# Patient Record
Sex: Female | Born: 1940 | Race: White | Hispanic: No | Smoking: Former smoker
Health system: Southern US, Community
[De-identification: ages and names within clinical notes are randomized; demographics above are authoritative.]

## PROBLEM LIST (undated history)

## (undated) DIAGNOSIS — F322 Major depressive disorder, single episode, severe without psychotic features: Secondary | ICD-10-CM

## (undated) DIAGNOSIS — T4145XA Adverse effect of unspecified anesthetic, initial encounter: Secondary | ICD-10-CM

## (undated) DIAGNOSIS — Z5189 Encounter for other specified aftercare: Secondary | ICD-10-CM

## (undated) DIAGNOSIS — M1990 Unspecified osteoarthritis, unspecified site: Secondary | ICD-10-CM

## (undated) DIAGNOSIS — K219 Gastro-esophageal reflux disease without esophagitis: Secondary | ICD-10-CM

## (undated) DIAGNOSIS — T8859XA Other complications of anesthesia, initial encounter: Secondary | ICD-10-CM

## (undated) DIAGNOSIS — IMO0001 Reserved for inherently not codable concepts without codable children: Secondary | ICD-10-CM

## (undated) DIAGNOSIS — G510 Bell's palsy: Secondary | ICD-10-CM

## (undated) DIAGNOSIS — K635 Polyp of colon: Secondary | ICD-10-CM

## (undated) DIAGNOSIS — T7840XA Allergy, unspecified, initial encounter: Secondary | ICD-10-CM

## (undated) DIAGNOSIS — E46 Unspecified protein-calorie malnutrition: Secondary | ICD-10-CM

## (undated) DIAGNOSIS — K5010 Crohn's disease of large intestine without complications: Secondary | ICD-10-CM

## (undated) DIAGNOSIS — H269 Unspecified cataract: Secondary | ICD-10-CM

## (undated) DIAGNOSIS — I10 Essential (primary) hypertension: Secondary | ICD-10-CM

## (undated) DIAGNOSIS — R0602 Shortness of breath: Secondary | ICD-10-CM

## (undated) DIAGNOSIS — H04123 Dry eye syndrome of bilateral lacrimal glands: Secondary | ICD-10-CM

## (undated) DIAGNOSIS — R51 Headache: Secondary | ICD-10-CM

## (undated) HISTORY — DX: Dry eye syndrome of bilateral lacrimal glands: H04.123

## (undated) HISTORY — PX: TONSILLECTOMY: SUR1361

## (undated) HISTORY — PX: FOOT GANGLION EXCISION: SHX1660

## (undated) HISTORY — DX: Unspecified cataract: H26.9

## (undated) HISTORY — PX: CATARACT EXTRACTION: SUR2

## (undated) HISTORY — PX: ABDOMINAL HYSTERECTOMY: SHX81

## (undated) HISTORY — DX: Gastro-esophageal reflux disease without esophagitis: K21.9

## (undated) HISTORY — DX: Bell's palsy: G51.0

## (undated) HISTORY — PX: CHOLECYSTECTOMY: SHX55

## (undated) HISTORY — DX: Allergy, unspecified, initial encounter: T78.40XA

## (undated) HISTORY — PX: COLONOSCOPY: SHX174

## (undated) HISTORY — DX: Crohn's disease of large intestine without complications: K50.10

## (undated) HISTORY — PX: SHOULDER SURGERY: SHX246

## (undated) HISTORY — DX: Encounter for other specified aftercare: Z51.89

## (undated) HISTORY — PX: APPENDECTOMY: SHX54

---
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
## 1998-01-14 ENCOUNTER — Other Ambulatory Visit: Admission: RE | Admit: 1998-01-14 | Discharge: 1998-01-14 | Payer: Self-pay

## 1999-07-12 ENCOUNTER — Encounter: Admission: RE | Admit: 1999-07-12 | Discharge: 1999-07-12 | Payer: Self-pay | Admitting: Otolaryngology

## 1999-07-12 ENCOUNTER — Encounter: Payer: Self-pay | Admitting: Otolaryngology

## 1999-07-13 ENCOUNTER — Encounter (INDEPENDENT_AMBULATORY_CARE_PROVIDER_SITE_OTHER): Payer: Self-pay | Admitting: *Deleted

## 1999-07-13 ENCOUNTER — Other Ambulatory Visit: Admission: RE | Admit: 1999-07-13 | Discharge: 1999-07-13 | Payer: Self-pay | Admitting: Otolaryngology

## 2001-11-21 ENCOUNTER — Encounter: Payer: Self-pay | Admitting: Interventional Cardiology

## 2001-11-21 ENCOUNTER — Inpatient Hospital Stay (HOSPITAL_COMMUNITY): Admission: EM | Admit: 2001-11-21 | Discharge: 2001-11-22 | Payer: Self-pay | Admitting: Emergency Medicine

## 2002-01-28 ENCOUNTER — Encounter: Payer: Self-pay | Admitting: Family Medicine

## 2002-01-28 ENCOUNTER — Encounter: Admission: RE | Admit: 2002-01-28 | Discharge: 2002-01-28 | Payer: Self-pay | Admitting: Family Medicine

## 2002-12-19 ENCOUNTER — Encounter: Admission: RE | Admit: 2002-12-19 | Discharge: 2002-12-19 | Payer: Self-pay | Admitting: Orthopedic Surgery

## 2002-12-19 ENCOUNTER — Encounter: Payer: Self-pay | Admitting: Orthopedic Surgery

## 2003-07-14 ENCOUNTER — Emergency Department (HOSPITAL_COMMUNITY): Admission: EM | Admit: 2003-07-14 | Discharge: 2003-07-14 | Payer: Self-pay | Admitting: Emergency Medicine

## 2003-09-15 ENCOUNTER — Encounter: Payer: Self-pay | Admitting: Cardiology

## 2003-09-15 ENCOUNTER — Ambulatory Visit (HOSPITAL_COMMUNITY): Admission: RE | Admit: 2003-09-15 | Discharge: 2003-09-15 | Payer: Self-pay | Admitting: Neurology

## 2003-09-29 ENCOUNTER — Ambulatory Visit (HOSPITAL_COMMUNITY): Admission: RE | Admit: 2003-09-29 | Discharge: 2003-09-29 | Payer: Self-pay | Admitting: Neurology

## 2004-09-29 ENCOUNTER — Emergency Department (HOSPITAL_COMMUNITY): Admission: EM | Admit: 2004-09-29 | Discharge: 2004-09-29 | Payer: Self-pay | Admitting: Emergency Medicine

## 2005-05-03 ENCOUNTER — Encounter: Admission: RE | Admit: 2005-05-03 | Discharge: 2005-05-03 | Payer: Self-pay | Admitting: Orthopedic Surgery

## 2005-05-17 ENCOUNTER — Encounter: Admission: RE | Admit: 2005-05-17 | Discharge: 2005-05-17 | Payer: Self-pay | Admitting: Orthopedic Surgery

## 2005-05-31 ENCOUNTER — Encounter: Admission: RE | Admit: 2005-05-31 | Discharge: 2005-05-31 | Payer: Self-pay | Admitting: Orthopedic Surgery

## 2006-01-04 ENCOUNTER — Emergency Department (HOSPITAL_COMMUNITY): Admission: EM | Admit: 2006-01-04 | Discharge: 2006-01-04 | Payer: Self-pay | Admitting: Emergency Medicine

## 2006-12-19 ENCOUNTER — Encounter: Admission: RE | Admit: 2006-12-19 | Discharge: 2006-12-19 | Payer: Self-pay | Admitting: Orthopedic Surgery

## 2007-01-02 ENCOUNTER — Encounter: Admission: RE | Admit: 2007-01-02 | Discharge: 2007-01-02 | Payer: Self-pay | Admitting: Orthopedic Surgery

## 2007-01-16 ENCOUNTER — Encounter: Admission: RE | Admit: 2007-01-16 | Discharge: 2007-01-16 | Payer: Self-pay | Admitting: Orthopedic Surgery

## 2008-06-26 ENCOUNTER — Encounter: Admission: RE | Admit: 2008-06-26 | Discharge: 2008-06-26 | Payer: Self-pay | Admitting: Orthopedic Surgery

## 2009-08-31 ENCOUNTER — Ambulatory Visit (HOSPITAL_COMMUNITY): Admission: RE | Admit: 2009-08-31 | Discharge: 2009-08-31 | Payer: Self-pay | Admitting: General Practice

## 2010-07-02 ENCOUNTER — Inpatient Hospital Stay (HOSPITAL_COMMUNITY)
Admission: EM | Admit: 2010-07-02 | Discharge: 2010-07-04 | Payer: Self-pay | Source: Home / Self Care | Attending: Internal Medicine | Admitting: Internal Medicine

## 2010-10-02 LAB — CBC
MCH: 30.9 pg (ref 26.0–34.0)
MCHC: 33.4 g/dL (ref 30.0–36.0)
MCHC: 34.4 g/dL (ref 30.0–36.0)
Platelets: 286 10*3/uL (ref 150–400)
Platelets: 299 10*3/uL (ref 150–400)
RDW: 12.4 % (ref 11.5–15.5)
RDW: 12.7 % (ref 11.5–15.5)
WBC: 6.9 10*3/uL (ref 4.0–10.5)

## 2010-10-02 LAB — COMPREHENSIVE METABOLIC PANEL
ALT: 66 U/L — ABNORMAL HIGH (ref 0–35)
AST: 46 U/L — ABNORMAL HIGH (ref 0–37)
AST: 59 U/L — ABNORMAL HIGH (ref 0–37)
Albumin: 3.1 g/dL — ABNORMAL LOW (ref 3.5–5.2)
Albumin: 3.6 g/dL (ref 3.5–5.2)
Alkaline Phosphatase: 78 U/L (ref 39–117)
BUN: 12 mg/dL (ref 6–23)
CO2: 24 mEq/L (ref 19–32)
Calcium: 8.9 mg/dL (ref 8.4–10.5)
Calcium: 9.6 mg/dL (ref 8.4–10.5)
Chloride: 96 mEq/L (ref 96–112)
Creatinine, Ser: 1.03 mg/dL (ref 0.4–1.2)
Creatinine, Ser: 1.07 mg/dL (ref 0.4–1.2)
GFR calc Af Amer: >60 mL/min (ref >60)
GFR calc Af Amer: >60 mL/min (ref >60)
GFR calc non Af Amer: 51 mL/min — ABNORMAL LOW (ref >60)
GFR calc non Af Amer: 53 mL/min — ABNORMAL LOW (ref >60)
Glucose, Bld: 106 mg/dL — ABNORMAL HIGH (ref 70–99)
Potassium: 3.8 mEq/L (ref 3.5–5.1)
Sodium: 130 mEq/L — ABNORMAL LOW (ref 135–145)
Total Bilirubin: 0.7 mg/dL (ref 0.3–1.2)
Total Protein: 6.2 g/dL (ref 6.0–8.3)
Total Protein: 7 g/dL (ref 6.0–8.3)

## 2010-10-02 LAB — CARDIAC PANEL(CRET KIN+CKTOT+MB+TROPI)
CK, MB: 0.8 ng/mL (ref 0.3–4.0)
Relative Index: 0.2 (ref 0.0–2.5)
Relative Index: 0.2 (ref 0.0–2.5)
Total CK: 437 U/L — ABNORMAL HIGH (ref 7–177)
Total CK: 462 U/L — ABNORMAL HIGH (ref 7–177)
Troponin I: 0.02 ng/mL (ref 0.00–0.06)
Troponin I: 0.03 ng/mL (ref 0.00–0.06)

## 2010-10-02 LAB — BASIC METABOLIC PANEL
BUN: 10 mg/dL (ref 6–23)
Chloride: 98 mEq/L (ref 96–112)
GFR calc non Af Amer: 60 mL/min — ABNORMAL LOW (ref >60)
Potassium: 3.3 mEq/L — ABNORMAL LOW (ref 3.5–5.1)
Sodium: 131 mEq/L — ABNORMAL LOW (ref 135–145)

## 2010-10-02 LAB — SYNOVIAL CELL COUNT + DIFF, W/ CRYSTALS
Crystals, Fluid: NONE SEEN
Eosinophils-Synovial: 0 % (ref 0–1)
Lymphocytes-Synovial Fld: 1 % (ref 0–20)
Monocyte-Macrophage-Synovial Fluid: 26 % — ABNORMAL LOW (ref 50–90)
WBC, Synovial: 2170 /mm3 — ABNORMAL HIGH (ref 0–200)

## 2010-10-02 LAB — D-DIMER, QUANTITATIVE: D-Dimer, Quant: 0.39 ug/mL-FEU (ref 0.00–0.48)

## 2010-10-02 LAB — CULTURE, BLOOD (ROUTINE X 2): Culture: NO GROWTH

## 2010-10-02 LAB — CK TOTAL AND CKMB (NOT AT ARMC)
CK, MB: 0.8 ng/mL (ref 0.3–4.0)
Relative Index: 0.2 (ref 0.0–2.5)
Total CK: 488 U/L — ABNORMAL HIGH (ref 7–177)

## 2010-10-02 LAB — BODY FLUID CULTURE

## 2010-10-02 LAB — BRAIN NATRIURETIC PEPTIDE: Pro B Natriuretic peptide (BNP): 37 pg/mL (ref 0.0–100.0)

## 2010-10-02 LAB — POCT CARDIAC MARKERS
CKMB, poc: <1 ng/mL — ABNORMAL LOW (ref 1.0–8.0)
Myoglobin, poc: 59.9 ng/mL (ref 12–200)
Troponin i, poc: <0.05 ng/mL (ref 0.00–0.09)

## 2010-10-02 LAB — HEPATITIS B DNA, ULTRAQUANTITATIVE, PCR

## 2010-10-02 LAB — ANAEROBIC CULTURE

## 2010-10-02 LAB — TSH: TSH: 1.049 u[IU]/mL (ref 0.350–4.500)

## 2010-10-02 LAB — HEMOGLOBIN A1C
Hgb A1c MFr Bld: 6 % — ABNORMAL HIGH (ref <5.7)
Mean Plasma Glucose: 126 mg/dL — ABNORMAL HIGH (ref <117)

## 2010-10-02 LAB — GRAM STAIN

## 2010-10-02 LAB — DIFFERENTIAL
Basophils Absolute: 0 10*3/uL (ref 0.0–0.1)
Basophils Relative: 0 % (ref 0–1)
Lymphocytes Relative: 24 % (ref 12–46)
Monocytes Absolute: 0.9 10*3/uL (ref 0.1–1.0)
Neutro Abs: 4.2 10*3/uL (ref 1.7–7.7)
Neutrophils Relative %: 61 % (ref 43–77)

## 2010-10-02 LAB — HEPATITIS PANEL, ACUTE
HCV Ab: NEGATIVE
Hep A IgM: NEGATIVE

## 2010-10-02 LAB — LIPID PANEL
Triglycerides: 86 mg/dL (ref <150)
VLDL: 17 mg/dL (ref 0–40)

## 2010-10-02 LAB — PROTIME-INR
INR: 0.99 (ref 0.00–1.49)
Prothrombin Time: 13.3 seconds (ref 11.6–15.2)

## 2010-10-02 LAB — TROPONIN I: Troponin I: 0.01 ng/mL (ref 0.00–0.06)

## 2010-10-02 LAB — LIPASE, BLOOD: Lipase: 39 U/L (ref 11–59)

## 2010-10-02 LAB — APTT: aPTT: 31 seconds (ref 24–37)

## 2010-12-08 NOTE — H&P (Signed)
[place]  Patient:    [name] [name], [name] [name] Visit Number: [id number] MRN: [id number]          Service Type: MED Location: [id number] Attending Physician:  Rolland Porter Dictated by:   Illene Labrador, M.D. Admit Date:  11/21/2001   CC:         Gus Height, M.D.   History and Physical  INDICATIONS:  Chest pain.  SUBJECTIVE:  The patient is a 70 year old who is followed at [facility] by Dr. Sheryn Bison.  She was seen today by Dr. Melinda Crutch.  The patient has a history of recurrent chest and neck discomfort over the past 10 years.  She states that she gets episodes of severe substernal tightness two to four times per year.  The episode today started in the early morning and lasted until around 4 p.m., when she saw Dr. Harrington Challenger.  After being seen by Dr. Harrington Challenger an EKG was done and was normal.  Because of elevated blood pressure and continued chest pain, nitroglycerin was given and there was some lessening of the discomfort.  She was sent to the emergency room for further evaluation. She is now pain-free.  Repeat EKG remains normal.  ALLERGIES:  CODEINE causes nausea.  PAST MEDICAL HISTORY: 1. Arthritis of uncertain type. 2. No other significant medical problems.  She specifically denies diabetes, hypertension.  FAMILY HISTORY:  Father died of a myocardial infarction at age 19.  She has a younger brother who has suffered a myocardial infarction.  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 She has one son.  HABITS:  She does not smoke or drink.  MEDICATIONS:  Celebrex for arthritis - she does not remember the dose.  She is on no other medication other than vitamins.  REVIEW OF SYSTEMS:  She denies back discomfort.  There is no history of neurological ailments.  She denies intestinal problems.  No diagnosis of gastroesophageal reflux.  She has never had cholecystectomy.  OBJECTIVE:  GENERAL:   On exam, the patient is in no distress.  She is sitting comfortably on the gurney.  Her skin color is good.  VITAL SIGNS:  Respirations 16, heart rate 60.  Blood pressure initially 210/100; subsequently her blood pressure is 170/92.  HEENT:  Reveals no xanthelasma.  Extraocular movements are full.  No jaundice.  NECK:  Reveals no JVD, carotid bruits, or thyromegaly.  CHEST:  Clear to auscultation and percussion.  CARDIAC:  Reveals an S4 gallop but otherwise unremarkable.  No murmur is heard.  The precordium is relatively quiet.  ABDOMEN:  Soft.  The liver edge is not palpable.  No pulsatile masses are noted.  Bowel sounds are normal.  EXTREMITIES:  Reveal no edema.  Radial pulses, femoral pulses, and posterior tibial pulses are symmetric and 2+.  There is no peripheral edema.  NEUROLOGIC:  Reveals the patient is alert and oriented x3.  No motor or sensory deficits noted.  No obvious cranial nerves abnormalities are noted.  LABORATORY DATA:  EKG reveals an incomplete right bundle-branch block, normal sinus rhythm.  This pattern is present on both the [facility] ER tracings.  Chest x-ray reveals upper normal heart size but is otherwise unremarkable.  No laboratory data is back at this time.  ASSESSMENT: 1. Recurrent chest pain, frequently occurring with anxiety and stress.  This    discomfort is not precipitated by physical activity.  Etiology is    uncertain.  Myocardial infarction will be ruled out.  Given the chronicity    and the recurrent nature of this discomfort I doubt that this is cardiac. 2. Hypertension is present on admission.  This needs to be further evaluated.  PLAN: 1. Lopressor 25 mg p.o. b.i.d. 2. Lovenox subcu. 3. Serial enzymes and EKGs, rule out infarction. 4. Based upon blood pressure recordings, further adjustments and medications;    perhaps adding a low-dose diuretic will be necessary. 5. Once blood pressure controlled and if  the patient rules out for myocardial    infarction, she will likely be discharged and have an outpatient stress    test. Dictated by:   Illene Labrador, M.D. Attending Physician:  Rolland Porter DD:  11/21/01 TD:  11/21/01 Job: [id number]

## 2011-06-01 ENCOUNTER — Encounter: Payer: Self-pay | Admitting: Emergency Medicine

## 2011-06-01 ENCOUNTER — Emergency Department (HOSPITAL_COMMUNITY)
Admission: EM | Admit: 2011-06-01 | Discharge: 2011-06-01 | Disposition: A | Discharge status: Home / Self Care | Payer: Medicare Other | Attending: Emergency Medicine | Admitting: Emergency Medicine

## 2011-06-01 DIAGNOSIS — K5090 Crohn's disease, unspecified, without complications: Secondary | ICD-10-CM | POA: Insufficient documentation

## 2011-06-01 DIAGNOSIS — R197 Diarrhea, unspecified: Secondary | ICD-10-CM | POA: Insufficient documentation

## 2011-06-01 DIAGNOSIS — R12 Heartburn: Secondary | ICD-10-CM | POA: Insufficient documentation

## 2011-06-01 DIAGNOSIS — K859 Acute pancreatitis without necrosis or infection, unspecified: Secondary | ICD-10-CM

## 2011-06-01 DIAGNOSIS — R1013 Epigastric pain: Secondary | ICD-10-CM | POA: Insufficient documentation

## 2011-06-01 LAB — URINALYSIS, ROUTINE W REFLEX MICROSCOPIC
Bilirubin Urine: NEGATIVE
Glucose, UA: NEGATIVE mg/dL
Ketones, ur: NEGATIVE mg/dL
Nitrite: NEGATIVE
Specific Gravity, Urine: 1.008 (ref 1.005–1.030)
pH: 6.5 (ref 5.0–8.0)

## 2011-06-01 LAB — COMPREHENSIVE METABOLIC PANEL
ALT: 19 U/L (ref 0–35)
AST: 12 U/L (ref 0–37)
Albumin: 2.8 g/dL — ABNORMAL LOW (ref 3.5–5.2)
Alkaline Phosphatase: 46 U/L (ref 39–117)
Chloride: 100 mEq/L (ref 96–112)
Potassium: 3.1 mEq/L — ABNORMAL LOW (ref 3.5–5.1)
Sodium: 132 mEq/L — ABNORMAL LOW (ref 135–145)
Total Bilirubin: 0.3 mg/dL (ref 0.3–1.2)
Total Protein: 5.9 g/dL — ABNORMAL LOW (ref 6.0–8.3)

## 2011-06-01 LAB — DIFFERENTIAL
Basophils Absolute: 0 10*3/uL (ref 0.0–0.1)
Eosinophils Absolute: 0 10*3/uL (ref 0.0–0.7)
Lymphocytes Relative: 4 % — ABNORMAL LOW (ref 12–46)
Monocytes Relative: 6 % (ref 3–12)
Neutro Abs: 14.5 10*3/uL — ABNORMAL HIGH (ref 1.7–7.7)
Neutrophils Relative %: 90 % — ABNORMAL HIGH (ref 43–77)
WBC Morphology: INCREASED

## 2011-06-01 LAB — CBC
HCT: 37.8 % (ref 36.0–46.0)
MCHC: 34.4 g/dL (ref 30.0–36.0)
Platelets: 495 10*3/uL — ABNORMAL HIGH (ref 150–400)
RDW: 14 % (ref 11.5–15.5)
WBC: 16.1 10*3/uL — ABNORMAL HIGH (ref 4.0–10.5)

## 2011-06-01 LAB — URINE MICROSCOPIC-ADD ON

## 2011-06-01 LAB — LACTIC ACID, PLASMA: Lactic Acid, Venous: 1.4 mmol/L (ref 0.5–2.2)

## 2011-06-01 MED ORDER — OXYCODONE-ACETAMINOPHEN 5-325 MG PO TABS
1.0000 | ORAL_TABLET | Freq: Once | ORAL | Status: AC
  Administered 2011-06-01: 1 via ORAL
  Filled 2011-06-01: qty 1

## 2011-06-01 MED ORDER — POTASSIUM CHLORIDE CRYS ER 20 MEQ PO TBCR
40.0000 meq | EXTENDED_RELEASE_TABLET | Freq: Two times a day (BID) | ORAL | Status: DC
  Administered 2011-06-01: 40 meq via ORAL
  Filled 2011-06-01: qty 2

## 2011-06-01 MED ORDER — ONDANSETRON HCL 4 MG/2ML IJ SOLN
4.0000 mg | Freq: Once | INTRAMUSCULAR | Status: AC
  Administered 2011-06-01: 4 mg via INTRAVENOUS
  Filled 2011-06-01: qty 2

## 2011-06-01 MED ORDER — OXYCODONE-ACETAMINOPHEN 5-325 MG PO TABS: 1.0000 | ORAL_TABLET | ORAL | Status: DC | PRN

## 2011-06-01 MED ORDER — PANTOPRAZOLE SODIUM 40 MG IV SOLR
40.0000 mg | Freq: Once | INTRAVENOUS | Status: AC
  Administered 2011-06-01: 40 mg via INTRAVENOUS
  Filled 2011-06-01: qty 40

## 2011-06-01 MED ORDER — MORPHINE SULFATE 2 MG/ML IJ SOLN
2.0000 mg | Freq: Once | INTRAMUSCULAR | Status: AC
  Administered 2011-06-01: 2 mg via INTRAVENOUS
  Filled 2011-06-01: qty 1

## 2011-06-01 MED ORDER — POTASSIUM CHLORIDE 20 MEQ PO PACK: 40.0000 meq | PACK | Freq: Two times a day (BID) | ORAL | Status: DC

## 2011-06-01 MED ORDER — ONDANSETRON HCL 4 MG PO TABS: 4.0000 mg | ORAL_TABLET | Freq: Four times a day (QID) | ORAL | Status: DC

## 2011-06-01 MED ORDER — SODIUM CHLORIDE 0.9 % IV BOLUS (SEPSIS)
1000.0000 mL | Freq: Once | INTRAVENOUS | Status: AC
Start: 2011-06-01 — End: 2011-06-01
  Administered 2011-06-01: 1000 mL via INTRAVENOUS

## 2011-06-01 NOTE — ED Provider Notes (Signed)
History     CSN: [id number] Arrival date & time: 06/01/2011  6:59 PM   First MD Initiated Contact with Patient 06/01/11 1912      Chief Complaint  Patient presents with  . Abdominal Pain    (Consider location/radiation/quality/duration/timing/severity/associated sxs/prior treatment) Patient is a 70 y.o. female presenting with abdominal pain. The history is provided by the patient.  Abdominal Pain The primary symptoms of the illness include abdominal pain. The current episode started yesterday. The onset of the illness was gradual. The problem has been gradually worsening.  The abdominal pain began yesterday. The pain came on gradually. The abdominal pain has been gradually worsening since its onset. The abdominal pain is located in the epigastric region. The abdominal pain does not radiate. The abdominal pain is relieved by nothing. The abdominal pain is exacerbated by eating and movement.  Additional symptoms associated with the illness include heartburn. Symptoms associated with the illness do not include chills, constipation, urgency, hematuria or frequency.    Past Medical History  Diagnosis Date  . Crohn's     No past surgical history on file.  No family history on file.  History  Substance Use Topics  . Smoking status: Not on file  . Smokeless tobacco: Not on file  . Alcohol Use:     OB History    Grav Para Term Preterm Abortions TAB SAB Ect Mult Living                  Review of Systems  Constitutional: Negative for chills.  Gastrointestinal: Positive for heartburn and abdominal pain. Negative for constipation.  Genitourinary: Negative for urgency, frequency and hematuria.  All other systems reviewed and are negative.    Allergies  Sulfa antibiotics  Home Medications  No current outpatient prescriptions on file.  SpO2 98%  Physical Exam  Nursing note and vitals reviewed. Constitutional: She is oriented to person, place, and time. She appears  well-developed and well-nourished.  HENT:  Head: Normocephalic and atraumatic.  Eyes: Conjunctivae are normal. Pupils are equal, round, and reactive to light.  Neck: Normal range of motion. Neck supple.  Cardiovascular: Normal rate and regular rhythm.   Pulmonary/Chest: Effort normal and breath sounds normal. She exhibits no tenderness.  Abdominal: Soft. There is tenderness (Mild TTP of epigastric area).  Musculoskeletal: Normal range of motion.  Neurological: She is alert and oriented to person, place, and time. No cranial nerve deficit.  Skin: Skin is warm and dry.    ED Course  Procedures (including critical care time)  Labs Reviewed  CBC - Abnormal; Notable for the following:    WBC 16.1 (*)    Platelets 495 (*)    All other components within normal limits  DIFFERENTIAL - Abnormal; Notable for the following:    Neutrophils Relative 90 (*)    Lymphocytes Relative 4 (*)    Neutro Abs 14.5 (*)    Lymphs Abs 0.6 (*)    All other components within normal limits  COMPREHENSIVE METABOLIC PANEL - Abnormal; Notable for the following:    Sodium 132 (*)    Potassium 3.1 (*)    Glucose, Bld 114 (*)    Total Protein 5.9 (*)    Albumin 2.8 (*)    GFR calc non Af Amer 86 (*)    All other components within normal limits  URINALYSIS, ROUTINE W REFLEX MICROSCOPIC - Abnormal; Notable for the following:    Leukocytes, UA TRACE (*)    All other components within normal  limits  LIPASE, BLOOD - Abnormal; Notable for the following:    Lipase 102 (*)    All other components within normal limits  LACTIC ACID, PLASMA  URINE MICROSCOPIC-ADD ON  CLOSTRIDIUM DIFFICILE BY PCR   No results found.   1. Pancreatitis      Date: 06/01/2011  Rate:53  Rhythm: sinus bradycardia  QRS Axis: normal  Intervals: normal  ST/T Wave abnormalities: normal  Conduction Disutrbances:none  Narrative Interpretation:   Old EKG Reviewed: unchanged    MDM  Pt presented due to epigastric pain burning  sensation, intermittently sharp.  She is well appearing, nausea no vomiting, reports chronic diarrhea.  Labs showed mild pancreatitis.  She is tolerating PO at home.  She no longer has GB doubt stone based on exam.  Given protonix and morphine, IVF.  Discussed with patient about what she wanted to do.  She states she prefers to go home to try outpt treatment.          Molli Posey, MD 06/01/11 2123

## 2011-06-01 NOTE — ED Notes (Signed)
Patient with history of chron's and abdominal problems.  Patient having abdominal pain, recurrent for last two weeks.

## 2011-06-01 NOTE — ED Notes (Signed)
Patient inpatient at [facility] last week with the same.  Patient states she was feeling better earlier in the week, now progressively getting worse with pain.

## 2011-06-01 NOTE — ED Notes (Signed)
Per EMS:  Pt here with c/o epigastric pain that started 2 weeks ago.  Pt has hx of Crohn's.  Pt denies n/v, reports pain was constant cramping pain and rates it 9/10.

## 2011-06-01 NOTE — ED Provider Notes (Signed)
I saw and evaluated the patient, reviewed the resident's note and I agree with the findings and plan.  Patient has history of Crohns' disease and recently had ct done which was okay.  Abd pain flared up again.  Abdomen is ttp in all quadrants with no rebound or guarding.  Feels better with fluids, meds.  The wbc was elevated, but likely due to steroid use.    Veryl Speak, MD 06/01/11 [phone]

## 2011-06-01 NOTE — ED Notes (Signed)
Pt here with c/o 3 weeks of abd pain and diarrhea which she reports is just like her normal Crohns symptoms.  Pt rates pain 9/10.  Pt denies n/v.  Pt alert and oriented x4 and in NAD.

## 2011-06-05 ENCOUNTER — Encounter (HOSPITAL_COMMUNITY): Payer: Self-pay | Admitting: Emergency Medicine

## 2011-06-05 ENCOUNTER — Inpatient Hospital Stay (HOSPITAL_COMMUNITY)
Admission: EM | Admit: 2011-06-05 | Discharge: 2011-06-13 | DRG: 386 | Disposition: A | Discharge status: Home / Self Care | Payer: Medicare Other | Source: Ambulatory Visit | Attending: Internal Medicine | Admitting: Internal Medicine

## 2011-06-05 DIAGNOSIS — E876 Hypokalemia: Secondary | ICD-10-CM

## 2011-06-05 DIAGNOSIS — M1990 Unspecified osteoarthritis, unspecified site: Secondary | ICD-10-CM

## 2011-06-05 DIAGNOSIS — N179 Acute kidney failure, unspecified: Secondary | ICD-10-CM

## 2011-06-05 DIAGNOSIS — K50111 Crohn's disease of large intestine with rectal bleeding: Secondary | ICD-10-CM | POA: Diagnosis present

## 2011-06-05 DIAGNOSIS — K529 Noninfective gastroenteritis and colitis, unspecified: Secondary | ICD-10-CM

## 2011-06-05 DIAGNOSIS — I10 Essential (primary) hypertension: Secondary | ICD-10-CM

## 2011-06-05 DIAGNOSIS — K5010 Crohn's disease of large intestine without complications: Principal | ICD-10-CM | POA: Diagnosis present

## 2011-06-05 DIAGNOSIS — R197 Diarrhea, unspecified: Secondary | ICD-10-CM

## 2011-06-05 DIAGNOSIS — K5100 Ulcerative (chronic) pancolitis without complications: Secondary | ICD-10-CM

## 2011-06-05 DIAGNOSIS — K5090 Crohn's disease, unspecified, without complications: Secondary | ICD-10-CM

## 2011-06-05 DIAGNOSIS — N390 Urinary tract infection, site not specified: Secondary | ICD-10-CM | POA: Diagnosis present

## 2011-06-05 DIAGNOSIS — D72829 Elevated white blood cell count, unspecified: Secondary | ICD-10-CM | POA: Diagnosis present

## 2011-06-05 HISTORY — DX: Shortness of breath: R06.02

## 2011-06-05 HISTORY — DX: Other complications of anesthesia, initial encounter: T88.59XA

## 2011-06-05 HISTORY — DX: Headache: R51

## 2011-06-05 HISTORY — DX: Adverse effect of unspecified anesthetic, initial encounter: T41.45XA

## 2011-06-05 HISTORY — DX: Unspecified osteoarthritis, unspecified site: M19.90

## 2011-06-05 HISTORY — DX: Encounter for other specified aftercare: Z51.89

## 2011-06-05 HISTORY — DX: Essential (primary) hypertension: I10

## 2011-06-05 HISTORY — DX: Reserved for inherently not codable concepts without codable children: IMO0001

## 2011-06-05 HISTORY — DX: Polyp of colon: K63.5

## 2011-06-05 LAB — POCT I-STAT, CHEM 8
Calcium, Ion: 1.13 mmol/L (ref 1.12–1.32)
HCT: 44 % (ref 36.0–46.0)
Sodium: 134 mEq/L — ABNORMAL LOW (ref 135–145)
TCO2: 21 mmol/L (ref 0–100)

## 2011-06-05 LAB — CBC
MCH: 32 pg (ref 26.0–34.0)
MCV: 92 fL (ref 78.0–100.0)
Platelets: 513 10*3/uL — ABNORMAL HIGH (ref 150–400)
RBC: 4.4 MIL/uL (ref 3.87–5.11)
RDW: 14.6 % (ref 11.5–15.5)
WBC: 18.6 10*3/uL — ABNORMAL HIGH (ref 4.0–10.5)

## 2011-06-05 MED ORDER — HYDROMORPHONE HCL PF 1 MG/ML IJ SOLN
1.0000 mg | Freq: Once | INTRAMUSCULAR | Status: AC
  Administered 2011-06-05: 1 mg via INTRAVENOUS
  Filled 2011-06-05: qty 1

## 2011-06-05 MED ORDER — ONDANSETRON HCL 4 MG/2ML IJ SOLN
4.0000 mg | Freq: Once | INTRAMUSCULAR | Status: AC
  Administered 2011-06-05: 4 mg via INTRAVENOUS

## 2011-06-05 MED ORDER — ONDANSETRON HCL 4 MG/2ML IJ SOLN
INTRAMUSCULAR | Status: AC
  Filled 2011-06-05: qty 2

## 2011-06-05 MED ORDER — SODIUM CHLORIDE 0.9 % IV SOLN
Freq: Once | INTRAVENOUS | Status: AC
  Administered 2011-06-05: 22:00:00 via INTRAVENOUS

## 2011-06-05 NOTE — ED Notes (Signed)
Pt presents to department for evaluation of diffuse abdominal pain, nausea and generalized weakness. Ongoing x4 weeks. Pt was recently admitted to [facility] for same. States pain 9/10. Diffuse tenderness to abdomen. Bowel sounds present all quadrants. Pt alert and oriented x4. No signs of distress at the time.

## 2011-06-05 NOTE — ED Provider Notes (Signed)
I received a call from the patient's PCP Dr. Melina Copa. [phone]. The patient has a pancolitis with a history of Crohn's disease. She's recently and [facility]. The primary thinks the patient needs to be admitted. He tended to limit the patient appeared to Dr. Elicia Lamp, from about her GI. Our would not admit primarily, and requested patient to the ER and get a hospitalist admission. Patient has a workup with her. The doctor like call if they have any questions.  [place], [place] 06/06/11 [phone]

## 2011-06-05 NOTE — ED Provider Notes (Signed)
Patient sent by gastroenterologist for admission.  Patient with known pancolitis (CT 05/25/11), with worsening abdominal pain, diarrhea, and hematochezia x several weeks.  Has been admitted to [facility] and is currently taking metronidazole without improvement.  Yesterday was diagnosed with UTI and is taking ciprofloxacin for this.  Dr Melina Copa is patient's GI doctor in [place], who has contacted Dr Elicia Lamp of [place] who has agreed to consult on patient with hospitalist admission.    PMH significant for crohn's colitis, HTN, chronic headaches, GERD, rectal bleeding  PSH right shoulder surgery, hysterectomy, appendectomy, cholecystectomy, skin lesion removal, cyst removal from left foot.   ROS is negative with exception of fatigue and burning with urination.   On exam, patient is alert and oriented x4, NAD, uncomfortable appearing, RRR, no murmurs, rubs, gallops, lungs CTAB, abd soft, nondistended, diffusely tender to palpation, worse in LLQ, no guarding no rebound.   ED course: I have spoken with [facility], who will admit the patient.  Diagnosis: Crohn's disease, pancolitis.     Otis Brace, PA 06/06/11 [place], [place] 06/06/11 [phone]

## 2011-06-05 NOTE — ED Notes (Signed)
Pt resting quietly at the time. Vital signs stable. Family remains at bedside.

## 2011-06-05 NOTE — ED Notes (Signed)
Pt denies pain at the time. States relief with pain medication. Resting quietly at the time. Vital signs stable.

## 2011-06-05 NOTE — ED Notes (Signed)
Had had pain for over 4 weeks, was seen Friday night for same, was sent home, was instructed to F/U with private MD and liquid diet, was referred back by private MD

## 2011-06-06 ENCOUNTER — Encounter (HOSPITAL_COMMUNITY): Payer: Self-pay | Admitting: *Deleted

## 2011-06-06 DIAGNOSIS — N179 Acute kidney failure, unspecified: Secondary | ICD-10-CM | POA: Diagnosis present

## 2011-06-06 DIAGNOSIS — E876 Hypokalemia: Secondary | ICD-10-CM | POA: Diagnosis present

## 2011-06-06 DIAGNOSIS — R197 Diarrhea, unspecified: Secondary | ICD-10-CM | POA: Diagnosis present

## 2011-06-06 LAB — CBC
HCT: 41.1 % (ref 36.0–46.0)
Hemoglobin: 13.8 g/dL (ref 12.0–15.0)
MCH: 31.3 pg (ref 26.0–34.0)
MCV: 92.6 fL (ref 78.0–100.0)
MCV: 93 fL (ref 78.0–100.0)
Platelets: 481 10*3/uL — ABNORMAL HIGH (ref 150–400)
RBC: 4.44 MIL/uL (ref 3.87–5.11)
RDW: 14.6 % (ref 11.5–15.5)
RDW: 14.8 % (ref 11.5–15.5)
WBC: 23.3 10*3/uL — ABNORMAL HIGH (ref 4.0–10.5)

## 2011-06-06 LAB — CREATININE, SERUM
GFR calc Af Amer: 68 mL/min — ABNORMAL LOW (ref >90)
GFR calc non Af Amer: 58 mL/min — ABNORMAL LOW (ref >90)

## 2011-06-06 LAB — COMPREHENSIVE METABOLIC PANEL
AST: 10 U/L (ref 0–37)
Albumin: 2.5 g/dL — ABNORMAL LOW (ref 3.5–5.2)
Alkaline Phosphatase: 45 U/L (ref 39–117)
BUN: 16 mg/dL (ref 6–23)
CO2: 19 mEq/L (ref 19–32)
Chloride: 104 mEq/L (ref 96–112)
GFR calc non Af Amer: 65 mL/min — ABNORMAL LOW (ref >90)
Potassium: 3 mEq/L — ABNORMAL LOW (ref 3.5–5.1)
Total Bilirubin: 0.4 mg/dL (ref 0.3–1.2)

## 2011-06-06 LAB — MAGNESIUM: Magnesium: 2 mg/dL (ref 1.5–2.5)

## 2011-06-06 MED ORDER — ONDANSETRON HCL 4 MG PO TABS
4.0000 mg | ORAL_TABLET | Freq: Four times a day (QID) | ORAL | Status: DC | PRN
  Administered 2011-06-06: 4 mg via ORAL
  Filled 2011-06-06: qty 1

## 2011-06-06 MED ORDER — METHYLPREDNISOLONE SODIUM SUCC 40 MG IJ SOLR
40.0000 mg | Freq: Two times a day (BID) | INTRAMUSCULAR | Status: DC
  Administered 2011-06-06 – 2011-06-07 (×2): 40 mg via INTRAVENOUS
  Filled 2011-06-06 (×4): qty 1

## 2011-06-06 MED ORDER — CIPROFLOXACIN HCL 500 MG PO TABS
500.0000 mg | ORAL_TABLET | Freq: Two times a day (BID) | ORAL | Status: DC
  Administered 2011-06-06: 500 mg via ORAL
  Filled 2011-06-06 (×6): qty 1

## 2011-06-06 MED ORDER — ACETAMINOPHEN 650 MG RE SUPP: 650.0000 mg | Freq: Four times a day (QID) | RECTAL | Status: DC | PRN

## 2011-06-06 MED ORDER — CHLORHEXIDINE GLUCONATE 0.12 % MT SOLN
15.0000 mL | Freq: Two times a day (BID) | OROMUCOSAL | Status: DC
  Administered 2011-06-06 – 2011-06-08 (×5): 15 mL via OROMUCOSAL
  Filled 2011-06-06 (×5): qty 15

## 2011-06-06 MED ORDER — BIOTENE DRY MOUTH MT LIQD
15.0000 mL | Freq: Two times a day (BID) | OROMUCOSAL | Status: DC
  Administered 2011-06-06 – 2011-06-07 (×4): 15 mL via OROMUCOSAL

## 2011-06-06 MED ORDER — CIPROFLOXACIN IN D5W 400 MG/200ML IV SOLN
400.0000 mg | Freq: Two times a day (BID) | INTRAVENOUS | Status: DC
  Administered 2011-06-06 – 2011-06-08 (×4): 400 mg via INTRAVENOUS
  Filled 2011-06-06 (×4): qty 200

## 2011-06-06 MED ORDER — METRONIDAZOLE IN NACL 5-0.79 MG/ML-% IV SOLN
500.0000 mg | Freq: Three times a day (TID) | INTRAVENOUS | Status: DC
  Administered 2011-06-06 – 2011-06-07 (×3): 500 mg via INTRAVENOUS
  Filled 2011-06-06 (×3): qty 100

## 2011-06-06 MED ORDER — ONDANSETRON HCL 4 MG/2ML IJ SOLN
4.0000 mg | INTRAMUSCULAR | Status: DC | PRN
  Administered 2011-06-07: 4 mg via INTRAVENOUS
  Filled 2011-06-06: qty 2

## 2011-06-06 MED ORDER — ACETAMINOPHEN 325 MG PO TABS
650.0000 mg | ORAL_TABLET | Freq: Four times a day (QID) | ORAL | Status: DC | PRN
  Administered 2011-06-06: 650 mg via ORAL
  Filled 2011-06-06: qty 2

## 2011-06-06 MED ORDER — HEPARIN SODIUM (PORCINE) 5000 UNIT/ML IJ SOLN
5000.0000 [IU] | Freq: Three times a day (TID) | INTRAMUSCULAR | Status: DC
  Administered 2011-06-06 – 2011-06-13 (×23): 5000 [IU] via SUBCUTANEOUS
  Filled 2011-06-06 (×26): qty 1

## 2011-06-06 MED ORDER — PANTOPRAZOLE SODIUM 40 MG IV SOLR
40.0000 mg | INTRAVENOUS | Status: DC
  Administered 2011-06-06 – 2011-06-08 (×3): 40 mg via INTRAVENOUS
  Filled 2011-06-06 (×4): qty 40

## 2011-06-06 MED ORDER — PANTOPRAZOLE SODIUM 40 MG PO TBEC: 40.0000 mg | DELAYED_RELEASE_TABLET | Freq: Every day | ORAL | Status: DC

## 2011-06-06 MED ORDER — ONDANSETRON HCL 4 MG/2ML IJ SOLN
INTRAMUSCULAR | Status: AC
  Administered 2011-06-06: 4 mg via INTRAVENOUS
  Filled 2011-06-06: qty 2

## 2011-06-06 MED ORDER — PREDNISONE 10 MG PO TABS
10.0000 mg | ORAL_TABLET | Freq: Every day | ORAL | Status: DC
  Filled 2011-06-06: qty 1

## 2011-06-06 MED ORDER — OXYCODONE HCL 5 MG PO TABS
5.0000 mg | ORAL_TABLET | ORAL | Status: DC | PRN
  Administered 2011-06-09 – 2011-06-11 (×5): 5 mg via ORAL
  Filled 2011-06-06 (×5): qty 1

## 2011-06-06 MED ORDER — METRONIDAZOLE 500 MG PO TABS
500.0000 mg | ORAL_TABLET | Freq: Three times a day (TID) | ORAL | Status: DC
  Administered 2011-06-06: 500 mg via ORAL
  Filled 2011-06-06 (×3): qty 1

## 2011-06-06 MED ORDER — SODIUM CHLORIDE 0.9 % IV SOLN
INTRAVENOUS | Status: DC
  Administered 2011-06-06 – 2011-06-09 (×7): via INTRAVENOUS

## 2011-06-06 MED ORDER — CHOLESTYRAMINE 4 G PO PACK
4.0000 g | PACK | Freq: Two times a day (BID) | ORAL | Status: DC
  Administered 2011-06-06 – 2011-06-07 (×4): 4 g via ORAL
  Filled 2011-06-06 (×8): qty 1

## 2011-06-06 MED ORDER — POTASSIUM CHLORIDE CRYS ER 20 MEQ PO TBCR
40.0000 meq | EXTENDED_RELEASE_TABLET | Freq: Every day | ORAL | Status: DC
  Filled 2011-06-06: qty 2

## 2011-06-06 MED ORDER — MORPHINE SULFATE 2 MG/ML IJ SOLN
2.0000 mg | INTRAMUSCULAR | Status: DC | PRN
  Administered 2011-06-06 (×3): 2 mg via INTRAVENOUS
  Administered 2011-06-06: 4 mg via INTRAVENOUS
  Administered 2011-06-07 (×2): 2 mg via INTRAVENOUS
  Filled 2011-06-06: qty 1
  Filled 2011-06-06: qty 2
  Filled 2011-06-06 (×4): qty 1

## 2011-06-06 MED ORDER — ONDANSETRON HCL 4 MG/2ML IJ SOLN
4.0000 mg | Freq: Four times a day (QID) | INTRAMUSCULAR | Status: DC | PRN
  Administered 2011-06-06 (×2): 4 mg via INTRAVENOUS
  Filled 2011-06-06: qty 2

## 2011-06-06 MED ORDER — MESALAMINE 1.2 G PO TBEC
1200.0000 mg | DELAYED_RELEASE_TABLET | Freq: Every day | ORAL | Status: DC
  Administered 2011-06-06 – 2011-06-09 (×4): 1.2 g via ORAL
  Filled 2011-06-06 (×7): qty 1

## 2011-06-06 MED ORDER — POTASSIUM CHLORIDE 10 MEQ/100ML IV SOLN
10.0000 meq | INTRAVENOUS | Status: AC
  Administered 2011-06-06 – 2011-06-07 (×6): 10 meq via INTRAVENOUS
  Filled 2011-06-06: qty 100

## 2011-06-06 NOTE — ED Notes (Signed)
Pt resting quietly. Admitting physician at the bedside finishing assessment. Vital signs stable. Ready for transport.

## 2011-06-06 NOTE — Progress Notes (Signed)
Subjective: No events overnight. Pt reports persistent abdominal pain that is now slightly improved but no changes in diarrhea.  Objective:  Vital signs in last 24 hours:  Filed Vitals:   06/06/11 0600 06/06/11 0950 06/06/11 1357 06/06/11 1700  BP: 118/63 106/63 108/57 115/72  Pulse: 76 75 73 83  Temp: 98 F (36.7 C) 98.3 F (36.8 C) 98 F (36.7 C) 98.3 F (36.8 C)  TempSrc:  Oral Oral Oral  Resp: 18 20 20 18   Height:      Weight:      SpO2: 97% 98% 98% 99%    Intake/Output from previous day:   Intake/Output Summary (Last 24 hours) at 06/06/11 1857 Last data filed at 06/06/11 0700  Gross per 24 hour  Intake 1093.75 ml  Output      4 ml  Net 1089.75 ml    Physical Exam: General: Alert, awake, oriented x3, in no acute distress. HEENT: No bruits, no goiter. Heart: Regular rate and rhythm, without murmurs, rubs, gallops. Lungs: Clear to auscultation bilaterally. No wheezing, no rhonchi, no crackles Abdomen: Soft, epigastric tenderness, nondistended, positive bowel sounds. Extremities: No clubbing cyanosis or edema with positive pedal pulses. Neuro: Grossly intact, nonfocal.  Lab Results:  Basic Metabolic Panel:    Component Value Date/Time   NA 134* 06/06/2011 0702   K 3.0* 06/06/2011 0702   CL 104 06/06/2011 0702   CO2 19 06/06/2011 0702   BUN 16 06/06/2011 0702   CREATININE 0.89 06/06/2011 0702   GLUCOSE 69* 06/06/2011 0702   CALCIUM 8.6 06/06/2011 0702   CBC:    Component Value Date/Time   WBC 28.9* 06/06/2011 0702   HGB 13.5 06/06/2011 0702   HCT 40.1 06/06/2011 0702   PLT 481* 06/06/2011 0702   MCV 93.0 06/06/2011 0702   NEUTROABS 14.5* 06/01/2011 1955   LYMPHSABS 0.6* 06/01/2011 1955   MONOABS 1.0 06/01/2011 1955   EOSABS 0.0 06/01/2011 1955   BASOSABS 0.0 06/01/2011 1955    No results found for this or any previous visit (from the past 240 hour(s)).  Studies/Results: No results found.  Medications: Scheduled Meds:   . sodium chloride    Intravenous Once  . antiseptic oral rinse  15 mL Mouth Rinse q12n4p  . chlorhexidine  15 mL Mouth Rinse BID  . cholestyramine  4 g Oral BID  . ciprofloxacin  500 mg Oral BID  . heparin  5,000 Units Subcutaneous Q8H  .  HYDROmorphone (DILAUDID) injection  1 mg Intravenous Once  . mesalamine  1,200 mg Oral Q breakfast  . metroNIDAZOLE  500 mg Oral TID  . ondansetron (ZOFRAN) IV  4 mg Intravenous Once  . pantoprazole  40 mg Oral Daily  . potassium chloride  40 mEq Oral Daily  . predniSONE  10 mg Oral Daily   Continuous Infusions:   . sodium chloride 100 mL/hr at 06/06/11 0907   PRN Meds:.acetaminophen, acetaminophen, morphine, ondansetron (ZOFRAN) IV, ondansetron, oxyCODONE  Assessment/Plan:  Principal Problem:  *Diarrhea - unclear etiology, questionable Crohn's flare vs infectious colitis (C. Diff). We will obtain Ct abdomen and pelvis for further evaluation, stool for O&P, call GI consult for further recommendations. For now continue IVF (NS), replete electrolytes as indicated, continue Cipro and Flagyl but change to IV. Will continue mesalamine and questran per home regimen and start IV solumedrol. Keep NPO for now.  Active Problems:  Crohn's - this could be related to flare up, will get GI consult for further recommendations.   Hypertension - currently  well controlled   Hypokalemia - check magnesium level and replete potassium with IV KCl   ARF - likely prerenal, now resolved, continue IVF   UTI - unremarkable, only trace leukocytes present, pt is on cipro    LOS: 1 day   MAGICK-Taralyn Ferraiolo 06/06/2011, 6:57 PM

## 2011-06-06 NOTE — ED Provider Notes (Signed)
Medical screening examination/treatment/procedure(s) were performed by non-physician practitioner and as supervising physician I was immediately available for consultation/collaboration.   Alfonzo Feller, DO 06/06/11 [phone]

## 2011-06-06 NOTE — Progress Notes (Signed)
Dr. Quay Burow notified for clarification of contact orders. Dr. Quay Burow stated to d/c contact orders that it was not necessary because the patient had already been work up for her diarrhea and everything was negative.

## 2011-06-06 NOTE — H&P (Signed)
PCP:  Heide Scales NP  GI: Dr. Melina Copa at [facility]   Chief Complaint:  Abdominal pain  HPI:  28yoF with h/o Crohn's disease, HTN, known pancolitis on CT scan 05/25/2011 with  worsening abdominal pain, diarrhea, and hematochezia for several weeks.   Was previously admitted to [facility] 11/2-11/4. She was given IV prednisone  that admission and discharged on Prednisone 20 mg TID and Flagyl 500 mg TID x2 wks.  Was also given Cipro for ? UTI. CDiff was negative. CT abdomen with contrast showed  evidence of colitis, but atypical for Crohn's and more related to UC or  pseudomembranous colitis.   She was seen in f/u at [facility] on 11/6 for f/u and the  notes indicates she had negative Cdiff, giardia, Ecoli, campylobacter, salmonella.  She had been given empiric Tx for toxin negative Cdiff with Flagyl. Notes indicate  ? initation of Imuran while tapering Prednisone.   She was then seen in the ED here on 11/9 for abd pain and had lipase 102 so given  Dx of pancreatitis and discharged. Then, went to outside urgent care for dysuria  last night and was given Cipro for Dx of UTI.   Saw Dr. Melina Copa today for f/u called the ED and spoke with ED staff about admission.  Apparently, someone had also spoken to Elicia Lamp in [place] who stated the  patient should be admitted to hospitalist service with GI consultation.   In the ED: WBC 18.6, Hct 40.5, Plts 513. Chem with Na 134, and Cr up to 1.2 from  previous 0.7 on 11/9. Vitals stable in the ED.   Review of Systems:  Pt currently resting comfortably, pain is better controlled. ROS as above --  diffuse abd pain in all quadrants, diarrhea, hematochezia, and some persistent  dysuria. Denies fevers, chills, sweats, cardiopulmonary symptoms, cough, chest  pain, SOB. ROS o/w unremarkable   Past Medical History  Diagnosis Date  . Crohn's   . Hypertension   . Arthritis     Past  Surgical History  Procedure Date  . Cholecystectomy   . Abdominal hysterectomy   . Tonsillectomy   . Joint replacement   . Foot ganglion excision     Medications:  HOME MEDS: Prior to Admission medications   Medication Sig Start Date End Date Taking? Authorizing Provider  b complex vitamins capsule Take 1 capsule by mouth daily.     Yes Historical Provider, MD  cetirizine (ZYRTEC) 10 MG tablet Take 10 mg by mouth daily.     Yes Historical Provider, MD  cholestyramine Lucrezia Starch) 4 G packet Take 2 packets by mouth 2 (two) times daily as needed. 2 hours before or after any other medication as needed for diarrhea   Yes Historical Provider, MD  dicyclomine (BENTYL) 20 MG tablet Take 20 mg by mouth 3 (three) times daily as needed. For stomach pain   Yes Historical Provider, MD  losartan-hydrochlorothiazide (HYZAAR) 100-12.5 MG per tablet Take 1 tablet by mouth daily.     Yes Historical Provider, MD  mesalamine (LIALDA) 1.2 G EC tablet Take 1,200 mg by mouth daily with breakfast.    Yes Historical Provider, MD  metroNIDAZOLE (FLAGYL) 500 MG tablet Take 500 mg by mouth 3 (three) times daily.     Yes Historical Provider, MD  Multiple Vitamins-Minerals (MULTIVITAMINS THER. W/MINERALS) TABS Take 1 tablet by mouth daily.     Yes Historical Provider, MD  ondansetron (ZOFRAN) 4 MG tablet Take 4 mg  by mouth every 6 (six) hours as needed. For nausea.  06/01/11 06/08/11 Yes Molli Posey, MD  pantoprazole (PROTONIX) 40 MG tablet Take 40 mg by mouth daily.     Yes Historical Provider, MD  predniSONE (DELTASONE) 10 MG tablet Take 10 mg by mouth daily.     Yes Historical Provider, MD  traMADol (ULTRAM) 50 MG tablet Take 50 mg by mouth 2 (two) times daily as needed. For pain. Maximum dose= 8 tablets per day   Yes Historical Provider, MD    Allergies:  Allergies  Allergen Reactions  . Adhesive (Tape) Rash  . Sulfa Antibiotics Itching and Rash    Social History:   reports that she has never smoked.  She does not have any smokeless tobacco history on file. She reports that she does not drink alcohol or use illicit drugs.  Family History: No family history on file.  Physical Exam: Filed Vitals:   06/05/11 1821 06/05/11 2156 06/06/11 0026  BP: 102/64 125/72   Pulse: 88 70   Temp: 97.8 F (36.6 C) 98.3 F (36.8 C) 98.4 F (36.9 C)  TempSrc: Oral Oral Oral  Resp: 16 18   Height: 5' 6"  (1.676 m)    Weight: 74.844 kg (165 lb)    SpO2: 97% 98%    Blood pressure 125/72, pulse 70, temperature 98.4 F (36.9 C), temperature source Oral, resp. rate 18, height 5' 6"  (1.676 m), weight 74.844 kg (165 lb), SpO2 98.00%.  Gen: Pleasant, non-toxic appearing F in no distress, able to relate her history  well. Family at bedside.  HEENT: PERRL, about 4-52m bilaterally, EOMI, no icterus. Mouth is moist appearing,  not overly dry.  Neck: supple, no thyromegaly, normal Lungs: CTAB no w/c/r Heart: RRR, no m/g appreciated. Bilateral radials palpalbe Abd: A bit overweight but not obese, soft, non-peritoneal, non-distended, not  particularly tender throughout, but with minimal facial grimacing. BS+ Extrem: Warm, well perfused, no BLE edema, no cyanosis Neuro: grossly non-focal, CN2-12 intact, moves extremities, sits up in strether  without assistance    Labs & Imaging Results for orders placed during the hospital encounter of 06/05/11 (from the past 48 hour(s))  CBC     Status: Abnormal   Collection Time   06/05/11  9:23 PM      Component Value Range Comment   WBC 18.6 (*) 4.0 - 10.5 (K/uL)    RBC 4.40  3.87 - 5.11 (MIL/uL)    Hemoglobin 14.1  12.0 - 15.0 (g/dL)    HCT 40.5  36.0 - 46.0 (%)    MCV 92.0  78.0 - 100.0 (fL)    MCH 32.0  26.0 - 34.0 (pg)    MCHC 34.8  30.0 - 36.0 (g/dL)    RDW 14.6  11.5 - 15.5 (%)    Platelets 513 (*) 150 - 400 (K/uL)   POCT I-STAT, CHEM 8     Status: Abnormal   Collection Time   06/05/11  9:39 PM      Component Value Range Comment   Sodium 134 (*) 135 -  145 (mEq/L)    Potassium 4.8  3.5 - 5.1 (mEq/L)    Chloride 106  96 - 112 (mEq/L)    BUN 17  6 - 23 (mg/dL)    Creatinine, Ser 1.20 (*) 0.50 - 1.10 (mg/dL)    Glucose, Bld 116 (*) 70 - 99 (mg/dL)    Calcium, Ion 1.13  1.12 - 1.32 (mmol/L)    TCO2 21  0 - 100 (  mmol/L)    Hemoglobin 15.0  12.0 - 15.0 (g/dL)    HCT 44.0  36.0 - 46.0 (%)    No results found.  Impression Present on Admission:  .Crohn's .Hypertension  56yoF with h/o Crohn's disease, HTN, with worsening abdominal pain, diarrhea, and hematochezia for several weeks. Has known pancolitis on CT scan 05/25/2011 from [place], with negative infectious w/u. Now admitted with persistent abdominal pain, leukocytosis likely from steroids, and acute kidney injury.   1. Colitis: Will need GI consultation, ? colonoscopy. DDx includes progressive Crohn's colitis vs pseudomembranous colitis vs infectious colitis (although the latter seems less likely given negative prior w/u). Overnight, will control pain, continue Prednisone and Flagyl at present doses. Doubt utility of repeat imaging at this point, so will defer to GI. Also doubt utility of repeat infectious w/u given recent negative w/u so will also defer this. Of note, pt given Dx of pancreatitis ED visit 11/9 with lipase 109, suspect this minimally elevated level is due to generalized abdominal inflammation and not necessarily pancreas itself.   - Need to touch base with [facility] to see patient.  - IVF's continuous - Can try clear liquids as pt requesting but then NPO after 4a in case GI wants to scope - Repeat lipase  - Holding Bentyl - Continue Questran, Mesalamine, Flagyl, Prednisone at previous dosage   2. HTN: Holding Losartan/HCTZ given AKI. Well controlled at present.   3. Leukocytosis: Most likely due to steroids. Was also elevated 11/9 ED visit. Denies fevers or other infectious ROS.   4. Acute kidney injury: Cr up to 1.2 from 0.7 prior. Will give IVF's and trend. Likely  due to poor PO intake from GI issue.   5. UTI: Pt with dysuria, and outside Dx of UTI, started Cipro. Will get a UA/UCx, and continue Cipro started yesterday   6. Med rec: holding non essential meds   Full code, discussed with patient.  Regular bed, team 7  Other plans as per orders.  Jimya Ciani 06/06/2011, 12:28 AM

## 2011-06-06 NOTE — ED Provider Notes (Signed)
The note was mine, shared with [place], [place] Alvino Chapel, MD 06/06/11 1538

## 2011-06-07 ENCOUNTER — Inpatient Hospital Stay (HOSPITAL_COMMUNITY): Payer: Medicare Other

## 2011-06-07 ENCOUNTER — Encounter (HOSPITAL_COMMUNITY): Payer: Self-pay | Admitting: Radiology

## 2011-06-07 DIAGNOSIS — R197 Diarrhea, unspecified: Secondary | ICD-10-CM

## 2011-06-07 DIAGNOSIS — K5010 Crohn's disease of large intestine without complications: Secondary | ICD-10-CM

## 2011-06-07 LAB — CBC
MCHC: 33.7 g/dL (ref 30.0–36.0)
MCV: 93.1 fL (ref 78.0–100.0)
Platelets: 368 10*3/uL (ref 150–400)
RDW: 15 % (ref 11.5–15.5)
WBC: 19.5 10*3/uL — ABNORMAL HIGH (ref 4.0–10.5)

## 2011-06-07 LAB — BASIC METABOLIC PANEL
BUN: 12 mg/dL (ref 6–23)
Chloride: 106 mEq/L (ref 96–112)
Creatinine, Ser: 0.86 mg/dL (ref 0.50–1.10)
GFR calc Af Amer: 78 mL/min — ABNORMAL LOW (ref >90)
GFR calc non Af Amer: 67 mL/min — ABNORMAL LOW (ref >90)

## 2011-06-07 LAB — URINE MICROSCOPIC-ADD ON

## 2011-06-07 LAB — URINALYSIS, ROUTINE W REFLEX MICROSCOPIC
Bilirubin Urine: NEGATIVE
Nitrite: NEGATIVE
Specific Gravity, Urine: 1.027 (ref 1.005–1.030)
Urobilinogen, UA: 0.2 mg/dL (ref 0.0–1.0)
pH: 6 (ref 5.0–8.0)

## 2011-06-07 MED ORDER — POTASSIUM CHLORIDE 10 MEQ/100ML IV SOLN
10.0000 meq | INTRAVENOUS | Status: AC
  Administered 2011-06-07 – 2011-06-08 (×3): 10 meq via INTRAVENOUS
  Filled 2011-06-07 (×2): qty 100

## 2011-06-07 MED ORDER — IOHEXOL 300 MG/ML  SOLN
100.0000 mL | Freq: Once | INTRAMUSCULAR | Status: AC | PRN
  Administered 2011-06-07: 100 mL via INTRAVENOUS

## 2011-06-07 MED ORDER — VANCOMYCIN 50 MG/ML ORAL SOLUTION
500.0000 mg | Freq: Four times a day (QID) | ORAL | Status: DC
  Administered 2011-06-07 – 2011-06-08 (×3): 500 mg via ORAL
  Filled 2011-06-07 (×8): qty 10

## 2011-06-07 MED ORDER — METHYLPREDNISOLONE SODIUM SUCC 40 MG IJ SOLR
40.0000 mg | Freq: Three times a day (TID) | INTRAMUSCULAR | Status: DC
  Administered 2011-06-07 – 2011-06-13 (×19): 40 mg via INTRAVENOUS
  Filled 2011-06-07 (×21): qty 1

## 2011-06-07 MED ORDER — LOPERAMIDE HCL 2 MG PO CAPS
2.0000 mg | ORAL_CAPSULE | Freq: Two times a day (BID) | ORAL | Status: DC
  Administered 2011-06-07 – 2011-06-10 (×6): 2 mg via ORAL
  Filled 2011-06-07 (×10): qty 1

## 2011-06-07 MED ORDER — WHITE PETROLATUM GEL
Status: AC
  Filled 2011-06-07: qty 5

## 2011-06-07 MED ORDER — TUBERCULIN PPD 5 UNIT/0.1ML ID SOLN
5.0000 [IU] | Freq: Once | INTRADERMAL | Status: AC
  Administered 2011-06-07: 5 [IU] via INTRADERMAL
  Filled 2011-06-07: qty 0.1

## 2011-06-07 NOTE — Consult Note (Signed)
I have seen the patient, examined the patient and reviewed [name]'s note and agree with the following additions. I have also spoken to Dr. Melina Copa, he is her primary gastroenterologist.  Assessment  This lady has progressive Crohn's disease that is refractory to steroids and mesalamine or she has some unusual infection. She has been on multiple rounds of antibiotics over the past few months and so Dr. Melina Copa has thought maybe she had an atypical antibiotic associated colitis. She has had some brief response to IV steroids with recent hospitalization. However at this point she is having profuse watery diarrhea with lower abdominal cramping. CT scanning demonstrated pan colitis with some skipped areas which sounds like Crohn's disease to me. I have reviewed the images as well as reports.  Recommendations and plan  She needs a sigmoidoscopy tomorrow, Dr. Ardis Hughs will perform this. This will help [place] determine whether this is an infection or Crohn's disease. I think it's Crohn's. If it is, then Remicade therapy seems to make the most sense. I had an introductory conversation with her about this. We'll get a PPD and hepatitis B surface antigen. In the meantime we'll continue steroids and Cipro and metronidazole. I wouldn't change anything otherwise. I will discontinue her by mouth prednisone. Her C. difficile PCR is negative here, since she has had at least 2 C. difficile test negative in the past few weeks as well as multiple other stool studies for bacteria and parasites.  The risks and benefits as well as alternatives of endoscopic procedure(s) have been discussed and reviewed. All questions answered. The patient agrees to proceed.

## 2011-06-07 NOTE — Progress Notes (Signed)
I HAVE SEEN THIS PATIENT, WILL CONTINUE FOLLOWING WITH  PROGRESSION OF CARE AND WILL ARRANGE ANY HOME HEALTH/ DISCHARGE NEEDS PRIOR TO DISCHARGE.   UTILIZATION REVIEW COMPLETE 06/07/2011 Chauncy Lean [phone] OR [phone]

## 2011-06-07 NOTE — Consult Note (Signed)
[facility] Consultation  Referring Provider: [facility] Primary Care Physician:  Heide Scales, MD Primary Gastroenterologist:   Nehemiah Settle, MD Tia Alert) Reason for Consultation:  IBD  HPI: [name] [name] is a 70 y.o. female followed by Dr. Melina Copa in [place] for history of left sided Crohn's colitis diagnosed a couple of years ago. I reviewed numerous office and recent hospital records brought in by patient. Her last colonoscopy June 2012 revealed segmental erythema, ulceration, friability and loss of vascular pattern with contact bleeding in sigmoid. Patient had been doing well on Mesalamine until a couple of months ago when she developed rectal bleeding. She responded to course of steroids but then represented to her gastroenterologist with diarrhea. There was concern for C-Diff as patient had been on antibiotics for bronchitis but C-Diff was negative. Patient was treated empirically with Flagyl but due to little improvement, steroids were initiated. Because of worsening symptoms she was admitted to [facility] 05/25/11. In the hospital stool culture,O&P, and C-diff toxin were negative. CTscan was consistent with pancolitis. Patient improved with steroids and Flagyl and was discharged home on 05/27/11 on Prednisone, Questran and Flagyl. She did okay for a few days but then relapsed and is now having multiple bowel loose movements with blood. She feels terrible.     Past Medical History  Diagnosis Date  . Crohn's   . Hypertension   . Arthritis   . S/P laparoscopic cholecystectomy    Past Surgical History  Procedure Date  . Cholecystectomy   . Abdominal hysterectomy   . Tonsillectomy   . Joint replacement   . Foot ganglion excision     Prior to Admission medications   Medication Sig Start Date End Date Taking? Authorizing Provider  b complex vitamins capsule Take 1 capsule by mouth daily.     Yes Historical Provider, MD  cetirizine (ZYRTEC) 10 MG tablet Take  10 mg by mouth daily.     Yes Historical Provider, MD  cholestyramine Lucrezia Starch) 4 G packet Take 2 packets by mouth 2 (two) times daily as needed. 2 hours before or after any other medication as needed for diarrhea   Yes Historical Provider, MD  dicyclomine (BENTYL) 20 MG tablet Take 20 mg by mouth 3 (three) times daily as needed. For stomach pain   Yes Historical Provider, MD  losartan-hydrochlorothiazide (HYZAAR) 100-12.5 MG per tablet Take 1 tablet by mouth daily.     Yes Historical Provider, MD  mesalamine (LIALDA) 1.2 G EC tablet Take 1,200 mg by mouth daily with breakfast.    Yes Historical Provider, MD  metroNIDAZOLE (FLAGYL) 500 MG tablet Take 500 mg by mouth 3 (three) times daily.     Yes Historical Provider, MD  Multiple Vitamins-Minerals (MULTIVITAMINS THER. W/MINERALS) TABS Take 1 tablet by mouth daily.     Yes Historical Provider, MD  ondansetron (ZOFRAN) 4 MG tablet Take 4 mg by mouth every 6 (six) hours as needed. For nausea.  06/01/11 06/08/11 Yes Molli Posey, MD  pantoprazole (PROTONIX) 40 MG tablet Take 40 mg by mouth daily.     Yes Historical Provider, MD  predniSONE (DELTASONE) 10 MG tablet Take 10 mg by mouth daily.     Yes Historical Provider, MD  traMADol (ULTRAM) 50 MG tablet Take 50 mg by mouth 2 (two) times daily as needed. For pain. Maximum dose= 8 tablets per day   Yes Historical Provider, MD    Current Facility-Administered Medications  Medication Dose Route Frequency Provider Last Rate Last Dose  .  0.9 %  sodium chloride infusion   Intravenous Continuous Ria Bush, MD 100 mL/hr at 06/07/11 0841    . acetaminophen (TYLENOL) tablet 650 mg  650 mg Oral Q6H PRN Ria Bush, MD   650 mg at 06/06/11 2124   Or  . acetaminophen (TYLENOL) suppository 650 mg  650 mg Rectal Q6H PRN Ria Bush, MD      . antiseptic oral rinse (BIOTENE) solution 15 mL  15 mL Mouth Rinse q12n4p Ria Bush, MD   15 mL at 06/06/11 1600  . chlorhexidine (PERIDEX) 0.12 % solution 15 mL  15 mL  Mouth Rinse BID Ria Bush, MD   15 mL at 06/07/11 0841  . cholestyramine (QUESTRAN) packet 4 g  4 g Oral BID Ria Bush, MD   4 g at 06/06/11 2215  . ciprofloxacin (CIPRO) IVPB 400 mg  400 mg Intravenous Q12H Iskra Magick-Myers   400 mg at 06/07/11 0858  . heparin injection 5,000 Units  5,000 Units Subcutaneous Q8H Ria Bush, MD   5,000 Units at 06/07/11 0559  . iohexol (OMNIPAQUE) 300 MG/ML injection 100 mL  100 mL Intravenous Once PRN Medication Radiologist   100 mL at 06/07/11 0057  . mesalamine (LIALDA) EC tablet 1.2 g  1,200 mg Oral Q breakfast Ria Bush, MD   1.2 g at 06/07/11 0859  . methylPREDNISolone sodium succinate (SOLU-MEDROL) 40 MG injection 40 mg  40 mg Intravenous Q12H Iskra Magick-Myers   40 mg at 06/07/11 0800  . metroNIDAZOLE (FLAGYL) IVPB 500 mg  500 mg Intravenous Q8H Iskra Magick-Myers   500 mg at 06/07/11 0424  . morphine 2 MG/ML injection 2-4 mg  2-4 mg Intravenous Q4H PRN Ria Bush, MD   2 mg at 06/07/11 0858  . ondansetron (ZOFRAN) injection 4 mg  4 mg Intravenous Q4H PRN Iskra Magick-Myers      . oxyCODONE (Oxy IR/ROXICODONE) immediate release tablet 5 mg  5 mg Oral Q4H PRN Ria Bush, MD      . pantoprazole (PROTONIX) injection 40 mg  40 mg Intravenous Q24H Iskra Magick-Myers   40 mg at 06/06/11 2021  . potassium chloride 10 mEq in 100 mL IVPB  10 mEq Intravenous Q1 Hr x 6 Iskra Magick-Myers   10 mEq at 06/07/11 0345  . DISCONTD: ciprofloxacin (CIPRO) tablet 500 mg  500 mg Oral BID Ria Bush, MD   500 mg at 06/06/11 1819  . DISCONTD: metroNIDAZOLE (FLAGYL) tablet 500 mg  500 mg Oral TID Ria Bush, MD   500 mg at 06/06/11 1843  . DISCONTD: ondansetron (ZOFRAN) injection 4 mg  4 mg Intravenous Q6H PRN Ria Bush, MD   4 mg at 06/06/11 1238  . DISCONTD: ondansetron (ZOFRAN) tablet 4 mg  4 mg Oral Q6H PRN Ria Bush, MD   4 mg at 06/06/11 0808  . DISCONTD: pantoprazole (PROTONIX) EC tablet 40 mg  40 mg Oral Daily Ria Bush, MD      . DISCONTD: potassium chloride SA  (K-DUR,KLOR-CON) CR tablet 40 mEq  40 mEq Oral Daily Iskra Magick-Myers      . DISCONTD: predniSONE (DELTASONE) tablet 10 mg  10 mg Oral Daily Ria Bush, MD        Allergies as of 06/05/2011 - Review Complete 06/05/2011  Allergen Reaction Noted  . Adhesive (tape) Rash 06/05/2011  . 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 No [place] of colon cancer.  History   Social History  . Marital Status: Married  Spouse Name: N/A    Number of Children: N/A  . Years of Education: N/A   Occupational History  . Not on file.   Social History Main Topics  . Smoking status: Never Smoker   . Smokeless tobacco: Not on file  . Alcohol Use: No  . Drug Use: No  . Sexually Active: No   Other Topics Concern  . Not on file   Social History Narrative  . No narrative on file    Review of Systems: Positive for dysuria. All other systems reviewed and negative except where noted in HPI. Physical Exam: Vital signs in last 24 hours: Temp:  [98 F (36.7 C)-98.3 F (36.8 C)] 98 F (36.7 C) (11/15 0600) Pulse Rate:  [63-83] 63  (11/15 0600) Resp:  [18-20] 18  (11/15 0600) BP: (106-115)/(57-72) 114/67 mmHg (11/15 0600) SpO2:  [98 %-100 %] 100 % (11/15 0600) Last BM Date: 06/06/11 General:   Alert,  well-developed, white female in NAD Head:  Normocephalic and atraumatic. Eyes:   no icterus.   Conjunctiva pink. Ears:  Normal auditory acuity. Neck:  Supple; no masses felt Lungs:  Respirations even and unlabored. Lungs clear to auscultation bilaterlly.   No wheezes, crackles, or rhonchi.  Heart:  Regular rate and rhythm; no murmurs heard. Abdomen:  Soft, nondistended,mild diffuse lower abdominal tenderness. No masses, hepatomegaly or obvious hernias noted. Normal bowel sounds.No abdominal guarding. Rectal:  Not performed.  Msk:  Symmetrical without gross deformities.  Extremities:  Without edema. Neurologic:  Alert and  oriented x4;  grossly normal  neurologically. Skin:  Intact without significant lesions or rashes. Cervical Nodes:  No significant cervical adenopathy. Psych:  Alert. Slightly agitated but cooperative.   Intake/Output from previous day: 11/14 0701 - 11/15 0700 In: 2262 [I.V.:2262] Out: 7 [Emesis/NG output:3; Stool:4] Intake/Output this shift:    Lab Results:  Basename 06/07/11 0650 06/06/11 0702 06/06/11 0208  WBC 19.5* 28.9* 23.3*  HGB 12.3 13.5 13.8  HCT 36.5 40.1 41.1  PLT 368 481* 505*   BMET  Basename 06/07/11 0650 06/06/11 0702 06/06/11 0208 06/05/11 2139  NA 134* 134* -- 134*  K 3.6 3.0* -- 4.8  CL 106 104 -- 106  CO2 18* 19 -- --  GLUCOSE 96 69* -- 116*  BUN 12 16 -- 17  CREATININE 0.86 0.89 0.97 --  CALCIUM 7.9* 8.6 -- --   LFT  Basename 06/06/11 0702  PROT 5.5*  ALBUMIN 2.5*  AST 10  ALT 16  ALKPHOS 45  BILITOT 0.4  BILIDIR --  IBILI --    Studies/Results: Ct Abdomen Pelvis W Contrast  06/07/2011  *RADIOLOGY REPORT*  Clinical Data: Left lower quadrant abdominal pain and nausea. History of Crohn's disease.  CT ABDOMEN AND PELVIS WITH CONTRAST  Technique:  Multidetector CT imaging of the abdomen and pelvis was performed following the standard protocol during bolus administration of intravenous contrast.  Contrast: 174m OMNIPAQUE IOHEXOL 300 MG/ML IV SOLN  Comparison: MRI of the lumbar spine performed 06/26/2008  Findings: Minimal bibasilar atelectasis is noted.  The liver is unremarkable in appearance.  The patient is status post cholecystectomy, with clips noted at the gallbladder fossa. The spleen is normal in appearance.  The pancreas and adrenal glands are unremarkable.  The kidneys are unremarkable in appearance.  Mild nonspecific perinephric stranding is noted bilaterally.  No renal or ureteral stones are seen.  There is no evidence of hydronephrosis. Bilateral extrarenal pelves are seen.  No free fluid is identified.  The small  bowel is unremarkable in appearance.  The stomach is  within normal limits.  No acute vascular abnormalities are seen.  Mild scattered calcification is noted along the abdominal aorta and its branches.  The appendix is not seen; there is no evidence for appendicitis. There is minimal relatively diffuse colonic wall thickening, with segmental areas of sparing, likely reflecting chronic changes from the patient's Crohn's disease.  No definite Crohn's exacerbation is characterized.  The colon is otherwise grossly unremarkable in appearance.  The bladder is moderately distended and unremarkable in appearance. The patient is status post hysterectomy; no suspicious adnexal masses are seen.  No inguinal lymphadenopathy is seen.  No acute osseous abnormalities are identified.  There is stable mild grade 1 anterolisthesis of L4 on L5, reflecting facet disease. There is narrowing of the intervertebral disc space at L2-L3, with associated vacuum phenomenon.  IMPRESSION:  1.  Minimal relatively diffuse colonic wall thickening noted, with segmental areas of sparing, likely reflecting chronic changes from the patient's Crohn's disease.  No definite Crohn's exacerbation seen.  No free fluid appreciated. 2.  Mild degenerative change noted along the lumbar spine.  Original Report Authenticated By: Santa Lighter, M.D.    Previous Endoscopies: June 2012 Dr. Melina Copa, refer to HPI.   Impression / Plan: 1.  Crohn's colitis, left sided now with flare symptoms and CTscan c/w with diffuse colitis with some segmental areas of sparing. She could have superimposed infectious colitis. C-Diff toxin negative recently, didn't find a PCR but that is now pending. This could be extention of her Crohns colitis as well. Agree with Cipro, Flagyl, Solumedrol and Mesalamine. If C-Diff PCR positive then stop Cipro and Flagyl and begin Vancomycin. Her abdominal exam is not overly concerning, suspect leukocytosis due in part to steroids. Twice daily Questran is okay to try and control some of the  diarrhea. Further recommendations pending clinical course.  2. History of adenomatous colon polyps June 2012, up to date on surveillance exams.     LOS: 2 days   [name]  06/07/2011, 9:34 AM

## 2011-06-07 NOTE — Progress Notes (Signed)
ANTIBIOTIC CONSULT NOTE - INITIAL  Pharmacy Consult for Vancomycin Indication: C.Diff  Allergies  Allergen Reactions  . Adhesive (Tape) Rash  . Sulfa Antibiotics Itching and Rash    Patient Measurements: Height: 5' 6"  (167.6 cm) Weight: 166 lb 9.6 oz (75.569 kg) IBW/kg (Calculated) : 59.3   Vital Signs: Temp: 98 F (36.7 C) (11/15 1400) Temp src: Oral (11/15 1400) BP: 119/58 mmHg (11/15 1400) Pulse Rate: 64  (11/15 1400) Intake/Output from previous day: 11/14 0701 - 11/15 0700 In: 2262 [I.V.:2262] Out: 7 [Emesis/NG output:3; Stool:4] Intake/Output from this shift:    Labs:  [facility] 06/07/11 0650 06/06/11 0702 06/06/11 0208  WBC 19.5* 28.9* 23.3*  HGB 12.3 13.5 13.8  PLT 368 481* 505*  LABCREA -- -- --  CREATININE 0.86 0.89 0.97   Estimated Creatinine Clearance: 64.1 ml/min (by C-G formula based on Cr of 0.86).  Microbiology: Recent Results (from the past 720 hour(s))  CLOSTRIDIUM DIFFICILE BY PCR     Status: Normal   Collection Time   06/06/11  5:39 PM      Component Value Range Status Comment   C difficile by pcr NEGATIVE  NEGATIVE  Final     Medical History: Past Medical History  Diagnosis Date  . Crohn's   . Hypertension   . Arthritis   . Crohn's   . S/P appy   . S/P laparoscopic cholecystectomy   . Colon polyps     adenomatous polyp June 2012    Medications:  Prescriptions prior to admission  Medication Sig Dispense Refill  . b complex vitamins capsule Take 1 capsule by mouth daily.        . cetirizine (ZYRTEC) 10 MG tablet Take 10 mg by mouth daily.        . cholestyramine (QUESTRAN) 4 G packet Take 2 packets by mouth 2 (two) times daily as needed. 2 hours before or after any other medication as needed for diarrhea      . dicyclomine (BENTYL) 20 MG tablet Take 20 mg by mouth 3 (three) times daily as needed. For stomach pain      . losartan-hydrochlorothiazide (HYZAAR) 100-12.5 MG per tablet Take 1 tablet by mouth daily.        . mesalamine  (LIALDA) 1.2 G EC tablet Take 1,200 mg by mouth daily with breakfast.       . metroNIDAZOLE (FLAGYL) 500 MG tablet Take 500 mg by mouth 3 (three) times daily.        . Multiple Vitamins-Minerals (MULTIVITAMINS THER. W/MINERALS) TABS Take 1 tablet by mouth daily.        . ondansetron (ZOFRAN) 4 MG tablet Take 4 mg by mouth every 6 (six) hours as needed. For nausea.       . pantoprazole (PROTONIX) 40 MG tablet Take 40 mg by mouth daily.        . predniSONE (DELTASONE) 10 MG tablet Take 10 mg by mouth daily.        . traMADol (ULTRAM) 50 MG tablet Take 50 mg by mouth 2 (two) times daily as needed. For pain. Maximum dose= 8 tablets per day       Scheduled:    . antiseptic oral rinse  15 mL Mouth Rinse q12n4p  . chlorhexidine  15 mL Mouth Rinse BID  . cholestyramine  4 g Oral BID  . ciprofloxacin  400 mg Intravenous Q12H  . heparin  5,000 Units Subcutaneous Q8H  . loperamide  2 mg Oral q12n4p  . mesalamine  1,200 mg Oral Q breakfast  . methylPREDNISolone (SOLU-MEDROL) injection  40 mg Intravenous Q8H  . pantoprazole (PROTONIX) IV  40 mg Intravenous Q24H  . potassium chloride  10 mEq Intravenous Q1 Hr x 6  . potassium chloride  10 mEq Intravenous Q1 Hr x 4  . tuberculin  5 Units Intradermal Once  . vancomycin  500 mg Oral Q6H  . DISCONTD: ciprofloxacin  500 mg Oral BID  . DISCONTD: methylPREDNISolone (SOLU-MEDROL) injection  40 mg Intravenous Q12H  . DISCONTD: metronidazole  500 mg Intravenous Q8H  . DISCONTD: metroNIDAZOLE  500 mg Oral TID  . DISCONTD: pantoprazole  40 mg Oral Daily  . DISCONTD: potassium chloride  40 mEq Oral Daily  . DISCONTD: predniSONE  10 mg Oral Daily   Assessment: 70 yo F admitted 11/14 with worsening abdominal pain, diarrhea and hematochezia. History of Crohn's, HTN, pancolitis.  CDiff negative yet persistent diarrhea to treat like diarrhea.  Patient Active Problem List  Diagnoses  . Crohn's  . Hypertension  . Arthritis  . Diarrhea  . Hypokalemia  . ARF  (acute renal failure)   Pharmacy Problems: Persistent C.Diff like diarrhea: Changing from IV flagyl to PO vancomycin, D#2/? IV ciprofloxacin Crohn's: cholestyramine, immodium, methylprednisolone HTN: BP stable no treatment Hypokalemia: K 3.6  DVT Prophylaxsis: SQ Heparin  Goal of Therapy:  Vancomycin dosing for C.Diff  Plan:  1. Vancomycin 500 mg PO four times daily, dose will remain appropriate regardless of renal function.  Plan length of therapy based on clinical course. Pharmacy will sign off at this time, please call for questions.  Rowe Robert Pharm.D., BCPS 06/07/2011 4:12 PM [phone]

## 2011-06-07 NOTE — Progress Notes (Signed)
Subjective: No events overnight. Pt reports persistent diarrhea.  Objective:  Vital signs in last 24 hours:  Filed Vitals:   06/06/11 0950 06/06/11 1357 06/06/11 1700 06/07/11 0600  BP: 106/63 108/57 115/72 114/67  Pulse: 75 73 83 63  Temp: 98.3 F (36.8 C) 98 F (36.7 C) 98.3 F (36.8 C) 98 F (36.7 C)  TempSrc: Oral Oral Oral Oral  Resp: 20 20 18 18   Height:      Weight:      SpO2: 98% 98% 99% 100%    Intake/Output from previous day:   Intake/Output Summary (Last 24 hours) at 06/07/11 1341 Last data filed at 06/07/11 0700  Gross per 24 hour  Intake   2262 ml  Output      7 ml  Net   2255 ml    Physical Exam: General: Alert, awake, oriented x3, in no acute distress. HEENT: No bruits, no goiter. Heart: Regular rate and rhythm, without murmurs, rubs, gallops. Lungs: Clear to auscultation bilaterally. Abdomen: Soft, epigastric tenderness, nondistended, positive bowel sounds. Extremities: No clubbing cyanosis or edema with positive pedal pulses. Neuro: Grossly intact, nonfocal.   Lab Results:  Basic Metabolic Panel:    Component Value Date/Time   NA 134* 06/07/2011 0650   K 3.6 06/07/2011 0650   CL 106 06/07/2011 0650   CO2 18* 06/07/2011 0650   BUN 12 06/07/2011 0650   CREATININE 0.86 06/07/2011 0650   GLUCOSE 96 06/07/2011 0650   CALCIUM 7.9* 06/07/2011 0650   CBC:    Component Value Date/Time   WBC 19.5* 06/07/2011 0650   HGB 12.3 06/07/2011 0650   HCT 36.5 06/07/2011 0650   PLT 368 06/07/2011 0650   MCV 93.1 06/07/2011 0650   NEUTROABS 14.5* 06/01/2011 1955   LYMPHSABS 0.6* 06/01/2011 1955   MONOABS 1.0 06/01/2011 1955   EOSABS 0.0 06/01/2011 1955   BASOSABS 0.0 06/01/2011 1955    Recent Results (from the past 240 hour(s))  CLOSTRIDIUM DIFFICILE BY PCR     Status: Normal   Collection Time   06/06/11  5:39 PM      Component Value Range Status Comment   C difficile by pcr NEGATIVE  NEGATIVE  Final     Studies/Results: Ct Abdomen Pelvis W  Contrast 06/07/2011    IMPRESSION:   1.  Minimal relatively diffuse colonic wall thickening noted, with segmental areas of sparing, likely reflecting chronic changes from the patient's Crohn's disease.  No definite Crohn's exacerbation seen.  No free fluid appreciated.  2.  Mild degenerative change noted along the lumbar spine.     Medications: Scheduled Meds:   . antiseptic oral rinse  15 mL Mouth Rinse q12n4p  . chlorhexidine  15 mL Mouth Rinse BID  . cholestyramine  4 g Oral BID  . ciprofloxacin  400 mg Intravenous Q12H  . heparin  5,000 Units Subcutaneous Q8H  . mesalamine  1,200 mg Oral Q breakfast  . methylPREDNISolone (SOLU-MEDROL) injection  40 mg Intravenous Q12H  . metronidazole  500 mg Intravenous Q8H  . pantoprazole (PROTONIX) IV  40 mg Intravenous Q24H  . potassium chloride  10 mEq Intravenous Q1 Hr x 6  . DISCONTD: ciprofloxacin  500 mg Oral BID  . DISCONTD: metroNIDAZOLE  500 mg Oral TID  . DISCONTD: pantoprazole  40 mg Oral Daily  . DISCONTD: potassium chloride  40 mEq Oral Daily  . DISCONTD: predniSONE  10 mg Oral Daily   Continuous Infusions:   . sodium chloride 100 mL/hr at 06/07/11  0841   PRN Meds:.acetaminophen, acetaminophen, iohexol, morphine, ondansetron (ZOFRAN) IV, oxyCODONE, DISCONTD: ondansetron (ZOFRAN) IV, DISCONTD: ondansetron  Assessment/Plan:  Principal Problem:  *Diarrhea - minimal improvement per patient, will continue solumedrol IV 40 mg Q 8 hours along with mesalamine. C. Diff by PCR is negative and I was told by primary GI Dr. Melina Copa pt has had numerous C. Diff studies negative but clinically appeared as having C. Diff colitis. Pt is currently on Cipro and Flagyl. Pt was switched to IV form since unable to tolerate PO. Dr. Melina Copa recommended switching to Vancomycin and we can certainly try that. Will follow up on GI recommendations.  Active Problems:  Crohn's - continue home medication regimen along with solumedrol IV   Hypertension -  slightly on the soft side, continue IVF for now and monitor vitals   Hypokalemia - supplemented but continue to monitor given persistent diarrhea. Pt wants IV form potassium so will check levels in the AM and give K if necessary.   ARF (acute renal failure) - resolved and now stable and within normal limits    LOS: 2 days   MAGICK-Saadia Dewitt 06/07/2011, 1:41 PM

## 2011-06-08 ENCOUNTER — Encounter (HOSPITAL_COMMUNITY): Payer: Self-pay | Admitting: *Deleted

## 2011-06-08 ENCOUNTER — Other Ambulatory Visit: Payer: Self-pay | Admitting: Gastroenterology

## 2011-06-08 ENCOUNTER — Encounter (HOSPITAL_COMMUNITY)
Admission: EM | Disposition: A | Discharge status: Home / Self Care | Payer: Self-pay | Source: Ambulatory Visit | Attending: Internal Medicine

## 2011-06-08 DIAGNOSIS — R197 Diarrhea, unspecified: Secondary | ICD-10-CM

## 2011-06-08 DIAGNOSIS — K5010 Crohn's disease of large intestine without complications: Secondary | ICD-10-CM

## 2011-06-08 HISTORY — PX: FLEXIBLE SIGMOIDOSCOPY: SHX5431

## 2011-06-08 LAB — BASIC METABOLIC PANEL
BUN: 12 mg/dL (ref 6–23)
Chloride: 107 mEq/L (ref 96–112)
Glucose, Bld: 98 mg/dL (ref 70–99)
Potassium: 3.4 mEq/L — ABNORMAL LOW (ref 3.5–5.1)
Sodium: 134 mEq/L — ABNORMAL LOW (ref 135–145)

## 2011-06-08 LAB — CBC
HCT: 34.4 % — ABNORMAL LOW (ref 36.0–46.0)
Hemoglobin: 11.5 g/dL — ABNORMAL LOW (ref 12.0–15.0)
RBC: 3.67 MIL/uL — ABNORMAL LOW (ref 3.87–5.11)
WBC: 11.5 10*3/uL — ABNORMAL HIGH (ref 4.0–10.5)

## 2011-06-08 LAB — GIARDIA/CRYPTOSPORIDIUM SCREEN(EIA): Giardia Screen - EIA: NEGATIVE

## 2011-06-08 SURGERY — SIGMOIDOSCOPY, FLEXIBLE
Anesthesia: Moderate Sedation

## 2011-06-08 MED ORDER — POTASSIUM CHLORIDE 10 MEQ/100ML IV SOLN
10.0000 meq | INTRAVENOUS | Status: AC
  Administered 2011-06-08 (×3): 10 meq via INTRAVENOUS
  Filled 2011-06-08: qty 100

## 2011-06-08 MED ORDER — POTASSIUM CHLORIDE 10 MEQ/100ML IV SOLN
INTRAVENOUS | Status: AC
  Filled 2011-06-08: qty 100

## 2011-06-08 MED ORDER — FENTANYL NICU IV SYRINGE 50 MCG/ML
INJECTION | INTRAMUSCULAR | Status: DC | PRN
  Administered 2011-06-08 (×2): 25 ug via INTRAVENOUS

## 2011-06-08 MED ORDER — MIDAZOLAM HCL 10 MG/2ML IJ SOLN
INTRAMUSCULAR | Status: DC | PRN
  Administered 2011-06-08 (×2): 2 mg via INTRAVENOUS

## 2011-06-08 MED ORDER — JUVEN PO PACK
1.0000 | PACK | Freq: Two times a day (BID) | ORAL | Status: DC
  Administered 2011-06-08 – 2011-06-12 (×8): 1 via ORAL
  Filled 2011-06-08 (×18): qty 1

## 2011-06-08 MED ORDER — MIDAZOLAM HCL 10 MG/2ML IJ SOLN
INTRAMUSCULAR | Status: AC
  Filled 2011-06-08: qty 2

## 2011-06-08 MED ORDER — BOOST / RESOURCE BREEZE PO LIQD
1.0000 | Freq: Three times a day (TID) | ORAL | Status: DC
  Administered 2011-06-08 – 2011-06-13 (×9): 1 via ORAL

## 2011-06-08 MED ORDER — DIPHENHYDRAMINE HCL 50 MG/ML IJ SOLN
INTRAMUSCULAR | Status: AC
  Filled 2011-06-08: qty 1

## 2011-06-08 MED ORDER — MORPHINE SULFATE 4 MG/ML IJ SOLN
INTRAMUSCULAR | Status: AC
  Administered 2011-06-08: 2 mg
  Filled 2011-06-08: qty 1

## 2011-06-08 MED ORDER — POTASSIUM CHLORIDE 10 MEQ/100ML IV SOLN
10.0000 meq | INTRAVENOUS | Status: DC
Start: 2011-06-08 — End: 2011-06-08

## 2011-06-08 MED ORDER — FENTANYL CITRATE 0.05 MG/ML IJ SOLN
INTRAMUSCULAR | Status: AC
  Filled 2011-06-08: qty 2

## 2011-06-08 MED ORDER — CLOTRIMAZOLE 1 % VA CREA
1.0000 | TOPICAL_CREAM | Freq: Every day | VAGINAL | Status: DC
  Filled 2011-06-08 (×2): qty 45

## 2011-06-08 MED ORDER — ESTROGENS, CONJUGATED 0.625 MG/GM VA CREA
1.0000 | TOPICAL_CREAM | Freq: Every day | VAGINAL | Status: DC
  Administered 2011-06-08 – 2011-06-13 (×6): 1 via VAGINAL
  Filled 2011-06-08: qty 42.5

## 2011-06-08 MED ORDER — POTASSIUM CHLORIDE 10 MEQ/100ML IV SOLN
INTRAVENOUS | Status: AC
  Administered 2011-06-08: 10 meq via INTRAVENOUS
  Filled 2011-06-08: qty 100

## 2011-06-08 NOTE — Progress Notes (Signed)
Subjective: No events overnight.   Objective:  Vital signs in last 24 hours:  Filed Vitals:   06/08/11 1105 06/08/11 1113 06/08/11 1123 06/08/11 1145  BP: 128/62 103/63 102/62 115/65  Pulse:      Temp:      TempSrc:      Resp: 17 19  18   Height:      Weight:      SpO2: 94% 97% 97% 99%    Intake/Output from previous day:   Intake/Output Summary (Last 24 hours) at 06/08/11 1430 Last data filed at 06/07/11 1700  Gross per 24 hour  Intake    800 ml  Output      1 ml  Net    799 ml    Physical Exam: General: Alert, awake, oriented x3, in no acute distress. HEENT: No bruits, no goiter. Heart: Regular rate and rhythm, without murmurs, rubs, gallops. Lungs: Clear to auscultation bilaterally. Abdomen: Soft, nontender, nondistended, positive bowel sounds. Extremities: No clubbing cyanosis or edema with positive pedal pulses. Neuro: Grossly intact, nonfocal.   Lab Results:  Basic Metabolic Panel:    Component Value Date/Time   NA 134* 06/08/2011 0500   K 3.4* 06/08/2011 0500   CL 107 06/08/2011 0500   CO2 19 06/08/2011 0500   BUN 12 06/08/2011 0500   CREATININE 0.75 06/08/2011 0500   GLUCOSE 98 06/08/2011 0500   CALCIUM 8.3* 06/08/2011 0500   CBC:    Component Value Date/Time   WBC 11.5* 06/08/2011 0500   HGB 11.5* 06/08/2011 0500   HCT 34.4* 06/08/2011 0500   PLT 350 06/08/2011 0500   MCV 93.7 06/08/2011 0500   NEUTROABS 14.5* 06/01/2011 1955   LYMPHSABS 0.6* 06/01/2011 1955   MONOABS 1.0 06/01/2011 1955   EOSABS 0.0 06/01/2011 1955   BASOSABS 0.0 06/01/2011 1955    Recent Results (from the past 240 hour(s))  CLOSTRIDIUM DIFFICILE BY PCR     Status: Normal   Collection Time   06/06/11  5:39 PM      Component Value Range Status Comment   C difficile by pcr NEGATIVE  NEGATIVE  Final     Studies/Results: Ct Abdomen Pelvis W Contrast  06/07/2011 IMPRESSION:  1.  Minimal relatively diffuse colonic wall thickening noted, with segmental areas of sparing,  likely reflecting chronic changes from the patient's Crohn's disease.  No definite Crohn's exacerbation seen.  No free fluid appreciated. 2.  Mild degenerative change noted along the lumbar spine.      Medications: Scheduled Meds:   . heparin  5,000 Units Subcutaneous Q8H  . loperamide  2 mg Oral q12n4p  . mesalamine  1,200 mg Oral Q breakfast  . methylPREDNISolone (SOLU-MEDROL) injection  40 mg Intravenous Q8H  . morphine      . pantoprazole (PROTONIX) IV  40 mg Intravenous Q24H  . potassium chloride  10 mEq Intravenous Q1 Hr x 4  . potassium chloride  10 mEq Intravenous Q1 Hr x 6  . potassium chloride      . tuberculin  5 Units Intradermal Once  . white petrolatum      . DISCONTD: antiseptic oral rinse  15 mL Mouth Rinse q12n4p  . DISCONTD: chlorhexidine  15 mL Mouth Rinse BID  . DISCONTD: cholestyramine  4 g Oral BID  . DISCONTD: ciprofloxacin  400 mg Intravenous Q12H  . DISCONTD: vancomycin  500 mg Oral Q6H   Continuous Infusions:   . sodium chloride 100 mL/hr at 06/08/11 0634   PRN Meds:.acetaminophen, acetaminophen, morphine,  ondansetron (ZOFRAN) IV, oxyCODONE, DISCONTD: fentaNYL, DISCONTD: midazolam  Assessment/Plan:  Principal Problem:  *Diarrhea - appears to be secondary to sever colitis as per flex sigmoidoscopy, continue per GI recommendations  Active Problems:  Hypertension - well controlled during the hospitalization   Hypokalemia - continue to supplement and obtain BMP in AM   ARF (acute renal failure) - resolved    LOS: 3 days   MAGICK-Avagrace Botelho 06/08/2011, 2:30 PM

## 2011-06-08 NOTE — Progress Notes (Signed)
INITIAL ADULT NUTRITION ASSESSMENT Date: 06/08/2011   Time: 3:23 PM Reason for Assessment: Nutrition risk assessment  ASSESSMENT: Female 70 y.o.  Dx: Diarrhea secondary to severe colitis per flex sigmoidoscopy  Hx:  Past Medical History  Diagnosis Date  . Crohn's   . Hypertension   . Crohn's   . S/P appy   . S/P laparoscopic cholecystectomy   . Colon polyps     adenomatous polyp June 2012  . Complication of anesthesia     slow to wake  . Shortness of breath   . Blood transfusion     1966  . Headache     hx migraines  . Arthritis     shoulder   Related Meds: KCL, imodium, lialda, Solumedrol, protonix   Ht: 5' 6"  (167.6 cm)  Wt: 166 lb 9.6 oz (75.569 kg)  Ideal Wt: 59.3 kg  % Ideal Wt: 127  Usual Wt: 178# % Usual Wt:  93  Body mass index is 26.89 kg/(m^2).  Food/Nutrition Related Hx: Intolerance to po for one week with diarrhea.  Usual diet is regular.  Uses lactaid milk.  No other restrictions prior to admit.  Labs:Results for SEDA, KRONBERG (MRN [id number]) as of 06/08/2011 15:27  Ref. Range 06/08/2011 05:00  Sodium Latest Range: 135-145 mEq/L 134 (L)  Potassium Latest Range: 3.5-5.1 mEq/L 3.4 (L)  Chloride Latest Range: 96-112 mEq/L 107  CO2 Latest Range: 19-32 mEq/L 19  BUN Latest Range: 6-23 mg/dL 12  Creat Latest Range: 0.50-1.10 mg/dL 0.75  Calcium Latest Range: 8.4-10.5 mg/dL 8.3 (L)  GFR calc non Af Amer Latest Range: >90 mL/min 84 (L)  GFR calc Af Amer Latest Range: >90 mL/min >90  Glucose Latest Range: 70-99 mg/dL 98  WBC Latest Range: 4.0-10.5 K/uL 11.5 (H)  RBC Latest Range: 3.87-5.11 MIL/uL 3.67 (L)  HGB Latest Range: 12.0-15.0 g/dL 11.5 (L)  HCT Latest Range: 36.0-46.0 % 34.4 (L)  MCV Latest Range: 78.0-100.0 fL 93.7  MCH Latest Range: 26.0-34.0 pg 31.3  MCHC Latest Range: 30.0-36.0 g/dL 33.4  RDW Latest Range: 11.5-15.5 % 15.0  Platelets Latest Range: 150-400 K/uL 350    Diet Order: Full Liquid   IVF:    sodium chloride Last  Rate: 100 mL/hr at 06/08/11 0634    Estimated Nutritional Needs:per day   Kcal: 1600-1700 Protein: 70-80 Fluid: >1.6L  Pt with poor intake for 1 week.  Eating full liquid diet well but with diarrhea.  Followed low lactose diet prior to admit.  Pt with Malnutrition due to acute illness related to altered GI function as evidenced by 7% weight loss in past week and reduced muscle mass per pt.    NUTRITION DIAGNOSIS: -Inadequate oral intake (NI-2.1).  Status: Ongoing  RELATED TO: Altered GI fx  AS EVIDENCE BY: diarrhea  MONITORING/EVALUATION(Goals):  Tolerance of regular diet.  EDUCATION NEEDS: -No education needs identified at this time  INTERVENTION: Add Armed forces logistics/support/administrative officer tid.   Add Juven bid Preferences taken Recommend regular low lactose diet when advanced  Dietitian [phone]  DOCUMENTATION CODES Per approved criteria  -Severe malnutrition in the context of acute illness or injury    Darrol Jump 06/08/2011, 3:23 PM

## 2011-06-09 LAB — BASIC METABOLIC PANEL
CO2: 23 mEq/L (ref 19–32)
Calcium: 8.4 mg/dL (ref 8.4–10.5)
Chloride: 110 mEq/L (ref 96–112)
Glucose, Bld: 132 mg/dL — ABNORMAL HIGH (ref 70–99)
Sodium: 138 mEq/L (ref 135–145)

## 2011-06-09 LAB — CBC
Hemoglobin: 11.2 g/dL — ABNORMAL LOW (ref 12.0–15.0)
MCH: 31 pg (ref 26.0–34.0)
MCV: 94.7 fL (ref 78.0–100.0)
RBC: 3.61 MIL/uL — ABNORMAL LOW (ref 3.87–5.11)
WBC: 9.6 10*3/uL (ref 4.0–10.5)

## 2011-06-09 LAB — URINE CULTURE

## 2011-06-09 MED ORDER — MICONAZOLE NITRATE 2 % VA CREA
1.0000 | TOPICAL_CREAM | Freq: Every day | VAGINAL | Status: DC
  Administered 2011-06-09 – 2011-06-12 (×5): 1 via VAGINAL
  Filled 2011-06-09 (×2): qty 45

## 2011-06-09 MED ORDER — POTASSIUM CHLORIDE 10 MEQ/100ML IV SOLN
10.0000 meq | INTRAVENOUS | Status: AC
  Administered 2011-06-09 (×3): 10 meq via INTRAVENOUS

## 2011-06-09 MED ORDER — PANTOPRAZOLE SODIUM 40 MG PO TBEC
40.0000 mg | DELAYED_RELEASE_TABLET | Freq: Every day | ORAL | Status: DC
  Administered 2011-06-09 – 2011-06-13 (×5): 40 mg via ORAL
  Filled 2011-06-09 (×5): qty 1

## 2011-06-09 NOTE — Progress Notes (Signed)
Subjective: No events overnight. Pt doing better, less diarrhea and frequency of stools.  Objective:  Vital signs in last 24 hours:  Filed Vitals:   06/08/11 1145 06/08/11 1512 06/08/11 2100 06/09/11 0528  BP: 115/65 123/63 114/59 137/80  Pulse:  62 61 59  Temp:  97.7 F (36.5 C) 97.6 F (36.4 C) 97.7 F (36.5 C)  TempSrc:  Oral Oral Oral  Resp: 18 18 20 20   Height:      Weight:      SpO2: 99% 92% 100% 98%    Intake/Output from previous day:   Intake/Output Summary (Last 24 hours) at 06/09/11 1333 Last data filed at 06/09/11 0514  Gross per 24 hour  Intake 1835.3 ml  Output      0 ml  Net 1835.3 ml    Physical Exam: General: Alert, awake, oriented x3, in no acute distress. HEENT: No bruits, no goiter. Heart: Regular rate and rhythm, without murmurs, rubs, gallops. Lungs: Clear to auscultation bilaterally. Abdomen: Soft, nontender, nondistended, positive bowel sounds. Extremities: No clubbing cyanosis or edema with positive pedal pulses. Neuro: Grossly intact, nonfocal.   Lab Results:  Basic Metabolic Panel:    Component Value Date/Time   NA 138 06/09/2011 0805   K 4.9 06/09/2011 0805   CL 110 06/09/2011 0805   CO2 23 06/09/2011 0805   BUN 16 06/09/2011 0805   CREATININE 0.68 06/09/2011 0805   GLUCOSE 132* 06/09/2011 0805   CALCIUM 8.4 06/09/2011 0805   CBC:    Component Value Date/Time   WBC 9.6 06/09/2011 0805   HGB 11.2* 06/09/2011 0805   HCT 34.2* 06/09/2011 0805   PLT 310 06/09/2011 0805   MCV 94.7 06/09/2011 0805   NEUTROABS 14.5* 06/01/2011 1955   LYMPHSABS 0.6* 06/01/2011 1955   MONOABS 1.0 06/01/2011 1955   EOSABS 0.0 06/01/2011 1955   BASOSABS 0.0 06/01/2011 1955    Recent Results (from the past 240 hour(s))  CLOSTRIDIUM DIFFICILE BY PCR     Status: Normal   Collection Time   06/06/11  5:39 PM      Component Value Range Status Comment   C difficile by pcr NEGATIVE  NEGATIVE  Final   URINE CULTURE     Status: Normal   Collection Time     06/07/11  4:55 AM      Component Value Range Status Comment   Specimen Description URINE, RANDOM   Final    Special Requests NONE   Final    Setup Time [id number]   Final    Colony Count 35,000 COLONIES/ML   Final    Culture ESCHERICHIA COLI   Final    Report Status 06/09/2011 FINAL   Final    Organism ID, Bacteria ESCHERICHIA COLI   Final     Studies/Results: No results found.  Medications: Scheduled Meds:   . conjugated estrogens  1 Applicatorful Vaginal Daily  . feeding supplement  1 Container Oral TID WC  . heparin  5,000 Units Subcutaneous Q8H  . Juven  1 packet Oral BID  . loperamide  2 mg Oral q12n4p  . mesalamine  1,200 mg Oral Q breakfast  . methylPREDNISolone (SOLU-MEDROL) injection  40 mg Intravenous Q8H  . miconazole  1 Applicatorful Vaginal QHS  . pantoprazole (PROTONIX) IV  40 mg Intravenous Q24H  . potassium chloride  10 mEq Intravenous Q1 Hr x 6  . potassium chloride  10 mEq Intravenous Q1 Hr x 3  . potassium chloride      .  DISCONTD: clotrimazole  1 Applicatorful Vaginal QHS  . DISCONTD: potassium chloride  10 mEq Intravenous Q1 Hr x 6   Continuous Infusions:   . sodium chloride 100 mL/hr at 06/09/11 1200   PRN Meds:.acetaminophen, acetaminophen, morphine, ondansetron (ZOFRAN) IV, oxyCODONE  Assessment/Plan:  Principal Problem:  *Diarrhea - clinically improving, continue IV solumedrol and follow up on GI recommendations  Active Problems:  Crohn's - per GI recommendations   Hypertension - well controlled   Hypokalemia - resolved, obtain BMP in AM   ARF (acute renal failure) - resolved    LOS: 4 days   MAGICK-Princesa Willig 06/09/2011, 1:33 PM

## 2011-06-09 NOTE — Progress Notes (Signed)
Pt's IV infiltrated after 1st KCL infusion by day shift RN.  Pt without IV access for few hours until IV team was available to restart.  Pt not able to tolerate KCL infusion at 149m/hr, so infusion decreased to 520mhr.  Had to reorder last 3 doses of KCL after midnight since the order should have been completed earlier and the system would not allow me to administer the medication.

## 2011-06-09 NOTE — Progress Notes (Signed)
  Subjective: She feels much better.  Decreased stool frequency and stool is forming.  Pain is better also.  Objective: Vital signs in last 24 hours: Temp:  [97.6 F (36.4 C)-97.7 F (36.5 C)] 97.7 F (36.5 C) (11/17 0528) Pulse Rate:  [59-62] 59  (11/17 0528) Resp:  [16-28] 20  (11/17 0528) BP: (102-137)/(59-80) 137/80 mmHg (11/17 0528) SpO2:  [92 %-100 %] 98 % (11/17 0528) Last BM Date: 06/08/11  Intake/Output from previous day: 11/16 0701 - 11/17 0700 In: 1835.3 [P.O.:360; I.V.:975.3; IV Piggyback:500] Out: -  Intake/Output this shift:    General appearance: alert and no distress Resp: clear to auscultation bilaterally Cardio: regular rate and rhythm, S1, S2 normal, no murmur, click, rub or gallop GI: Mild LLQ tenderness Extremities: extremities normal, atraumatic, no cyanosis or edema  Lab Results:  Basename 06/08/11 0500 06/07/11 0650  WBC 11.5* 19.5*  HGB 11.5* 12.3  HCT 34.4* 36.5  PLT 350 368   BMET  Basename 06/08/11 0500 06/07/11 0650  NA 134* 134*  K 3.4* 3.6  CL 107 106  CO2 19 18*  GLUCOSE 98 96  BUN 12 12  CREATININE 0.75 0.86  CALCIUM 8.3* 7.9*   LFT No results found for this basename: PROT,ALBUMIN,AST,ALT,ALKPHOS,BILITOT,BILIDIR,IBILI in the last 72 hours PT/INR No results found for this basename: LABPROT:2,INR:2 in the last 72 hours Hepatitis Panel  Basename 06/08/11 0500  HEPBSAG NEGATIVE  HCVAB --  HEPAIGM --  HEPBIGM --   C-Diff No results found for this basename: CDIFFTOX:3 in the last 72 hours Fecal Lactopherrin No results found for this basename: FECLLACTOFRN in the last 72 hours  Studies/Results: No results found.  Medications:  Scheduled:   . clotrimazole  1 Applicatorful Vaginal QHS  . conjugated estrogens  1 Applicatorful Vaginal Daily  . feeding supplement  1 Container Oral TID WC  . heparin  5,000 Units Subcutaneous Q8H  . Juven  1 packet Oral BID  . loperamide  2 mg Oral q12n4p  . mesalamine  1,200 mg Oral Q  breakfast  . methylPREDNISolone (SOLU-MEDROL) injection  40 mg Intravenous Q8H  . pantoprazole (PROTONIX) IV  40 mg Intravenous Q24H  . potassium chloride  10 mEq Intravenous Q1 Hr x 6  . potassium chloride  10 mEq Intravenous Q1 Hr x 3  . potassium chloride      . white petrolatum      . DISCONTD: antiseptic oral rinse  15 mL Mouth Rinse q12n4p  . DISCONTD: chlorhexidine  15 mL Mouth Rinse BID  . DISCONTD: cholestyramine  4 g Oral BID  . DISCONTD: ciprofloxacin  400 mg Intravenous Q12H  . DISCONTD: potassium chloride  10 mEq Intravenous Q1 Hr x 6  . DISCONTD: vancomycin  500 mg Oral Q6H   Continuous:   . sodium chloride 100 mL/hr at 06/09/11 0146    Assessment/Plan: 1) Crohn's disease.   Clinically she is much better.  Her pain is under better control and her stools appear to be forming.  Plan: 1) Continue with IV steroids.  LOS: 4 days   Rino Hosea D 06/09/2011, 7:09 AM

## 2011-06-10 DIAGNOSIS — R933 Abnormal findings on diagnostic imaging of other parts of digestive tract: Secondary | ICD-10-CM

## 2011-06-10 DIAGNOSIS — R197 Diarrhea, unspecified: Secondary | ICD-10-CM

## 2011-06-10 DIAGNOSIS — K5010 Crohn's disease of large intestine without complications: Secondary | ICD-10-CM

## 2011-06-10 LAB — CBC
Hemoglobin: 11.7 g/dL — ABNORMAL LOW (ref 12.0–15.0)
MCHC: 33.4 g/dL (ref 30.0–36.0)
RDW: 14.8 % (ref 11.5–15.5)

## 2011-06-10 LAB — BASIC METABOLIC PANEL
GFR calc Af Amer: >90 mL/min (ref >90)
GFR calc non Af Amer: 87 mL/min — ABNORMAL LOW (ref >90)
Potassium: 3.8 mEq/L (ref 3.5–5.1)
Sodium: 138 mEq/L (ref 135–145)

## 2011-06-10 MED ORDER — SODIUM CHLORIDE 0.9 % IV SOLN
5.0000 mg/kg | Freq: Once | INTRAVENOUS | Status: AC
  Administered 2011-06-10: 400 mg via INTRAVENOUS
  Filled 2011-06-10: qty 40

## 2011-06-10 MED ORDER — MESALAMINE 1.2 G PO TBEC
4.8000 g | DELAYED_RELEASE_TABLET | Freq: Every day | ORAL | Status: DC
  Administered 2011-06-10 – 2011-06-13 (×4): 4.8 g via ORAL
  Filled 2011-06-10 (×5): qty 4

## 2011-06-10 MED ORDER — ACETAMINOPHEN 325 MG PO TABS
650.0000 mg | ORAL_TABLET | Freq: Four times a day (QID) | ORAL | Status: DC | PRN
  Administered 2011-06-10: 650 mg via ORAL
  Filled 2011-06-10: qty 2

## 2011-06-10 MED ORDER — ACETAMINOPHEN 650 MG RE SUPP: 650.0000 mg | Freq: Four times a day (QID) | RECTAL | Status: DC | PRN

## 2011-06-10 MED ORDER — DIPHENHYDRAMINE HCL 25 MG PO CAPS
25.0000 mg | ORAL_CAPSULE | Freq: Once | ORAL | Status: AC
Start: 2011-06-10 — End: 2011-06-10
  Administered 2011-06-10: 25 mg via ORAL
  Filled 2011-06-10: qty 1

## 2011-06-10 NOTE — Progress Notes (Signed)
Subjective Feeling better but still having diarrhea. 4 bowel movements last night with visible blood  Objective: Vital signs in last 24 hours: Temp:  [97.7 F (36.5 C)-98.4 F (36.9 C)] 97.7 F (36.5 C) (11/18 0515) Pulse Rate:  [55-69] 55  (11/18 0515) Resp:  [16-18] 16  (11/18 0515) BP: (127-134)/(61-95) 134/67 mmHg (11/18 0515) SpO2:  [98 %-100 %] 98 % (11/18 0515) Last BM Date: 06/09/11 General:   Alert,  Well-developed, well-nourished, pleasant and cooperative in NAD Head:  Normocephalic and atraumatic. Eyes:  Sclera clear, no icterus.   Conjunctiva pink. Mouth:  No deformity or lesions, dentition normal. Neck:  Supple; no masses or thyromegaly. Heart:  Regular rate and rhythm; no murmurs, clicks, rubs,  or gallops. Abdomen:  Soft abdomen with mild tenderness left lower quadrant. Normal active bowel sounds. No distention  Msk:  Symmetrical without gross deformities. Normal posture. Pulses:  Normal pulses noted. Extremities:  Without clubbing or edema. Neurologic:  Alert and  oriented x4;  grossly normal neurologically. Skin:  Intact without significant lesions or rashes. Cervical Nodes:  No significant cervical adenopathy. Psych:  Alert and cooperative. Normal mood and affect. Stool it appears soft with visible specks of blood  Intake/Output from previous day: 11/17 0701 - 11/18 0700 In: 720 [P.O.:720] Out: -  Intake/Output this shift:    Lab Results:  Basename 06/10/11 0625 06/09/11 0805 06/08/11 0500  WBC 9.8 9.6 11.5*  HGB 11.7* 11.2* 11.5*  HCT 35.0* 34.2* 34.4*  PLT 284 310 350   BMET  Basename 06/10/11 0625 06/09/11 0805 06/08/11 0500  NA 138 138 134*  K 3.8 4.9 3.4*  CL 108 110 107  CO2 24 23 19   GLUCOSE 117* 132* 98  BUN 17 16 12   CREATININE 0.69 0.68 0.75  CALCIUM 8.4 8.4 8.3*   LFT No results found for this basename: PROT,ALBUMIN,AST,ALT,ALKPHOS,BILITOT,BILIDIR,IBILI in the last 72 hours PT/INR No results found for this basename:  LABPROT:2,INR:2 in the last 72 hours Hepatitis Panel  Basename 06/08/11 0500  HEPBSAG NEGATIVE  HCVAB --  HEPAIGM --  HEPBIGM --     Studies/Results: No results found.  Assessment: Principal Problem:  *Diarrhea Active Problems:  Crohn's  Hypertension  Hypokalemia  ARF (acute renal failure)   This 70 year old white female with the left-sided ulcerative colitis clinically improved on IV steroids. Although she is better and tolerating regular diet he is still having blood per rectum. Her PPD skin test was negative, she will be started on Remicade infusion 5 mg per kilogram.  Plan: Remicade 5 mg of kilogram Continue regular diet Continue the same steroid dose for now Continue Lialda 4.8 /day   LOS: 5 days   Delfin Edis  06/10/2011, 8:36 AM

## 2011-06-10 NOTE — Progress Notes (Signed)
Subjective: No events overnight.   Objective:  Vital signs in last 24 hours:  Filed Vitals:   06/09/11 0528 06/09/11 1400 06/09/11 2126 06/10/11 0515  BP: 137/80 128/95 127/61 134/67  Pulse: 59 69 56 55  Temp: 97.7 F (36.5 C) 98.4 F (36.9 C) 98 F (36.7 C) 97.7 F (36.5 C)  TempSrc: Oral Oral Oral Oral  Resp: 20 18 18 16   Height:      Weight:      SpO2: 98% 100% 100% 98%    Intake/Output from previous day:   Intake/Output Summary (Last 24 hours) at 06/10/11 1223 Last data filed at 06/09/11 1230  Gross per 24 hour  Intake    360 ml  Output      0 ml  Net    360 ml    Physical Exam: General: Alert, awake, oriented x3, in no acute distress. HEENT: No bruits, no goiter. Heart: Regular rate and rhythm, without murmurs, rubs, gallops. Lungs: Clear to auscultation bilaterally. Abdomen: Soft, nontender, nondistended, positive bowel sounds. Extremities: No clubbing cyanosis or edema with positive pedal pulses. Neuro: Grossly intact, nonfocal.   Lab Results:  Basic Metabolic Panel:    Component Value Date/Time   NA 138 06/10/2011 0625   K 3.8 06/10/2011 0625   CL 108 06/10/2011 0625   CO2 24 06/10/2011 0625   BUN 17 06/10/2011 0625   CREATININE 0.69 06/10/2011 0625   GLUCOSE 117* 06/10/2011 0625   CALCIUM 8.4 06/10/2011 0625   CBC:    Component Value Date/Time   WBC 9.8 06/10/2011 0625   HGB 11.7* 06/10/2011 0625   HCT 35.0* 06/10/2011 0625   PLT 284 06/10/2011 0625   MCV 94.1 06/10/2011 0625   NEUTROABS 14.5* 06/01/2011 1955   LYMPHSABS 0.6* 06/01/2011 1955   MONOABS 1.0 06/01/2011 1955   EOSABS 0.0 06/01/2011 1955   BASOSABS 0.0 06/01/2011 1955    Recent Results (from the past 240 hour(s))  CLOSTRIDIUM DIFFICILE BY PCR     Status: Normal   Collection Time   06/06/11  5:39 PM      Component Value Range Status Comment   C difficile by pcr NEGATIVE  NEGATIVE  Final   URINE CULTURE     Status: Normal   Collection Time   06/07/11  4:55 AM   Component Value Range Status Comment   Specimen Description URINE, RANDOM   Final    Special Requests NONE   Final    Setup Time [id number]   Final    Colony Count 35,000 COLONIES/ML   Final    Culture ESCHERICHIA COLI   Final    Report Status 06/09/2011 FINAL   Final    Organism ID, Bacteria ESCHERICHIA COLI   Final     Studies/Results: No results found.  Medications: Scheduled Meds:   . conjugated estrogens  1 Applicatorful Vaginal Daily  . diphenhydrAMINE  25 mg Oral Once  . feeding supplement  1 Container Oral TID WC  . heparin  5,000 Units Subcutaneous Q8H  . inFLIXimab (REMICADE) infusion  5 mg/kg Intravenous Once  . Juven  1 packet Oral BID  . loperamide  2 mg Oral q12n4p  . mesalamine  4.8 g Oral Q breakfast  . methylPREDNISolone (SOLU-MEDROL) injection  40 mg Intravenous Q8H  . miconazole  1 Applicatorful Vaginal QHS  . pantoprazole  40 mg Oral Q1200  . DISCONTD: mesalamine  1,200 mg Oral Q breakfast  . DISCONTD: pantoprazole (PROTONIX) IV  40 mg Intravenous Q24H  Continuous Infusions:   . DISCONTD: sodium chloride Stopped (06/09/11 1333)   PRN Meds:.acetaminophen, acetaminophen, morphine, ondansetron (ZOFRAN) IV, oxyCODONE, DISCONTD: acetaminophen, DISCONTD: acetaminophen  Assessment/Plan:  Principal Problem:  *Diarrhea - now clinically improving, cont current medication regimen as recommended by GI  Active Problems:  Crohn's - continue to follow GI recommendations   Hypertension - well controlled    Hypokalemia - adequately supplemented and now resolved   ARF (acute renal failure) - creatinine within normal limits, ARF resolved   Disposition - anticipate d/c in AM if GI agrees    LOS: 5 days   MAGICK-Avel Ogawa 06/10/2011, 12:23 PM

## 2011-06-11 ENCOUNTER — Encounter: Payer: Self-pay | Admitting: Internal Medicine

## 2011-06-11 LAB — GLUCOSE, CAPILLARY
Glucose-Capillary: 113 mg/dL — ABNORMAL HIGH (ref 70–99)
Glucose-Capillary: 192 mg/dL — ABNORMAL HIGH (ref 70–99)
Glucose-Capillary: 170 mg/dL — ABNORMAL HIGH (ref 70–99)

## 2011-06-11 MED ORDER — LOPERAMIDE HCL 2 MG PO CAPS
4.0000 mg | ORAL_CAPSULE | Freq: Two times a day (BID) | ORAL | Status: DC
  Administered 2011-06-11 – 2011-06-13 (×5): 4 mg via ORAL
  Filled 2011-06-11 (×2): qty 2
  Filled 2011-06-11: qty 1
  Filled 2011-06-11 (×2): qty 2

## 2011-06-11 NOTE — Progress Notes (Signed)
Attending MD:  Continues to improve, only one BM last night. Plan to keep 24-48 hours pending continued improvement. Encourage ambulation.

## 2011-06-11 NOTE — Progress Notes (Signed)
Patient ID: [name] [name], female   DOB: 1941-01-28, 70 y.o.   MRN: [id number]  Subjective: No events overnight.   Objective:  Vital signs in last 24 hours:  Filed Vitals:   06/10/11 2136 06/11/11 0531 06/11/11 1323 06/11/11 2125  BP: 139/78 146/81 151/75 153/86  Pulse: 74 58 60 59  Temp: 97.4 F (36.3 C) 97.2 F (36.2 C) 98.1 F (36.7 C) 98 F (36.7 C)  TempSrc: Oral Oral    Resp: 18 18 20 18   Height:      Weight:      SpO2: 100% 97% 98% 98%    Intake/Output from previous day:   Intake/Output Summary (Last 24 hours) at 06/11/11 2341 Last data filed at 06/11/11 1900  Gross per 24 hour  Intake    840 ml  Output      0 ml  Net    840 ml    Physical Exam: General: Alert, awake, oriented x3, in no acute distress. HEENT: No bruits, no goiter. Heart: Regular rate and rhythm, without murmurs, rubs, gallops. Lungs: Clear to auscultation bilaterally. Abdomen: Soft, nontender, nondistended, positive bowel sounds. Extremities: No clubbing cyanosis or edema with positive pedal pulses. Neuro: Grossly intact, nonfocal.   Lab Results:  Basic Metabolic Panel:    Component Value Date/Time   NA 138 06/10/2011 0625   K 3.8 06/10/2011 0625   CL 108 06/10/2011 0625   CO2 24 06/10/2011 0625   BUN 17 06/10/2011 0625   CREATININE 0.69 06/10/2011 0625   GLUCOSE 117* 06/10/2011 0625   CALCIUM 8.4 06/10/2011 0625   CBC:    Component Value Date/Time   WBC 9.8 06/10/2011 0625   HGB 11.7* 06/10/2011 0625   HCT 35.0* 06/10/2011 0625   PLT 284 06/10/2011 0625   MCV 94.1 06/10/2011 0625   NEUTROABS 14.5* 06/01/2011 1955   LYMPHSABS 0.6* 06/01/2011 1955   MONOABS 1.0 06/01/2011 1955   EOSABS 0.0 06/01/2011 1955   BASOSABS 0.0 06/01/2011 1955    Recent Results (from the past 240 hour(s))  CLOSTRIDIUM DIFFICILE BY PCR     Status: Normal   Collection Time   06/06/11  5:39 PM      Component Value Range Status Comment   C difficile by pcr NEGATIVE  NEGATIVE  Final   URINE CULTURE      Status: Normal   Collection Time   06/07/11  4:55 AM      Component Value Range Status Comment   Specimen Description URINE, RANDOM   Final    Special Requests NONE   Final    Setup Time [id number]   Final    Colony Count 35,000 COLONIES/ML   Final    Culture ESCHERICHIA COLI   Final    Report Status 06/09/2011 FINAL   Final    Organism ID, Bacteria ESCHERICHIA COLI   Final     Studies/Results: No results found.  Medications: Scheduled Meds:   . conjugated estrogens  1 Applicatorful Vaginal Daily  . feeding supplement  1 Container Oral TID WC  . heparin  5,000 Units Subcutaneous Q8H  . Juven  1 packet Oral BID  . loperamide  4 mg Oral BID  . mesalamine  4.8 g Oral Q breakfast  . methylPREDNISolone (SOLU-MEDROL) injection  40 mg Intravenous Q8H  . miconazole  1 Applicatorful Vaginal QHS  . pantoprazole  40 mg Oral Q1200  . DISCONTD: loperamide  2 mg Oral q12n4p   Continuous Infusions:  PRN Meds:.acetaminophen, acetaminophen, morphine, ondansetron (  ZOFRAN) IV, oxyCODONE  Assessment/Plan:  Principal Problem:  *Diarrhea - clinically improving, continue same medication regimen for now  Active Problems:  Crohn's - follow up on GI recommendations   Hypertension - well controlled on current medication regimen   Hypokalemia - resolved   ARF (acute renal failure) - resolved   Disposition - obtain PT evaluation and anticipate d/c in 1-2 days    LOS: 6 days   MAGICK-MYERS, ISKRA 06/11/2011, 11:41 PM

## 2011-06-11 NOTE — Progress Notes (Signed)
Clinical social work received referral for advanced directives. Patient had requested notary for Adv. Directives, however the advance directives presented to CSW was not appropriate. CSW provided patient with [facility] Advanced Directives. Pt to notify nursing staff when patient ready to complete advance directive over the weekend with CSW. CSW continuing to follow.   Dorathy Kinsman, LCSWA  .06/11/2011 16:45

## 2011-06-11 NOTE — Progress Notes (Signed)
[facility] Progress Note  Subjective: Feels better, less diarrhea, stools forming but still contain blood. No significant abdominal pain just lower abdominal  discomfort with defecation. Eating solids  Objective: Vital signs in last 24 hours: Temp:  [97.2 F (36.2 C)-97.7 F (36.5 C)] 97.2 F (36.2 C) (11/19 0531) Pulse Rate:  [58-74] 58  (11/19 0531) Resp:  [18] 18  (11/19 0531) BP: (131-146)/(60-81) 146/81 mmHg (11/19 0531) SpO2:  [97 %-100 %] 97 % (11/19 0531) Last BM Date: 06/10/11 General:   Alert,  well-nourished white female in NAD Heart:  Regular rate and rhythm Abdomen:  Soft, nontender and nondistended.  Normal bowel sounds, without guarding, and without rebound.   Extremities:  1+ bilateral lower extremity edema. Neurologic:  Alert and  oriented x4;  grossly normal neurologically. Psych:  Alert and cooperative. Normal mood and affect.   Lab Results:  [facility] 06/10/11 0625 06/09/11 0805  WBC 9.8 9.6  HGB 11.7* 11.2*  HCT 35.0* 34.2*  PLT 284 310   BMET  Basename 06/10/11 0625 06/09/11 0805  NA 138 138  K 3.8 4.9  CL 108 110  CO2 24 23  GLUCOSE 117* 132*  BUN 17 16  CREATININE 0.69 0.68  CALCIUM 8.4 8.4   Assessment / Plan: 1. Severe Crohn's colitis, improving on Solumedrol, Mesalamin. Received first Remicade dose yesterday. Continue current course, will transition her to oral steroids soon.  2. Mild debility - ambulate in halls with assist.    LOS: 6 days   [name]  06/11/2011, 9:20 AM

## 2011-06-12 ENCOUNTER — Encounter: Payer: Self-pay | Admitting: Internal Medicine

## 2011-06-12 DIAGNOSIS — R197 Diarrhea, unspecified: Secondary | ICD-10-CM

## 2011-06-12 DIAGNOSIS — K5010 Crohn's disease of large intestine without complications: Secondary | ICD-10-CM

## 2011-06-12 DIAGNOSIS — R933 Abnormal findings on diagnostic imaging of other parts of digestive tract: Secondary | ICD-10-CM

## 2011-06-12 LAB — BASIC METABOLIC PANEL
CO2: 26 mEq/L (ref 19–32)
Calcium: 8.4 mg/dL (ref 8.4–10.5)
Chloride: 103 mEq/L (ref 96–112)
Creatinine, Ser: 0.61 mg/dL (ref 0.50–1.10)
GFR calc Af Amer: >90 mL/min (ref >90)
Sodium: 137 mEq/L (ref 135–145)

## 2011-06-12 LAB — CBC
MCV: 93.1 fL (ref 78.0–100.0)
Platelets: 260 10*3/uL (ref 150–400)
RBC: 3.91 MIL/uL (ref 3.87–5.11)
RDW: 14.5 % (ref 11.5–15.5)
WBC: 17.1 10*3/uL — ABNORMAL HIGH (ref 4.0–10.5)

## 2011-06-12 LAB — GLUCOSE, CAPILLARY
Glucose-Capillary: 86 mg/dL (ref 70–99)
Glucose-Capillary: 204 mg/dL — ABNORMAL HIGH (ref 70–99)

## 2011-06-12 MED ORDER — INSULIN ASPART 100 UNIT/ML ~~LOC~~ SOLN
0.0000 [IU] | SUBCUTANEOUS | Status: DC
  Administered 2011-06-13 (×2): 2 [IU] via SUBCUTANEOUS
  Administered 2011-06-13: 1 [IU] via SUBCUTANEOUS
  Filled 2011-06-12: qty 3

## 2011-06-12 MED ORDER — POTASSIUM CHLORIDE CRYS ER 20 MEQ PO TBCR
20.0000 meq | EXTENDED_RELEASE_TABLET | Freq: Three times a day (TID) | ORAL | Status: DC
  Administered 2011-06-12 – 2011-06-13 (×4): 20 meq via ORAL
  Filled 2011-06-12 (×6): qty 1

## 2011-06-12 NOTE — Progress Notes (Signed)
[facility] Progress Note  Subjective: Feels better but lunch yesterday "tore stomach up" with diarrhea. Stools not formed but no totally loose, still some blood.   Objective:  Vital signs in last 24 hours: Temp:  [98 F (36.7 C)-98.2 F (36.8 C)] 98.2 F (36.8 C) (11/20 0620) Pulse Rate:  [59-64] 64  (11/20 0620) Resp:  [18-20] 18  (11/20 0620) BP: (151-164)/(75-86) 164/78 mmHg (11/20 0620) SpO2:  [98 %-100 %] 98 % (11/20 0830) Last BM Date: 06/12/11 General:   Alert,  Well-developed,  white female in NAD Heart:  Regular rate and rhythm; no murmurs Abdomen:  Soft, nontender and nondistended. Normal bowel sounds, without guarding, and without rebound.   Extremities:  Without edema. Neurologic:  Alert and  oriented x4;  grossly normal neurologically. Psych:  Alert and cooperative. Normal mood and affect.  Intake/Output from previous day: 11/19 0701 - 11/20 0700 In: 840 [P.O.:840] Out: -  Intake/Output this shift:    Lab Results:  Basename 06/12/11 0615 06/10/11 0625  WBC 17.1* 9.8  HGB 12.3 11.7*  HCT 36.4 35.0*  PLT 260 284   BMET  Basename 06/12/11 0615 06/10/11 0625  NA 137 138  K 2.9* 3.8  CL 103 108  CO2 26 24  GLUCOSE 88 117*  BUN 22 17  CREATININE 0.61 0.69  CALCIUM 8.4 8.4     Assessment / Plan: 1. Severe crohn's colitis, biopsies c/w chronic active colitis without dysplasia. She has shown significant improvement with steroids and also received Remicade infusion. Change to low fiber diet. Hopefully home in next day or so. 2. Hypokalemia, IV potassium burns her so I will refrain from ordering. Hospitalist can make replete orally    LOS: 7 days   [name]  06/12/2011, 9:21 AM

## 2011-06-12 NOTE — Progress Notes (Signed)
leukocytosis and more diarrhea yesterday, not well yet, although she is feeling better. K+ depletion c/w voluminous diarrhea, will reduce her diet, replete K+

## 2011-06-12 NOTE — Progress Notes (Addendum)
Physical Therapy Evaluation Patient Details Name: [place] MRN: [id number] DOB: [birth year] Today's Date: 06/12/2011  Problem List:  Patient Active Problem List  Diagnoses  . Crohn's  . Hypertension  . Arthritis  . Diarrhea  . Hypokalemia  . ARF (acute renal failure)    Past Medical History:  Past Medical History  Diagnosis Date  . Crohn's   . Hypertension   . Crohn's   . S/P appy   . S/P laparoscopic cholecystectomy   . Colon polyps     adenomatous polyp June 2012  . Complication of anesthesia     slow to wake  . Shortness of breath   . Blood transfusion     1966  . Headache     hx migraines  . Arthritis     shoulder   Past Surgical History:  Past Surgical History  Procedure Date  . Cholecystectomy   . Abdominal hysterectomy   . Tonsillectomy   . Joint replacement   . Foot ganglion excision   . Appendectomy     PT Assessment/Plan/Recommendation PT Assessment Clinical Impression Statement: Pt does well however has great difficulty with standing from low height surfaces such as a straight back chair requiring mod assist to stand with repeated attempts. PT recommending 24 hour supervision initially secondary to this. Feel pt would benefit from HHPT to continue to address strength deficits and decrease reliance on RW. Pt advised to have son keep her dogs away from her while ambulating secondary to h/o falls secondary to getting tripped by dogs. Pt verbalized understanding.   PT Recommendation/Assessment: Patient will need skilled PT in the acute care venue PT Problem List: Decreased strength;Decreased balance;Decreased activity tolerance;Decreased knowledge of use of DME Problem List Comments: Reliance on RW.  PT Therapy Diagnosis : Difficulty walking;Abnormality of gait;Generalized weakness PT Plan PT Frequency: Min 3X/week PT Treatment/Interventions: DME instruction;Gait training;Stair training;Functional mobility training;Therapeutic exercise;Balance  training;Patient/family education PT Recommendation Follow Up Recommendations: Home health PT;24 hour supervision/assistance Equipment Recommended: 3 in 1 bedside comode (PT to fit her RW once she brings it in. ) PT Goals  Acute Rehab PT Goals PT Goal Formulation: With patient Time For Goal Achievement: 2 weeks Pt will Transfer Sit to Stand/Stand to Sit: with modified independence;with upper extremity assist (from standard straight back chair) PT Transfer Goal: Sit to Stand/Stand to Sit - Progress: Progressing toward goal Pt will Transfer Bed to Chair/Chair to Bed: with modified independence PT Transfer Goal: Bed to Chair/Chair to Bed - Progress: Progressing toward goal Pt will Ambulate: >150 feet;with modified independence;with cane PT Goal: Ambulate - Progress: Progressing toward goal Pt will Go Up / Down Stairs: 3-5 stairs;with modified independence PT Goal: Up/Down Stairs - Progress: Progressing toward goal Pt will Perform Home Exercise Program: Independently PT Goal: Perform Home Exercise Program - Progress: Progressing toward goal  PT Evaluation Precautions/Restrictions  Restrictions Weight Bearing Restrictions: No Prior Functioning  Home Living Lives With: Clarene Duke Help From: Family Type of Home: House Home Layout: One level Home Access: Ramped entrance;Stairs to enter (uses both) Entrance Stairs-Rails: Right Entrance Stairs-Number of Steps: 5, 4 Bathroom Shower/Tub: Tub/shower unit;Walk-in shower Bathroom Toilet: Standard Bathroom Accessibility: Yes How Accessible: Accessible via walker Home Adaptive Equipment: Walker - rolling;Quad cane (husband's - has passed away) Prior Function Level of Independence: Independent with basic ADLs Driving: Yes Vocation: Retired Comments: PTA pt was very active and walked her 4 dogs regularly outside [employer] Arousal/Alertness: Awake/alert Overall Cognitive Status: Appears within functional limits for tasks  assessed Orientation Level: Oriented X4 Sensation/Coordination Sensation Light Touch: Appears Intact Coordination Gross Motor Movements are Fluid and Coordinated: Yes Fine Motor Movements are Fluid and Coordinated: Yes Extremity Assessment RLE Assessment RLE Assessment: Exceptions to [facility] RLE AROM (degrees) Overall AROM Right Lower Extremity: Within functional limits for tasks assessed RLE Strength RLE Overall Strength Comments: Functional strength demonstrating significant deficits Right Hip ABduction: 3+/5 Right Hip ADduction: 3/5 Right Knee Flexion: 3-/5 Right Knee Extension: 3/5 Right Ankle Dorsiflexion: 3-/5 LLE AROM (degrees) Overall AROM Left Lower Extremity: Within functional limits for tasks assessed LLE Strength Left Hip ABduction: 3+/5 Left Hip ADduction: 3/5 Left Knee Flexion: 3-/5 Left Knee Extension: 3/5 Left Ankle Dorsiflexion: 3-/5 Mobility (including Balance) Bed Mobility Bed Mobility: Yes Supine to Sit: 6: Modified independent (Device/Increase time) Sitting - Scoot to Edge of Bed: 6: Modified independent (Device/Increase time) Transfers Transfers: Yes Sit to Stand: 5: Supervision;3: Mod assist;With armrests;From bed;From chair/3-in-1 Sit to Stand Details (indicate cue type and reason): Supervision from bed, Mod assist from regular height chair with armrests. Verbal cues for positioning of lower/upper extremities. Pt requires multiple attempts from chair.  Stand to Sit: 4: Min assist;5: Supervision Stand to Sit Details: Supervision to higher surfaces, min assist to regular height standard chair. Verbal cues for control of descent, use of armrests.  Ambulation/Gait Ambulation/Gait: Yes Ambulation/Gait Assistance: 5: Supervision Ambulation/Gait Assistance Details (indicate cue type and reason): Verbal cues for improved posture, holding head up. Supervision secondary to weakness.  Ambulation Distance (Feet): 200 Feet Assistive device: Rolling walker Gait  Pattern: Trunk flexed (decreased foot clearance bil. ) Gait velocity: 1.71f/sec - very slow gait Stairs: No  Posture/Postural Control Posture/Postural Control: No significant limitations High Level Balance High Level Balance Comments: Balance not officially tested, suspect deficits given pt requires a RW for ambulation. Will test more formally at later time.  Exercise  Other Exercises Other Exercises: Sit/stand from straight chair with armrests; performed 3x for strengthening.  End of Session PT - End of Session Equipment Utilized During Treatment: Gait belt Activity Tolerance: Patient tolerated treatment well;Patient limited by fatigue Patient left: in chair;with call bell in reach General Behavior During Session: [facility]for tasks performed Cognition: W[facility] Centerfor tasks performed  COrlean Bradford11/20/2012, 9:03 AM  HOrlean Bradford GOswaldo ConroyPT, DPT Acute Rehabilitation ([phone]

## 2011-06-12 NOTE — Progress Notes (Signed)
Patient ID: [name] [name], female   DOB: January 02, 1941, 70 y.o.   MRN: [id number]  Subjective: No events overnight. Pt reports improvement in diarrhea. She has had physical therapy today and reports tolerating therapy well. Also tolerating current diet.  Objective:  Vital signs in last 24 hours:  Filed Vitals:   06/11/11 2125 06/12/11 0620 06/12/11 0830 06/12/11 1503  BP: 153/86 164/78  125/65  Pulse: 59 64  65  Temp: 98 F (36.7 C) 98.2 F (36.8 C)  97.6 F (36.4 C)  TempSrc:    Oral  Resp: 18 18  18   Height:      Weight:      SpO2: 98% 100% 98% 100%    Intake/Output from previous day:   Intake/Output Summary (Last 24 hours) at 06/12/11 2134 Last data filed at 06/12/11 1703  Gross per 24 hour  Intake      0 ml  Output    400 ml  Net   -400 ml    Physical Exam: General: Alert, awake, oriented x3, in no acute distress. HEENT: No bruits, no goiter. Heart: Regular rate and rhythm, without murmurs, rubs, gallops. Lungs: Clear to auscultation bilaterally. Abdomen: Soft, nontender, nondistended, positive bowel sounds. Extremities: No clubbing cyanosis or edema with positive pedal pulses. Neuro: Grossly intact, nonfocal.   Lab Results:  Basic Metabolic Panel:    Component Value Date/Time   NA 137 06/12/2011 0615   K 2.9* 06/12/2011 0615   CL 103 06/12/2011 0615   CO2 26 06/12/2011 0615   BUN 22 06/12/2011 0615   CREATININE 0.61 06/12/2011 0615   GLUCOSE 88 06/12/2011 0615   CALCIUM 8.4 06/12/2011 0615   CBC:    Component Value Date/Time   WBC 17.1* 06/12/2011 0615   HGB 12.3 06/12/2011 0615   HCT 36.4 06/12/2011 0615   PLT 260 06/12/2011 0615   MCV 93.1 06/12/2011 0615   NEUTROABS 14.5* 06/01/2011 1955   LYMPHSABS 0.6* 06/01/2011 1955   MONOABS 1.0 06/01/2011 1955   EOSABS 0.0 06/01/2011 1955   BASOSABS 0.0 06/01/2011 1955    Recent Results (from the past 240 hour(s))  CLOSTRIDIUM DIFFICILE BY PCR     Status: Normal   Collection Time   06/06/11  5:39 PM      Component Value Range Status Comment   C difficile by pcr NEGATIVE  NEGATIVE  Final   URINE CULTURE     Status: Normal   Collection Time   06/07/11  4:55 AM      Component Value Range Status Comment   Specimen Description URINE, RANDOM   Final    Special Requests NONE   Final    Setup Time [id number]   Final    Colony Count 35,000 COLONIES/ML   Final    Culture ESCHERICHIA COLI   Final    Report Status 06/09/2011 FINAL   Final    Organism ID, Bacteria ESCHERICHIA COLI   Final     Studies/Results: No results found.  Medications: Scheduled Meds:   . conjugated estrogens  1 Applicatorful Vaginal Daily  . feeding supplement  1 Container Oral TID WC  . heparin  5,000 Units Subcutaneous Q8H  . insulin aspart  0-9 Units Subcutaneous Q4H  . Juven  1 packet Oral BID  . loperamide  4 mg Oral BID  . mesalamine  4.8 g Oral Q breakfast  . methylPREDNISolone (SOLU-MEDROL) injection  40 mg Intravenous Q8H  . miconazole  1 Applicatorful Vaginal QHS  . pantoprazole  40 mg Oral Q1200  . potassium chloride  20 mEq Oral TID   Continuous Infusions:  PRN Meds:.acetaminophen, acetaminophen, morphine, ondansetron (ZOFRAN) IV, oxyCODONE  Assessment/Plan:  Principal Problem:  *Diarrhea - secondary to Crohn's colitis - GI following - continue infliximab as recommended by GI - pt will follow up in outpatient setting with Dr. Olevia Perches, see follow up section on discharge tab, appointment already scheduled  Active Problems:  Crohn's - continue infliximab and methylprednisolone as recommended by GI   Hypertension - well controlled on current medication regimen   Hypokalemia - continue to replete as indicated   ARF (acute renal failure) - resolved  Hyperglycemia - secondary to methylprednisolone most likely - initiate SSI, sensitive coverage   Disposition  - anticipate d/c in 1-2 days - pt will go home with [facility] PT as recommended by PT - this was already set up and arranged   LOS:  7 days   MAGICK-Asani Deniston 06/12/2011, 9:34 PM

## 2011-06-13 LAB — BASIC METABOLIC PANEL
CO2: 26 mEq/L (ref 19–32)
Calcium: 8.4 mg/dL (ref 8.4–10.5)
Creatinine, Ser: 0.52 mg/dL (ref 0.50–1.10)
GFR calc non Af Amer: >90 mL/min (ref >90)
Glucose, Bld: 132 mg/dL — ABNORMAL HIGH (ref 70–99)
Sodium: 136 mEq/L (ref 135–145)

## 2011-06-13 LAB — CBC
Hemoglobin: 11.1 g/dL — ABNORMAL LOW (ref 12.0–15.0)
MCH: 31.1 pg (ref 26.0–34.0)
MCHC: 33.5 g/dL (ref 30.0–36.0)
MCV: 92.7 fL (ref 78.0–100.0)
RBC: 3.57 MIL/uL — ABNORMAL LOW (ref 3.87–5.11)

## 2011-06-13 MED ORDER — PREDNISONE 10 MG PO TABS: ORAL_TABLET | ORAL | Status: DC

## 2011-06-13 NOTE — Progress Notes (Signed)
Clinical social work spoke with patient regarding advanced directives packet. Pt had requested notary for advanced directives for durable power of attorney that was drawn by patient personal lawyer. Clinical social worker explained that  The Clinical social workers could not notarize Durable POA due to [employer]. Pt was offered the [facility] Advanced directives however pt declined resource. Pt stated she would have her own POA paper work notarized by a Psychologist, educational. .No further Clinical Social Work needs, signing off.   Dorathy Kinsman, [place] .06/13/2011 14:38pm

## 2011-06-13 NOTE — Progress Notes (Signed)
[facility] Progress Note  Subjective: Feels pretty well today, only one loose stool.   Objective:  Vital signs in last 24 hours: Temp:  [97.6 F (36.4 C)-97.8 F (36.6 C)] 97.8 F (36.6 C) (11/21 0600) Pulse Rate:  [57-65] 57  (11/21 0600) Resp:  [18-20] 18  (11/21 0600) BP: (125-152)/(65-74) 152/74 mmHg (11/21 0600) SpO2:  [98 %-100 %] 99 % (11/21 0600) Last BM Date: 06/12/11 General:   Alert,  Well-developed,  white female in NAD Heart:  Regular rate and rhythm; no murmurs Abdomen:  Soft, nontender and nondistended. Normal bowel sounds, without guarding, and without rebound.   Extremities:  Without edema. Neurologic:  Alert and  oriented x4;  grossly normal neurologically. Psych:  Alert and cooperative. Normal mood and affect.    Lab Results:  [facility] 06/13/11 0545 06/12/11 0615  WBC 13.4* 17.1*  HGB 11.1* 12.3  HCT 33.1* 36.4  PLT 225 260   BMET  Basename 06/13/11 0545 06/12/11 0615  NA 136 137  K 4.0 2.9*  CL 104 103  CO2 26 26  GLUCOSE 132* 88  BUN 24* 22  CREATININE 0.52 0.61  CALCIUM 8.4 8.4    Assessment / Plan: 1. Severe crohn's colitis, biopsies c/w chronic active colitis without dysplasia. She has shown significant improvement with steroids and also received a Remicade infusion. She is for discharge today. Patient would like to change from Dr. Melina Copa to Dr. Carlean Purl. I have spoken with both physicians and they are agreeable. We will arrange for her 2 week Remicade infusion. Patient needs to go home on Prednisone 55m daily for one week, reduce to 381mand hold there until follow up appointment with Dr. GeCarlean Purln 07/09/11.  2. Hypokalemia, resolved    LOS: 8 days   PaTye Savoy11/21/2012, I have personally reviewed the above note and agree with the plan of care.Arrandement made for [facility]. She is improved but not well. DoDelfin EdisMD

## 2011-06-13 NOTE — Progress Notes (Signed)
Physical Therapy Treatment Patient Details Name: Grove MRN: [id number] DOB: [birth year] Today's Date: 06/13/2011  PT Assessment/Plan  PT - Assessment/Plan Comments on Treatment Session: Pt progressing and better able to contribute to sit to stand from low chair however still requires mod assist and unable to perform by herself.  PT Plan: Discharge plan remains appropriate PT Frequency: Min 3X/week Follow Up Recommendations: Home health PT;24 hour supervision/assistance Equipment Recommended: 3 in 1 bedside comode (PT to fit her RW once she brings it in. ) PT Goals  Acute Rehab PT Goals PT Goal Formulation: With patient Time For Goal Achievement: 2 weeks Pt will Transfer Sit to Stand/Stand to Sit: with modified independence;with upper extremity assist (from standard straight back chair) PT Transfer Goal: Sit to Stand/Stand to Sit - Progress: Progressing toward goal Pt will Transfer Bed to Chair/Chair to Bed: with modified independence PT Transfer Goal: Bed to Chair/Chair to Bed - Progress: Progressing toward goal Pt will Ambulate: >150 feet;with modified independence;with cane PT Goal: Ambulate - Progress: Progressing toward goal Pt will Go Up / Down Stairs: 3-5 stairs;with modified independence PT Goal: Up/Down Stairs - Progress: Progressing toward goal Pt will Perform Home Exercise Program: Independently PT Goal: Perform Home Exercise Program - Progress: Progressing toward goal  PT Treatment Precautions/Restrictions  Restrictions Weight Bearing Restrictions: No Mobility (including Balance) Bed Mobility Bed Mobility: Yes Supine to Sit: 6: Modified independent (Device/Increase time) Sitting - Scoot to Edge of Bed: 6: Modified independent (Device/Increase time) Transfers Transfers: Yes Sit to Stand: 5: Supervision;3: Mod assist;With armrests;From bed;From chair/3-in-1 Sit to Stand Details (indicate cue type and reason): supervision from bed, Mod assist from low surface  (pt contributing more than yesterday however) Stand to Sit: 5: Supervision Stand to Sit Details: Verbal cues for UE placement, pt tends to plop in chair.  Ambulation/Gait Ambulation/Gait: Yes Ambulation/Gait Assistance: 5: Supervision Ambulation/Gait Assistance Details (indicate cue type and reason): Verbal cues for increased gait speed Ambulation Distance (Feet): 75 Feet (pt reporting she is going to walk with nursing 3x today) Assistive device: Rolling walker Gait Pattern: Trunk flexed;Decreased stride length (decreased foot clearance bil. ) Stairs: Yes Stairs Assistance: 4: Min assist Stairs Assistance Details (indicate cue type and reason): assist secondary to quad weakness, verbal cues for sequence and safety Stair Management Technique: Two rails;Step to pattern Number of Stairs: 6  (ascend/descend) Height of Stairs: 6     Exercise  Other Exercises Other Exercises: Sit/stand from straight chair with armrests; performed 3x for strengthening. Mod assist required.  End of Session PT - End of Session Equipment Utilized During Treatment: Gait belt Activity Tolerance: Patient tolerated treatment well;Patient limited by fatigue Patient left: in chair;with call bell in reach General Behavior During Session: [facility] for tasks performed Cognition: [facility] for tasks performed  Orlean Bradford 06/13/2011, 12:10 PM  Orlean Bradford) Oswaldo Conroy PT, DPT Acute Rehabilitation [phone]

## 2011-06-13 NOTE — Discharge Summary (Signed)
[name] [name], 70 y.o., DOB 1940-09-29, MRN [id number]. Admission date: 06/05/2011 Discharge Date 06/13/2011 Primary MD No primary provider on file. 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 Crohn's   . Hypertension   . Crohn's   . S/P appy   . S/P laparoscopic cholecystectomy   . Colon polyps     adenomatous polyp June 2012  . Complication of anesthesia     slow to wake  . Shortness of breath   . Blood transfusion     1966  . Headache     hx migraines  . Arthritis     shoulder    Past Surgical History  Procedure Date  . Cholecystectomy   . Abdominal hysterectomy   . Tonsillectomy   . Joint replacement   . Foot ganglion excision   . Appendectomy     Consults  [facility]  Significant Tests:  See full reports for all details    Ct Abdomen Pelvis W Contrast  06/07/2011  *RADIOLOGY REPORT*  Clinical Data: Left lower quadrant abdominal pain and nausea. History of Crohn's disease.  CT ABDOMEN AND PELVIS WITH CONTRAST  Technique:  Multidetector CT imaging of the abdomen and pelvis was performed following the standard protocol during bolus administration of intravenous contrast.  Contrast: 187m OMNIPAQUE IOHEXOL 300 MG/ML IV SOLN  Comparison: MRI of the lumbar spine performed 06/26/2008  Findings: Minimal bibasilar atelectasis is noted.  The liver is unremarkable in appearance.  The patient is status post cholecystectomy, with clips noted at the gallbladder fossa. The spleen is normal in appearance.  The pancreas and adrenal glands are unremarkable.  The kidneys are unremarkable in appearance.  Mild nonspecific  perinephric stranding is noted bilaterally.  No renal or ureteral stones are seen.  There is no evidence of hydronephrosis. Bilateral extrarenal pelves are seen.  No free fluid is identified.  The small bowel is unremarkable in appearance.  The stomach is within normal limits.  No acute vascular abnormalities are seen.  Mild scattered calcification is noted along the abdominal aorta and its branches.  The appendix is not seen; there is no evidence for appendicitis. There is minimal relatively diffuse colonic wall thickening, with segmental areas of sparing, likely reflecting chronic changes from the patient's Crohn's disease.  No definite Crohn's exacerbation is characterized.  The colon is otherwise grossly unremarkable in appearance.  The bladder is moderately distended and unremarkable in appearance. The patient is status post hysterectomy; no suspicious adnexal masses are seen.  No inguinal lymphadenopathy is seen.  No acute osseous abnormalities are identified.  There is stable mild grade 1 anterolisthesis of L4 on L5, reflecting facet disease. There is narrowing of the intervertebral disc space at L2-L3, with associated vacuum phenomenon.  IMPRESSION:  1.  Minimal relatively diffuse colonic wall thickening noted, with segmental areas of sparing, likely reflecting chronic changes from the patient's Crohn's disease.  No definite Crohn's exacerbation seen.  No free fluid appreciated. 2.  Mild degenerative change noted along the lumbar spine.  Original Report Authenticated By: JSanta Lighter M.D.    Hospital Course See H&P, Labs, Consult and Test reports for all details in brief, patient was admitted for  Diarrhea  Due to severe Crohn's colitis without Dysplasia- clinically improving, continue PO Mesalamin and prednisone as per GI instructed doses. Will follow with [facility] outpt for Remicade infusion. Much improved now.   Hypertension - well controlled on current medication regimen  stable.  Hypokalemia - resolved   ARF (acute renal failure) - resolved   Today   Subjective:   [place] today has no headache,no chest abdominal pain,no new weakness tingling or numbness, feels much better wants to go home today.    Objective:   Blood pressure 152/74, pulse 57, temperature 97.8 F (36.6 C), temperature source Oral, resp. rate 18, height 5' 6"  (1.676 m), weight 75.569 kg (166 lb 9.6 oz), SpO2 99.00%.  Intake/Output Summary (Last 24 hours) at 06/13/11 1309 Last data filed at 06/13/11 1200  Gross per 24 hour  Intake      0 ml  Output   1850 ml  Net  -1850 ml    Exam Awake Alert, Oriented *3, No new F.N deficits, Normal affect [place].AT,PERRAL Supple Neck,No JVD, No cervical lymphadenopathy appriciated.  Symmetrical Chest wall movement, Good air movement bilaterally, CTAB RRR,No Gallops,Rubs or new Murmurs, No Parasternal Heave +ve B.Sounds, Abd Soft, Non tender, No organomegaly appriciated, No rebound -guarding or rigidity. No Cyanosis, Clubbing or edema, No new Rash or bruise  Data Review    CBC w Diff: Lab Results  Component Value Date   WBC 13.4* 06/13/2011   HGB 11.1* 06/13/2011   HCT 33.1* 06/13/2011   PLT 225 06/13/2011   LYMPHOPCT 4* 06/01/2011   MONOPCT 6 06/01/2011   EOSPCT 0 06/01/2011   BASOPCT 0 06/01/2011   CMP: Lab Results  Component Value Date   NA 136 06/13/2011   K 4.0 06/13/2011   CL 104 06/13/2011   CO2 26 06/13/2011   BUN 24* 06/13/2011   CREATININE 0.52 06/13/2011   PROT 5.5* 06/06/2011   ALBUMIN 2.5* 06/06/2011   BILITOT 0.4 06/06/2011   ALKPHOS 45 06/06/2011   AST 10 06/06/2011   ALT 16 06/06/2011  . Discharge Instructions     Crohn's Disease Crohn's disease is a long-term (chronic) soreness and redness (inflammation) of the intestines (bowel). It can affect any portion of the digestive tract, from the mouth to the anus. It can also cause problems outside the digestive tract. Crohn's disease is closely related to a  disease called ulcerative colitis (together, these two diseases are called inflammatory bowel disease).  CAUSES  The cause of Crohn's disease is not known. One Link Snuffer is that, in an easily affected (susceptible) person, the immune system is triggered to attack the body's own digestive tissue. Crohn's disease runs in families. It seems to be more common in certain geographic areas and amongst certain races. There are no clear-cut dietary causes.  SYMPTOMS  Crohn's disease can cause many different symptoms since it can affect many different parts of the body. Symptoms include:  Fatigue.   Weight loss.   Chronic diarrhea, sometime bloody.   Abdominal pain and cramps.   Fever.   Ulcers or canker sores in the mouth or rectum.   Anemia (low red blood cells).   Arthritis, skin problems, and eye problems may occur.  Complications of Crohn's disease can include:  Series of holes (perforation) of the bowel.   Portions of the intestines sticking to each other (adhesions).   Obstruction of the bowel.   Fistula formation, typically in the rectal area but also in other areas. A fistula is an opening  between the bowels and the outside, or between the bowels and another organ.   A painful crack in the mucous membrane of the anus (rectal fissure).  DIAGNOSIS  Your caregiver may suspect Crohn's disease based on your symptoms and an exam. Blood tests may confirm that there is a problem. You may be asked to submit a stool specimen for examination. X-rays and CT scans may be necessary. Ultimately, the diagnosis is usually made after a procedure that uses a flexible tube that is inserted via your mouth or your anus. This is done under sedation and is called either an upper endoscopy or colonoscopy. With these tests, the specialist can take tiny tissue samples and remove them from the inside of the bowel (biopsy). Examination of this biopsy tissue under a microscope can reveal Crohn's disease as the cause  of your symptoms. Due to the many different forms that Crohn's disease can take, symptoms may be present for several years before a diagnosis is made. HOME CARE INSTRUCTIONS   There is no cure for Crohn's disease. The best treatment is frequent checkups with your caregiver.   Symptoms such as diarrhea can be controlled with medications. Avoid foods that have a laxative effect such as fresh fruit, vegetables and dairy products. During flare ups, you can rest your bowel by refraining from solid foods. Drink clear liquids frequently during the day (electrolyte or re-hydrating fluids are best. Your caregiver can help you with suggestions). Drink often to prevent loss of body fluids (dehydration). When diarrhea has cleared, eat small meals and more frequently. Avoid food additives and stimulants such as caffeine (coffee, tea, or chocolate). Enzyme supplements may help if you develop intolerance to a sugar in dairy products (lactose). Ask your caregiver or dietitian about specific dietary instructions.   Try to maintain a positive attitude. Learn relaxation techniques such as self hypnosis, mental imaging, and muscle relaxation.   If possible, avoid stresses which can aggravate your condition.   Exercise regularly.   Follow your diet.   Always get plenty of rest.  SEEK MEDICAL CARE IF:   Your symptoms fail to improve after a week or two of new treatment.   You experience continued weight loss.   You have ongoing crampy digestion or loose bowels.   You develop a new skin rash, skin sores, or eye problems.  SEEK IMMEDIATE MEDICAL CARE IF:   You have worsening of your symptoms or develop new symptoms.   You have a fever.   You develop bloody diarrhea.   You develop severe abdominal pain.  MAKE SURE YOU:   Understand these instructions.   Will watch your condition.   Will get help right away if you are not doing well or get worse.  Document Released: 04/18/2005 Document Revised:  03/21/2011 Document Reviewed: 03/17/2007 [place] [facility] Patient Information 2012 [place].   Follow with Primary MD No primary provider on file. in 7 days   Get CBC, CMP, checked 7 days by Primary MD and again as instructed by your Primary MD.   Get Medicines reviewed and adjusted.  Please request your Prim.MD to go over all Hospital Tests and Procedure/Radiological results at the follow up, please get all Hospital records sent to your Prim MD by signing hospital release before you go home.  Follow-up Information    Follow up with [name], MD. Make an appointment in 1 week. (At 10:00 am)    Contact information:   [address] [place] [address]  [place] [phone]       Follow up with [name]. Make an appointment in 1 week.   Contact information:   [place] [phone]          Activity: Fall precautions use walker/cane & assistance as needed  Do not drive if your were admitted for syncope or siezures until you have seen by Primary MD or a Neurologist and advised to drive.  Do not drive when taking Pain medications.   Wear Seat belts while driving.  Diet: Heart Healthy, Aspiration precautions.  For Heart failure patients - Check your Weight same time everyday, if you gain over 2 pounds, or you develop in leg swelling, experience more shortness of breath or chest pain, call your Primary MD immediately. Follow Cardiac Low Salt Diet and 1.8 lit/day fluid restriction.  Disposition Home  If you experience worsening of your admission symptoms, develop shortness of breath, life threatening emergency, suicidal or homicidal thoughts you must seek medical attention immediately by calling 911 or calling your MD immediately  if symptoms less severe.  Do not take more than prescribed Pain, Sleep and Anxiety Medications  Special Instructions: If you have smoked or chewed Tobacco  in the last 2  yrs please stop smoking, stop any regular Alcohol  and or any Recreational drug use.  You Must read complete instructions/literature along with all the possible adverse reactions/side effects for all the Medicines you take and that have been prescribed to you. Take any new Medicines after you have completely understood and accpet all the possible adverse reactions/side effects.    Follow-up Information    Follow up with [name], MD. Make an appointment in 1 week. (At 10:00 am)    Contact information:   [address] [facility] [place]       Follow up with [name]. Make an appointment in 1 week.   Contact information:   [place] [phone]          Discharge Medications   Medication List  As of 06/13/2011  1:09 PM   CHANGE how you take these medications         predniSONE 10 MG tablet   Commonly known as: DELTASONE   Take 37m PO daily for 1 week, then 30 mg Po daily till you see your stomach doctor.   What changed: - dose - route (how to take the med) - how often to take the med - doctor's instructions         CONTINUE taking these medications         b complex vitamins capsule      cetirizine 10 MG tablet   Commonly known as: ZYRTEC      cholestyramine 4 G packet   Commonly known as: QUESTRAN      dicyclomine 20 MG tablet   Commonly known as: BENTYL      losartan-hydrochlorothiazide 100-12.5 MG per tablet   Commonly known as: HYZAAR      mesalamine 1.2 G EC tablet   Commonly known as: LIALDA      multivitamins ther. w/minerals Tabs      pantoprazole 40 MG tablet   Commonly known as: PROTONIX      traMADol 50 MG tablet   Commonly known as: ULTRAM         STOP taking these medications  metroNIDAZOLE 500 MG tablet      ondansetron 4 MG tablet          Where to get your medications    These are the prescriptions that you need to  pick up.   You may get these medications from any pharmacy.         predniSONE 10 MG tablet            Total Time in preparing paper work, data evaluation and todays exam - 35 minutes  Signed: Thurnell Lose 06/13/2011 1:09 PM  The patient was admitted to the Hospitalist Service.

## 2011-06-15 NOTE — Progress Notes (Signed)
PT WAS DC'D ON 11/21 WITH HH PT Chauncy Lean 06/15/2011 [phone] OR [phone]

## 2011-06-19 ENCOUNTER — Other Ambulatory Visit: Payer: Self-pay

## 2011-06-19 ENCOUNTER — Encounter (HOSPITAL_COMMUNITY): Payer: Self-pay | Admitting: Gastroenterology

## 2011-06-19 DIAGNOSIS — K5090 Crohn's disease, unspecified, without complications: Secondary | ICD-10-CM

## 2011-06-20 ENCOUNTER — Telehealth: Payer: Self-pay

## 2011-06-20 NOTE — Telephone Encounter (Signed)
Patient is scheduled for Remicade infusion 2 weeks and 6 weeks for 06/25/11 9:30 and 07/23/11 at 9:00

## 2011-06-20 NOTE — Telephone Encounter (Signed)
Patient advised of Remicade infusion dates and times

## 2011-06-22 ENCOUNTER — Telehealth: Payer: Self-pay | Admitting: Internal Medicine

## 2011-06-22 ENCOUNTER — Other Ambulatory Visit (HOSPITAL_COMMUNITY): Payer: Self-pay | Admitting: *Deleted

## 2011-06-22 NOTE — Telephone Encounter (Signed)
Received 3 pages from [facility] on 11.30.12 forwarded to Dr. Carlean Purl for review. sj

## 2011-06-25 ENCOUNTER — Encounter (HOSPITAL_COMMUNITY)
Admission: RE | Admit: 2011-06-25 | Discharge: 2011-06-25 | Disposition: A | Discharge status: Home / Self Care | Payer: Medicare Other | Source: Ambulatory Visit | Attending: Internal Medicine | Admitting: Internal Medicine

## 2011-06-25 DIAGNOSIS — K5090 Crohn's disease, unspecified, without complications: Secondary | ICD-10-CM | POA: Insufficient documentation

## 2011-06-25 MED ORDER — ACETAMINOPHEN ER 650 MG PO TBCR: 650.0000 mg | EXTENDED_RELEASE_TABLET | ORAL | Status: DC

## 2011-06-25 MED ORDER — SODIUM CHLORIDE 0.9 % IV SOLN
5.0000 mg/kg | INTRAVENOUS | Status: DC
  Administered 2011-06-25: 400 mg via INTRAVENOUS
  Filled 2011-06-25: qty 40

## 2011-06-25 MED ORDER — SODIUM CHLORIDE 0.9 % IV SOLN
INTRAVENOUS | Status: DC
  Administered 2011-06-25: 10:00:00 via INTRAVENOUS

## 2011-06-25 MED ORDER — ACETAMINOPHEN 325 MG PO TABS
650.0000 mg | ORAL_TABLET | Freq: Once | ORAL | Status: AC
  Administered 2011-06-25: 650 mg via ORAL

## 2011-06-25 MED ORDER — DIPHENHYDRAMINE HCL 25 MG PO TABS
25.0000 mg | ORAL_TABLET | ORAL | Status: DC
  Administered 2011-06-25: 25 mg via ORAL

## 2011-06-25 NOTE — Progress Notes (Signed)
Consent signed question answered per MCA.

## 2011-07-09 ENCOUNTER — Encounter: Payer: Self-pay | Admitting: Internal Medicine

## 2011-07-09 ENCOUNTER — Ambulatory Visit (INDEPENDENT_AMBULATORY_CARE_PROVIDER_SITE_OTHER): Payer: Medicare Other | Admitting: Internal Medicine

## 2011-07-09 VITALS — BP 106/62 | HR 78 | Ht 66.0 in | Wt 163.0 lb

## 2011-07-09 DIAGNOSIS — D899 Disorder involving the immune mechanism, unspecified: Secondary | ICD-10-CM

## 2011-07-09 DIAGNOSIS — K5010 Crohn's disease of large intestine without complications: Secondary | ICD-10-CM

## 2011-07-09 DIAGNOSIS — Z79899 Other long term (current) drug therapy: Secondary | ICD-10-CM

## 2011-07-09 MED ORDER — PREDNISONE 10 MG PO TABS: ORAL_TABLET | ORAL | Status: DC

## 2011-07-09 NOTE — Assessment & Plan Note (Addendum)
Significantly improved on Remicade 34m/kg after 0 and 2 week doses.  Plan on continuing that every 8 weeks, tapering steroids, stopping mesalamine.  Note that may have more than left-sided colitis based upon endoscopic and CT findings Return visit in 3 months

## 2011-07-09 NOTE — Patient Instructions (Addendum)
   Prednisone has been changed, take as prescribed on your medication list. Discontinue the Lialda. We will obtain your labs from Dr. Allean Found for review. Return to see Dr. Carlean Purl in 3 months.

## 2011-07-09 NOTE — Progress Notes (Signed)
Subjective:    Patient ID: [place], female    DOB: Jul 21, 1941, 70 y.o.   MRN: [id number]  HPI 70 yo ww with left-sided Crohn's colitis. She was hospitalized twice in 2012, last in November. Had been treated with mesalamine and multiple rounds of steroids. After 2 doses of 5 mg/kg Remicade she is 85% better overall. No diarrhea, no bleeding. Less abdominal cramps. 1-2 stools a day.  Has had influenza and pneumovax and Tdap Allergies  Allergen Reactions  . Adhesive (Tape) Rash  . Sulfa Antibiotics Itching and Rash   Outpatient Prescriptions Prior to Visit  Medication Sig Dispense Refill  . b complex vitamins capsule Take 1 capsule by mouth daily.        Marland Kitchen dicyclomine (BENTYL) 20 MG tablet Take 20 mg by mouth 3 (three) times daily as needed. For stomach pain      . losartan-hydrochlorothiazide (HYZAAR) 100-12.5 MG per tablet Take 1 tablet by mouth daily.        . Multiple Vitamins-Minerals (MULTIVITAMINS THER. W/MINERALS) TABS Take 1 tablet by mouth daily.        . Omega-3 Fatty Acids (FISH OIL) 1000 MG CAPS Take 1,000 mg by mouth daily.        . pantoprazole (PROTONIX) 40 MG tablet Take 40 mg by mouth daily.        . traMADol (ULTRAM) 50 MG tablet Take 50 mg by mouth 2 (two) times daily as needed. For pain. Maximum dose= 8 tablets per day      . mesalamine (LIALDA) 1.2 G EC tablet Take 1,200 mg by mouth daily with breakfast.       . predniSONE (DELTASONE) 10 MG tablet Take 74m PO daily for 1 week, then 30 mg Po daily till you see your stomach doctor.  60 tablet  2  . cetirizine (ZYRTEC) 10 MG tablet Take 10 mg by mouth daily.        . cholestyramine (QUESTRAN) 4 G packet Take 2 packets by mouth 2 (two) times daily as needed. 2 hours before or after any other medication as needed for diarrhea       Past Medical History  Diagnosis Date  . Crohn's colitis   . Hypertension   . Colon polyps     adenomatous polyp June 2012  . Complication of anesthesia     slow to wake  . Shortness  of breath   . Blood transfusion     1966  . Headache     hx migraines  . Arthritis     shoulder   Past Surgical History  Procedure Date  . Cholecystectomy   . Abdominal hysterectomy   . Tonsillectomy   . Joint replacement   . Foot ganglion excision   . Appendectomy   . Flexible sigmoidoscopy 06/08/2011    severe Crohn's colitis to descending colon at least  . Colonoscopy 12/2010    sigmoid colitis, adenomatous polyp (Dr. BMelina Copa   History   Social History  . Marital Status: Married    Spouse Name: N/A    Number of Children: N/A  . Years of Education: N/A   Social History Main Topics  . Smoking status: Former SResearch scientist (life sciences) . Smokeless tobacco: Never Used  . Alcohol Use: No  . Drug Use: No  . Sexually Active: No   Other Topics Concern  . None   Social History Narrative  . None   Family History  Problem Relation Age of Onset  . Leukemia Mother   .  Anesthesia problems Neg Hx   . Hypotension Neg Hx   . Malignant hyperthermia Neg Hx   . Pseudochol deficiency Neg Hx   . Heart disease Mother   . Heart disease Father        Review of Systems Generally weak, especially legs. Is doing PT at home - getting better. She had hip flexor weakness. Some joint pains now.  Night after second Remicade she developed itching in forearms, they were red and she took Benadryl. She will have this from time to time, even before Remicade.    Objective:   Physical Exam General:  NAD Eyes:   anicteric Lungs:  clear Heart:  S1S2 no rubs, murmurs or gallops Abdomen:  soft and mildly tender RLQ, BS+ Ext:   no edema    Data Reviewed: hospital notes, labs 06/20/11 [facility] - glucose 215, BMET o/w normal. CBC normal except WBC 14. LFT's and B12 normal. Hgb A1C 5.8 %          Assessment & Plan:

## 2011-07-18 ENCOUNTER — Other Ambulatory Visit: Payer: Self-pay

## 2011-07-18 ENCOUNTER — Other Ambulatory Visit (HOSPITAL_COMMUNITY): Payer: Self-pay | Admitting: *Deleted

## 2011-07-18 DIAGNOSIS — K5090 Crohn's disease, unspecified, without complications: Secondary | ICD-10-CM

## 2011-07-18 MED ORDER — DIPHENHYDRAMINE HCL 25 MG PO TABS: 25.0000 mg | ORAL_TABLET | ORAL | Status: DC

## 2011-07-18 MED ORDER — ACETAMINOPHEN 325 MG PO TABS: 650.0000 mg | ORAL_TABLET | Freq: Once | ORAL | Status: DC

## 2011-07-18 MED ORDER — SODIUM CHLORIDE 0.9 % IV SOLN: 5.0000 mg/kg | INTRAVENOUS | Status: DC

## 2011-07-18 MED ORDER — SODIUM CHLORIDE 0.9 % IV SOLN: INTRAVENOUS | Status: DC

## 2011-07-23 ENCOUNTER — Encounter (HOSPITAL_COMMUNITY): Payer: Self-pay

## 2011-07-23 ENCOUNTER — Encounter (HOSPITAL_COMMUNITY)
Admission: RE | Admit: 2011-07-23 | Discharge: 2011-07-23 | Disposition: A | Discharge status: Home / Self Care | Payer: Medicare Other | Source: Ambulatory Visit | Attending: Internal Medicine | Admitting: Internal Medicine

## 2011-07-23 DIAGNOSIS — K5090 Crohn's disease, unspecified, without complications: Secondary | ICD-10-CM

## 2011-07-23 MED ORDER — ACETAMINOPHEN 325 MG PO TABS
650.0000 mg | ORAL_TABLET | Freq: Once | ORAL | Status: AC
  Administered 2011-07-23: 650 mg via ORAL

## 2011-07-23 MED ORDER — ACETAMINOPHEN ER 650 MG PO TBCR: 650.0000 mg | EXTENDED_RELEASE_TABLET | ORAL | Status: DC

## 2011-07-23 MED ORDER — DIPHENHYDRAMINE HCL 25 MG PO CAPS
50.0000 mg | ORAL_CAPSULE | ORAL | Status: DC
  Administered 2011-07-23: 50 mg via ORAL

## 2011-07-23 MED ORDER — SODIUM CHLORIDE 0.9 % IV SOLN
5.0000 mg/kg | INTRAVENOUS | Status: DC
  Administered 2011-07-23: 400 mg via INTRAVENOUS
  Filled 2011-07-23: qty 40

## 2011-07-23 MED ORDER — SODIUM CHLORIDE 0.9 % IV SOLN
INTRAVENOUS | Status: DC
  Administered 2011-07-23: 10:00:00 via INTRAVENOUS

## 2011-08-23 ENCOUNTER — Telehealth: Payer: Self-pay | Admitting: Internal Medicine

## 2011-08-23 NOTE — Telephone Encounter (Signed)
Patient completed her prednisone last week.  She started having bright red rectal bleeding, lower abdominal cramping, and diarrhea.  She reports that she passes blood "even when I pass gas".  Her next remicade is due 2/25.  Dr. Carlean Purl Please advise

## 2011-08-23 NOTE — Telephone Encounter (Signed)
Restart prednisone 10 mg tabs - 4 each day for 7 days, then 3 each day for 7 days then 2 each day for 14 days then 1 each day until told to change #100 1 refill  If not getting better in 1 week - call back  REV needs to be set up for after next Remicade

## 2011-08-23 NOTE — Telephone Encounter (Signed)
Patient has a rx at home for prednisone.  She verbalized understanding of how to take her prednisone and to call back for no improvement .  She is scheduled for REV 09/24/11

## 2011-09-10 ENCOUNTER — Telehealth: Payer: Self-pay | Admitting: Internal Medicine

## 2011-09-10 NOTE — Telephone Encounter (Signed)
Patient is c/o abdominal pain, rectal bleeding and watery diarrhea.  She states currently on 20 mg of prednisone, she is to decrease to 10 mg tomorrow.  She is offered an appt for today with [name] RNP, but she feels she can wait until tomorrow and see Dr Carlean Purl.  She will come in and be seen tomorrow at 2:15 with Dr Carlean Purl

## 2011-09-11 ENCOUNTER — Inpatient Hospital Stay (HOSPITAL_COMMUNITY)
Admission: AD | Admit: 2011-09-11 | Discharge: 2011-09-15 | DRG: 387 | Disposition: A | Discharge status: Home / Self Care | Payer: Medicare Other | Source: Ambulatory Visit | Attending: Internal Medicine | Admitting: Internal Medicine

## 2011-09-11 ENCOUNTER — Ambulatory Visit (INDEPENDENT_AMBULATORY_CARE_PROVIDER_SITE_OTHER): Payer: Medicare Other | Admitting: Internal Medicine

## 2011-09-11 ENCOUNTER — Inpatient Hospital Stay (HOSPITAL_COMMUNITY): Payer: Medicare Other

## 2011-09-11 ENCOUNTER — Encounter (HOSPITAL_COMMUNITY): Payer: Self-pay

## 2011-09-11 ENCOUNTER — Encounter: Payer: Self-pay | Admitting: Internal Medicine

## 2011-09-11 VITALS — BP 80/50 | HR 62 | Ht 66.0 in | Wt 165.6 lb

## 2011-09-11 DIAGNOSIS — I10 Essential (primary) hypertension: Secondary | ICD-10-CM | POA: Diagnosis present

## 2011-09-11 DIAGNOSIS — G43909 Migraine, unspecified, not intractable, without status migrainosus: Secondary | ICD-10-CM | POA: Diagnosis present

## 2011-09-11 DIAGNOSIS — R197 Diarrhea, unspecified: Secondary | ICD-10-CM

## 2011-09-11 DIAGNOSIS — K5010 Crohn's disease of large intestine without complications: Secondary | ICD-10-CM

## 2011-09-11 DIAGNOSIS — Z87891 Personal history of nicotine dependence: Secondary | ICD-10-CM

## 2011-09-11 DIAGNOSIS — Z882 Allergy status to sulfonamides status: Secondary | ICD-10-CM

## 2011-09-11 DIAGNOSIS — IMO0002 Reserved for concepts with insufficient information to code with codable children: Secondary | ICD-10-CM

## 2011-09-11 DIAGNOSIS — E860 Dehydration: Secondary | ICD-10-CM

## 2011-09-11 DIAGNOSIS — Z860100 Personal history of colon polyps, unspecified: Secondary | ICD-10-CM

## 2011-09-11 DIAGNOSIS — Z6827 Body mass index (BMI) 27.0-27.9, adult: Secondary | ICD-10-CM

## 2011-09-11 DIAGNOSIS — M19019 Primary osteoarthritis, unspecified shoulder: Secondary | ICD-10-CM | POA: Diagnosis present

## 2011-09-11 DIAGNOSIS — Z79899 Other long term (current) drug therapy: Secondary | ICD-10-CM

## 2011-09-11 LAB — COMPREHENSIVE METABOLIC PANEL
ALT: 8 U/L (ref 0–35)
AST: 9 U/L (ref 0–37)
GFR calc non Af Amer: 34 mL/min — ABNORMAL LOW (ref >90)
Glucose, Bld: 134 mg/dL — ABNORMAL HIGH (ref 70–99)
Potassium: 3.3 mEq/L — ABNORMAL LOW (ref 3.5–5.1)
Sodium: 131 mEq/L — ABNORMAL LOW (ref 135–145)
Total Bilirubin: 0.4 mg/dL (ref 0.3–1.2)
Total Protein: 6.4 g/dL (ref 6.0–8.3)

## 2011-09-11 LAB — DIFFERENTIAL
Eosinophils Relative: 0 % (ref 0–5)
Lymphocytes Relative: 7 % — ABNORMAL LOW (ref 12–46)
Monocytes Relative: 12 % (ref 3–12)
Neutrophils Relative %: 81 % — ABNORMAL HIGH (ref 43–77)

## 2011-09-11 LAB — CBC
Platelets: 442 10*3/uL — ABNORMAL HIGH (ref 150–400)
RBC: 4.41 MIL/uL (ref 3.87–5.11)
WBC: 10.9 10*3/uL — ABNORMAL HIGH (ref 4.0–10.5)

## 2011-09-11 MED ORDER — SODIUM CHLORIDE 0.9 % IV BOLUS (SEPSIS)
1000.0000 mL | Freq: Once | INTRAVENOUS | Status: AC
  Administered 2011-09-11: 1000 mL via INTRAVENOUS

## 2011-09-11 MED ORDER — HYDROMORPHONE HCL PF 1 MG/ML IJ SOLN
1.0000 mg | INTRAMUSCULAR | Status: DC | PRN
  Administered 2011-09-11 – 2011-09-13 (×4): 1 mg via INTRAVENOUS
  Filled 2011-09-11 (×4): qty 1

## 2011-09-11 MED ORDER — METHYLPREDNISOLONE SODIUM SUCC 40 MG IJ SOLR
40.0000 mg | INTRAMUSCULAR | Status: DC
  Administered 2011-09-11 – 2011-09-13 (×3): 40 mg via INTRAVENOUS
  Filled 2011-09-11 (×4): qty 1

## 2011-09-11 MED ORDER — KCL IN DEXTROSE-NACL 20-5-0.9 MEQ/L-%-% IV SOLN
INTRAVENOUS | Status: DC
  Administered 2011-09-11 – 2011-09-14 (×6): via INTRAVENOUS
  Filled 2011-09-11 (×10): qty 1000

## 2011-09-11 MED ORDER — ONDANSETRON HCL 4 MG/2ML IJ SOLN
4.0000 mg | Freq: Four times a day (QID) | INTRAMUSCULAR | Status: DC | PRN
  Administered 2011-09-13: 4 mg via INTRAVENOUS
  Filled 2011-09-11: qty 2

## 2011-09-11 MED ORDER — ONDANSETRON HCL 4 MG PO TABS: 4.0000 mg | ORAL_TABLET | Freq: Four times a day (QID) | ORAL | Status: DC | PRN

## 2011-09-11 MED ORDER — PANTOPRAZOLE SODIUM 40 MG PO TBEC
40.0000 mg | DELAYED_RELEASE_TABLET | ORAL | Status: DC
  Administered 2011-09-12 – 2011-09-15 (×4): 40 mg via ORAL
  Filled 2011-09-11 (×6): qty 1

## 2011-09-11 NOTE — H&P (Signed)
BP Pulse Ht Wt BMI    80/50  62  5' 6"  (1.676 m)  165 lb 9.6 oz (75.116 kg)  26.73 kg/m2         Subjective:      Patient ID: [place], female    DOB: 12-Jan-1941, 71 y.o.   MRN: [id number]  Cc: diarrhea, light-headed, abdominal pain in Crohn's disease  HPI This 71 year old white woman is here with her son. She has Crohn's colitis, thought to be left-sided but maybe more diffuse. She was hospitalized in late 2012 and started on Remicade for steroid refractory disease. She was showing improvement but then she came down off of prednisone while being induced with Remicade she developed recurrent symptoms so prednisone was restarted at the end of January. She took 40 mg daily for a week and then 30 mg daily and now is down to 20 mg daily. She is complaining of very crampy and intense abdominal pain prior to defecation with loose urgent defecation associated with incontinence. She had some bleeding when she initially called although now the stools are yellow and loose. He has been hot and cold flashes but has not had an obvious fever that we know of. She took some narcotics she had left over from a prior orthopedic issue which helped to get through the night but she is not sleeping well due to the pain and the diarrhea. She says this is similar to her other flares that she has had. She vomited once last night that has not been a predominant feature. She is due for her next Remicade in about 6 days. This would be the first dose of every 8 week regimen.  Allergies   Allergen  Reactions   .  Adhesive (Tape)  Rash   .  Sulfa Antibiotics  Itching and Rash    Outpatient Prescriptions Prior to Visit    .  b complex vitamins capsule  Take 1 capsule by mouth daily.           .  cetirizine (ZYRTEC) 10 MG tablet  Take 10 mg by mouth daily.           Marland Kitchen  dicyclomine (BENTYL) 20 MG tablet  Take 20 mg by mouth 3 (three) times daily as needed. For stomach pain         .   losartan-hydrochlorothiazide (HYZAAR) 100-12.5 MG per tablet  Take 1 tablet by mouth daily.           .  Multiple Vitamins-Minerals (MULTIVITAMINS THER. W/MINERALS) TABS  Take 1 tablet by mouth daily.           .  Omega-3 Fatty Acids (FISH OIL) 1000 MG CAPS  Take 1,000 mg by mouth daily.           .  pantoprazole (PROTONIX) 40 MG tablet  Take 40 mg by mouth daily.           .  traMADol (ULTRAM) 50 MG tablet  Take 50 mg by mouth 2 (two) times daily as needed. For pain. Maximum dose= 8 tablets per day         .  predniSONE (DELTASONE) 10 MG tablet  2 tablets daily   60 tablet   2       Past Medical History    .  Crohn's colitis     .  Hypertension     .  Colon polyps         adenomatous  polyp June 2012   .  Complication of anesthesia         slow to wake   .  Shortness of breath     .  Blood transfusion         1966   .  Headache         hx migraines   .  Arthritis         shoulder    Past Surgical History   .  Cholecystectomy     .  Abdominal hysterectomy     .  Tonsillectomy     .  Shoulder surgery         right   .  Foot ganglion excision     .  Appendectomy     .  Flexible sigmoidoscopy  06/08/2011       severe Crohn's colitis to descending colon at least   .  Colonoscopy  12/2010       sigmoid colitis, adenomatous polyp (Dr. Melina Copa)    History       Social History   .  Marital Status:  Widowed       Spouse Name:  N/A       Number of Children:  3   .  Years of Education:  N/A       Occupational History   .  retired          Social History Main Topics   .  Smoking status:  Former Research scientist (life sciences)   .  Smokeless tobacco:  Never Used   .  Alcohol Use:  Yes         rare wine   .  Drug Use:  No   .  Sexually Active:  No           Family History   Problem  Relation  Age of Onset   .  Leukemia  Mother     .  Anesthesia problems  Neg Hx     .  Hypotension  Neg Hx     .  Malignant hyperthermia  Neg Hx     .  Pseudochol deficiency  Neg Hx     .  Heart disease   Mother     .  Heart disease  Father     .  Colon cancer  Neg Hx     .  Diabetes  Neg Hx     .  Breast cancer  Paternal Aunt         1/2 aunt   .  Throat cancer  Paternal Aunt         1/2 aunt   .  Ovarian cancer  Paternal Aunt         1/2 aunt   .  Parkinsonism  Brother     .  Heart disease  Brother     .  Leukemia  Maternal Grandfather            Review of Systems She is weak, having difficulty keeping up with her fluids, says her mouth is dry, she noticed a small fluid-filled density lesion on the left patella for the last week or so. She is anxious over her illness and is tired and feeling ill. Alert he is systems appears negative at this time.   Objective:    Filed Vitals:   09/11/11 1500  BP: 78/50  Pulse: 115  Temp: 97.5 F (36.4 C)  Resp: 18    Physical Exam  General:         Well-developed, well-nourished and in mild distress Eyes:               anicteric. ENT:               Mouth and posterior pharynx free of lesions. The process somewhat dry Neck:              supple w/o thyromegaly or mass.  Lungs:            Clear to auscultation bilaterally. Heart:             S1S2, no rubs, murmurs, gallops. Abdomen:        soft,  no hepatosplenomegaly, hernia, or mass and BS+. Bowel sounds are somewhat increased at times. She is tender in the epigastrium and right lower quadrant areas. There is some increased tympany. No obvious peritoneal signs. Lymph:            no cervical or supraclavicular adenopathy. Extremities:   no edema, there is a small fluid density change in the subcutaneous tissues on the patella of the left knee. It is nontender. Skin                 no rash. Neuro:            A&O x 3.  Psych:             appropriate mood and  Affect, but anxious     Assessment & Plan:    #1 Crohn's colitis #2 increasing abdominal pain and diarrhea, presumably secondary to #1 there could be some superimposed infection #3 suspected dehydration   Patient is not  responding to steroids and Remicade. It sounds like she is having refractory disease with her Crohn's colitis. A superimposed infection is also possible. She is not making it on outpatient therapy and I am concerned about the tenderness of her abdominal exam at this time. Plan to admit her to the hospital for IV hydration and diagnostic workup with x-rays, she may need a full colonoscopy released a repeat flexible sigmoidoscopy. Higher doses of steroids will be administered. Solu-Medrol 40 mg daily I think. Will limit her diet to liquids just because of a Bentyl she can tolerate. She may end up actually having to be considered for surgery depending upon the overall situation she is not responding to aggressive medical therapy it seems.

## 2011-09-11 NOTE — Patient Instructions (Signed)
Called and spoke with Robin for bed placement. Please report to the [facility] admitting Department to be admitted on [address] for regular Med Surg bed.

## 2011-09-11 NOTE — Progress Notes (Signed)
Subjective:    Patient ID: [place], female    DOB: October 02, 1940, 71 y.o.   MRN: [id number]  HPI This 71 year old white woman is here with her son. She has Crohn's colitis, thought to be left-sided but maybe more diffuse. She was hospitalized in late 2012 and started on Remicade for steroid refractory disease. She was showing improvement but then she came down off of prednisone while being induced with Remicade she developed recurrent symptoms so prednisone was restarted at the end of January. She took 40 mg daily for a week and then 30 mg daily and now is down to 20 mg daily. She is complaining of very crampy and intense abdominal pain prior to defecation with loose urgent defecation associated with incontinence. She had some bleeding when she initially called although now the stools are yellow and loose. He has been hot and cold flashes but has not had an obvious fever that we know of. She took some narcotics she had left over from a prior orthopedic issue which helped to get through the night but she is not sleeping well due to the pain and the diarrhea. She says this is similar to her other flares that she has had. She vomited once last night that has not been a predominant feature. She is due for her next Remicade in about 6 days. This would be the first dose of every 8 week regimen. Allergies  Allergen Reactions  . Adhesive (Tape) Rash  . Sulfa Antibiotics Itching and Rash   Outpatient Prescriptions Prior to Visit  Medication Sig Dispense Refill  . b complex vitamins capsule Take 1 capsule by mouth daily.        . cetirizine (ZYRTEC) 10 MG tablet Take 10 mg by mouth daily.        Marland Kitchen dicyclomine (BENTYL) 20 MG tablet Take 20 mg by mouth 3 (three) times daily as needed. For stomach pain      . losartan-hydrochlorothiazide (HYZAAR) 100-12.5 MG per tablet Take 1 tablet by mouth daily.        . Multiple Vitamins-Minerals (MULTIVITAMINS THER. W/MINERALS) TABS Take 1 tablet by mouth daily.          . Omega-3 Fatty Acids (FISH OIL) 1000 MG CAPS Take 1,000 mg by mouth daily.        . pantoprazole (PROTONIX) 40 MG tablet Take 40 mg by mouth daily.        . traMADol (ULTRAM) 50 MG tablet Take 50 mg by mouth 2 (two) times daily as needed. For pain. Maximum dose= 8 tablets per day      . predniSONE (DELTASONE) 10 MG tablet 2 tablets daily  60 tablet  2   Past Medical History  Diagnosis Date  . Crohn's colitis   . Hypertension   . Colon polyps     adenomatous polyp June 2012  . Complication of anesthesia     slow to wake  . Shortness of breath   . Blood transfusion     1966  . Headache     hx migraines  . Arthritis     shoulder   Past Surgical History  Procedure Date  . Cholecystectomy   . Abdominal hysterectomy   . Tonsillectomy   . Shoulder surgery     right  . Foot ganglion excision   . Appendectomy   . Flexible sigmoidoscopy 06/08/2011    severe Crohn's colitis to descending colon at least  . Colonoscopy 12/2010    sigmoid colitis, adenomatous  polyp (Dr. Melina Copa)   History   Social History  . Marital Status: Widowed    Spouse Name: N/A    Number of Children: 3  . Years of Education: N/A   Occupational History  . retired     Social History Main Topics  . Smoking status: Former Research scientist (life sciences)  . Smokeless tobacco: Never Used  . Alcohol Use: Yes     rare wine  . Drug Use: No  . Sexually Active: No     Family History  Problem Relation Age of Onset  . Leukemia Mother   . Anesthesia problems Neg Hx   . Hypotension Neg Hx   . Malignant hyperthermia Neg Hx   . Pseudochol deficiency Neg Hx   . Heart disease Mother   . Heart disease Father   . Colon cancer Neg Hx   . Diabetes Neg Hx   . Breast cancer Paternal Aunt     1/2 aunt  . Throat cancer Paternal Aunt     1/2 aunt  . Ovarian cancer Paternal Aunt     1/2 aunt  . Parkinsonism Brother   . Heart disease Brother   . Leukemia Maternal Grandfather        Review of Systems She is weak, having difficulty  keeping up with her fluids, says her mouth is dry, she noticed a small fluid-filled density lesion on the left patella for the last week or so. She is anxious over her illness and is tired and feeling ill. Alert he is systems appears negative at this time.    Objective:   Physical Exam General:  Well-developed, well-nourished and in mild distress Eyes:  anicteric. ENT:   Mouth and posterior pharynx free of lesions. The process somewhat dry Neck:   supple w/o thyromegaly or mass.  Lungs: Clear to auscultation bilaterally. Heart:  S1S2, no rubs, murmurs, gallops. Abdomen:  soft,  no hepatosplenomegaly, hernia, or mass and BS+. Bowel sounds are somewhat increased at times. She is tender in the epigastrium and right lower quadrant areas. There is some increased tympany. No obvious peritoneal signs. Lymph:  no cervical or supraclavicular adenopathy. Extremities:   no edema, there is a small fluid density change in the subcutaneous tissues on the patella of the left knee. It is nontender. Skin   no rash. Neuro:  A&O x 3.  Psych:  appropriate mood and  Affect, but anxious       Assessment & Plan:  #1 Crohn's colitis #2 increasing abdominal pain and diarrhea, presumably secondary to #1 there could be some superimposed infection #3  dehydration  Patient is not responding to steroids and Remicade. It sounds like she is having refractory disease with her Crohn's colitis. A superimposed infection with C. Diff is also possible. She is not making it on outpatient therapy and I am concerned about the tenderness of her abdominal exam at this time. She is dehydrated with low normal BP and tachycardia.  Plan to admit her to the hospital for IV hydration and diagnostic workup with x-rays, she may need a full colonoscopy released a repeat flexible sigmoidoscopy. Higher doses of steroids will be administered. Solu-Medrol 40 mg daily I think. Will limit her diet to liquids just because of a Bentyl she can  tolerate. She may end up actually having to be considered for surgery depending upon the overall situation she is not responding to aggressive medical therapy it seems.  Holding losartan/HCTZ given hypotension/dehydration.

## 2011-09-12 LAB — CLOSTRIDIUM DIFFICILE BY PCR: Toxigenic C. Difficile by PCR: NEGATIVE

## 2011-09-12 MED ORDER — ZOLPIDEM TARTRATE 5 MG PO TABS
ORAL_TABLET | ORAL | Status: AC
  Administered 2011-09-12: 5 mg via ORAL
  Filled 2011-09-12: qty 1

## 2011-09-12 MED ORDER — ZOLPIDEM TARTRATE 5 MG PO TABS
5.0000 mg | ORAL_TABLET | Freq: Every evening | ORAL | Status: DC | PRN
  Administered 2011-09-12 – 2011-09-14 (×4): 5 mg via ORAL
  Filled 2011-09-12 (×3): qty 1

## 2011-09-12 NOTE — Progress Notes (Signed)
Already beginning to improve.  Continue current regimen

## 2011-09-12 NOTE — Progress Notes (Signed)
[facility] Progress Note  SUBJECTIVE: feels better today, feels stronger, not woozy.  OBJECTIVE:  Vital signs in last 24 hours: Temp:  [97.5 F (36.4 C)-98.5 F (36.9 C)] 98.3 F (36.8 C) (02/20 0640) Pulse Rate:  [62-115] 71  (02/20 0640) Resp:  [18] 18  (02/20 0640) BP: (78-124)/(50-76) 106/66 mmHg (02/20 0640) SpO2:  [95 %-97 %] 95 % (02/20 0640) Weight:  [165 lb 5.5 oz (75 kg)-166 lb 10.7 oz (75.6 kg)] 166 lb 10.7 oz (75.6 kg) (02/20 0640) Last BM Date: 09/12/11 General:    Pleasant white female in NAD Heart:  Regular rate and rhythm Lungs: Respirations even and unlabored, lungs CTA bilaterally Abdomen:  Soft, nontender, mild diffuse tenderness. Normal bowel sounds. Extremities:  Without edema. Neurologic:  Alert and oriented,  grossly normal neurologically. Psych:  Cooperative. Normal mood and affect.    Lab Results:  [facility] 09/11/11 1923  WBC 10.9*  HGB 13.3  HCT 39.6  PLT 442*   BMET  Basename 09/11/11 1923  NA 131*  K 3.3*  CL 96  CO2 21  GLUCOSE 134*  BUN 21  CREATININE 1.50*  CALCIUM 9.0   LFT  Basename 09/11/11 1923  PROT 6.4  ALBUMIN 2.9*  AST 9  ALT 8  ALKPHOS 41  BILITOT 0.4  BILIDIR --  IBILI --    Studies/Results: Acute Abdominal Series  09/11/2011  *RADIOLOGY REPORT*  Clinical Data: 71 year old female with abdominal pain.  Crohn disease.  ACUTE ABDOMEN SERIES (ABDOMEN 2 VIEW & CHEST 1 VIEW)  Comparison: CT abdomen and pelvis 06/07/2011.  Findings: Normal lung volumes. Normal cardiac size and mediastinal contours.  Visualized tracheal air column is within normal limits. The lungs are clear.  No pneumothorax or pneumoperitoneum.  Right upper quadrant surgical clips.  No dilated bowel loops. Decompressed colon versus distal colonic wall thickening.  No abnormal small bowel loops identified. No acute osseous abnormality identified.  IMPRESSION: 1. Nonobstructed bowel gas pattern, no free air. 2.  Negative chest. 3.   Questionable inflammatory distal colonic wall thickening.  Original Report Authenticated By: [name], M.D.   ASSESSMENT / PLAN:  1. Crohn's colitis. Admitting with abdominal pain, diarrhea with blood and dehydration with hypotension. This is presumably a Crohn's flare. She was doing well after induction therapy but relapsed after Prednisone dose reduced. C-Diff is negative. Continue Solumedrol. Due to start maintenance dose of Remicade in 5 days. Patient is feeling better today after fluid repletion and pain meds. Her stools are not as loose today.  Continue clear liquids, IVF at 100cc/hr 2.  Mild hypokalemia, K+ 3.3. There is 3mq of potassium in IVF. Recheck BMET in am.     LOS: 1 day   PTye Savoy 09/12/2011, 10:42 AM

## 2011-09-12 NOTE — Progress Notes (Signed)
C Diff PCR Negative. Contact precautions dc'd per protocol.[name] [name]

## 2011-09-13 ENCOUNTER — Other Ambulatory Visit: Payer: Self-pay

## 2011-09-13 LAB — BASIC METABOLIC PANEL
BUN: 10 mg/dL (ref 6–23)
GFR calc Af Amer: >90 mL/min (ref >90)
GFR calc non Af Amer: 85 mL/min — ABNORMAL LOW (ref >90)
Potassium: 3.8 mEq/L (ref 3.5–5.1)
Sodium: 133 mEq/L — ABNORMAL LOW (ref 135–145)

## 2011-09-13 MED ORDER — FAMOTIDINE 20 MG PO TABS
20.0000 mg | ORAL_TABLET | Freq: Two times a day (BID) | ORAL | Status: DC | PRN
  Administered 2011-09-13 – 2011-09-15 (×2): 20 mg via ORAL
  Filled 2011-09-13 (×2): qty 1

## 2011-09-13 MED ORDER — LOSARTAN POTASSIUM 50 MG PO TABS
100.0000 mg | ORAL_TABLET | Freq: Every day | ORAL | Status: DC
  Administered 2011-09-13 – 2011-09-15 (×3): 100 mg via ORAL
  Filled 2011-09-13 (×4): qty 2

## 2011-09-13 MED ORDER — SODIUM CHLORIDE 0.9 % IV SOLN: 5.0000 mg/kg | INTRAVENOUS | Status: DC

## 2011-09-13 MED ORDER — SODIUM CHLORIDE 0.9 % IV SOLN: INTRAVENOUS | Status: DC

## 2011-09-13 MED ORDER — HYDROCHLOROTHIAZIDE 25 MG PO TABS
12.5000 mg | ORAL_TABLET | Freq: Every day | ORAL | Status: DC
  Administered 2011-09-13 – 2011-09-14 (×2): 12.5 mg via ORAL
  Filled 2011-09-13 (×3): qty 0.5

## 2011-09-13 MED ORDER — DIPHENHYDRAMINE HCL 25 MG PO CAPS: 50.0000 mg | ORAL_CAPSULE | ORAL | Status: DC

## 2011-09-13 MED ORDER — ACETAMINOPHEN 325 MG PO TABS: 650.0000 mg | ORAL_TABLET | Freq: Once | ORAL | Status: DC

## 2011-09-13 NOTE — Progress Notes (Signed)
Report from [place], [place]. Pt seen, reports diarrhea this afternoon but denies abd pain or nausea. States BLE feel "puffy", has mild BLE. BLE elevated on pillows. IVF infusing w/o difficulty. No further c/o.

## 2011-09-13 NOTE — Progress Notes (Signed)
[facility] Progress Note  SUBJECTIVE: feels even better today than yesterday. Only one BM, diarrhea resolved. Hungry  OBJECTIVE:  Vital signs in last 24 hours: Temp:  [98 F (36.7 C)-98.3 F (36.8 C)] 98.3 F (36.8 C) (02/21 0533) Pulse Rate:  [53-65] 53  (02/21 0533) Resp:  [18-20] 20  (02/21 0533) BP: (117-147)/(68-83) 117/68 mmHg (02/21 0533) SpO2:  [95 %-98 %] 98 % (02/21 0533) Last BM Date: 09/12/11 General:    Pleasant white female in NAD Heart:  Regular rate and rhythm Lungs: Respirations even and unlabored, lungs CTA bilaterally Abdomen:  Soft, nontender and nondistended. Normal bowel sounds. Extremities:  Without edema. Neurologic:  Alert and oriented,  grossly normal neurologically. Psych:  Cooperative. Normal mood and affect.   Lab Results:  BMET  Basename 09/13/11 0315 09/11/11 1923  NA 133* 131*  K 3.8 3.3*  CL 104 96  CO2 23 21  GLUCOSE 144* 134*  BUN 10 21  CREATININE 0.75 1.50*  CALCIUM 7.8* 9.0   LFT  Basename 09/11/11 1923  PROT 6.4  ALBUMIN 2.9*  AST 9  ALT 8  ALKPHOS 41  BILITOT 0.4  BILIDIR --  IBILI --    ASSESSMENT / PLAN:  1. Crohn's colitis, improving on IV steroids. Abdominal pain much better, diarrhea resolving.  C-Diff is negative. Due to start maintenance dose of Remicade on Monday.  Advance to full liquids, decrease IVF rate.  Patient wants to see a dietician to help with Crohn's.  2. Mild hypokalemia, resolved. Potassium normal today.    LOS: 2 days   [name]  09/13/2011, 9:22 AM

## 2011-09-13 NOTE — Progress Notes (Signed)
If she continues to improve can switch to p.o. meds in am

## 2011-09-14 MED ORDER — SODIUM CHLORIDE 0.9 % IV SOLN
5.0000 mg/kg | Freq: Once | INTRAVENOUS | Status: AC
  Administered 2011-09-14: 400 mg via INTRAVENOUS
  Filled 2011-09-14: qty 40

## 2011-09-14 MED ORDER — PREDNISONE 20 MG PO TABS
40.0000 mg | ORAL_TABLET | Freq: Every day | ORAL | Status: DC
  Administered 2011-09-14 – 2011-09-15 (×2): 40 mg via ORAL
  Filled 2011-09-14 (×3): qty 2

## 2011-09-14 MED ORDER — HYDROCHLOROTHIAZIDE 12.5 MG PO CAPS
12.5000 mg | ORAL_CAPSULE | Freq: Every day | ORAL | Status: DC
  Administered 2011-09-15: 12.5 mg via ORAL
  Filled 2011-09-14 (×2): qty 1

## 2011-09-14 NOTE — Progress Notes (Signed)
Patient was complaining of upper abdominal pain early in the shift. She reported that it was not like her colitis pain. It was more like indigestion. I called the MD on call for Dr. Carlean Purl and received an order for PO Pepcid. This did not give the pt relief. The patient stated the pain was worsening and was radiating upwards toward her chest.  I called the doctor on call again and received orders for a EKG and to resume the patients home BP medication. The EKG showed NSR. The patient was relieved her BP meds were restarted. Will continue to monitor. Cindi Carbon, RN

## 2011-09-14 NOTE — Progress Notes (Signed)
Reviewed and agree with management. Huck Ashworth D. Emrys Mceachron, M.D., FACG  

## 2011-09-14 NOTE — Plan of Care (Signed)
Problem: Food- and Nutrition-Related Knowledge Deficit (NB-1.1) Goal: Nutrition education Formal process to instruct or train a patient/client in a skill or to impart knowledge to help patients/clients voluntarily manage or modify food choices and eating behavior to maintain or improve health.  Outcome: Completed/Met Date Met:  09/14/11 RD consulted for low-residue diet education. Pt admitted for chron's colitis. Pt lives at home and states she tries to cook generally healthy. Pt admits to eating high fiber raw veggies and pizza with an alfredo sauce PTA. Pt provided with overview of ulcerative colitis nutrition therapy guidelines. Topics included foods to avoid during a flare up and re-introduction of foods into the diet slowly. Handouts provided from the Academy of [employer]. Questions answered. Pt verbalized understanding. Pt stated she is willing to follow these guidelines when she leaves the hospital. Expect good compliance with diet recommendations.  RD available if needed for any further nutrition issues.  Cleotilde Neer Pager #: [phone]

## 2011-09-14 NOTE — Progress Notes (Addendum)
[facility] Progress Note  SUBJECTIVE: feels good today, since BP meds restarted. Did have loose stools yesterday x5 (some with blood) after lunch but no significant diarrhea since then.   OBJECTIVE:  Vital signs in last 24 hours: Temp:  [98 F (36.7 C)-98.9 F (37.2 C)] 98 F (36.7 C) (02/22 0700) Pulse Rate:  [63-69] 63  (02/22 0700) Resp:  [18-20] 20  (02/22 0700) BP: (137-194)/(79-84) 137/79 mmHg (02/22 0700) SpO2:  [94 %-98 %] 94 % (02/22 0700) Weight:  [171 lb 4.8 oz (77.7 kg)] 171 lb 4.8 oz (77.7 kg) (02/22 0700) Last BM Date: 09/13/11 General:    Pleasant white female in NAD Heart:  Regular rate and rhythm Lungs: Respirations even and unlabored, lungs CTA bilaterally Abdomen:  Soft, nontender and nondistended. Normal bowel sounds. Extremities:  Trace BLE edema.  Neurologic:  Alert and oriented,  grossly normal neurologically. Psych:  Cooperative. Normal mood and affect.  Intake/Output from previous day: 02/21 0701 - 02/22 0700 In: 3030 [P.O.:960; I.V.:2070] Out: 1054 [Urine:1050; Stool:4]  Lab Results:  [facility] 09/11/11 1923  WBC 10.9*  HGB 13.3  HCT 39.6  PLT 442*   BMET  Basename 09/13/11 0315 09/11/11 1923  NA 133* 131*  K 3.8 3.3*  CL 104 96  CO2 23 21  GLUCOSE 144* 134*  BUN 10 21  CREATININE 0.75 1.50*  CALCIUM 7.8* 9.0   LFT  Basename 09/11/11 1923  PROT 6.4  ALBUMIN 2.9*  AST 9  ALT 8  ALKPHOS 41  BILITOT 0.4  BILIDIR --  IBILI --    ASSESSMENT / PLAN:  1. Crohn's colitis, improving on IV steroids. Will change to Prednisone 43m daily. I spoke with pharmacy and they are okay with patient getting her maintenance Remicade tthis admission. She is due Monday, will give it today and hopefully patient can go home in am if doing okay. I have arranged for her to see a dietician in house as well.  2. HTN, BP elevated last night. She had some epigastric pain.  EKG showed NSR. She felt much better after anti-hypertensiives resumed.  BP 137/79 today.       LOS: 3 days   PTye Savoy 09/14/2011, 11:03 AM

## 2011-09-15 MED ORDER — PANTOPRAZOLE SODIUM 40 MG PO TBEC: 40.0000 mg | DELAYED_RELEASE_TABLET | ORAL | Status: DC

## 2011-09-15 MED ORDER — PREDNISONE 20 MG PO TABS: 40.0000 mg | ORAL_TABLET | Freq: Every day | ORAL | Status: DC

## 2011-09-15 MED ORDER — HYDROCHLOROTHIAZIDE 12.5 MG PO CAPS: 12.5000 mg | ORAL_CAPSULE | Freq: Every day | ORAL | Status: DC

## 2011-09-15 MED ORDER — LOSARTAN POTASSIUM 100 MG PO TABS: 100.0000 mg | ORAL_TABLET | Freq: Every day | ORAL | Status: DC

## 2011-09-15 NOTE — Progress Notes (Signed)
Discharge orders written.  Duplicate prescriptions appeared on After-Visit Summary; MD notified and verbal orders to clarify received and appended to AVS.  Patient stable to relative assessment and ready for discharge.  Teaching done, including when to contact MD, next dose due, new Rx, and future appointments.  Patient and family verbalized understanding.

## 2011-09-15 NOTE — Progress Notes (Signed)
Patient ID: [name] [name], female   DOB: 01/05/1941, 71 y.o.   MRN: [id number]  Covering for [facility]  Subjective: She feels well.  No problems with Remicade yesterday.  Objective: Vital signs in last 24 hours: Temp:  [98 F (36.7 C)-98.9 F (37.2 C)] 98 F (36.7 C) (02/23 0620) Pulse Rate:  [56-72] 56  (02/23 0620) Resp:  [16-18] 16  (02/23 0620) BP: (144-149)/(65-85) 149/65 mmHg (02/23 0620) SpO2:  [93 %-99 %] 97 % (02/23 0620) Last BM Date: 09/15/11  Intake/Output from previous day: 02/22 0701 - 02/23 0700 In: 840 [P.O.:840] Out: 3000 [Urine:3000] Intake/Output this shift:    General appearance: alert and no distress GI: soft, non-tender; bowel sounds normal; no masses,  no organomegaly  Lab Results: No results found for this basename: WBC:3,HGB:3,HCT:3,PLT:3 in the last 72 hours BMET  [facility] 09/13/11 0315  NA 133*  K 3.8  CL 104  CO2 23  GLUCOSE 144*  BUN 10  CREATININE 0.75  CALCIUM 7.8*   LFT No results found for this basename: PROT,ALBUMIN,AST,ALT,ALKPHOS,BILITOT,BILIDIR,IBILI in the last 72 hours PT/INR No results found for this basename: LABPROT:2,INR:2 in the last 72 hours Hepatitis Panel No results found for this basename: HEPBSAG,HCVAB,HEPAIGM,HEPBIGM in the last 72 hours C-Diff No results found for this basename: CDIFFTOX:3 in the last 72 hours Fecal Lactopherrin No results found for this basename: FECLLACTOFRN in the last 72 hours  Studies/Results: No results found.  Medications:  Scheduled:   . hydrochlorothiazide  12.5 mg Oral Daily  . inFLIXimab (REMICADE) infusion  5 mg/kg Intravenous Once  . losartan  100 mg Oral Daily  . pantoprazole  40 mg Oral Q24H  . predniSONE  40 mg Oral QAC breakfast  . DISCONTD: hydrochlorothiazide  12.5 mg Oral Daily   Continuous:   . dextrose 5 % and 0.9 % NaCl with KCl 20 mEq/L 50 mL/hr at 09/14/11 1755    Assessment/Plan: 1) Crohn's disease   Patient is stable at this time.    Plan: 1)  Advance Diet. 2) D/C Home. 3) Follow up with [facility] in 1-2 weeks.  LOS: 4 days   [name] [name] D 09/15/2011, 1:45 PM

## 2011-09-17 ENCOUNTER — Encounter (HOSPITAL_COMMUNITY)
Admission: RE | Admit: 2011-09-17 | Discharge: 2011-09-17 | Disposition: A | Discharge status: Home / Self Care | Payer: Medicare Other | Source: Ambulatory Visit | Attending: Internal Medicine | Admitting: Internal Medicine

## 2011-09-17 DIAGNOSIS — K5090 Crohn's disease, unspecified, without complications: Secondary | ICD-10-CM

## 2011-09-18 ENCOUNTER — Telehealth: Payer: Self-pay | Admitting: *Deleted

## 2011-09-18 NOTE — Telephone Encounter (Signed)
Someone called the patient to ask if she was taking the Prednisone correctly.  I took the call and looked at Dr. Ulyses Amor notes and he wanted her to take the Prednisone 20 mg twice daily.  She said that is how she is taking it.  She asked if she is still scheduled to see Dr. Carlean Purl on 09-24-2011 at 10:45 AM.  I checked and her appt is still in Epic for that date. I advised her we will see her on that date.

## 2011-09-24 ENCOUNTER — Encounter: Payer: Self-pay | Admitting: Internal Medicine

## 2011-09-24 ENCOUNTER — Ambulatory Visit (INDEPENDENT_AMBULATORY_CARE_PROVIDER_SITE_OTHER): Payer: Medicare Other | Admitting: Internal Medicine

## 2011-09-24 VITALS — BP 136/80 | HR 62 | Ht 66.0 in | Wt 169.0 lb

## 2011-09-24 DIAGNOSIS — K5010 Crohn's disease of large intestine without complications: Secondary | ICD-10-CM

## 2011-09-24 DIAGNOSIS — IMO0002 Reserved for concepts with insufficient information to code with codable children: Secondary | ICD-10-CM

## 2011-09-24 DIAGNOSIS — D899 Disorder involving the immune mechanism, unspecified: Secondary | ICD-10-CM

## 2011-09-24 MED ORDER — PREDNISONE 20 MG PO TABS: ORAL_TABLET | ORAL | Status: DC

## 2011-09-24 MED ORDER — CALCIUM 1200 1200-1000 MG-UNIT PO CHEW: CHEWABLE_TABLET | ORAL | Status: DC

## 2011-09-24 MED ORDER — PEG-KCL-NACL-NASULF-NA ASC-C 100 G PO SOLR: 1.0000 | Freq: Once | ORAL | Status: DC

## 2011-09-24 MED ORDER — VITAMIN D 50 MCG (2000 UT) PO CAPS: 1.0000 | ORAL_CAPSULE | Freq: Every day | ORAL | Status: DC

## 2011-09-24 NOTE — Progress Notes (Signed)
Subjective:    Patient ID: [name] [name], female    DOB: January 24, 1941, 71 y.o.   MRN: [id number]  HPI The patient returns after being hospitalized for a Crohn's flare. She had completed her induction therapy with Remicade 5 mg per kilogram but when she got down to about 10 mg of prednisone daily her diarrhea and bleeding returned. She was dehydrated and was admitted and responded rapidly to IV hydration and increased dosage of steroids. At this point her bowel movements are formed up and she is not having bleeding. In the past one to 2 weeks she had her fourth dose of Remicade. He is not having any abdominal pain. She's had followup with primary care regarding her hypokalemia and blood pressure medications. She is again here with her son today. Allergies  Allergen Reactions  . Adhesive (Tape) Rash  . Sulfa Antibiotics Itching and Rash   Outpatient Prescriptions Prior to Visit  Medication Sig Dispense Refill  . b complex vitamins capsule Take 1 capsule by mouth daily.        . cetirizine (ZYRTEC) 10 MG tablet Take 10 mg by mouth daily as needed. allergies      . cholestyramine (QUESTRAN) 4 GM/DOSE powder Take 4 g by mouth 2 (two) times daily as needed.       . dicyclomine (BENTYL) 20 MG tablet Take 20 mg by mouth 3 (three) times daily. For stomach pain      . InFLIXimab (REMICADE IV) Inject into the vein. q 8 weeks at [place]      . losartan-hydrochlorothiazide (HYZAAR) 100-12.5 MG per tablet Take 1 tablet by mouth daily.        . Multiple Vitamins-Minerals (MULTIVITAMINS THER. W/MINERALS) TABS Take 1 tablet by mouth daily.        . Omega-3 Fatty Acids (FISH OIL) 1000 MG CAPS Take 1,000 mg by mouth daily.        . pantoprazole (PROTONIX) 40 MG tablet Take 40 mg by mouth daily.        . traMADol (ULTRAM) 50 MG tablet Take 50 mg by mouth 2 (two) times daily as needed. For pain. Maximum dose= 8 tablets per day      . predniSONE (DELTASONE) 20 MG tablet Take 2 tablets (40 mg total) by mouth  daily before breakfast.  60 tablet  0  . hydrochlorothiazide (MICROZIDE) 12.5 MG capsule Take 1 capsule (12.5 mg total) by mouth daily.  30 capsule  0  . losartan (COZAAR) 100 MG tablet Take 1 tablet (100 mg total) by mouth daily.  30 tablet  0  . pantoprazole (PROTONIX) 40 MG tablet Take 1 tablet (40 mg total) by mouth daily.  30 tablet  0           Past Medical History  Diagnosis Date  . Crohn's colitis   . Hypertension   . Colon polyps     adenomatous polyp June 2012  . Complication of anesthesia     slow to wake  . Shortness of breath   . Blood transfusion     1966  . Headache     hx migraines  . Arthritis     shoulder  . Bell's palsy   . GERD (gastroesophageal reflux disease)    Past Surgical History  Procedure Date  . Cholecystectomy   . Abdominal hysterectomy   . Tonsillectomy   . Shoulder surgery     right  . Foot ganglion excision   . Appendectomy   .  Flexible sigmoidoscopy 06/08/2011    severe Crohn's colitis to descending colon at least  . Colonoscopy 12/2010    sigmoid colitis, adenomatous polyp (Dr. Melina Copa)   History   Social History  . Marital Status: Widowed    Spouse Name: N/A    Number of Children: 3  . Years of Education: N/A   Occupational History  . retired     Social History Main Topics  . Smoking status: Former Research scientist (life sciences)  . Smokeless tobacco: Never Used  . Alcohol Use: Yes     rare wine  . Drug Use: No  . Sexually Active: No   Other Topics Concern  . None   Social History Narrative  . None   Family History  Problem Relation Age of Onset  . Leukemia Mother   . Anesthesia problems Neg Hx   . Hypotension Neg Hx   . Malignant hyperthermia Neg Hx   . Pseudochol deficiency Neg Hx   . Heart disease Mother   . Heart disease Father   . Colon cancer Neg Hx   . Diabetes Neg Hx   . Breast cancer Paternal Aunt     1/2 aunt  . Throat cancer Paternal Aunt     1/2 aunt  . Ovarian cancer Paternal Aunt     1/2 aunt  . Parkinsonism  Brother   . Heart disease Brother   . Leukemia Maternal Grandfather         Review of Systems She feels weak and worn out in the week after she receives Remicade.    Objective:   Physical Exam General:  NAD Eyes:   anicteric Lungs:  clear Heart:  S1S2 no rubs, murmurs or gallops Abdomen:  soft and nontender, BS+ Ext:   no edema    Data Reviewed:     Chemistry      Component Value Date/Time   NA 133* 09/13/2011 0315   K 3.8 09/13/2011 0315   CL 104 09/13/2011 0315   CO2 23 09/13/2011 0315   BUN 10 09/13/2011 0315   CREATININE 0.75 09/13/2011 0315      Component Value Date/Time   CALCIUM 7.8* 09/13/2011 0315   ALKPHOS 41 09/11/2011 1923   AST 9 09/11/2011 1923   ALT 8 09/11/2011 1923   BILITOT 0.4 09/11/2011 1923     Lab Results  Component Value Date   WBC 10.9* 09/11/2011   HGB 13.3 09/11/2011   HCT 39.6 09/11/2011   MCV 89.8 09/11/2011   PLT 442* 09/11/2011          Assessment & Plan:   1. Crohn's colitis   2. Chronic steroid use   3. Immunosuppression     She is improved at this point. She is on 40 mg prednisone daily. She has received her fourth dose of Remicade starting maintenance therapy at 5 mg per kilogram. Normally people respond fashion this and are either on lower dose steroids her off them at this point though not always. I've explained this to the patient and her son. It may take more time for the Remicade to have it's affected to be able to reduce the steroid dose, and hopefully eliminate these.  We reviewed steroid side effects today I've asked her to start vitamin B 2000 units a day and 1200 mg calcium daily. She could need or aggressive therapy, however will probably wait and get a DEXA scan at some point in the she's had one done relatively recently. I don't think that is urgent.  Plan for colonoscopy to investigate mucosal healing. Her situation has been somewhat confusing and I think that information will be valuable. The risks and benefits as  well as alternatives of endoscopic procedure(s) have been discussed and reviewed. All questions answered. The patient agrees to proceed.  Ending at May need to consider higher dose Remicade therapy at some point, or adding other therapy. I have produced her prednisone to 30 mg daily for now.  [place] copy to [facility]

## 2011-09-24 NOTE — Assessment & Plan Note (Signed)
Improved, on 40 mg prednisone daily, has just received fourth dose of Remicade starting maintenance therapy. Plan for colonoscopy to assess mucosa and help sort out treatment plan. Advised to start vitamin D 2000 units daily and calcium supplementation. Anticipate DEXA scan at some point in the next few months with sort out the Crohn's treatment issues.

## 2011-09-24 NOTE — Patient Instructions (Signed)
You have been scheduled for a colonoscopy with propofol. Please follow written instructions given to you at your visit today.  Please pick up your prep kit at the pharmacy within the next 1-3 days. Please purchase Vitamin D3 2000 IU to take once daily. Please purchase Calcium 1200Mg. Chews or capsules and take one daily. Dr Carlean Purl has advised that you decrease prednisone.  Instructions are as follows: Take 1.5 tablets (63m) by mouth daily until your colonoscopy with Dr GCarlean Purl

## 2011-09-25 ENCOUNTER — Telehealth: Payer: Self-pay | Admitting: *Deleted

## 2011-09-25 NOTE — Telephone Encounter (Signed)
Received a call from [name], NP. Need to verify that patient went home on Prednisone. Spoke with patient and she went home on 40 mg/daily and Dr. Carlean Purl changed it to 30 mg/day yesterday. [name], NP notified.

## 2011-09-25 NOTE — Discharge Summary (Signed)
[name] [name]  Name: [name] [name] MRN: [id number] DOB: [birth year] 71 y.o. PCP:  [name], NP, NP  Date of Admission: 09/11/2011  4:05 PM Date of Discharge: 09/15/2011 Attending Physician: [name], MD Patient's Primary GI:  [name], MD  Discharge Diagnosis: 1. Crohn's colitis flare,markedly improved by time of discharge 2. Dehydration secondary to diarrhea, resolved 3. HTN, controlled on antihypertensives.  Consultations:  none  Procedures Performed:  Acute Abdominal Series  09/11/2011  *RADIOLOGY REPORT*  Clinical Data: 71 year old female with abdominal pain.  Crohn disease.  ACUTE ABDOMEN SERIES (ABDOMEN 2 VIEW & CHEST 1 VIEW)  Comparison: CT abdomen and pelvis 06/07/2011.  Findings: Normal lung volumes. Normal cardiac size and mediastinal contours.  Visualized tracheal air column is within normal limits. The lungs are clear.  No pneumothorax or pneumoperitoneum.  Right upper quadrant surgical clips.  No dilated bowel loops. Decompressed colon versus distal colonic wall thickening.  No abnormal small bowel loops identified. No acute osseous abnormality identified.  IMPRESSION: 1. Nonobstructed bowel gas pattern, no free air. 2.  Negative chest. 3.  Questionable inflammatory distal colonic wall thickening.  Original Report Authenticated By: [name] [name], M.D.   GI Procedures: none  History/Physical Exam:  See Admission H&P  Admission HPI:  Done by Dr.[name]. This 71 year old white woman is here with her son. She has Crohn's colitis, thought to be left-sided but maybe more diffuse. She was hospitalized in late 2012 and started on Remicade for steroid refractory disease. She was showing improvement but then she came down off of prednisone while being induced with Remicade she developed recurrent symptoms so prednisone was restarted at the end of January. She took 40 mg daily for a week and then 30 mg daily and now is down to 20 mg daily. She is  complaining of very crampy and intense abdominal pain prior to defecation with loose urgent defecation associated with incontinence. She had some bleeding when she initially called although now the stools are yellow and loose. He has been hot and cold flashes but has not had [name] obvious fever that we know of. She took some narcotics she had left over from a prior orthopedic issue which helped to get through the night but she is not sleeping well due to the pain and the diarrhea. She says this is similar to her other flares that she has had. She vomited once last night that has not been a predominant feature. She is due for her next Remicade in about 6 days. This would be the first dose of every 8 week regimen.   Hospital Course by problem list: 1. Crohn's colitis- patient started on IV steroids upon admission. Her bloody diarrhea and abdominal pain resolved almost immediately upon admission. On the third day of admission patient did have some recurrent diarrhea but it was self limiting. C-Diff PCR was negative. Patient had already completed Remicade induction therapy and was due to start maintenance therapy within a few days. Fortunately patient was able to go ahead and get her maintenance infusion while hospitalized. She tolerated the infusion well and was discharged home by Dr. Benson Norway on 67m Prednisone daily. Patient would follow up with Dr. GCarlean Purlwithin the next 7-10 days.  2. HTN. Upon admission patient's anti-hypertensives were held secondary to hypovolemia. A couple of nights prior to discharge patient develpped epigastric / chest discomfort in setting of elevated BP. Her home anti-hypertensives were restarted with resolution of epigastric / distal chest pain.   Discharge Vitals:  BP 147/75  Pulse 62  Temp(Src) 98.1 F (36.7 C) (Oral)  Resp 16  Ht 5' 6"  (1.676 m)  Wt 171 lb 4.8 oz (77.7 kg)  BMI 27.65 kg/m2  SpO2 98%  Discharge Labs: No results found for this or any previous visit (from the  past 24 hour(s)).  Disposition and follow-up:   [name] [name] was discharged from [facility] in stable condition.    Follow-up Appointments: Discharge Orders    Future Appointments: Provider: Department: Dept Phone: Center:   09/27/2011 9:00 AM [name], [place] [phone] [facility]   11/12/2011 9:00 AM [facility]  None   01/07/2012 9:00 AM [facility]  None      Discharge Medications: Medication List  As of 09/25/2011  3:40 PM   STOP taking these medications         predniSONE 10 MG tablet         TAKE these medications         b complex vitamins capsule   Take 1 capsule by mouth daily.      cetirizine 10 MG tablet   Commonly known as: ZYRTEC   Take 10 mg by mouth daily as needed. allergies      cholestyramine 4 GM/DOSE powder   Commonly known as: QUESTRAN   Take 4 g by mouth 2 (two) times daily as needed.      dicyclomine 20 MG tablet   Commonly known as: BENTYL   Take 20 mg by mouth 3 (three) times daily. For stomach pain      Fish Oil 1000 MG Caps   Take 1,000 mg by mouth daily.      losartan-hydrochlorothiazide 100-12.5 MG per tablet   Commonly known as: HYZAAR   Take 1 tablet by mouth daily.      multivitamins ther. w/minerals Tabs   Take 1 tablet by mouth daily.      pantoprazole 40 MG tablet   Commonly known as: PROTONIX   Take 40 mg by mouth daily.      REMICADE IV   Inject into the vein. q 8 weeks at [facility] hospital      traMADol 50 MG tablet   Commonly known as: ULTRAM   Take 50 mg by mouth 2 (two) times daily as needed. For pain. Maximum dose= 8 tablets per day            Signed: [name] 09/25/2011, 3:40 PM

## 2011-09-27 ENCOUNTER — Ambulatory Visit (AMBULATORY_SURGERY_CENTER): Payer: Medicare Other | Admitting: Internal Medicine

## 2011-09-27 ENCOUNTER — Encounter: Payer: Self-pay | Admitting: Internal Medicine

## 2011-09-27 VITALS — BP 112/82 | HR 94 | Temp 98.4°F | Resp 17 | Ht 66.0 in | Wt 169.0 lb

## 2011-09-27 DIAGNOSIS — K5289 Other specified noninfective gastroenteritis and colitis: Secondary | ICD-10-CM

## 2011-09-27 DIAGNOSIS — D126 Benign neoplasm of colon, unspecified: Secondary | ICD-10-CM

## 2011-09-27 DIAGNOSIS — K645 Perianal venous thrombosis: Secondary | ICD-10-CM

## 2011-09-27 DIAGNOSIS — K5010 Crohn's disease of large intestine without complications: Secondary | ICD-10-CM

## 2011-09-27 DIAGNOSIS — K5730 Diverticulosis of large intestine without perforation or abscess without bleeding: Secondary | ICD-10-CM

## 2011-09-27 MED ORDER — PREDNISONE 20 MG PO TABS: ORAL_TABLET | ORAL | Status: DC

## 2011-09-27 MED ORDER — SODIUM CHLORIDE 0.9 % IV SOLN: 500.0000 mL | INTRAVENOUS | Status: DC

## 2011-09-27 MED ORDER — HYDROCORTISONE 2.5 % RE CREA: TOPICAL_CREAM | Freq: Two times a day (BID) | RECTAL | Status: AC | PRN

## 2011-09-27 NOTE — Progress Notes (Signed)
Patient did not experience any of the following events: a burn prior to discharge; a fall within the facility; wrong site/side/patient/procedure/implant event; or a hospital transfer or hospital admission upon discharge from the facility. (G8907) Patient did not have preoperative order for IV antibiotic SSI prophylaxis. (G8918)  

## 2011-09-27 NOTE — Progress Notes (Signed)
Pressure applied to the abdomen to reach cecum

## 2011-09-27 NOTE — Op Note (Signed)
[facility] [address] [place], [place]  [id number]  COLONOSCOPY PROCEDURE REPORT  PATIENT:  [name] [name], [name] [name]  MR#:  [id number] BIRTHDATE:  November 13, 1940, 66 yrs. old  GENDER:  female ENDOSCOPIST:  [name], MD, [facility]  PROCEDURE DATE:  09/27/2011 PROCEDURE:  Colonoscopy with biopsy and snare polypectomy ASA CLASS:  Class II INDICATIONS:  Crohn's disease requiring steroids despite Remicade initiation - assess disease activity MEDICATIONS:   These medications were titrated to patient response per physician's verbal order, Fentanyl 75 mcg, Versed 9 mg IV  DESCRIPTION OF PROCEDURE:   After the risks benefits and alternatives of the procedure were thoroughly explained, informed consent was obtained.  Digital rectal exam was performed and revealed prolasped hemorrhoids.  thrombosed and prolapsed internal hemorrhoid - not tender The LB [id number] endoscope was introduced through the anus and advanced to the terminal ileum which was intubated for a short distance, without limitations. The quality of the prep was excellent, using MoviPrep.  The instrument was then slowly withdrawn as the colon was fully examined. <<PROCEDUREIMAGES>>  FINDINGS:  Four polyps were found. All diminutive and erythematous - ascending, transverse and descending (2). One descending removed with cold snare and others removed cold biopsy with surrounding tissue bites.  Moderate diverticulosis was found in the sigmoid colon.  This was otherwise a normal examination of the colon and 10-15 cm of terminal ileum.  Random colon biopsies were obtained and sent to pathology.   Retroflexed views in the rectum revealed internal hemorrhoids.    The time to cecum = 3:02 minutes. The scope was then withdrawn in 19:47 minutes from the cecum and the procedure completed. COMPLICATIONS:  None ENDOSCOPIC IMPRESSION: 1) Four diminutive polyps removed 2) Moderate diverticulosis in the sigmoid colon 3)  Otherwise normal examination, no active Crohn's seen - random biopsies taken 4) Internal hemorrhoids with one thrombosed and prolpased. RECOMMENDATIONS: 1) Reduce prednisone to 20 mg daily 2) Sitz baths and proctocream HC for henmorrhoid 3) will call results and plans next week REPEAT EXAM:  In for Colonoscopy, pending biopsy results.  [name], MD, [name]  CC:  The Patient  n. eSIGNED:   [name] at 09/27/2011 10:17 AM  [place], [place], [phone]

## 2011-09-27 NOTE — Patient Instructions (Addendum)
I did not see any active Crohn's disease! You had four tiny polyps removed, they might be part of resolving inflammation. I will let you know. There is a swollen and thrombosed hemorrhoid. I prescribed hydrocortisone cream - apply that with your finger to the hemorrhoid and soak in a warm bath at least once a day. Reduce prednisone to 1 tablet (20 mg) daily. My office will call results and plans next week.  [name], MD, FACG   YOU HAD AN ENDOSCOPIC PROCEDURE TODAY AT [facility]: Refer to the procedure report that was given to you for any specific questions about what was found during the examination.  If the procedure report does not answer your questions, please call your gastroenterologist to clarify.  If you requested that your care partner not be given the details of your procedure findings, then the procedure report has been included in a sealed envelope for you to review at your convenience later.  YOU SHOULD EXPECT: Some feelings of bloating in the abdomen. Passage of more gas than usual.  Walking can help get rid of the air that was put into your GI tract during the procedure and reduce the bloating. If you had a lower endoscopy (such as a colonoscopy or flexible sigmoidoscopy) you may notice spotting of blood in your stool or on the toilet paper. If you underwent a bowel prep for your procedure, then you may not have a normal bowel movement for a few days.  DIET: Your first meal following the procedure should be a light meal and then it is ok to progress to your normal diet.  A half-sandwich or bowl of soup is an example of a good first meal.  Heavy or fried foods are harder to digest and may make you feel nauseous or bloated.  Likewise meals heavy in dairy and vegetables can cause extra gas to form and this can also increase the bloating.  Drink plenty of fluids but you should avoid alcoholic beverages for 24 hours.  ACTIVITY: Your care partner should take you home  directly after the procedure.  You should plan to take it easy, moving slowly for the rest of the day.  You can resume normal activity the day after the procedure however you should NOT DRIVE or use heavy machinery for 24 hours (because of the sedation medicines used during the test).    SYMPTOMS TO REPORT IMMEDIATELY: A gastroenterologist can be reached at any hour.  During normal business hours, 8:30 AM to 5:00 PM Monday through Friday, call [phone].  After hours and on weekends, please call the GI answering service at [phone] who will take a message and have the physician on call contact you.   Following lower endoscopy (colonoscopy or flexible sigmoidoscopy):  Excessive amounts of blood in the stool  Significant tenderness or worsening of abdominal pains  Swelling of the abdomen that is new, acute  Fever of 100F or higher  FOLLOW UP: If any biopsies were taken you will be contacted by phone or by letter within the next 1-3 weeks.  Call your gastroenterologist if you have not heard about the biopsies in 3 weeks.  Our staff will call the home number listed on your records the next business day following your procedure to check on you and address any questions or concerns that you may have at that time regarding the information given to you following your procedure. This is a courtesy call and so if there is no  answer at the home number and we have not heard from you through the emergency physician on call, we will assume that you have returned to your regular daily activities without incident.  SIGNATURES/CONFIDENTIALITY: You and/or your care partner have signed paperwork which will be entered into your electronic medical record.  These signatures attest to the fact that that the information above on your After Visit Summary has been reviewed and is understood.  Full responsibility of the confidentiality of this discharge information lies with you and/or your care-partner.

## 2011-09-28 ENCOUNTER — Telehealth: Payer: Self-pay | Admitting: *Deleted

## 2011-09-28 NOTE — Telephone Encounter (Signed)
  Follow up Call-  Call back number 09/27/2011  Post procedure Call Back phone  # [phone]  Permission to leave phone message Yes     Patient questions:  Do you have a fever, pain , or abdominal swelling? no Pain Score  0 *  Have you tolerated food without any problems? yes  Have you been able to return to your normal activities? yes  Do you have any questions about your discharge instructions: Diet   no Medications  no Follow up visit  no  Do you have questions or concerns about your Care? no  Actions: * If pain score is 4 or above: No action needed, pain <4.

## 2011-10-01 NOTE — Discharge Summary (Signed)
Agree with [name]'s assessment and plan. [name] Mayer, MD, [name] [name]

## 2011-10-02 ENCOUNTER — Encounter: Payer: Self-pay | Admitting: Internal Medicine

## 2011-10-02 NOTE — Progress Notes (Signed)
Quick Note:  Minimally active colitis and inflammatory polyps  ______

## 2011-10-02 NOTE — Progress Notes (Signed)
Quick Note:  Office: Let her know biopsies showed minimally active colitis - much better and the polyps are not a problem  1) On 3/15 reduce prednisone to 15 mg daily 2) on 3/22 reduce prednisone to 10 mg daily 3) On 4/1 reduce prednisone to 5 mg daily 4) On 4/11 reduce prednisone to 5 mg daily 5) Stop prednisone on 4/22  If she needs 10 mg tabs of prednisone please rx #[id number] take as directed no refills  6) REV with me in about 6 weeks   [facility]:  No letter and no recall  Off ______

## 2011-10-29 ENCOUNTER — Other Ambulatory Visit (INDEPENDENT_AMBULATORY_CARE_PROVIDER_SITE_OTHER): Payer: Medicare Other

## 2011-10-29 ENCOUNTER — Telehealth: Payer: Self-pay | Admitting: Internal Medicine

## 2011-10-29 ENCOUNTER — Ambulatory Visit (INDEPENDENT_AMBULATORY_CARE_PROVIDER_SITE_OTHER): Payer: Medicare Other | Admitting: Internal Medicine

## 2011-10-29 ENCOUNTER — Encounter: Payer: Self-pay | Admitting: Internal Medicine

## 2011-10-29 VITALS — BP 120/74 | HR 88 | Ht 66.0 in | Wt 162.0 lb

## 2011-10-29 DIAGNOSIS — R197 Diarrhea, unspecified: Secondary | ICD-10-CM

## 2011-10-29 DIAGNOSIS — K921 Melena: Secondary | ICD-10-CM

## 2011-10-29 DIAGNOSIS — K5010 Crohn's disease of large intestine without complications: Secondary | ICD-10-CM

## 2011-10-29 LAB — CBC WITH DIFFERENTIAL/PLATELET
Basophils Absolute: 0 10*3/uL (ref 0.0–0.1)
Eosinophils Absolute: 0 10*3/uL (ref 0.0–0.7)
Eosinophils Relative: 0.1 % (ref 0.0–5.0)
HCT: 46 % (ref 36.0–46.0)
Lymphs Abs: 2.1 10*3/uL (ref 0.7–4.0)
MCHC: 33.1 g/dL (ref 30.0–36.0)
MCV: 91.4 fl (ref 78.0–100.0)
Monocytes Absolute: 1.1 10*3/uL — ABNORMAL HIGH (ref 0.1–1.0)
Neutrophils Relative %: 81.6 % — ABNORMAL HIGH (ref 43.0–77.0)
Platelets: 468 10*3/uL — ABNORMAL HIGH (ref 150.0–400.0)
RDW: 15.1 % — ABNORMAL HIGH (ref 11.5–14.6)

## 2011-10-29 MED ORDER — PREDNISONE (PAK) 10 MG PO TABS: 40.0000 mg | ORAL_TABLET | Freq: Every day | ORAL | Status: DC

## 2011-10-29 MED ORDER — PANTOPRAZOLE SODIUM 40 MG PO TBEC: 40.0000 mg | DELAYED_RELEASE_TABLET | Freq: Every day | ORAL | Status: DC

## 2011-10-29 NOTE — Telephone Encounter (Signed)
Patient has had 8 episodes of bloody diarrhea today.  I have added her on for today at 3:45 with Dr Carlean Purl

## 2011-10-29 NOTE — Patient Instructions (Signed)
You have been scheduled for a CT scan of the abdomen and pelvis at [facility] ([address]---this is in the same building as [facility]).   You are scheduled on 11/02/11 at 10:00am. You should arrive at 9:00am. Please follow the written instructions below on the day of your exam:  WARNING: IF YOU ARE ALLERGIC TO IODINE/X-RAY DYE, PLEASE NOTIFY RADIOLOGY IMMEDIATELY AT [phone]! YOU WILL BE GIVEN A 13 HOUR PREMEDICATION PREP.  1) Do not eat or drink anything after 6:00am (4 hours prior to your test)  The purpose of you drinking the oral contrast is to aid in the visualization of your intestinal tract. The contrast solution may cause some diarrhea. Before your exam is started, you will be given a small amount of fluid to drink. Depending on your individual set of symptoms, you may also receive an intravenous injection of x-ray contrast/dye. Plan on being at [facility] for 30 minutes or long, depending on the type of exam you are having performed.  If you have any questions regarding your exam or if you need to reschedule, you may call the CT department at [phone] between the hours of 8:00 am and 5:00 pm, Monday-Friday.   Your physician has requested that you go to the basement for the following lab work before leaving today: CBC, CMET, CRP, Stool culture  Prescriptions have been sent to your pharmacy for you to pick up.  Please force fluids.  We will contact you with any medicine changes after we get your lab results.     ________________________________________________________________________

## 2011-10-29 NOTE — Progress Notes (Signed)
  Subjective:    Patient ID: [name] [name], female    DOB: 1941-04-22, 71 y.o.   MRN: [id number]  HPI This elderly white woman with Crohn's colitis since the several day history of diarrhea and bleeding. She had been tapering her prednisone. When she got down to about 5 mg she started having increasing diarrhea. She is having some crampy abdominal pain. Last week she was able to finally, stop her prednisone. However diarrhea intensified and she started having bloody diarrhea. To 10 stools a day, and this will disturb her sleep as well. She also feels like food is sitting in her epigastrium after she eats. She than mildly nauseous there's been no vomiting, no fever. This is very similar to what she's been given the past when she's come down on her prednisone with a taper. She is due for a Remicade infusion about 2 weeks. That will be her fifth infusion overall.  These problems began [place] the [place]. She did not have any sick contacts am aware of, her son was with her and he did not get ill. She did have myalgias at one point.  Medications, allergies, past medical history, past surgical history, family history and social history are reviewed and updated in the EMR.   Review of Systems As above    Objective:   Physical Exam General:  NAD Eyes:   anicteric Lungs:  clear Heart:  S1S2 no rubs, murmurs or gallops Abdomen:  soft and nontender, BS+ Rectal: Female staff present. Digital rectal exam reveals rusty colored liquid stool. Nontender, no mass Ext:   no edema    Data Reviewed:   Colonoscopy in February showed minimally active colitis on biopsies. The colon itself looked relatively normal. There were some hemorrhoids and inflammatory polyps.        Assessment & Plan:   1. Crohn's colitis   2. Diarrhea   3. Hematochezia   4. epigastric pain  She does not seem to be able to wean off steroids completely. I am going to have her do a stool culture, she completed a C.  difficile PCR in February that was negative, restart prednisone at 40 mg daily but hopefully not for long period CT enterography will be ordered. CBC comprehensive metabolic panel and C. reactive protein will be checked as well. She will force fluids. She does not appear dehydrated but she understands the risk of that. May need repeat colon evaluation, could need upper endoscopy. I know she has hemorrhoids but I don't think the bleeding is from that they could be contributing it sounds like her Crohn's is flaring again.  Other things to consider would be trying to add an immunomodulators, increasing the dose or frequency of Remicade infusion. Await this workup. Await response to high dose prednisone. She responded quickly to increase in the dose of her prednisone when she had a flare like this the other month. However she was hospitalized and did get IV fluids and IV Solu-Medrol then, but the diarrhea rapidly improved.  Cc: [name], NP

## 2011-10-30 ENCOUNTER — Other Ambulatory Visit: Payer: Medicare Other

## 2011-10-30 DIAGNOSIS — R197 Diarrhea, unspecified: Secondary | ICD-10-CM

## 2011-10-30 DIAGNOSIS — K5010 Crohn's disease of large intestine without complications: Secondary | ICD-10-CM

## 2011-10-30 DIAGNOSIS — K921 Melena: Secondary | ICD-10-CM

## 2011-10-30 LAB — COMPREHENSIVE METABOLIC PANEL
AST: 19 U/L (ref 0–37)
Alkaline Phosphatase: 67 U/L (ref 39–117)
Glucose, Bld: 98 mg/dL (ref 70–99)
Potassium: 3.3 mEq/L — ABNORMAL LOW (ref 3.5–5.1)
Sodium: 133 mEq/L — ABNORMAL LOW (ref 135–145)
Total Bilirubin: 0.7 mg/dL (ref 0.3–1.2)
Total Protein: 6.8 g/dL (ref 6.0–8.3)

## 2011-10-30 LAB — C-REACTIVE PROTEIN: CRP: 3 mg/dL — ABNORMAL HIGH (ref <0.60)

## 2011-10-31 ENCOUNTER — Other Ambulatory Visit: Payer: Self-pay

## 2011-10-31 DIAGNOSIS — K5090 Crohn's disease, unspecified, without complications: Secondary | ICD-10-CM

## 2011-10-31 NOTE — Progress Notes (Signed)
Quick Note:  Please have her do BMET tomorrow Reduce prednisone to 20 mg daily tomorrow also Further plans pending lab review and follow-up (diarrhea and bleeding are improved) ______

## 2011-10-31 NOTE — Progress Notes (Signed)
Quick Note:  Creatinine is elevated so will not be able to get ct enterography - need to cancel  Please get an update re: diarrhea and bleeding  Will need repeat bmet on Thursday ______

## 2011-11-01 ENCOUNTER — Other Ambulatory Visit (INDEPENDENT_AMBULATORY_CARE_PROVIDER_SITE_OTHER): Payer: Medicare Other

## 2011-11-01 DIAGNOSIS — K5090 Crohn's disease, unspecified, without complications: Secondary | ICD-10-CM

## 2011-11-01 LAB — BASIC METABOLIC PANEL
BUN: 44 mg/dL — ABNORMAL HIGH (ref 6–23)
CO2: 23 mEq/L (ref 19–32)
Chloride: 99 mEq/L (ref 96–112)
Creatinine, Ser: 2.5 mg/dL — ABNORMAL HIGH (ref 0.4–1.2)
Glucose, Bld: 124 mg/dL — ABNORMAL HIGH (ref 70–99)
Potassium: 4.1 mEq/L (ref 3.5–5.1)

## 2011-11-01 NOTE — Progress Notes (Signed)
Quick Note:  I called results to her and told her to hold lisinopril HCTZ until further notice She says diarrhea and bleeding are less Is forcing fluids  She needs to see PCP Heide Scales -PA-[facility] for follow-up on Monday 4/15 for recheck of kidney function and BP - please send her a copy of this lab and note - I asked the patient to call them for an appointment  Also plan phone call follow-up by [place] Monday 4/15 re: adjusting prednisone dose ______

## 2011-11-01 NOTE — Progress Notes (Signed)
Quick Note:  See previous note ______

## 2011-11-02 ENCOUNTER — Other Ambulatory Visit: Payer: Medicare Other

## 2011-11-05 NOTE — Progress Notes (Signed)
She is going to need to start 6 MP so will need a TPMT phenotype  Did she go to PCP re: follow-up abnormal creatinine - please remind her  Also see if we can move up her Remicade to every 6 weeks  REV me next  week

## 2011-11-08 ENCOUNTER — Ambulatory Visit: Payer: Medicare Other | Admitting: Internal Medicine

## 2011-11-09 ENCOUNTER — Other Ambulatory Visit: Payer: Self-pay

## 2011-11-09 ENCOUNTER — Telehealth: Payer: Self-pay

## 2011-11-09 DIAGNOSIS — K5090 Crohn's disease, unspecified, without complications: Secondary | ICD-10-CM

## 2011-11-09 NOTE — Telephone Encounter (Signed)
Patient aware all labs ordered

## 2011-11-09 NOTE — Telephone Encounter (Signed)
phenotype

## 2011-11-09 NOTE — Telephone Encounter (Signed)
TPMT phenotype or genotype?

## 2011-11-09 NOTE — Telephone Encounter (Signed)
CBC also thanks

## 2011-11-09 NOTE — Telephone Encounter (Signed)
Patient was contacted for a symptom update.  She reports semi-formed stools x 4 mostly in the am, minimal amount of blood in the stool.  Current prednisone dose is 20 mg.  She is scheduled for a Remicade infusion on Monday.  She will come for BMET and TPMT on Monday prior to Remicade.  Please advise what labs you want

## 2011-11-12 ENCOUNTER — Encounter (HOSPITAL_COMMUNITY)
Admission: RE | Admit: 2011-11-12 | Discharge: 2011-11-12 | Disposition: A | Discharge status: Home / Self Care | Payer: Medicare Other | Source: Ambulatory Visit | Attending: Internal Medicine | Admitting: Internal Medicine

## 2011-11-12 ENCOUNTER — Other Ambulatory Visit (INDEPENDENT_AMBULATORY_CARE_PROVIDER_SITE_OTHER): Payer: Medicare Other

## 2011-11-12 VITALS — BP 145/72 | HR 62 | Temp 98.2°F | Resp 18 | Wt 165.0 lb

## 2011-11-12 DIAGNOSIS — K5090 Crohn's disease, unspecified, without complications: Secondary | ICD-10-CM

## 2011-11-12 LAB — BASIC METABOLIC PANEL
BUN: 10 mg/dL (ref 6–23)
Calcium: 8.6 mg/dL (ref 8.4–10.5)
GFR: 75.36 mL/min (ref >60.00)
Glucose, Bld: 82 mg/dL (ref 70–99)
Sodium: 140 mEq/L (ref 135–145)

## 2011-11-12 LAB — CBC WITH DIFFERENTIAL/PLATELET
Eosinophils Absolute: 0.1 10*3/uL (ref 0.0–0.7)
Eosinophils Relative: 0.4 % (ref 0.0–5.0)
HCT: 38.1 % (ref 36.0–46.0)
Lymphs Abs: 1.4 10*3/uL (ref 0.7–4.0)
MCHC: 33.1 g/dL (ref 30.0–36.0)
MCV: 92 fl (ref 78.0–100.0)
Monocytes Absolute: 0.9 10*3/uL (ref 0.1–1.0)
Neutrophils Relative %: 91.6 % — ABNORMAL HIGH (ref 43.0–77.0)
Platelets: 447 10*3/uL — ABNORMAL HIGH (ref 150.0–400.0)
WBC: 29.2 10*3/uL (ref 4.5–10.5)

## 2011-11-12 MED ORDER — ACETAMINOPHEN 325 MG PO TABS
650.0000 mg | ORAL_TABLET | Freq: Once | ORAL | Status: AC
  Administered 2011-11-12: 650 mg via ORAL

## 2011-11-12 MED ORDER — ACETAMINOPHEN 325 MG PO TABS
ORAL_TABLET | ORAL | Status: AC
  Filled 2011-11-12: qty 2

## 2011-11-12 MED ORDER — DIPHENHYDRAMINE HCL 25 MG PO CAPS
ORAL_CAPSULE | ORAL | Status: AC
  Filled 2011-11-12: qty 2

## 2011-11-12 MED ORDER — SODIUM CHLORIDE 0.9 % IV SOLN
5.0000 mg/kg | INTRAVENOUS | Status: DC
  Administered 2011-11-12: 400 mg via INTRAVENOUS
  Filled 2011-11-12: qty 40

## 2011-11-12 MED ORDER — SODIUM CHLORIDE 0.9 % IV SOLN
INTRAVENOUS | Status: DC
  Administered 2011-11-12: 09:00:00 via INTRAVENOUS

## 2011-11-12 MED ORDER — DIPHENHYDRAMINE HCL 25 MG PO TABS
50.0000 mg | ORAL_TABLET | ORAL | Status: DC
  Administered 2011-11-12: 50 mg via ORAL

## 2011-11-12 MED ORDER — ACETAMINOPHEN ER 650 MG PO TBCR: 650.0000 mg | EXTENDED_RELEASE_TABLET | ORAL | Status: DC

## 2011-11-12 NOTE — Discharge Instructions (Signed)
Infliximab injection What is this medicine? INFLIXIMAB (in [place] i mab) is used to treat Crohn's disease and ulcerative colitis. It is also used to treat ankylosing spondylitis, psoriasis, and some forms of arthritis. This medicine may be used for other purposes; ask your health care provider or pharmacist if you have questions. What should I tell my health care provider before I take this medicine? They need to know if you have any of these conditions: -diabetes -exposure to tuberculosis -heart failure -hepatitis or liver disease -immune system problems -infection -lung or breathing disease, like COPD -multiple sclerosis -current or past resident of [place] or [place] -seizure disorder -an unusual or allergic reaction to infliximab, mouse proteins, other medicines, foods, dyes, or preservatives -pregnant or trying to get pregnant -breast-feeding How should I use this medicine? This medicine is for injection into a vein. It is usually given by a health care professional in a hospital or clinic setting. A special MedGuide will be given to you by the pharmacist with each prescription and refill. Be sure to read this information carefully each time. Talk to your pediatrician regarding the use of this medicine in children. Special care may be needed. Overdosage: If you think you have taken too much of this medicine contact a poison control center or emergency room at once. NOTE: This medicine is only for you. Do not share this medicine with others. What if I miss a dose? It is important not to miss your dose. Call your doctor or health care professional if you are unable to keep an appointment. What may interact with this medicine? Do not take this medicine with any of the following medications: -anakinra -rilonacept This medicine may also interact with the following medications: -vaccines This list may not describe all possible interactions. Give your health care provider  a list of all the medicines, herbs, non-prescription drugs, or dietary supplements you use. Also tell them if you smoke, drink alcohol, or use illegal drugs. Some items may interact with your medicine. What should I watch for while using this medicine? Visit your doctor or health care professional for regular checks on your progress. If you get a cold or other infection while receiving this medicine, call your doctor or health care professional. Do not treat yourself. This medicine may decrease your body's ability to fight infections. Before beginning therapy, your doctor may do a test to see if you have been exposed to tuberculosis. This medicine may make the symptoms of heart failure worse in some patients. If you notice symptoms such as increased shortness of breath or swelling of the ankles or legs, contact your health care provider right away. If you are going to have surgery or dental work, tell your health care professional or dentist that you have received this medicine. If you take this medicine for plaque psoriasis, stay out of the sun. If you cannot avoid being in the sun, wear protective clothing and use sunscreen. Do not use sun lamps or tanning beds/booths. What side effects may I notice from receiving this medicine? Side effects that you should report to your doctor or health care professional as soon as possible: -allergic reactions like skin rash, itching or hives, swelling of the face, lips, or tongue -chest pain -fever or chills, usually related to the infusion -muscle or joint pain -red, scaly patches or raised bumps on the skin -signs of infection - fever or chills, cough, sore throat, pain or difficulty passing urine -swollen lymph nodes in the neck, underarm,  or groin areas -unexplained weight loss -unusual bleeding or bruising -unusually weak or tired -yellowing of the eyes or skin Side effects that usually do not require medical attention (report to your doctor or health  care professional if they continue or are bothersome): -headache -heartburn or stomach pain -nausea, vomiting This list may not describe all possible side effects. Call your doctor for medical advice about side effects. You may report side effects to FDA at 1-800-FDA-1088. Where should I keep my medicine? This drug is given in a hospital or clinic and will not be stored at home. NOTE: This sheet is a summary. It may not cover all possible information. If you have questions about this medicine, talk to your doctor, pharmacist, or health care provider.  2012, [employer]vier/[employer] Standard. (02/25/2008 10:26:02 AM)

## 2011-11-13 ENCOUNTER — Other Ambulatory Visit: Payer: Self-pay

## 2011-11-13 DIAGNOSIS — K5090 Crohn's disease, unspecified, without complications: Secondary | ICD-10-CM

## 2011-11-13 DIAGNOSIS — R197 Diarrhea, unspecified: Secondary | ICD-10-CM

## 2011-11-15 ENCOUNTER — Ambulatory Visit (INDEPENDENT_AMBULATORY_CARE_PROVIDER_SITE_OTHER)
Admission: RE | Admit: 2011-11-15 | Discharge: 2011-11-15 | Disposition: A | Discharge status: Home / Self Care | Payer: Medicare Other | Source: Ambulatory Visit | Attending: Internal Medicine | Admitting: Internal Medicine

## 2011-11-15 DIAGNOSIS — K5090 Crohn's disease, unspecified, without complications: Secondary | ICD-10-CM

## 2011-11-15 DIAGNOSIS — R197 Diarrhea, unspecified: Secondary | ICD-10-CM

## 2011-11-15 MED ORDER — IOHEXOL 300 MG/ML  SOLN
100.0000 mL | Freq: Once | INTRAMUSCULAR | Status: AC | PRN
  Administered 2011-11-15: 100 mL via INTRAVENOUS

## 2011-11-15 NOTE — Progress Notes (Signed)
Quick Note:  Let her know that the small appears to be ok - was checking to see if Crohn's in small intestine also Stay on current tx and keep follow-up as planned for 5/9 Call back sooner prn ______

## 2011-11-23 ENCOUNTER — Telehealth: Payer: Self-pay | Admitting: Internal Medicine

## 2011-11-23 ENCOUNTER — Other Ambulatory Visit: Payer: Self-pay | Admitting: Internal Medicine

## 2011-11-23 MED ORDER — PREDNISONE (PAK) 10 MG PO TABS: 15.0000 mg | ORAL_TABLET | Freq: Every day | ORAL | Status: AC

## 2011-11-23 NOTE — Telephone Encounter (Signed)
Having a normal week - no bleeding, formed stools. Will reduce prednisone from 20 to 15 mg daily. Has f/u next week

## 2011-11-29 ENCOUNTER — Encounter: Payer: Self-pay | Admitting: Internal Medicine

## 2011-11-29 ENCOUNTER — Ambulatory Visit (INDEPENDENT_AMBULATORY_CARE_PROVIDER_SITE_OTHER): Payer: Medicare Other | Admitting: Internal Medicine

## 2011-11-29 VITALS — BP 124/72 | HR 64 | Ht 66.0 in | Wt 169.0 lb

## 2011-11-29 DIAGNOSIS — K5010 Crohn's disease of large intestine without complications: Secondary | ICD-10-CM

## 2011-11-29 DIAGNOSIS — IMO0002 Reserved for concepts with insufficient information to code with codable children: Secondary | ICD-10-CM

## 2011-11-29 DIAGNOSIS — D899 Disorder involving the immune mechanism, unspecified: Secondary | ICD-10-CM

## 2011-11-29 NOTE — Assessment & Plan Note (Signed)
Improved. Given the overall course will increase Remicade to 10 mg per kilogram IV and O2 every 6 weeks.

## 2011-11-29 NOTE — Progress Notes (Signed)
  Subjective:    Patient ID: Anson, female    DOB: 04-26-1941, 71 y.o.   MRN: [id number]  HPI The patient returns for followup of Crohn's colitis, left-sided. She has had a difficult course, unable to completely withdrawal from prednisone. She has had about 5 doses of Remicade, 2 after her initiation. She said she felt better after this last dose. She is at 5 mg per kilogram every 8 weeks. She did get symptomatic about 2 weeks before her dose and steroids were restarted. Currently on 15 mg prednisone daily since May 3.  She is having normal bowel movements, there is scanty red blood in the morning. Some abdominal cramps relieved by dicyclomine. She denies fevers, nausea or vomiting. She is overall very happy with the way she's felt the last couple of weeks, she was able to go to [place] and visit her sister and attend a family event.  She tells me she was on 6 MP when Dr. Oletta Lamas was her gastroenterologist several years ago. She are M. is gone from 50-75 mg dose but it did not work.  Medications, allergies, past medical history, past surgical history, family history and social history are reviewed and updated in the EMR.  Review of Systems She's had some easy bruising on the skin of the forearms and the tape that her infusion for her skin.    Objective:   Physical Exam General:  NAD Eyes:   anicteric Lungs:  clear Heart:  S1S2 no rubs, murmurs or gallops Abdomen:  soft and nontender, BS+ Ext:   no edema Skin:  Forearm show some purpura and thinning of the skin. Right forearm has a healing wound with fresh R. where the tape pulled her skin off there is a surrounding bruise where her dog jumped on her.    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 Crohn's colitis - left -sided   Improved. We'll see if she can taper off the steroids over time. The plan will be to go to 10 mg daily in about 10 days. Then in 2 more weeks go to 5 mg daily.  She is to call me after her next Remicade infusion with an update will determine whether she can, off completely if she is doing okay. I am changing her Remicade to 10 mg per kilogram IV every 6 weeks. About the possibility of adding 6-MP she prefers not to do that at this time. I will see her back routinely in about 3 months.   2. Long-term current use of steroids   Bone densitometry study will be undertaken because of this an increased risk of osteoporosis.

## 2011-11-29 NOTE — Patient Instructions (Signed)
On 12/08/11 take Prednisone 9m every day, then 12/22/11 cut the Prednisone to 575mevery day.  Call [facility] your next Remicade to discuss your Prednisone dosing.  You have been set up for a bone density test downstairs in our radiology dept. For 12/05/11 at 11:00am.  Follow-up with an appointment with [facility] 3 months.

## 2011-11-30 ENCOUNTER — Other Ambulatory Visit: Payer: Self-pay

## 2011-11-30 DIAGNOSIS — K5090 Crohn's disease, unspecified, without complications: Secondary | ICD-10-CM

## 2011-11-30 MED ORDER — INFLIXIMAB 100 MG IV SOLR: INTRAVENOUS | Status: DC

## 2011-11-30 MED ORDER — SODIUM CHLORIDE 0.9 % IV SOLN: 5.0000 mg/kg | INTRAVENOUS | Status: DC

## 2011-12-04 ENCOUNTER — Encounter: Payer: Self-pay | Admitting: Internal Medicine

## 2011-12-05 ENCOUNTER — Ambulatory Visit (INDEPENDENT_AMBULATORY_CARE_PROVIDER_SITE_OTHER)
Admission: RE | Admit: 2011-12-05 | Discharge: 2011-12-05 | Disposition: A | Discharge status: Home / Self Care | Payer: Medicare Other | Source: Ambulatory Visit | Attending: Internal Medicine | Admitting: Internal Medicine

## 2011-12-05 DIAGNOSIS — IMO0002 Reserved for concepts with insufficient information to code with codable children: Secondary | ICD-10-CM

## 2011-12-05 DIAGNOSIS — M899 Disorder of bone, unspecified: Secondary | ICD-10-CM

## 2011-12-25 ENCOUNTER — Encounter (HOSPITAL_COMMUNITY): Payer: Self-pay

## 2011-12-25 ENCOUNTER — Encounter: Payer: Self-pay | Admitting: Internal Medicine

## 2011-12-25 ENCOUNTER — Encounter (HOSPITAL_COMMUNITY)
Admission: RE | Admit: 2011-12-25 | Discharge: 2011-12-25 | Disposition: A | Discharge status: Home / Self Care | Payer: Medicare Other | Source: Ambulatory Visit | Attending: Internal Medicine | Admitting: Internal Medicine

## 2011-12-25 VITALS — BP 144/81 | HR 60 | Temp 97.4°F | Resp 18 | Wt 168.5 lb

## 2011-12-25 DIAGNOSIS — T50905A Adverse effect of unspecified drugs, medicaments and biological substances, initial encounter: Secondary | ICD-10-CM | POA: Insufficient documentation

## 2011-12-25 DIAGNOSIS — K5090 Crohn's disease, unspecified, without complications: Secondary | ICD-10-CM

## 2011-12-25 DIAGNOSIS — M8580 Other specified disorders of bone density and structure, unspecified site: Secondary | ICD-10-CM | POA: Insufficient documentation

## 2011-12-25 MED ORDER — DIPHENHYDRAMINE HCL 25 MG PO CAPS
50.0000 mg | ORAL_CAPSULE | Freq: Once | ORAL | Status: AC
  Administered 2011-12-25: 11:00:00 via ORAL

## 2011-12-25 MED ORDER — DIPHENHYDRAMINE HCL 25 MG PO CAPS
ORAL_CAPSULE | ORAL | Status: AC
  Filled 2011-12-25: qty 2

## 2011-12-25 MED ORDER — ACETAMINOPHEN 325 MG PO TABS
650.0000 mg | ORAL_TABLET | Freq: Once | ORAL | Status: AC
  Administered 2011-12-25: 325 mg via ORAL

## 2011-12-25 MED ORDER — ACETAMINOPHEN 325 MG PO TABS
ORAL_TABLET | ORAL | Status: AC
  Administered 2011-12-25: 325 mg via ORAL
  Filled 2011-12-25: qty 2

## 2011-12-25 MED ORDER — SODIUM CHLORIDE 0.9 % IV SOLN
10.0000 mg/kg | INTRAVENOUS | Status: DC
  Administered 2011-12-25: 800 mg via INTRAVENOUS
  Filled 2011-12-25: qty 80

## 2011-12-25 MED ORDER — SODIUM CHLORIDE 0.9 % IV SOLN
INTRAVENOUS | Status: AC
  Administered 2011-12-25: 11:00:00 via INTRAVENOUS

## 2011-12-25 NOTE — Progress Notes (Signed)
Quick Note:  Let her know that bones are thin some - osteopenia which is not as bad as osteoporosis.  Stay on calcium and vitamin D and we will talk about possible other therapy to reduce fractures in August (she needs to make an August follow-up, sooner if having problems)   ______

## 2011-12-25 NOTE — Discharge Instructions (Signed)
Infliximab injection What is this medicine? INFLIXIMAB (in [place] i mab) is used to treat Crohn's disease and ulcerative colitis. It is also used to treat ankylosing spondylitis, psoriasis, and some forms of arthritis. This medicine may be used for other purposes; ask your health care provider or pharmacist if you have questions. What should I tell my health care provider before I take this medicine? They need to know if you have any of these conditions: -diabetes -exposure to tuberculosis -heart failure -hepatitis or liver disease -immune system problems -infection -lung or breathing disease, like COPD -multiple sclerosis -current or past resident of [place] or [place] -seizure disorder -an unusual or allergic reaction to infliximab, mouse proteins, other medicines, foods, dyes, or preservatives -pregnant or trying to get pregnant -breast-feeding How should I use this medicine? This medicine is for injection into a vein. It is usually given by a health care professional in a hospital or clinic setting. A special MedGuide will be given to you by the pharmacist with each prescription and refill. Be sure to read this information carefully each time. Talk to your pediatrician regarding the use of this medicine in children. Special care may be needed. Overdosage: If you think you have taken too much of this medicine contact a poison control center or emergency room at once. NOTE: This medicine is only for you. Do not share this medicine with others. What if I miss a dose? It is important not to miss your dose. Call your doctor or health care professional if you are unable to keep an appointment. What may interact with this medicine? Do not take this medicine with any of the following medications: -anakinra -rilonacept This medicine may also interact with the following medications: -vaccines This list may not describe all possible interactions. Give your health care provider  a list of all the medicines, herbs, non-prescription drugs, or dietary supplements you use. Also tell them if you smoke, drink alcohol, or use illegal drugs. Some items may interact with your medicine. What should I watch for while using this medicine? Visit your doctor or health care professional for regular checks on your progress. If you get a cold or other infection while receiving this medicine, call your doctor or health care professional. Do not treat yourself. This medicine may decrease your body's ability to fight infections. Before beginning therapy, your doctor may do a test to see if you have been exposed to tuberculosis. This medicine may make the symptoms of heart failure worse in some patients. If you notice symptoms such as increased shortness of breath or swelling of the ankles or legs, contact your health care provider right away. If you are going to have surgery or dental work, tell your health care professional or dentist that you have received this medicine. If you take this medicine for plaque psoriasis, stay out of the sun. If you cannot avoid being in the sun, wear protective clothing and use sunscreen. Do not use sun lamps or tanning beds/booths. What side effects may I notice from receiving this medicine? Side effects that you should report to your doctor or health care professional as soon as possible: -allergic reactions like skin rash, itching or hives, swelling of the face, lips, or tongue -chest pain -fever or chills, usually related to the infusion -muscle or joint pain -red, scaly patches or raised bumps on the skin -signs of infection - fever or chills, cough, sore throat, pain or difficulty passing urine -swollen lymph nodes in the neck, underarm,  or groin areas -unexplained weight loss -unusual bleeding or bruising -unusually weak or tired -yellowing of the eyes or skin Side effects that usually do not require medical attention (report to your doctor or health  care professional if they continue or are bothersome): -headache -heartburn or stomach pain -nausea, vomiting This list may not describe all possible side effects. Call your doctor for medical advice about side effects. You may report side effects to FDA at 1-800-FDA-1088. Where should I keep my medicine? This drug is given in a hospital or clinic and will not be stored at home. NOTE: This sheet is a summary. It may not cover all possible information. If you have questions about this medicine, talk to your doctor, pharmacist, or health care provider.  2012, Elsevier/Gold Standard. (02/25/2008 10:26:02 AM)

## 2012-01-07 ENCOUNTER — Encounter (HOSPITAL_COMMUNITY): Payer: Medicare Other

## 2012-01-14 ENCOUNTER — Telehealth: Payer: Self-pay | Admitting: Internal Medicine

## 2012-01-14 NOTE — Telephone Encounter (Signed)
Patient reports she is having rectal bleeding that started when she decreased to 10 mg of prednisone.  She stopped the prednisone as advised and her last dose was last week.  She is c/o severe lower abdominal cramping, she is having formed stools, and is having a lot of lower joint and back pain. Her next remicade infusion is scheduled for 02/05/12.  Dr. Carlean Purl please advise

## 2012-01-15 ENCOUNTER — Ambulatory Visit (AMBULATORY_SURGERY_CENTER): Payer: Medicare Other

## 2012-01-15 VITALS — Ht 66.0 in | Wt 169.3 lb

## 2012-01-15 DIAGNOSIS — R109 Unspecified abdominal pain: Secondary | ICD-10-CM

## 2012-01-15 DIAGNOSIS — K5090 Crohn's disease, unspecified, without complications: Secondary | ICD-10-CM

## 2012-01-15 DIAGNOSIS — K50911 Crohn's disease, unspecified, with rectal bleeding: Secondary | ICD-10-CM

## 2012-01-15 NOTE — Telephone Encounter (Signed)
I would like her to have a flex sig - moderate sedation - this week please

## 2012-01-15 NOTE — Telephone Encounter (Signed)
Patient advised.  She will come in for a pre-visit today and flex in [facility] 01/17/12

## 2012-01-17 ENCOUNTER — Ambulatory Visit (AMBULATORY_SURGERY_CENTER): Payer: Medicare Other | Admitting: Internal Medicine

## 2012-01-17 ENCOUNTER — Encounter: Payer: Self-pay | Admitting: Internal Medicine

## 2012-01-17 VITALS — BP 134/79 | HR 95 | Temp 98.2°F | Resp 19 | Ht 66.0 in | Wt 169.0 lb

## 2012-01-17 DIAGNOSIS — K5090 Crohn's disease, unspecified, without complications: Secondary | ICD-10-CM

## 2012-01-17 DIAGNOSIS — R109 Unspecified abdominal pain: Secondary | ICD-10-CM

## 2012-01-17 HISTORY — PX: OTHER SURGICAL HISTORY: SHX169

## 2012-01-17 MED ORDER — PREDNISONE (PAK) 10 MG PO TABS: 40.0000 mg | ORAL_TABLET | Freq: Every day | ORAL | Status: AC

## 2012-01-17 MED ORDER — SODIUM CHLORIDE 0.9 % IV SOLN: 500.0000 mL | INTRAVENOUS | Status: DC

## 2012-01-17 MED ORDER — MERCAPTOPURINE 50 MG PO TABS: 100.0000 mg | ORAL_TABLET | Freq: Every day | ORAL | Status: AC

## 2012-01-17 NOTE — Patient Instructions (Addendum)
YOU HAD AN ENDOSCOPIC PROCEDURE TODAY AT [facility]: Refer to the procedure report that was given to you for any specific questions about what was found during the examination.  If the procedure report does not answer your questions, please call your gastroenterologist to clarify.  If you requested that your care partner not be given the details of your procedure findings, then the procedure report has been included in a sealed envelope for you to review at your convenience later.  YOU SHOULD EXPECT: Some feelings of bloating in the abdomen. Passage of more gas than usual.  Walking can help get rid of the air that was put into your GI tract during the procedure and reduce the bloating. If you had a lower endoscopy (such as a colonoscopy or flexible sigmoidoscopy) you may notice spotting of blood in your stool or on the toilet paper. If you underwent a bowel prep for your procedure, then you may not have a normal bowel movement for a few days.  DIET: Your first meal following the procedure should be a light meal and then it is ok to progress to your normal diet.  A half-sandwich or bowl of soup is an example of a good first meal.  Heavy or fried foods are harder to digest and may make you feel nauseous or bloated.  Likewise meals heavy in dairy and vegetables can cause extra gas to form and this can also increase the bloating.  Drink plenty of fluids but you should avoid alcoholic beverages for 24 hours.  ACTIVITY: Your care partner should take you home directly after the procedure.  You should plan to take it easy, moving slowly for the rest of the day.  You can resume normal activity the day after the procedure however you should NOT DRIVE or use heavy machinery for 24 hours (because of the sedation medicines used during the test).    SYMPTOMS TO REPORT IMMEDIATELY: A gastroenterologist can be reached at any hour.  During normal business hours, 8:30 AM to 5:00 PM Monday through Friday,  call [phone].  After hours and on weekends, please call the GI answering service at [phone] who will take a message and have the physician on call contact you.   Following lower endoscopy (colonoscopy or flexible sigmoidoscopy):  Excessive amounts of blood in the stool  Significant tenderness or worsening of abdominal pains  Swelling of the abdomen that is new, acute  Fever of 100F or higher  Following upper endoscopy (EGD)  Vomiting of blood or coffee ground material  New chest pain or pain under the shoulder blades  Painful or persistently difficult swallowing  New shortness of breath  Fever of 100F or higher  Black, tarry-looking stools  FOLLOW UP: If any biopsies were taken you will be contacted by phone or by letter within the next 1-3 weeks.  Call your gastroenterologist if you have not heard about the biopsies in 3 weeks.  Our staff will call the home number listed on your records the next business day following your procedure to check on you and address any questions or concerns that you may have at that time regarding the information given to you following your procedure. This is a courtesy call and so if there is no answer at the home number and we have not heard from you through the emergency physician on call, we will assume that you have returned to your regular daily activities without incident.  SIGNATURES/CONFIDENTIALITY: You and/or your care  partner have signed paperwork which will be entered into your electronic medical record.  These signatures attest to the fact that that the information above on your After Visit Summary has been reviewed and is understood.  Full responsibility of the confidentiality of this discharge information lies with you and/or your care-partner.    Start prednisone 74m. Daily.  Start 6 MP 1075m Daily (was 50-7561m CBC and LFT in one week, office will arrange.  Office visit in one month, office will arrange.

## 2012-01-17 NOTE — Progress Notes (Signed)
Patient did not experience any of the following events: a burn prior to discharge; a fall within the facility; wrong site/side/patient/procedure/implant event; or a hospital transfer or hospital admission upon discharge from the facility. [phone]) Patient did not have preoperative order for IV antibiotic SSI prophylaxis. [phone]) Patient did not have preoperative order for IV antibiotic SSI prophylaxis. [phone])

## 2012-01-17 NOTE — Progress Notes (Signed)
The pt tolerated the flex sig very well. Maw

## 2012-01-17 NOTE — Op Note (Signed)
[facility] [address] [place], [place]  [id number]  FLEXIBLE SIGMOIDOSCOPY PROCEDURE REPORT  PATIENT:  [name] [name], [name] [name]  MR#:  [id number] BIRTHDATE:  May 27, 1941, 24 yrs. old  GENDER:  female  ENDOSCOPIST:  [name], MD, [facility]  PROCEDURE DATE:  01/17/2012 PROCEDURE:  Flexible Sigmoidoscopy with biopsy ASA CLASS:  Class II INDICATIONS:  diarrhea, hematochezia, crohn's disease  MEDICATIONS:   These medications were titrated to patient response per physician's verbal order, Fentanyl 50 mcg, Versed 4 mg IV  DESCRIPTION OF PROCEDURE:   After the risks benefits and alternatives of the procedure were thoroughly explained, informed consent was obtained.  Digital rectal exam was performed and revealed no abnormalities.   The [id number] endoscope was introduced through the anus and advanced to the sigmoid colon, without limitations.  The quality of the prep was excellent.  The instrument was then slowly withdrawn as the mucosa was fully examined. <<PROCEDUREIMAGES>>  Colitis was found rectum to sigmoid erythema, edema and friable mucosa with mucoid exudate - confluent. Multiple biopsies were obtained and sent to pathology.   Retroflexed views in the rectum revealed not performed.    The scope was then withdrawn from the patient and the procedure terminated.  COMPLICATIONS:  None  ENDOSCOPIC IMPRESSION: 1) Colitis in the rectum to sigmoid - looks like Crohn's exacerbation RECOMMENDATIONS: 1) start prednisone 40 mg daily 2) start 6 MP 100 mg daily (in past was on 50 and 75 mg) 3) CBC and LFT's in 1 week - office will arrange 4) Office visit 1 month - office will arrange  [name], MD, [name]  CC:  The Patient  n. eSIGNED:   [name] at 01/17/2012 02:14 PM  [name] [name], [name] [name], [phone]

## 2012-01-18 ENCOUNTER — Telehealth: Payer: Self-pay | Admitting: *Deleted

## 2012-01-18 NOTE — Progress Notes (Signed)
Quick Note:  Letter not needed and no recall Will notify with lab testing next week ______

## 2012-01-18 NOTE — Telephone Encounter (Signed)
  Follow up Call-  Call back number 01/17/2012 09/27/2011  Post procedure Call Back phone  # [phone]  Permission to leave phone message Yes Yes     Patient questions:  Do you have a fever, pain , or abdominal swelling? no Pain Score  0 *  Have you tolerated food without any problems? yes  Have you been able to return to your normal activities? yes  Do you have any questions about your discharge instructions: Diet   no Medications  no Follow up visit  no  Do you have questions or concerns about your Care? no  Actions: * If pain score is 4 or above: No action needed, pain <4.

## 2012-02-05 ENCOUNTER — Encounter (HOSPITAL_COMMUNITY)
Admission: RE | Admit: 2012-02-05 | Discharge: 2012-02-05 | Disposition: A | Discharge status: Home / Self Care | Payer: Medicare Other | Source: Ambulatory Visit | Attending: Internal Medicine | Admitting: Internal Medicine

## 2012-02-05 ENCOUNTER — Encounter (HOSPITAL_COMMUNITY): Payer: Self-pay

## 2012-02-05 VITALS — BP 130/70 | HR 64 | Temp 98.0°F | Resp 18 | Ht 66.0 in | Wt 165.0 lb

## 2012-02-05 DIAGNOSIS — K5090 Crohn's disease, unspecified, without complications: Secondary | ICD-10-CM | POA: Insufficient documentation

## 2012-02-05 MED ORDER — DIPHENHYDRAMINE HCL 25 MG PO CAPS
ORAL_CAPSULE | ORAL | Status: AC
  Filled 2012-02-05: qty 2

## 2012-02-05 MED ORDER — SODIUM CHLORIDE 0.9 % IV SOLN
INTRAVENOUS | Status: AC
  Administered 2012-02-05: 11:00:00 via INTRAVENOUS

## 2012-02-05 MED ORDER — SODIUM CHLORIDE 0.9 % IV SOLN
10.0000 mg/kg | INTRAVENOUS | Status: DC
  Administered 2012-02-05: 800 mg via INTRAVENOUS
  Filled 2012-02-05: qty 80

## 2012-02-05 MED ORDER — DIPHENHYDRAMINE HCL 25 MG PO CAPS
50.0000 mg | ORAL_CAPSULE | Freq: Once | ORAL | Status: AC
  Administered 2012-02-05: 50 mg via ORAL

## 2012-02-05 MED ORDER — ACETAMINOPHEN 325 MG PO TABS
650.0000 mg | ORAL_TABLET | Freq: Once | ORAL | Status: AC
  Administered 2012-02-05: 650 mg via ORAL

## 2012-02-05 MED ORDER — ACETAMINOPHEN 325 MG PO TABS
ORAL_TABLET | ORAL | Status: AC
  Filled 2012-02-05: qty 2

## 2012-02-13 ENCOUNTER — Telehealth: Payer: Self-pay | Admitting: Internal Medicine

## 2012-02-13 ENCOUNTER — Other Ambulatory Visit (INDEPENDENT_AMBULATORY_CARE_PROVIDER_SITE_OTHER): Payer: Medicare Other

## 2012-02-13 DIAGNOSIS — K5010 Crohn's disease of large intestine without complications: Secondary | ICD-10-CM

## 2012-02-13 LAB — CBC WITH DIFFERENTIAL/PLATELET
Eosinophils Absolute: 0 10*3/uL (ref 0.0–0.7)
Eosinophils Relative: 0 % (ref 0.0–5.0)
Lymphocytes Relative: 8.7 % — ABNORMAL LOW (ref 12.0–46.0)
MCHC: 32.2 g/dL (ref 30.0–36.0)
MCV: 98.5 fl (ref 78.0–100.0)
Monocytes Absolute: 0 10*3/uL — ABNORMAL LOW (ref 0.1–1.0)
Neutrophils Relative %: 90.7 % — ABNORMAL HIGH (ref 43.0–77.0)
Platelets: 558 10*3/uL — ABNORMAL HIGH (ref 150.0–400.0)
WBC: 8.1 10*3/uL (ref 4.5–10.5)

## 2012-02-13 LAB — HEPATIC FUNCTION PANEL
Alkaline Phosphatase: 33 U/L — ABNORMAL LOW (ref 39–117)
Bilirubin, Direct: 0.1 mg/dL (ref 0.0–0.3)
Total Bilirubin: 0.8 mg/dL (ref 0.3–1.2)

## 2012-02-13 NOTE — Telephone Encounter (Signed)
Patient has been on Prednisone 40 mg daily for several weeks. She is wired up and would like to decrease it. She is not bleeding now. She is taking the 6 MP. She has noticed her feet tingling, legs are numb and joints hurt. Please, advise if she can decrease Prednisone.

## 2012-02-13 NOTE — Telephone Encounter (Signed)
Patient notified of Dr. Celesta Aver recommendations. She will come for labs and keep her appointment

## 2012-02-13 NOTE — Telephone Encounter (Signed)
1) reduce prednisone to 30 mg daily x 1 week 2) in 1 week go to 20 mg daily prednisone 3) needs CBC and LFT's now that she is on 6MP and may give 1 refill on that please 4) due to see me 8/6

## 2012-02-13 NOTE — Telephone Encounter (Signed)
Patient is on Prednisone 40 mgs. For the past couple of weeks.  Wants to know if the dose can lowered.  The medicine is making her very "wired" and "tense".

## 2012-02-15 NOTE — Progress Notes (Signed)
Quick Note:  Labs are ok Please let her know ______

## 2012-02-26 ENCOUNTER — Ambulatory Visit (INDEPENDENT_AMBULATORY_CARE_PROVIDER_SITE_OTHER): Payer: Medicare Other | Admitting: Internal Medicine

## 2012-02-26 ENCOUNTER — Encounter: Payer: Self-pay | Admitting: Internal Medicine

## 2012-02-26 VITALS — BP 162/82 | HR 72 | Ht 64.75 in | Wt 172.5 lb

## 2012-02-26 DIAGNOSIS — F411 Generalized anxiety disorder: Secondary | ICD-10-CM

## 2012-02-26 DIAGNOSIS — T887XXA Unspecified adverse effect of drug or medicament, initial encounter: Secondary | ICD-10-CM

## 2012-02-26 DIAGNOSIS — F419 Anxiety disorder, unspecified: Secondary | ICD-10-CM

## 2012-02-26 DIAGNOSIS — K5010 Crohn's disease of large intestine without complications: Secondary | ICD-10-CM

## 2012-02-26 MED ORDER — CLONAZEPAM 0.25 MG PO TBDP: 0.2500 mg | ORAL_TABLET | Freq: Two times a day (BID) | ORAL | Status: DC | PRN

## 2012-02-26 MED ORDER — PREDNISONE 10 MG PO TABS: ORAL_TABLET | ORAL | Status: AC

## 2012-02-26 NOTE — Patient Instructions (Addendum)
Take 1 1/12 tablets of prednisone 915 mg) starting 8/7 On 8/12 take 1 tablet (10 mg) daily On 8/19 take 1/2 tablet prednisone daily (88m) On 8/26 take 1/2 tablet (550m every other day On 9/1 take last dose prednisone  We are going to fax a Rx for Klonopin to your pharmacy to help you sleep while on the prednisone.  We are going to check and see what drug would be less expensive than the 6MP and we will be in touch.  Follow-up with usKorean 6 weeks.  Thank you for choosing [facility] and [facility].  CaGatha MayerM.D., [facility]

## 2012-02-26 NOTE — Assessment & Plan Note (Addendum)
Better now on 6MP, Remicade and prednisone. Taper prednisone and see how she does. See if azathioprine is less costly.

## 2012-02-26 NOTE — Progress Notes (Signed)
  Subjective:    Patient ID: [place], female    DOB: [birth year], 71 y.o.   MRN: [id number]  HPI The patient returns with her son. She is not having diarrhea or rectal bleeding on prednisone, 6MP and Remicade. She is not sleeping well and is anxious and jittery. Her prednisone has been reduced to 20 mg daily but she still has these problems. She has gained weight and feels bloated and puffy.  Medications, allergies, past medical history, past surgical history, family history and social history are reviewed and updated in the EMR.  Review of Systems As above    Objective:   Physical Exam General:  NAD, slightly cushingoid Eyes:   anicteric Lungs:  clear Abdomen:  soft and nontender, BS+ Ext:   no edema       Assessment & Plan:   1. Crohn's colitis - left -sided   2. Medication side effect   3. Anxiety    She has agitation, anxiety and side effects from prednisone. Her Crohn's colitis is controlled. Note that she has been unable to go below 20 to 10 mg prednisone in past so the 6 MP has been added to Remicade which is at 10 mg/kg every 6 weeks.  1. Taper prednisone to off over next month 2. 6 MP is expensive, will see if using azathioprine would be cheaper 3. Clonazepam 0.25 mg 1-2 prn tid and use a bedtime if needed 4. See me in 6-8 weeks 5. May need anti-TNF ab levels, etc if flares when she tapers again

## 2012-02-29 ENCOUNTER — Telehealth: Payer: Self-pay

## 2012-02-29 ENCOUNTER — Other Ambulatory Visit: Payer: Self-pay | Admitting: Internal Medicine

## 2012-02-29 MED ORDER — AZATHIOPRINE 50 MG PO TABS: 150.0000 mg | ORAL_TABLET | Freq: Every day | ORAL | Status: DC

## 2012-02-29 NOTE — Telephone Encounter (Signed)
Contacted pt and let her know that Dr. Carlean Purl sent Rx to [facility] for the Azathioprine that is only $ 41.00 per month according to Suanne Marker at [facility] with pts insurance.  She will switch to this once her 6MP runs out.  She has 19 days left of that.  It is too expensive to continue, thus the change.  Pt also let me know that the Klonopin is helping her to sleep and she feels better because of the rest.  She was appreciative of our help in this matter.

## 2012-03-03 ENCOUNTER — Encounter (HOSPITAL_COMMUNITY): Payer: Medicare Other

## 2012-03-12 ENCOUNTER — Other Ambulatory Visit: Payer: Self-pay

## 2012-03-12 DIAGNOSIS — K5090 Crohn's disease, unspecified, without complications: Secondary | ICD-10-CM

## 2012-03-14 ENCOUNTER — Encounter: Payer: Self-pay | Admitting: Internal Medicine

## 2012-03-18 ENCOUNTER — Encounter (HOSPITAL_COMMUNITY)
Admission: RE | Admit: 2012-03-18 | Discharge: 2012-03-18 | Disposition: A | Discharge status: Home / Self Care | Payer: Medicare Other | Source: Ambulatory Visit | Attending: Internal Medicine | Admitting: Internal Medicine

## 2012-03-18 ENCOUNTER — Encounter (HOSPITAL_COMMUNITY): Payer: Self-pay

## 2012-03-18 VITALS — BP 178/80 | HR 55 | Temp 97.2°F | Resp 16 | Wt 172.4 lb

## 2012-03-18 DIAGNOSIS — K5090 Crohn's disease, unspecified, without complications: Secondary | ICD-10-CM

## 2012-03-18 MED ORDER — DIPHENHYDRAMINE HCL 25 MG PO CAPS
50.0000 mg | ORAL_CAPSULE | ORAL | Status: DC
Start: 2012-03-18 — End: 2012-03-19
  Administered 2012-03-18: 50 mg via ORAL
  Filled 2012-03-18: qty 2

## 2012-03-18 MED ORDER — SODIUM CHLORIDE 0.9 % IV SOLN
INTRAVENOUS | Status: DC
  Administered 2012-03-18: 11:00:00 via INTRAVENOUS

## 2012-03-18 MED ORDER — ACETAMINOPHEN 325 MG PO TABS
650.0000 mg | ORAL_TABLET | ORAL | Status: DC
  Administered 2012-03-18: 650 mg via ORAL
  Filled 2012-03-18: qty 2

## 2012-03-18 MED ORDER — SODIUM CHLORIDE 0.9 % IV SOLN
10.0000 mg/kg | INTRAVENOUS | Status: DC
  Administered 2012-03-18: 800 mg via INTRAVENOUS
  Filled 2012-03-18: qty 80

## 2012-03-18 NOTE — Progress Notes (Signed)
Unsuccessful IV attempt to rt forearm remains puffy, no bruising; Ice applied

## 2012-04-08 ENCOUNTER — Ambulatory Visit: Payer: Medicare Other | Admitting: Internal Medicine

## 2012-04-11 ENCOUNTER — Other Ambulatory Visit (INDEPENDENT_AMBULATORY_CARE_PROVIDER_SITE_OTHER): Payer: Medicare Other

## 2012-04-11 ENCOUNTER — Ambulatory Visit (INDEPENDENT_AMBULATORY_CARE_PROVIDER_SITE_OTHER): Payer: Medicare Other | Admitting: Internal Medicine

## 2012-04-11 ENCOUNTER — Encounter: Payer: Self-pay | Admitting: Internal Medicine

## 2012-04-11 VITALS — BP 100/68 | HR 68 | Ht 66.0 in | Wt 174.0 lb

## 2012-04-11 DIAGNOSIS — K5010 Crohn's disease of large intestine without complications: Secondary | ICD-10-CM

## 2012-04-11 DIAGNOSIS — D899 Disorder involving the immune mechanism, unspecified: Secondary | ICD-10-CM

## 2012-04-11 LAB — CBC WITH DIFFERENTIAL/PLATELET
Basophils Relative: 0.3 % (ref 0.0–3.0)
Eosinophils Absolute: 0.1 10*3/uL (ref 0.0–0.7)
Hemoglobin: 13.7 g/dL (ref 12.0–15.0)
Lymphs Abs: 1.7 10*3/uL (ref 0.7–4.0)
MCHC: 34.1 g/dL (ref 30.0–36.0)
MCV: 105.1 fl — ABNORMAL HIGH (ref 78.0–100.0)
Monocytes Absolute: 0.6 10*3/uL (ref 0.1–1.0)
Neutro Abs: 2.4 10*3/uL (ref 1.4–7.7)
RBC: 3.83 Mil/uL — ABNORMAL LOW (ref 3.87–5.11)

## 2012-04-11 LAB — HEPATIC FUNCTION PANEL
Albumin: 3.9 g/dL (ref 3.5–5.2)
Alkaline Phosphatase: 44 U/L (ref 39–117)
Total Protein: 7 g/dL (ref 6.0–8.3)

## 2012-04-11 NOTE — Assessment & Plan Note (Signed)
Appears to be in clinical remission, now off prednisone x3 weeks, on Remicade 10 mg per kilogram every 6 weeks and azathioprine 150 mg daily

## 2012-04-11 NOTE — Progress Notes (Signed)
Subjective:    Patient ID: [place], female    DOB: 1941-02-18, 71 y.o.   MRN: [id number]  HPI She is here for followup of left-sided Crohn's colitis on Imuran and Remicade. Last seen she was advised to taper off her prednisone, I last saw her in July. Patient returns doing as well as she has since I met her. No diarrhea no bleeding. Occasional crampy abdominal pain on the right side not bothersome. The last time she had some abdominal discomfort was after eating colored creams. She is in much better spirits, she is sleeping well now that she is off prednisone and does not need clonazepam at all. Allergies  Allergen Reactions  . Adhesive (Tape) Rash    Taking skin off  . Sulfa Antibiotics Itching and Rash   Outpatient Prescriptions Prior to Visit  Medication Sig Dispense Refill  . azaTHIOprine (IMURAN) 50 MG tablet Take 3 tablets (150 mg total) by mouth daily.  90 tablet  3  . b complex vitamins capsule Take 1 capsule by mouth daily.        . Calcium Carbonate-Vit D-Min (CALCIUM 1200) 1200-1000 MG-UNIT CHEW Take 1 chew by mouth once daily  1 each  0  . cetirizine (ZYRTEC) 10 MG tablet Take 10 mg by mouth daily as needed. allergies      . Cholecalciferol (VITAMIN D) 2000 UNITS CAPS Take 1 capsule (2,000 Units total) by mouth daily.  1 capsule  0  . cholestyramine (QUESTRAN) 4 GM/DOSE powder Take 4 g by mouth 2 (two) times daily as needed.       . dicyclomine (BENTYL) 20 MG tablet Take 20 mg by mouth 3 (three) times daily. For stomach pain      . inFLIXimab (REMICADE) 100 MG injection Infuse 10 mg/Kg q 6 weeks  1 each  3  . losartan-hydrochlorothiazide (HYZAAR) 100-12.5 MG per tablet Take 1 tablet by mouth daily.        . Multiple Vitamins-Minerals (MULTIVITAMINS THER. W/MINERALS) TABS Take 1 tablet by mouth daily.        . Omega-3 Fatty Acids (FISH OIL) 1000 MG CAPS Take 1,000 mg by mouth daily.        . pantoprazole (PROTONIX) 40 MG tablet Take 1 tablet (40 mg total) by mouth daily.   30 tablet  11  . traMADol (ULTRAM) 50 MG tablet Take 50 mg by mouth 2 (two) times daily as needed. For pain. Maximum dose= 8 tablets per day      . clonazePAM (KLONOPIN) 0.25 MG disintegrating tablet Take 1 tablet (0.25 mg total) by mouth 2 (two) times daily as needed (may use for sleep and may take 2 if 1 ineffective).  90 tablet  0   Past Medical History  Diagnosis Date  . Crohn's colitis   . Hypertension   . Colon polyps     adenomatous polyp June 2012  . Complication of anesthesia     slow to wake  . Shortness of breath   . Blood transfusion     1966  . Headache     hx migraines  . Arthritis     shoulder  . Bell's palsy   . GERD (gastroesophageal reflux disease)    Past Surgical History  Procedure Date  . Cholecystectomy   . Abdominal hysterectomy   . Tonsillectomy   . Shoulder surgery     right  . Foot ganglion excision   . Appendectomy   . Flexible sigmoidoscopy 06/08/2011  severe Crohn's colitis to descending colon at least  . Colonoscopy multiple  . Flex sigmoidoscopy with biospy 01/17/12      Review of Systems She reports no other problems at this time except for some bruising or purpura changes in the forearms    Objective:   Physical Exam General:  NAD Eyes:   anicteric Lungs:  clear Heart:  S1S2 no rubs, murmurs or gallops Abdomen:  soft and nontender, BS+ Ext:   no edema  Lab Results  Component Value Date   WBC 8.1 02/13/2012   HGB 14.5 02/13/2012   HCT 45.0 02/13/2012   MCV 98.5 02/13/2012   PLT 558.0* 02/13/2012     Chemistry      Component Value Date/Time   NA 140 11/12/2011 0807   K 3.4* 11/12/2011 0807   CL 106 11/12/2011 0807   CO2 26 11/12/2011 0807   BUN 10 11/12/2011 0807   CREATININE 0.8 11/12/2011 0807      Component Value Date/Time   CALCIUM 8.6 11/12/2011 0807   ALKPHOS 33* 02/13/2012 1605   AST 22 02/13/2012 1605   ALT 24 02/13/2012 1605   BILITOT 0.8 02/13/2012 1605         Assessment & Plan:   1. Crohn's colitis -  left-sided   2. Immunosuppression-Remicade and azathioprine   1. She appears to be in clinical remission at this time. Now off prednisone without problems. 2. Will check CBC and liver function tests today as part of routine followup on the azathioprine. 3. Continue Remicade 10 mg every 6 weeks and azathioprine one 50 mg daily. 4. Return routinely in 6 months, sooner if needed.  CC: [name], NP

## 2012-04-11 NOTE — Patient Instructions (Addendum)
Your physician has requested that you go to the basement for the following lab work before leaving today:  CBC, Hepatic function  Please follow up with Dr. Carlean Purl in 6 months.

## 2012-04-13 NOTE — Progress Notes (Signed)
Quick Note:  Labs are normal or unchanged and acceptable. Repeat CBC and hepatic function panel in 3 months as usual. Please notify patient. MCV increase due to azathioprine, I think.  Please send a copy of these labs and note to her PCP ______

## 2012-04-14 ENCOUNTER — Other Ambulatory Visit: Payer: Self-pay

## 2012-04-14 DIAGNOSIS — K5010 Crohn's disease of large intestine without complications: Secondary | ICD-10-CM

## 2012-04-28 ENCOUNTER — Telehealth: Payer: Self-pay | Admitting: Internal Medicine

## 2012-04-28 DIAGNOSIS — R110 Nausea: Secondary | ICD-10-CM

## 2012-04-28 MED ORDER — PANTOPRAZOLE SODIUM 40 MG PO TBEC: 40.0000 mg | DELAYED_RELEASE_TABLET | Freq: Every day | ORAL | Status: DC

## 2012-04-28 NOTE — Telephone Encounter (Signed)
Patient reports that she has had a week of nausea.  She recently traveled to [place] and has nausea since.  She has no other GI symptoms or fever.  She is due remicade tomorrow.  She has two questions should she go for Remicade tomorrow, and she noticed her symptoms started soon after increasing her 6MP could it be that?  Please Arnoldo Hooker

## 2012-04-29 ENCOUNTER — Other Ambulatory Visit: Payer: Medicare Other

## 2012-04-29 ENCOUNTER — Encounter (HOSPITAL_COMMUNITY): Payer: Self-pay

## 2012-04-29 ENCOUNTER — Encounter (HOSPITAL_COMMUNITY)
Admission: RE | Admit: 2012-04-29 | Discharge: 2012-04-29 | Disposition: A | Discharge status: Home / Self Care | Payer: Medicare Other | Source: Ambulatory Visit | Attending: Internal Medicine | Admitting: Internal Medicine

## 2012-04-29 ENCOUNTER — Other Ambulatory Visit: Payer: Self-pay | Admitting: Internal Medicine

## 2012-04-29 VITALS — BP 138/74 | HR 65 | Temp 97.3°F | Resp 18 | Wt 170.4 lb

## 2012-04-29 DIAGNOSIS — K5090 Crohn's disease, unspecified, without complications: Secondary | ICD-10-CM

## 2012-04-29 DIAGNOSIS — R110 Nausea: Secondary | ICD-10-CM

## 2012-04-29 MED ORDER — SODIUM CHLORIDE 0.9 % IV SOLN: INTRAVENOUS | Status: DC

## 2012-04-29 MED ORDER — ACETAMINOPHEN 325 MG PO TABS
650.0000 mg | ORAL_TABLET | ORAL | Status: DC
  Administered 2012-04-29: 650 mg via ORAL
  Filled 2012-04-29: qty 2

## 2012-04-29 MED ORDER — SODIUM CHLORIDE 0.9 % IV SOLN
10.0000 mg/kg | INTRAVENOUS | Status: DC
  Administered 2012-04-29: 800 mg via INTRAVENOUS
  Filled 2012-04-29: qty 80

## 2012-04-29 MED ORDER — DIPHENHYDRAMINE HCL 25 MG PO CAPS
50.0000 mg | ORAL_CAPSULE | ORAL | Status: DC
  Administered 2012-04-29: 50 mg via ORAL
  Filled 2012-04-29: qty 2

## 2012-04-29 NOTE — Telephone Encounter (Signed)
Patient advised.  She will come for labs after Remicade today

## 2012-04-29 NOTE — Telephone Encounter (Signed)
Correction - she is on azathioprine

## 2012-04-29 NOTE — Telephone Encounter (Signed)
It is most likely the 6 MP She can try taking it later in the day after she eats breakfast  She should take Remicade  Needs 6TG and 6MnMP levels (metabolites)

## 2012-05-02 ENCOUNTER — Telehealth: Payer: Self-pay | Admitting: Internal Medicine

## 2012-05-02 MED ORDER — PROMETHAZINE HCL 25 MG PO TABS: 25.0000 mg | ORAL_TABLET | Freq: Three times a day (TID) | ORAL | Status: DC | PRN

## 2012-05-02 NOTE — Telephone Encounter (Signed)
Per Dr. Carlean Purl have patient hold imuran over the weekend and resume on Monday.  She can have phenergan 25 mg q 8 hours prn nausea.  She is aware and verbalized understanding

## 2012-05-03 LAB — THIOPURINE METABOLITES
6-MMPN Metaboilte: 817 pmol/8x 10E8
6-TGN Metabolite: 173 pmol/8x 10E8

## 2012-05-05 NOTE — Progress Notes (Signed)
Quick Note:  Restart azathioprine at 50 mg daily for 1 week Then go to 100 mg daily for 1 week if ok Then go back to 150 mg daily if ok If nausea problems again - let me know ______

## 2012-05-05 NOTE — Progress Notes (Signed)
Quick Note:  Her levels were not high Is she still nauseous? ______

## 2012-05-06 ENCOUNTER — Other Ambulatory Visit (INDEPENDENT_AMBULATORY_CARE_PROVIDER_SITE_OTHER): Payer: Medicare Other

## 2012-05-06 ENCOUNTER — Encounter: Payer: Self-pay | Admitting: Physician Assistant

## 2012-05-06 ENCOUNTER — Other Ambulatory Visit: Payer: Self-pay

## 2012-05-06 ENCOUNTER — Telehealth: Payer: Self-pay

## 2012-05-06 ENCOUNTER — Ambulatory Visit (INDEPENDENT_AMBULATORY_CARE_PROVIDER_SITE_OTHER): Payer: Medicare Other | Admitting: Physician Assistant

## 2012-05-06 VITALS — BP 118/86 | HR 84 | Ht 66.0 in | Wt 172.0 lb

## 2012-05-06 DIAGNOSIS — K5010 Crohn's disease of large intestine without complications: Secondary | ICD-10-CM

## 2012-05-06 DIAGNOSIS — R1013 Epigastric pain: Secondary | ICD-10-CM

## 2012-05-06 DIAGNOSIS — R110 Nausea: Secondary | ICD-10-CM

## 2012-05-06 LAB — COMPREHENSIVE METABOLIC PANEL
AST: 24 U/L (ref 0–37)
Albumin: 3.5 g/dL (ref 3.5–5.2)
Alkaline Phosphatase: 62 U/L (ref 39–117)
Glucose, Bld: 102 mg/dL — ABNORMAL HIGH (ref 70–99)
Potassium: 4.6 mEq/L (ref 3.5–5.1)
Sodium: 136 mEq/L (ref 135–145)
Total Protein: 7 g/dL (ref 6.0–8.3)

## 2012-05-06 LAB — CBC WITH DIFFERENTIAL/PLATELET
Eosinophils Relative: 4.8 % (ref 0.0–5.0)
Lymphocytes Relative: 25.4 % (ref 12.0–46.0)
MCV: 110 fl — ABNORMAL HIGH (ref 78.0–100.0)
Monocytes Absolute: 0.6 10*3/uL (ref 0.1–1.0)
Neutrophils Relative %: 59.9 % (ref 43.0–77.0)
Platelets: 446 10*3/uL — ABNORMAL HIGH (ref 150.0–400.0)
WBC: 6.3 10*3/uL (ref 4.5–10.5)

## 2012-05-06 LAB — AMYLASE: Amylase: 49 U/L (ref 27–131)

## 2012-05-06 NOTE — Patient Instructions (Addendum)
Please follow the low fat diet   Please stay off of Azathioprine

## 2012-05-06 NOTE — Progress Notes (Signed)
Subjective:    Patient ID: [name], female    DOB: [birth year], 71 y.o.   MRN: [id number]  HPI [name] [name] is a pleasant 71 year old white female known to Dr. Carlean [name] with history of Crohn's colitis. She has been managed with Remicade and Imuran. She was seen by Dr. Carlean [name] in September and at that time was doing well. Her azathioprine dose was increased from 50 mg 200 mg after that office visit. She states now over the past couple of weeks she has had constant epigastric discomfort and nausea he says she was feeling somewhat nauseated before she increased the dose of the azathioprine. She had metabolite levels done were which were within normal range however when she called with complaints of nausea it was advised she stop the azathioprine which he has done over the past 5 days. She says she feels less nauseated but is still hurting and had some sharp epigastric pains last evening. She denies any radiation to her chest or her back area she's not had any fever or chills she has had some increased belching. She is having some mild diarrhea and and scant amount of blood does not feel that her colitis is flaring. She has not been on any regular aspirin or NSAIDs and does take Protonix chronically for reflux symptoms. Patient is status post cholecystectomy.  Labs were done stat today at the time of her office visit; WBC 6.1 hemoglobin 14 hematocrit 41.4 MCV of 110 amylase 49 lipase elevated at 67 electrolytes within normal limits and liver function studies normal.    Review of Systems  Constitutional: Positive for appetite change.  HENT: Negative.   Eyes: Negative.   Respiratory: Negative.   Cardiovascular: Negative.   Gastrointestinal: Positive for nausea and abdominal pain.  Genitourinary: Negative.   Musculoskeletal: Negative.   Neurological: Negative.   Hematological: Negative.   Psychiatric/Behavioral: Negative.    Outpatient Encounter Prescriptions as of 05/06/2012  Medication Sig Dispense  Refill  . b complex vitamins capsule Take 1 capsule by mouth daily.        . Calcium Carbonate-Vit D-Min (CALCIUM 1200) 1200-1000 MG-UNIT CHEW Take 1 chew by mouth once daily  1 each  0  . cetirizine (ZYRTEC) 10 MG tablet Take 10 mg by mouth daily as needed. allergies      . Cholecalciferol (VITAMIN D) 2000 UNITS CAPS Take 1 capsule (2,000 Units total) by mouth daily.  1 capsule  0  . cholestyramine (QUESTRAN) 4 GM/DOSE powder Take 4 g by mouth 2 (two) times daily as needed.       . dicyclomine (BENTYL) 20 MG tablet Take 20 mg by mouth 3 (three) times daily. For stomach pain      . inFLIXimab (REMICADE) 100 MG injection Infuse 10 mg/Kg q 6 weeks  1 each  3  . losartan-hydrochlorothiazide (HYZAAR) 100-12.5 MG per tablet Take 1 tablet by mouth daily.        . Multiple Vitamins-Minerals (MULTIVITAMINS THER. W/MINERALS) TABS Take 1 tablet by mouth daily.        . Omega-3 Fatty Acids (FISH OIL) 1000 MG CAPS Take 1,000 mg by mouth daily.        . pantoprazole (PROTONIX) 40 MG tablet Take 1 tablet (40 mg total) by mouth daily.  30 tablet  11  . promethazine (PHENERGAN) 25 MG tablet Take 1 tablet (25 mg total) by mouth every 8 (eight) hours as needed for nausea.  20 tablet  0  . traMADol (ULTRAM) 50 MG tablet Take  50 mg by mouth 2 (two) times daily as needed. For pain. Maximum dose= 8 tablets per day      . DISCONTD: azaTHIOprine (IMURAN) 50 MG tablet Take 3 tablets (150 mg total) by mouth daily.  90 tablet  3   Allergies  Allergen Reactions  . Adhesive (Tape) Rash    Taking skin off  . Sulfa Antibiotics Itching and Rash       Patient Active Problem List  Diagnosis  . Crohn's colitis - left -sided  . Hypertension  . Arthritis  . Immunosuppression - on Remicade and azathioprine  . Osteopenia   History   Social History  . Marital Status: Widowed    Spouse Name: N/A    Number of Children: 3  . Years of Education: N/A   Occupational History  . retired     Social History Main Topics  .  Smoking status: Former Smoker    Types: Cigarettes    Quit date: 09/26/1968  . Smokeless tobacco: Never Used  . Alcohol Use: No     rare wine  . Drug Use: No  . Sexually Active: No   Other Topics Concern  . Not on file   Social History Narrative  . No narrative on file    Objective:   Physical Exam well-developed older white female in no acute distress, pleasant blood pressure 118/86 pulse 84 height 5 foot 6 weight 172. HEENT; nontraumatic normocephalic EOMI PERRLA sclera anicteric,Neck; Supple; no JVD, Cardiovascula;r regular rate and rhythm with S1-S2 no murmur or gallop, Pulmonary; clear bilaterally, Abdomen; soft she is tender in the epigastrium no guarding no rebound no palpable mass or hepatosplenomegaly bowel sounds are active, Rectal; exam not done, Extremities; no clubbing cyanosis or edema skin warm and dry, Psyc;h mood and affect normal and appropriate        Assessment & Plan:  #53  71 year old female with Crohn's colitis on Remicade and azathioprine with recent increase in the azathioprine dose which temporally was associated with nausea and now two-week history of epigastric pain. Her labs suggest that she's probably had a mild pancreatitis likely secondary to azathioprine.  Plan; low fat  bland diet Hold and azathioprine for now and over the next one month until she returns to see Dr. Carlean [name]. She is due for Remicade infusion again November 19 and will keep this appointment. Continue Phenergan as needed for nausea and Protonix 40 mg by mouth daily. Avoid alcohol

## 2012-05-06 NOTE — Telephone Encounter (Signed)
Per Nicoletta Ba PA patient to remain off azathioprine until her office visit with Dr. Carlean Purl on 06/13/12.  She is advised per Nicoletta Ba PA ok to go to beach as long as she is feeling better toward the end of the week,  and call if her symptoms don't improve

## 2012-05-06 NOTE — Progress Notes (Signed)
Note that the dosing change in her medications was related to a switch from 6 MP to azathioprine to save money (higher mg with azathioprine vs. 6 MP) Agree with Ms. Genia Harold assessment and plan. [name], MD, [name]

## 2012-05-28 ENCOUNTER — Telehealth: Payer: Self-pay | Admitting: Internal Medicine

## 2012-05-28 DIAGNOSIS — K5090 Crohn's disease, unspecified, without complications: Secondary | ICD-10-CM

## 2012-05-28 NOTE — Telephone Encounter (Signed)
I called her and told her to start 40 mg prednisone a day until she sees [place]. Says nausea is same as it was before she stopped azathioprine. Is on maximal Remicade   Let's get a Remicade antibody on her when she comes

## 2012-05-28 NOTE — Telephone Encounter (Signed)
Order entered

## 2012-05-28 NOTE — Telephone Encounter (Signed)
Patient reports about a 1 week history of right upper quad pain and nausea.  She reports that she is now also having rectal bleeding.  She has not been on azathiopurine as instructed by Nicoletta Ba PA at the last office visit.  I have scheduled her for an appt for this Friday.  Dr. Carlean Purl please advise if she needs any additional orders prior to the appt

## 2012-05-30 ENCOUNTER — Other Ambulatory Visit (INDEPENDENT_AMBULATORY_CARE_PROVIDER_SITE_OTHER): Payer: Medicare Other

## 2012-05-30 ENCOUNTER — Encounter: Payer: Self-pay | Admitting: Internal Medicine

## 2012-05-30 ENCOUNTER — Ambulatory Visit (INDEPENDENT_AMBULATORY_CARE_PROVIDER_SITE_OTHER): Payer: Medicare Other | Admitting: Internal Medicine

## 2012-05-30 VITALS — BP 152/80 | HR 72 | Ht 66.0 in | Wt 173.2 lb

## 2012-05-30 DIAGNOSIS — R748 Abnormal levels of other serum enzymes: Secondary | ICD-10-CM

## 2012-05-30 DIAGNOSIS — K5010 Crohn's disease of large intestine without complications: Secondary | ICD-10-CM

## 2012-05-30 DIAGNOSIS — K921 Melena: Secondary | ICD-10-CM

## 2012-05-30 DIAGNOSIS — K5090 Crohn's disease, unspecified, without complications: Secondary | ICD-10-CM

## 2012-05-30 DIAGNOSIS — R109 Unspecified abdominal pain: Secondary | ICD-10-CM

## 2012-05-30 LAB — LIPASE: Lipase: 79 U/L — ABNORMAL HIGH (ref 11.0–59.0)

## 2012-05-30 MED ORDER — PREDNISONE 10 MG PO TABS: 40.0000 mg | ORAL_TABLET | Freq: Every day | ORAL | Status: DC

## 2012-05-30 NOTE — Patient Instructions (Addendum)
Your physician has requested that you go to the basement for lab work before leaving today.  We have sent the following medications to your pharmacy for you to pick up at your convenience: Prednisone  Follow-up in 2 weeks.  Thank you for choosing me and [facility].  [name], M.D., [facility]

## 2012-05-30 NOTE — Assessment & Plan Note (Signed)
Flaring Will check Remicade antibodies Continue prednisone for now ? Add topical mesalamine, ? Lower dose azathioprine

## 2012-05-30 NOTE — Progress Notes (Signed)
  Subjective:    Patient ID: La Verkin, female    DOB: 09-03-1940, 71 y.o.   MRN: [id number]  HPI The patient returns because of a flare in her symptoms. She has Crohn's colitis. Predominantly left-sided. She developed a little of constipation over the [place] she can having some increased abdominal pain and started passing blood. She contacted [place] and I restarted her on prednisone 40 mg daily. There is only scant bleeding at this time. She has not had much in the way of diarrhea. He still complains of marital abdominal pains including a right lower quadrant. She has not had fever. Appetite is somewhat off. When last year we have stopped her azathioprine because of nausea and abdominal discomfort and elevated lipase. That is better but she still has some of those symptoms were not nearly as severe. Medications, allergies, past medical history, past surgical history, family history and social history are reviewed and updated in the EMR.   Review of Systems As per history of present illness    Objective:   Physical Exam General:  NAD Eyes:   anicteric Lungs:  clear Heart:  S1S2 no rubs, murmurs or gallops Abdomen:  soft and mildly tender in the right lower quadrant, BS+ Rectal exam with female staff present demonstrates normal anoderm, it is nontender normal resting tone there is a small rectocele. Anoscopy is performed demonstrating probable hemorrhoids in the canal but there is clear proctitis seen with mucosal exudate beefy red mucosa and some ulcerations. Ext:   no edema      Assessment & Plan:   1. Crohn's colitis - left -sided   2. Hematochezia   3. Abdominal  pain, other specified site   4. Abnormal serum lipase level    Difficult situation where she seems to be steroid-dependent. She can never go for a long time without getting back on prednisone though I had hopes that azathioprine would take care of that she  appeared to have side effects. I current plan is to draw a Remicade antibodies. Continue the prednisone going to 30 mg daily in a week and see me in 2 weeks.  As per the problem oriented charting, options include topical steroids with an MI, topical mesalamine with enema or suppository. She wants to remain well through the holidays, would like to avoid steroids but she may also require a low dose of them. The other option would be restarting azathioprine at lower dose which may need to be done after she gets through the holidays. I will recheck amylase and lipase today.   CC: [name], NP

## 2012-06-02 NOTE — Progress Notes (Signed)
Quick Note:  Lipase mildly elevated Amylase normal Do not think she has ongoing pancreatitis Awaiting infliximab antibodies ______

## 2012-06-10 ENCOUNTER — Other Ambulatory Visit: Payer: Self-pay

## 2012-06-10 ENCOUNTER — Encounter (HOSPITAL_COMMUNITY)
Admission: RE | Admit: 2012-06-10 | Discharge: 2012-06-10 | Disposition: A | Discharge status: Home / Self Care | Payer: Medicare Other | Source: Ambulatory Visit | Attending: Internal Medicine | Admitting: Internal Medicine

## 2012-06-10 ENCOUNTER — Encounter (HOSPITAL_COMMUNITY): Payer: Self-pay

## 2012-06-10 VITALS — BP 155/80 | HR 58 | Temp 97.3°F | Resp 16 | Ht 66.0 in | Wt 172.0 lb

## 2012-06-10 DIAGNOSIS — K5090 Crohn's disease, unspecified, without complications: Secondary | ICD-10-CM

## 2012-06-10 MED ORDER — ACETAMINOPHEN 325 MG PO TABS
650.0000 mg | ORAL_TABLET | Freq: Every day | ORAL | Status: DC
  Administered 2012-06-10: 650 mg via ORAL
  Filled 2012-06-10: qty 2

## 2012-06-10 MED ORDER — SODIUM CHLORIDE 0.9 % IV SOLN
INTRAVENOUS | Status: DC
  Administered 2012-06-10: 11:00:00 via INTRAVENOUS

## 2012-06-10 MED ORDER — DIPHENHYDRAMINE HCL 25 MG PO TABS
50.0000 mg | ORAL_TABLET | Freq: Every day | ORAL | Status: DC
  Administered 2012-06-10: 50 mg via ORAL
  Filled 2012-06-10 (×3): qty 2

## 2012-06-10 MED ORDER — SODIUM CHLORIDE 0.9 % IV SOLN
10.0000 mg/kg | INTRAVENOUS | Status: DC
  Administered 2012-06-10: 800 mg via INTRAVENOUS
  Filled 2012-06-10: qty 80

## 2012-06-13 ENCOUNTER — Ambulatory Visit: Payer: Medicare Other | Admitting: Internal Medicine

## 2012-06-13 ENCOUNTER — Encounter: Payer: Self-pay | Admitting: Internal Medicine

## 2012-06-13 ENCOUNTER — Ambulatory Visit (INDEPENDENT_AMBULATORY_CARE_PROVIDER_SITE_OTHER): Payer: Medicare Other | Admitting: Internal Medicine

## 2012-06-13 VITALS — BP 146/70 | HR 60 | Ht 66.0 in | Wt 175.0 lb

## 2012-06-13 DIAGNOSIS — IMO0002 Reserved for concepts with insufficient information to code with codable children: Secondary | ICD-10-CM

## 2012-06-13 DIAGNOSIS — Z79899 Other long term (current) drug therapy: Secondary | ICD-10-CM

## 2012-06-13 DIAGNOSIS — K5010 Crohn's disease of large intestine without complications: Secondary | ICD-10-CM

## 2012-06-13 DIAGNOSIS — Z7952 Long term (current) use of systemic steroids: Secondary | ICD-10-CM

## 2012-06-13 LAB — INFLIXIMAB (IFX) CONC+ IFX AB
Anti-Infliximab Antibody: 194 ng/mL — ABNORMAL HIGH
Infliximab Drug Level: 23 ug/mL

## 2012-06-13 MED ORDER — PREDNISONE 10 MG PO TABS: 20.0000 mg | ORAL_TABLET | Freq: Every day | ORAL | Status: DC

## 2012-06-13 MED ORDER — DICYCLOMINE HCL 20 MG PO TABS: 20.0000 mg | ORAL_TABLET | Freq: Three times a day (TID) | ORAL | Status: DC

## 2012-06-13 NOTE — Progress Notes (Signed)
  Subjective:    Patient ID: Travilah, female    DOB: 10-30-1940, 71 y.o.   MRN: [id number]  HPI Much better now - no diarrhea or bleeding. Mild RLQ cramps. Needs refill on dicyclomine Had remicade last week  Medications, allergies, past medical history, past surgical history, family history and social history are reviewed and updated in the EMR.  Review of Systems Sleeping ok    Objective:   Physical Exam General:  NAD Eyes:   anicteric Lungs:  clear Abdomen:  soft and nontender, BS+      Assessment & Plan:   1. Crohn's colitis- left-sided   2. Long-term use of immunosuppressant medication   3. Long term current use of systemic steroids    1. Still need Remicade Ab levels 2. Reduce to 20 mg daily prednisone and every 2 weeks by 5 to 10 mg on 12/20 3. Steroid enema Tx 4. F/u 2 months  Remicade level and antibody test has returned with + antibodies. So - may not make sense to continue Remicade. Will investigate and notify.  [id number] F, NP

## 2012-06-13 NOTE — Patient Instructions (Addendum)
Take Prednisone as directed today.  We have sent the following medications to your pharmacy for you to pick up at your convenience: Generic bentyl  We will call you with the remicade test results and plans as soon as they come in.  Follow up with [place] in 2 months.  Thank you for choosing me and [facility].  [name], M.D., [facility]

## 2012-06-16 ENCOUNTER — Other Ambulatory Visit: Payer: Self-pay

## 2012-06-16 NOTE — Progress Notes (Signed)
Quick Note:  She has + antibodies to infliximab  1) cancel Remicade and take off list 2) need to investigate if she can get Humira or Cimzia 3) let her know ______

## 2012-06-17 ENCOUNTER — Telehealth: Payer: Self-pay | Admitting: Internal Medicine

## 2012-06-17 NOTE — Telephone Encounter (Signed)
Both Humira and Cimzia are covered.  Humira is a little cheaper at $798 a month.  Not sure if she will be eligible for discounts due to her Medicare.  Do you have a preference? If not I"ll start with Humira they have more resources for payment.

## 2012-06-18 NOTE — Telephone Encounter (Signed)
Looking into Humira at this time - waiting to see re: cost and ? Affordable for her

## 2012-06-24 ENCOUNTER — Telehealth: Payer: Self-pay

## 2012-06-24 MED ORDER — ADALIMUMAB 40 MG/0.8ML ~~LOC~~ KIT: PACK | SUBCUTANEOUS | Status: DC

## 2012-06-24 NOTE — Telephone Encounter (Signed)
[facility] called to confirm which Humira Pen starter kit needed.  Per Barb Merino, RN it would be the starter kit for crohn's.  Per the pharmacy it will need a prior auth., they will send the form over for [place] to work on this and will hold the rx on file for now.

## 2012-06-24 NOTE — Telephone Encounter (Signed)
Prior auth form started with [facility], they have to fax a form.

## 2012-06-25 NOTE — Telephone Encounter (Signed)
Patient aware that we are working on a prior auth.  I will call her as soon as I get a response.

## 2012-06-26 ENCOUNTER — Telehealth: Payer: Self-pay | Admitting: Internal Medicine

## 2012-06-26 MED ORDER — ADALIMUMAB 40 MG/0.8ML ~~LOC~~ KIT: PACK | SUBCUTANEOUS | Status: DC

## 2012-06-26 NOTE — Telephone Encounter (Signed)
Patient's Humira is approved, she will check with the pharmacy on the price.  She will call me back one way or another for the next step

## 2012-06-26 NOTE — Telephone Encounter (Signed)
[facility] patient needs to get RX from [facility].  Rx resent

## 2012-06-27 ENCOUNTER — Telehealth: Payer: Self-pay | Admitting: Internal Medicine

## 2012-06-27 NOTE — Telephone Encounter (Signed)
See phone note on 06/17/12

## 2012-06-27 NOTE — Telephone Encounter (Signed)
Humira is $800 a month. I attempted a tier exemption with [employer].  It is a tier 5 and they will make no exceptions.  There is no appeal or other options.  Cimzia will be the same cost and tier.  I have left a message for the patient that I will speak with the Humira rep next week when she is here to see if there are other options.

## 2012-06-30 NOTE — Telephone Encounter (Signed)
Rx info faxed to [place] at [facility].  She will look into any foundation grants or other options for the patient.

## 2012-07-01 ENCOUNTER — Telehealth: Payer: Self-pay | Admitting: Internal Medicine

## 2012-07-01 NOTE — Telephone Encounter (Signed)
Patient dose verified

## 2012-07-09 ENCOUNTER — Telehealth: Payer: Self-pay | Admitting: Internal Medicine

## 2012-07-09 NOTE — Telephone Encounter (Signed)
See phone note that is still open from 06/17/12

## 2012-07-10 ENCOUNTER — Telehealth: Payer: Self-pay | Admitting: Internal Medicine

## 2012-07-10 NOTE — Telephone Encounter (Signed)
Spoke with rep and told them Diplomat has been sent to rx for patient.

## 2012-07-18 NOTE — Telephone Encounter (Signed)
I spoke with the patient and she is doing well, no problems at this time.  She does not qualify at this time for any foundation assistance for Humira.  She is not able to afford the monthly rx.

## 2012-07-22 ENCOUNTER — Encounter (HOSPITAL_COMMUNITY): Payer: Medicare Other

## 2012-07-24 NOTE — Telephone Encounter (Signed)
Patient assistance grant will cover the started kit only then the funds would be depleted.  She will remain on prednisone for now.  She also is advised that the funds will be available for 1 year if we are able to make some changes to her plan.  She will keep appt for Feb.

## 2012-07-24 NOTE — Telephone Encounter (Signed)
Have her see me this month please You can ask her if assistance would change her mind about trying Humira Otherwise stay with prednisone

## 2012-07-24 NOTE — Telephone Encounter (Signed)
Patient scheduled for office visit on 06/27/13 11:30

## 2012-08-25 ENCOUNTER — Encounter: Payer: Self-pay | Admitting: Internal Medicine

## 2012-08-25 ENCOUNTER — Ambulatory Visit (INDEPENDENT_AMBULATORY_CARE_PROVIDER_SITE_OTHER): Payer: Medicare Other | Admitting: Internal Medicine

## 2012-08-25 VITALS — BP 164/74 | HR 56 | Ht 65.5 in | Wt 183.5 lb

## 2012-08-25 DIAGNOSIS — IMO0002 Reserved for concepts with insufficient information to code with codable children: Secondary | ICD-10-CM

## 2012-08-25 DIAGNOSIS — D899 Disorder involving the immune mechanism, unspecified: Secondary | ICD-10-CM

## 2012-08-25 DIAGNOSIS — K5010 Crohn's disease of large intestine without complications: Secondary | ICD-10-CM

## 2012-08-25 DIAGNOSIS — Z7952 Long term (current) use of systemic steroids: Secondary | ICD-10-CM | POA: Insufficient documentation

## 2012-08-25 MED ORDER — PREDNISONE 5 MG PO TABS: 7.5000 mg | ORAL_TABLET | Freq: Every day | ORAL | Status: DC

## 2012-08-25 MED ORDER — MESALAMINE ER 0.375 G PO CP24: 375.0000 mg | ORAL_CAPSULE | Freq: Every day | ORAL | Status: DC

## 2012-08-25 MED ORDER — MESALAMINE ER 0.375 G PO CP24: 1500.0000 mg | ORAL_CAPSULE | Freq: Every day | ORAL | Status: DC

## 2012-08-25 NOTE — Patient Instructions (Addendum)
Today you have been given Apriso samples to try before picking up your rx that has been sent to the pharmacy.  Continue taking your Prednisone 7.102m for a month then if doing well go to 570ma day.  Come back and see usKorean 3 months.  [facility].  CaGatha MayerM.D., [facility]

## 2012-08-25 NOTE — Progress Notes (Signed)
Subjective:    Patient ID: [name] [name], female    DOB: 1941-03-01, 72 y.o.   MRN: [id number]  HPI Patient presents in followup, for her Crohn's disease today. She is currently on 10 mg prednisone daily and feels very well, is that she has a long time, without diarrhea or rectal bleeding. She has a good energy level. She was able to take 2 over great-grandchildren fishing yesterday. She had a pretty good holiday season and says she ate too much but she always gained some and and then loses in the summer when she's more active. She has been refractory to Remicade therapy and intolerant of azathioprine because it caused nausea and elevated lipase with suspected pancreatitis problems. Humira was prescribed but it is too costly. She intends to change her Medicare part the plan in late 2014 so that she has biologic some available on that plan and that they are cheaper. Allergies  Allergen Reactions  . Azathioprine Nausea Only    Elevated lipase also - ? Pancreatitis   . Adhesive (Tape) Rash    Taking skin off  . Sulfa Antibiotics Itching and Rash   Outpatient Prescriptions Prior to Visit  Medication Sig Dispense Refill  . b complex vitamins capsule Take 1 capsule by mouth daily.        . Calcium Carbonate-Vit D-Min (CALCIUM 1200) 1200-1000 MG-UNIT CHEW Take 1 chew by mouth once daily  1 each  0  . cetirizine (ZYRTEC) 10 MG tablet Take 10 mg by mouth daily as needed. allergies      . Cholecalciferol (VITAMIN D) 2000 UNITS CAPS Take 1 capsule (2,000 Units total) by mouth daily.  1 capsule  0  . cholestyramine (QUESTRAN) 4 GM/DOSE powder Take 4 g by mouth 2 (two) times daily as needed.       . dicyclomine (BENTYL) 20 MG tablet Take 1 tablet (20 mg total) by mouth 3 (three) times daily. For stomach pain  90 tablet  5  . Multiple Vitamins-Minerals (MULTIVITAMINS THER. W/MINERALS) TABS Take 1 tablet by mouth daily.        . Omega-3 Fatty Acids (FISH OIL) 1000 MG CAPS Take 1,000 mg by mouth daily.         . pantoprazole (PROTONIX) 40 MG tablet Take 1 tablet (40 mg total) by mouth daily.  30 tablet  11  . traMADol (ULTRAM) 50 MG tablet Take 50 mg by mouth 2 (two) times daily as needed. For pain. Maximum dose= 8 tablets per day      . [DISCONTINUED] adalimumab (HUMIRA PEN) 40 MG/0.8ML injection Inject 40 mg sq every other week after the completion of the starter kit  2 each  6  . [DISCONTINUED] predniSONE (DELTASONE) 10 MG tablet Take 2 tablets (20 mg total) by mouth daily. On Dec 6 take 15 mg daily On Dec 20 take 10 mg daily      . [DISCONTINUED] adalimumab (HUMIRA PEN STARTER) 40 MG/0.8ML injection Inject 4 pens sq on week 0, then inject 2 pens on week 2  1 each  0  . [DISCONTINUED] losartan-hydrochlorothiazide (HYZAAR) 100-12.5 MG per tablet Take 1 tablet by mouth daily.         Last reviewed on 08/25/2012 11:54 AM by [name], MD Past Medical History  Diagnosis Date  . Crohn's colitis   . Hypertension   . Colon polyps     adenomatous polyp June 2012  . Complication of anesthesia     slow to wake  .  Shortness of breath   . Blood transfusion     1966  . Headache     hx migraines  . Arthritis     shoulder  . Bell's palsy   . GERD (gastroesophageal reflux disease)    Past Surgical History  Procedure Date  . Cholecystectomy   . Abdominal hysterectomy   . Tonsillectomy   . Shoulder surgery     right  . Foot ganglion excision     left  . Appendectomy   . Flexible sigmoidoscopy 06/08/2011    severe Crohn's colitis to descending colon at least  . Colonoscopy multiple  . Flex sigmoidoscopy with biospy 01/17/12          Review of Systems As above, her blood pressure medicines are being adjusted by her primary care physician at this time.    Objective:   Physical Exam General:  NAD, perhaps mildly cushingoid Eyes:   anicteric Lungs:  clear Heart:  S1S2 no rubs, murmurs or gallops Abdomen:  soft and nontender, BS+ Ext:   no edema     Assessment & Plan:   1.  Left-sided Crohn's colitis , clinical remission at this time. Intolerant of immunomodulators and refractory to Remicade   2. Chronic steroid use    1. She is improved. She will continue on chronic prednisone at this point we'll try to reduce to 7.5 mg daily. She understands the potential risks of long-term steroid therapy. These included cataracts, hypertension, and problems, osteopenia) and joint problems. However she cannot afford Humira or other biologic therapy at this time, so we'll try to get her diet with the lowest dose of prednisone possible until she can change her Medicare part the plan and biologic therapy is more affordable him and we can consider that. 2. In the meantime will put her on Apriso 1.5 g daily, as that is on her formulary at night don't think the mesalamine will cause problems. He knows to monitor for any changes. 3. She will reduced to 5 mg prednisone daily in one month if she is doing well. 4. The me in 3 months routinely, sooner if needed. 5. We did discuss the possibility of methotrexate therapy, that has not been tried her. Given the potential possible side effects she has elected not to do so.  CC: [name], NP

## 2012-09-02 ENCOUNTER — Telehealth: Payer: Self-pay | Admitting: Internal Medicine

## 2012-09-02 NOTE — Telephone Encounter (Signed)
Aby advised and will stop Apriso and call us back with an update on symptoms.

## 2012-09-02 NOTE — Telephone Encounter (Signed)
Stop Apriso and see what happens

## 2012-09-02 NOTE — Telephone Encounter (Signed)
[name] [name] says the apriso is causing nausea.  She is having more abdominal cramps.  Legs/feet bothering her. Denies fever.  Having looser bowel movements.  Please advise Sir.  Thank you.

## 2012-09-08 ENCOUNTER — Telehealth: Payer: Self-pay | Admitting: Internal Medicine

## 2012-09-08 NOTE — Telephone Encounter (Signed)
No she did not shovel any snow, "I just looked at it"    She will call back on Thursday with an update

## 2012-09-08 NOTE — Telephone Encounter (Signed)
Patient c/o continued pain that she has attributed to the [place].  She did stop it last week, but is still having pain "under my ribcage all the way across", pain in her back and nausea.  She reports that she has pain "between my breast bone and can't breathe".  All symptoms started once she started the Apriso and decreased the prednisone to 7.5 mg. Abdominal pain is not relieved with dicyclomine.  Please advise

## 2012-09-08 NOTE — Telephone Encounter (Signed)
Patient was told to call back if she was not feeling any better.

## 2012-09-08 NOTE — Telephone Encounter (Signed)
Doubt Apriso still bothering  Take 10 mg extra prednisone today and 20 tomorrow and then 10 - Call back by Thursday with update, sooner if worse  I hope she didn't shovel snow

## 2012-09-11 ENCOUNTER — Encounter: Payer: Self-pay | Admitting: Gastroenterology

## 2012-09-11 ENCOUNTER — Ambulatory Visit (INDEPENDENT_AMBULATORY_CARE_PROVIDER_SITE_OTHER): Payer: Medicare Other | Admitting: Gastroenterology

## 2012-09-11 ENCOUNTER — Encounter: Payer: Self-pay | Admitting: Cardiology

## 2012-09-11 ENCOUNTER — Telehealth: Payer: Self-pay | Admitting: Internal Medicine

## 2012-09-11 VITALS — BP 144/66 | HR 90 | Ht 65.5 in | Wt 182.0 lb

## 2012-09-11 DIAGNOSIS — K5010 Crohn's disease of large intestine without complications: Secondary | ICD-10-CM

## 2012-09-11 MED ORDER — ZOLPIDEM TARTRATE 5 MG PO TABS: 5.0000 mg | ORAL_TABLET | Freq: Every evening | ORAL | Status: DC | PRN

## 2012-09-11 MED ORDER — HYDROCODONE-ACETAMINOPHEN 5-500 MG PO TABS: 1.0000 | ORAL_TABLET | Freq: Four times a day (QID) | ORAL | Status: DC | PRN

## 2012-09-11 NOTE — Telephone Encounter (Signed)
Pt states that she was supposed to call today and give an update of her progress. Pt states her crohn's is acting up and she was placed on prednisone but she reports she is no better. States that she is having severe pain in the lower part of her stomach. Pt scheduled to see Alonza Bogus PA today at 1:30pm. Pt aware of appt date and time.

## 2012-09-11 NOTE — Progress Notes (Signed)
09/11/2012 [name] [name] [id number] 26-May-1941   History of Present Illness: Patient is a pleasant 72 year old female who is a patient of Dr. Celesta Aver.  She has Crohns colitis and has been unable to tolerate several medication regimens.  Has antibodies to remicade.  Is working to [employer] at the end of the year so that [name] will be covered for her to try.  Had been on prednisone 10 mg daily and was doing well, but at her visit with Dr. Carlean Purl a couple of weeks ago he decreased it to 7.5 mg and put her apriso.  Last week her crohns symptoms returned.  Several episodes of diarrhea daily, 8 BM's yesterday during the day.  She has been seeing blood as well with most BM's.  Sharp lower abdominal pains that hurt even when she walked her dog today.  Nausea as well.  No vomiting, fevers, or chills.  Current Medications, Allergies, Past Medical History, Past Surgical History, Family History and Social History were reviewed in [employer] record.   Physical Exam: BP 144/66  Pulse 90  Ht 5' 5.5" (1.664 m)  Wt 182 lb (82.555 kg)  BMI 29.82 kg/m2 General: Well developed, white female in no acute distress Head: Normocephalic and atraumatic Eyes:  sclerae anicteric, conjunctiva pink  Ears: Normal auditory acuity Lungs: Clear throughout to auscultation Heart: Regular rate and rhythm Abdomen: Soft, non-distended. No masses, no hepatomegaly. Normal to hyperactive bowel sounds.  Moderate TTP in lower abdomen with some voluntary guarding. Musculoskeletal: Symmetrical with no gross deformities  Extremities: No edema  Neurological: Alert oriented x 4, grossly nonfocal Psychological:  Alert and cooperative. Normal mood and affect  Assessment and Recommendations: -Crohns colitis with flare:  Will increase prednisone to 40 mg for 3 days, 30 mg for 3 days, then decrease to 20 mg and call us with an update.  Will give vicodin for pain and ambien to help her sleep while on the  higher doses of prednisone.

## 2012-09-11 NOTE — Patient Instructions (Addendum)
Please increase your Prednisone to 40 mg for 3 days and then 30 mg for 3 days and then 20 mg and call with an update. ( You already have this at home.) We have given you a prescription for Vicodin and Ambien. CC:  [name] [name]

## 2012-09-12 NOTE — Progress Notes (Signed)
Agree with management per [name] [name]. I have seen and evaluated the patient also.

## 2012-09-15 ENCOUNTER — Encounter: Payer: Self-pay | Admitting: Internal Medicine

## 2012-09-15 NOTE — Progress Notes (Signed)
Patient ID: [name] [name], female   DOB: [birth year], 72 y.o.   MRN: [id number] Received fax from Patient [name] today stating that patient's application has been approved. The letter has been sent down to be scanned in so we will be aware of what it entails. It helps with documented out-of -pocket medication expenses related to IBD-MCR.

## 2012-09-16 ENCOUNTER — Encounter: Payer: Self-pay | Admitting: Internal Medicine

## 2012-09-16 ENCOUNTER — Telehealth: Payer: Self-pay | Admitting: Internal Medicine

## 2012-09-16 ENCOUNTER — Ambulatory Visit (INDEPENDENT_AMBULATORY_CARE_PROVIDER_SITE_OTHER): Payer: Medicare Other | Admitting: Internal Medicine

## 2012-09-16 ENCOUNTER — Other Ambulatory Visit (INDEPENDENT_AMBULATORY_CARE_PROVIDER_SITE_OTHER): Payer: Medicare Other

## 2012-09-16 VITALS — BP 156/82 | HR 56 | Ht 65.5 in | Wt 178.4 lb

## 2012-09-16 DIAGNOSIS — K5010 Crohn's disease of large intestine without complications: Secondary | ICD-10-CM

## 2012-09-16 LAB — BASIC METABOLIC PANEL
BUN: 8 mg/dL (ref 6–23)
CO2: 28 mEq/L (ref 19–32)
Chloride: 101 mEq/L (ref 96–112)
Glucose, Bld: 117 mg/dL — ABNORMAL HIGH (ref 70–99)
Potassium: 4.7 mEq/L (ref 3.5–5.1)
Sodium: 137 mEq/L (ref 135–145)

## 2012-09-16 LAB — CBC WITH DIFFERENTIAL/PLATELET
Basophils Relative: 0.1 % (ref 0.0–3.0)
Eosinophils Relative: 0.1 % (ref 0.0–5.0)
Lymphocytes Relative: 9.3 % — ABNORMAL LOW (ref 12.0–46.0)
MCV: 94.9 fl (ref 78.0–100.0)
Monocytes Absolute: 0.7 10*3/uL (ref 0.1–1.0)
Monocytes Relative: 5.6 % (ref 3.0–12.0)
Neutrophils Relative %: 84.9 % — ABNORMAL HIGH (ref 43.0–77.0)
Platelets: 425 10*3/uL — ABNORMAL HIGH (ref 150.0–400.0)
RBC: 4.86 Mil/uL (ref 3.87–5.11)
WBC: 11.8 10*3/uL — ABNORMAL HIGH (ref 4.5–10.5)

## 2012-09-16 MED ORDER — MESALAMINE 4 G RE ENEM: 4.0000 g | ENEMA | Freq: Every day | RECTAL | Status: DC

## 2012-09-16 MED ORDER — PREDNISONE 10 MG PO TABS: 40.0000 mg | ORAL_TABLET | Freq: Every day | ORAL | Status: DC

## 2012-09-16 NOTE — Telephone Encounter (Signed)
Patient notes no improvement in her symptoms since last week.  See office note from 09/11/12.  She will come in and see Dr. Carlean Purl today at 3:45

## 2012-09-16 NOTE — Progress Notes (Signed)
  Subjective:    Patient ID: [place], female    DOB: Mar 09, 1941, 72 y.o.   MRN: [id number]  HPI The ptient is here with persistent abdominal cramping, nausea and bloody diarrhea. Despite increasing prednisone late last week she is still having problems and is probably a little worse. No fever. Very similar to other flares. Is eating some and trying to stay hydrated. Uses narcotic pain medication at night. Has stopped cholestyramine at this time.  Medications, allergies, past medical history, past surgical history, family history and social history are reviewed and updated in the EMR.   Review of Systems As above    Objective:   Physical Exam General:  NAD Eyes:   anicteric Abdomen:  soft and mildly tender diffusely, BS+      Assessment & Plan:  Crohn's colitis - Plan: mesalamine (ROWASA) 4 G enema, predniSONE (DELTASONE) 10 MG tablet  1. Increase prednisone to 40 mg daily 2. Add Rowasa enema 3. CBC, BMET Stool for C diff PCR 4. Will call with results and follow-up plans later this week  WU:JWJX,BJYNWG F, NP

## 2012-09-16 NOTE — Patient Instructions (Addendum)
Today we are giving you a written rx for Prednisone, take as directed.  Your physician has requested that you go to the basement for the following lab work before leaving today: CBC/diff, BMET, C-Diff by PCR   We will call you with results and plans.  Thank you for choosing me and [facility].  [name], M.D., [facility]

## 2012-09-17 NOTE — Progress Notes (Signed)
Quick Note:  bloodwork ok overall but showing very early signs of dehydration so keep forcing fluids Will call again when C diff resulted ______

## 2012-09-22 ENCOUNTER — Telehealth: Payer: Self-pay | Admitting: Internal Medicine

## 2012-09-22 MED ORDER — SUCRALFATE 1 G PO TABS: 1.0000 g | ORAL_TABLET | Freq: Four times a day (QID) | ORAL | Status: DC

## 2012-09-22 NOTE — Telephone Encounter (Signed)
Let's try carafate 1 gram qid #120 5 refills

## 2012-09-22 NOTE — Telephone Encounter (Signed)
Patient is still having GERD and indigestion she is asking for an alternative to pantoprazole.  Her crohn's is better.  She is having nausea and "gas on my stomach", she feels pantoprazole is not helping at all.  Please advise

## 2012-09-22 NOTE — Telephone Encounter (Signed)
Patient aware.

## 2012-09-29 ENCOUNTER — Other Ambulatory Visit: Payer: Self-pay | Admitting: Gastroenterology

## 2012-09-29 NOTE — Telephone Encounter (Signed)
May we refill her vicodin, please advise, thank you.

## 2012-11-29 ENCOUNTER — Other Ambulatory Visit: Payer: Self-pay | Admitting: Internal Medicine

## 2012-12-01 NOTE — Telephone Encounter (Signed)
Spoke to [place], she is doing well.  She is requesting refill because she will run out before her next visit with you.  She wants to know if she may go to 3 tablets a day instead of 4.  She is having cataract surgery 12/05/12 and they told her that the prednisone has sped up their growth.

## 2012-12-01 NOTE — Telephone Encounter (Signed)
Ok to refill Sir?

## 2012-12-01 NOTE — Telephone Encounter (Signed)
OK to refill but what dose is she on now and how is she doing?

## 2012-12-02 NOTE — Telephone Encounter (Signed)
Called [facility] in [place] at [phone] and gave Lorie the new sig for the prednisone.  Patient was informed yesterday to check her bottle for possible new directions.  She will follow these until we see her in late May.

## 2012-12-17 ENCOUNTER — Encounter: Payer: Self-pay | Admitting: Internal Medicine

## 2012-12-17 ENCOUNTER — Ambulatory Visit (INDEPENDENT_AMBULATORY_CARE_PROVIDER_SITE_OTHER): Payer: Medicare Other | Admitting: Internal Medicine

## 2012-12-17 VITALS — BP 138/70 | HR 72 | Ht 66.0 in | Wt 183.1 lb

## 2012-12-17 DIAGNOSIS — D899 Disorder involving the immune mechanism, unspecified: Secondary | ICD-10-CM

## 2012-12-17 DIAGNOSIS — K5010 Crohn's disease of large intestine without complications: Secondary | ICD-10-CM

## 2012-12-17 DIAGNOSIS — M8580 Other specified disorders of bone density and structure, unspecified site: Secondary | ICD-10-CM

## 2012-12-17 DIAGNOSIS — M949 Disorder of cartilage, unspecified: Secondary | ICD-10-CM

## 2012-12-17 NOTE — Progress Notes (Signed)
Subjective:    Patient ID: [name] [name], female    DOB: [birth year], 72 y.o.   MRN: [id number]  HPI Ayari returns and she is doing well without diarrhea or abdominal pain except rare cramps. She has been on high-dose prednisone since the winter and is now tapering - is ok at 20 mg daily. She has had L/R cataract surgery and was told prednisone enhanced their development. She had one episode of rectal bleeding - when constipated. She is anxious to get to the beach in June. She has been doing some gardening in early Am and that can tire her out "I will be ok once I get my strength back". She is not having any indigestion at this time. Wants to stop or reduce the carafate that was added a few months ago. Finished with Rowasa enemas.  Medications, allergies, past medical history, past surgical history, family history and social history are reviewed and updated in the EMR. Review of Systems As above    Objective:   Physical Exam General:  NAD, elderly, somewhat cushingoid Eyes:   anicteric Abdomen:  soft and nontender, BS+ Ext:   no edema  Data Reviewed:  Wt Readings from Last 3 Encounters:  12/17/12 183 lb 2 oz (83.065 kg)  09/16/12 178 lb 6 oz (80.91 kg)  09/11/12 182 lb (82.555 kg)      Assessment & Plan:  Crohn's colitis-left-sided   In remission, currently at 20 mg daily prednisone   Goal is to get her back on a biologic - + Ab's to infliximab and cannot afford others - will see if there is a way to get Cimzia  at a reasonable cost as a rep has indicated   See me in 3 months, taper to 15 and then 10 mg prednisone - she has recurred/flared when below 10  Immunosuppression - on chronic prednisone, hx of Remicade and azathioprine   Will need pneumonia vaccine, possibly HBV and HAV (if/when back on biologics)  She will ask PCP for pneumovax - it has been > 5 years since last so I think she should repeat given immunosuppression  Osteopenia    Continue Ca++ and vit D    She  will also change her sucralfate to as needed    Medication List                            TAKE these medications       atenolol 25 MG tablet  Commonly known as:  TENORMIN  Take 25 mg by mouth 2 (two) times daily.     b complex vitamins capsule  Take 1 capsule by mouth daily.     Calcium 1200 1200-1000 MG-UNIT Chew  Take 1 chew by mouth once daily     cetirizine 10 MG tablet  Commonly known as:  ZYRTEC  Take 10 mg by mouth daily as needed. allergies     cholestyramine 4 GM/DOSE powder  Commonly known as:  QUESTRAN  Take 4 g by mouth 2 (two) times daily as needed.     dicyclomine 20 MG tablet  Commonly known as:  BENTYL  Take 1 tablet (20 mg total) by mouth 3 (three) times daily. For stomach pain     Fish Oil 1000 MG Caps  Take 1,000 mg by mouth daily.     multivitamins ther. w/minerals Tabs  Take 1 tablet by mouth daily.     pantoprazole 40 MG tablet  Commonly  known as:  PROTONIX  Take 1 tablet (40 mg total) by mouth daily.     predniSONE 10 MG tablet  Commonly known as:  DELTASONE  Take 3 tablets daily x 1 week then 2 tablets daily.     sucralfate 1 G tablet  Commonly known as:  CARAFATE  Take 1 g by mouth 4 (four) times daily as needed.  Changed by:  [name], MD     traMADol 50 MG tablet  Commonly known as:  ULTRAM  Take 50 mg by mouth 2 (two) times daily as needed. For pain. Maximum dose= 8 tablets per day     Vitamin D 2000 UNITS Caps  Take 1 capsule (2,000 Units total) by mouth daily.     zolpidem 5 MG tablet  Commonly known as:  AMBIEN  Take 1 tablet (5 mg total) by mouth at bedtime as needed for sleep.         KJ:ZPHX,TAVWPV F, NP

## 2012-12-17 NOTE — Patient Instructions (Addendum)
Starting June 4th please take Prednisone 76m a day for 2 weeks (until June 18th) then go to Prednisone 131ma day until further notice.  Take your carafate as needed.  Follow up with [place].   I appreciate the opportunity to care for you.   Left message on her voice mail that pneumovax vaccine is needed either here or at PCP .

## 2012-12-22 ENCOUNTER — Telehealth: Payer: Self-pay

## 2012-12-22 NOTE — Telephone Encounter (Signed)
I spoke with Rob from [employer].  He will stop by one day this week to see what assistance he can provide the patient through insurance options.  If not able to get covered through her insurance, he will sample her

## 2012-12-29 NOTE — Telephone Encounter (Signed)
Paper work will be faxed to [employer] to investigate her pharmacy benefits.

## 2013-01-05 ENCOUNTER — Encounter: Payer: Self-pay | Admitting: Internal Medicine

## 2013-01-05 ENCOUNTER — Telehealth: Payer: Self-pay | Admitting: Internal Medicine

## 2013-01-05 ENCOUNTER — Other Ambulatory Visit (INDEPENDENT_AMBULATORY_CARE_PROVIDER_SITE_OTHER): Payer: Medicare Other

## 2013-01-05 ENCOUNTER — Ambulatory Visit (INDEPENDENT_AMBULATORY_CARE_PROVIDER_SITE_OTHER): Payer: Medicare Other | Admitting: Internal Medicine

## 2013-01-05 VITALS — BP 140/80 | HR 72 | Ht 65.5 in | Wt 178.0 lb

## 2013-01-05 DIAGNOSIS — K5010 Crohn's disease of large intestine without complications: Secondary | ICD-10-CM

## 2013-01-05 DIAGNOSIS — R110 Nausea: Secondary | ICD-10-CM

## 2013-01-05 DIAGNOSIS — R1013 Epigastric pain: Secondary | ICD-10-CM

## 2013-01-05 DIAGNOSIS — K50119 Crohn's disease of large intestine with unspecified complications: Secondary | ICD-10-CM

## 2013-01-05 LAB — CBC WITH DIFFERENTIAL/PLATELET
Basophils Absolute: 0 10*3/uL (ref 0.0–0.1)
HCT: 47.7 % — ABNORMAL HIGH (ref 36.0–46.0)
Hemoglobin: 16 g/dL — ABNORMAL HIGH (ref 12.0–15.0)
Lymphs Abs: 1.2 10*3/uL (ref 0.7–4.0)
MCHC: 33.6 g/dL (ref 30.0–36.0)
MCV: 95.6 fl (ref 78.0–100.0)
Monocytes Absolute: 0.8 10*3/uL (ref 0.1–1.0)
Monocytes Relative: 8.4 % (ref 3.0–12.0)
Neutro Abs: 7.2 10*3/uL (ref 1.4–7.7)
Platelets: 449 10*3/uL — ABNORMAL HIGH (ref 150.0–400.0)
RDW: 14.2 % (ref 11.5–14.6)

## 2013-01-05 LAB — LIPASE: Lipase: 28 U/L (ref 11.0–59.0)

## 2013-01-05 LAB — COMPREHENSIVE METABOLIC PANEL
ALT: 17 U/L (ref 0–35)
Alkaline Phosphatase: 93 U/L (ref 39–117)
CO2: 24 mEq/L (ref 19–32)
Creatinine, Ser: 1 mg/dL (ref 0.4–1.2)
GFR: 60.13 mL/min (ref >60.00)
Sodium: 138 mEq/L (ref 135–145)
Total Bilirubin: 0.8 mg/dL (ref 0.3–1.2)
Total Protein: 7.7 g/dL (ref 6.0–8.3)

## 2013-01-05 LAB — AMYLASE: Amylase: 24 U/L — ABNORMAL LOW (ref 27–131)

## 2013-01-05 LAB — C-REACTIVE PROTEIN: CRP: 0.8 mg/dL (ref 0.5–20.0)

## 2013-01-05 MED ORDER — PREDNISONE 10 MG PO TABS: 40.0000 mg | ORAL_TABLET | Freq: Every day | ORAL | Status: DC

## 2013-01-05 NOTE — Progress Notes (Signed)
Subjective:    Patient ID: [place], female    DOB: 02/25/1941, 72 y.o.   MRN: [id number]  HPI [name] [name] was seen last month, she was doing well, I began to taper of her prednisone and when she went from 20-15 she started having more diarrhea and she's having pretty intense nausea and upper abdominal pain in the epigastrium radiating to the right upper quadrant. This is worse after she eats. He has not had any bleeding. There is no vomiting. She continues on her PPI has been using sucralfate twice a day she is on a fairly bland liquid diet the last few days  Medications, allergies, past medical history, past surgical history, family history and social history are reviewed and updated in the EMR.  Review of Systems As above    Objective:   Physical Exam General:  NAD Eyes:   anicteric Lungs:  clear Heart:  S1S2 no rubs, murmurs or gallops Abdomen:  soft and  mildly tender epigastrium, BS+ Ext:   no edema      Assessment & Plan:  Abdominal pain, epigastric  Nausea alone  Crohn's colitis, unspecified complication  1. CBC, CMET, lipase, amylase CRP 2. Go back to 40 mg prednisone daily 3. EGD to look for upper GI Crohn's disease.The risks and benefits as well as alternatives of endoscopic procedure(s) have been discussed and reviewed. All questions answered. The patient agrees to proceed. 4. I am looking into getting biologic therapy for her, that has been cost prohibitive but we are investigating other avenues. She has antibodies to Remicade and really did not respond well to that. So far she has really only responded to prednisone.     Medication List       These changes are accurate as of: 01/05/2013  5:15 PM. If you have any questions, ask your nurse or doctor.          TAKE these medications       atenolol 25 MG tablet  Commonly known as:  TENORMIN  Take 25 mg by mouth 2 (two) times daily.     b complex vitamins capsule  Take 1 capsule by mouth daily.     Calcium  1200 1200-1000 MG-UNIT Chew  Take 1 chew by mouth once daily     cetirizine 10 MG tablet  Commonly known as:  ZYRTEC  Take 10 mg by mouth daily as needed. allergies     cholestyramine 4 GM/DOSE powder  Commonly known as:  QUESTRAN  Take 4 g by mouth 2 (two) times daily as needed.     dicyclomine 20 MG tablet  Commonly known as:  BENTYL  Take 1 tablet (20 mg total) by mouth 3 (three) times daily. For stomach pain     Fish Oil 1000 MG Caps  Take 1,000 mg by mouth daily.     multivitamins ther. w/minerals Tabs  Take 1 tablet by mouth daily.     pantoprazole 40 MG tablet  Commonly known as:  PROTONIX  Take 1 tablet (40 mg total) by mouth daily.     predniSONE 10 MG tablet  Commonly known as:  DELTASONE  4/day.     sucralfate 1 G tablet  Commonly known as:  CARAFATE  Take 1 g by mouth 4 (four) times daily as needed.     traMADol 50 MG tablet  Commonly known as:  ULTRAM  Take 50 mg by mouth 2 (two) times daily as needed. For pain. Maximum dose= 8 tablets per day  Vitamin D 2000 UNITS Caps  Take 1 capsule (2,000 Units total) by mouth daily.     zolpidem 5 MG tablet  Commonly known as:  AMBIEN  Take 1 tablet (5 mg total) by mouth at bedtime as needed for sleep.        CC: [name], NP

## 2013-01-05 NOTE — Telephone Encounter (Signed)
Patient with abdominal pain that started on Sunday.  She states epigastric to mid abdomen.  She is currently on 15 mg of prednisone. She will come in this pm and see Dr. Carlean Purl at [facility]

## 2013-01-05 NOTE — Patient Instructions (Addendum)
Your physician has requested that you go to the basement for lab work before leaving today.  Increase your prednisone to 40 mg tablets once daily.   You have been scheduled for an endoscopy with propofol. Please follow written instructions given to you at your visit today. If you use inhalers (even only as needed), please bring them with you on the day of your procedure. Your physician has requested that you go to www.startemmi.com and enter the access code given to you at your visit today. This web site gives a general overview about your procedure. However, you should still follow specific instructions given to you by our office regarding your preparation for the procedure.

## 2013-01-06 ENCOUNTER — Telehealth: Payer: Self-pay | Admitting: Internal Medicine

## 2013-01-06 MED ORDER — DIPHENOXYLATE-ATROPINE 2.5-0.025 MG PO TABS: ORAL_TABLET | ORAL | Status: DC

## 2013-01-06 NOTE — Telephone Encounter (Signed)
Patient informed that rx being faxed to her drug store as she requested.

## 2013-01-06 NOTE — Telephone Encounter (Signed)
I do not recall that conversation  Ok to Rx Lomotil 1-2 every 4 hrs prn diarrhea #90 no refills

## 2013-01-06 NOTE — Telephone Encounter (Signed)
Did you mean to send diarrhea rx in for her?  Let me know and I will send it for you Sir.

## 2013-01-08 ENCOUNTER — Encounter: Payer: Self-pay | Admitting: Internal Medicine

## 2013-01-08 ENCOUNTER — Ambulatory Visit (AMBULATORY_SURGERY_CENTER): Payer: Medicare Other | Admitting: Internal Medicine

## 2013-01-08 VITALS — BP 155/79 | HR 48 | Temp 98.1°F | Resp 16 | Ht 65.0 in | Wt 178.0 lb

## 2013-01-08 DIAGNOSIS — K2990 Gastroduodenitis, unspecified, without bleeding: Secondary | ICD-10-CM

## 2013-01-08 DIAGNOSIS — K2960 Other gastritis without bleeding: Secondary | ICD-10-CM

## 2013-01-08 DIAGNOSIS — R110 Nausea: Secondary | ICD-10-CM

## 2013-01-08 DIAGNOSIS — K2970 Gastritis, unspecified, without bleeding: Secondary | ICD-10-CM

## 2013-01-08 DIAGNOSIS — R1013 Epigastric pain: Secondary | ICD-10-CM

## 2013-01-08 MED ORDER — SODIUM CHLORIDE 0.9 % IV SOLN: 500.0000 mL | INTRAVENOUS | Status: DC

## 2013-01-08 NOTE — Progress Notes (Signed)
Called to room to assist during endoscopic procedure.  Patient ID and intended procedure confirmed with present staff. Received instructions for my participation in the procedure from the performing physician. ewm 

## 2013-01-08 NOTE — Patient Instructions (Addendum)
The stomach was irritated and inflamed - we call that gastritis. Gastritis has many causes - I took biopsies and will call you with the results and plans.  Reduce prednisone to 30 mg daily tomorrow.  I appreciate the opportunity to care for you. [name], MD, FACG  YOU HAD AN ENDOSCOPIC PROCEDURE TODAY AT [facility]: Refer to the procedure report that was given to you for any specific questions about what was found during the examination.  If the procedure report does not answer your questions, please call your gastroenterologist to clarify.  If you requested that your care partner not be given the details of your procedure findings, then the procedure report has been included in a sealed envelope for you to review at your convenience later.  YOU SHOULD EXPECT: Some feelings of bloating in the abdomen. Passage of more gas than usual.  Walking can help get rid of the air that was put into your GI tract during the procedure and reduce the bloating. If you had a lower endoscopy (such as a colonoscopy or flexible sigmoidoscopy) you may notice spotting of blood in your stool or on the toilet paper. If you underwent a bowel prep for your procedure, then you may not have a normal bowel movement for a few days.  DIET: Your first meal following the procedure should be a light meal and then it is ok to progress to your normal diet.  A half-sandwich or bowl of soup is an example of a good first meal.  Heavy or fried foods are harder to digest and may make you feel nauseous or bloated.  Likewise meals heavy in dairy and vegetables can cause extra gas to form and this can also increase the bloating.  Drink plenty of fluids but you should avoid alcoholic beverages for 24 hours.  ACTIVITY: Your care partner should take you home directly after the procedure.  You should plan to take it easy, moving slowly for the rest of the day.  You can resume normal activity the day after the procedure  however you should NOT DRIVE or use heavy machinery for 24 hours (because of the sedation medicines used during the test).    SYMPTOMS TO REPORT IMMEDIATELY: A gastroenterologist can be reached at any hour.  During normal business hours, 8:30 AM to 5:00 PM Monday through Friday, call [phone].  After hours and on weekends, please call the GI answering service at [phone] who will take a message and have the physician on call contact you.   Following upper endoscopy (EGD)  Vomiting of blood or coffee ground material  New chest pain or pain under the shoulder blades  Painful or persistently difficult swallowing  New shortness of breath  Fever of 100F or higher  Black, tarry-looking stools  FOLLOW UP: If any biopsies were taken you will be contacted by phone or by letter within the next 1-3 weeks.  Call your gastroenterologist if you have not heard about the biopsies in 3 weeks.  Our staff will call the home number listed on your records the next business day following your procedure to check on you and address any questions or concerns that you may have at that time regarding the information given to you following your procedure. This is a courtesy call and so if there is no answer at the home number and we have not heard from you through the emergency physician on call, we will assume that you have returned to  your regular daily activities without incident.  SIGNATURES/CONFIDENTIALITY: You and/or your care partner have signed paperwork which will be entered into your electronic medical record.  These signatures attest to the fact that that the information above on your After Visit Summary has been reviewed and is understood.  Full responsibility of the confidentiality of this discharge information lies with you and/or your care-partner.  Gastritis-handout given  Reduce predinsone to 30 mg daily

## 2013-01-08 NOTE — Op Note (Signed)
[place]  [address]. [place], [address]   ENDOSCOPY PROCEDURE REPORT  PATIENT: [name], [name]  MR#: [id number] BIRTHDATE: [birth year] , 72  yrs. old GENDER: Female ENDOSCOPIST: [name], MD, [facility] PROCEDURE DATE:  01/08/2013 PROCEDURE:  EGD w/ biopsy ASA CLASS:     Class II INDICATIONS:  Epigastric pain.  Crohn's disease MEDICATIONS: Propofol (Diprivan) 120 mg IV TOPICAL ANESTHETIC: Cetacaine Spray  DESCRIPTION OF PROCEDURE: After the risks benefits and alternatives of the procedure were thoroughly explained, informed consent was obtained.  The LB [id number] endoscope was introduced through the mouth and advanced to the second portion of the duodenum. Without limitations.  The instrument was slowly withdrawn as the mucosa was fully examined.        STOMACH: Moderate gastritis (inflammation) was found in the gastric antrum.  Erythematous, granular and slightly friable. Multiple biopsies were performed using cold forceps.  Sample sent for histology.  The remainder of the upper endoscopy exam was otherwise normal. Retroflexed views revealed no abnormalities.     The scope was then withdrawn from the patient and the procedure completed.  COMPLICATIONS: There were no complications. ENDOSCOPIC IMPRESSION: 1.   Gastritis (inflammation) was found in the gastric antrum; multiple biopsies 2.   The remainder of the upper endoscopy exam was otherwise normal  RECOMMENDATIONS: 1.  Office will call with results/plans 2.  Reduce prednisone to 30 mg daily    eSigned:  [name], MD, [facility] 01/08/2013 8:17 AM CC:The Patient and [name] Scales, NP

## 2013-01-08 NOTE — Progress Notes (Signed)
Procedure ends and to recovery. Report iven and VSS

## 2013-01-08 NOTE — Progress Notes (Signed)
Patient did not experience any of the following events: a burn prior to discharge; a fall within the facility; wrong site/side/patient/procedure/implant event; or a hospital transfer or hospital admission upon discharge from the facility. (G8907) Patient did not have preoperative order for IV antibiotic SSI prophylaxis. (G8918)  

## 2013-01-09 ENCOUNTER — Telehealth: Payer: Self-pay | Admitting: *Deleted

## 2013-01-09 NOTE — Telephone Encounter (Signed)
  Follow up Call-  Call back number 01/08/2013 01/17/2012 09/27/2011  Post procedure Call Back phone  # [phone]  Permission to leave phone message Yes Yes Yes     Patient questions:  Do you have a fever, pain , or abdominal swelling? no Pain Score  0 *  Have you tolerated food without any problems? yes  Have you been able to return to your normal activities? yes  Do you have any questions about your discharge instructions: Diet   no Medications  no Follow up visit  no  Do you have questions or concerns about your Care? no  Actions: * If pain score is 4 or above: No action needed, pain <4.

## 2013-01-14 NOTE — Progress Notes (Signed)
Quick Note:  Biopsies show inflammation - does not look like Crohn's in the stomach  I want her to reduce prednisone to 20 mg daily and take that for 2 weeks and then go to 15 mg daily - as long as she is ok now - (let me know if she is having vomiting or diarrhea or sig abd pain)  See me in about 1 month We will let her know if we find out anything about Cimzia No letter or recall from [facility] ______

## 2013-02-19 ENCOUNTER — Ambulatory Visit (INDEPENDENT_AMBULATORY_CARE_PROVIDER_SITE_OTHER): Payer: Medicare Other | Admitting: Internal Medicine

## 2013-02-19 ENCOUNTER — Telehealth: Payer: Self-pay

## 2013-02-19 ENCOUNTER — Encounter: Payer: Self-pay | Admitting: Internal Medicine

## 2013-02-19 VITALS — BP 166/82 | HR 60 | Ht 65.5 in | Wt 186.0 lb

## 2013-02-19 DIAGNOSIS — K5010 Crohn's disease of large intestine without complications: Secondary | ICD-10-CM

## 2013-02-19 DIAGNOSIS — I10 Essential (primary) hypertension: Secondary | ICD-10-CM

## 2013-02-19 DIAGNOSIS — K50119 Crohn's disease of large intestine with unspecified complications: Secondary | ICD-10-CM

## 2013-02-19 DIAGNOSIS — D899 Disorder involving the immune mechanism, unspecified: Secondary | ICD-10-CM

## 2013-02-19 MED ORDER — PREDNISONE 10 MG PO TABS: 10.0000 mg | ORAL_TABLET | Freq: Every day | ORAL | Status: DC

## 2013-02-19 NOTE — Telephone Encounter (Signed)
Spoke with [name] [name] and told her we will give her a pneumovax at her next visit.  [facility] office confirmed that she got the last one in 2009.  I told her if she should get it somewhere else to get documentation for her chart here.

## 2013-02-19 NOTE — Progress Notes (Addendum)
  Subjective:    Patient ID: [place], female    DOB: 1941/03/10, 72 y.o.   MRN: [id number]  HPI [name] [name] returns having tapered to 15 mg prednisone daily and using prn diphenoxylate and atropine. She feels well without diarrhea or bleeding and no abdominal pain. Her younger sibling has been ill so she has not gone to the beach. She has been busy canning green beans and tending the vegetable garden.  A recent EGD 6/19 to evaluate abdominal pain was negative for Crohn's but did show some gastritis Medications, allergies, past medical history, past surgical history, family history and social history are reviewed and updated in the EMR.  Review of Systems Eyes tear some post cataract surgery    Objective:   Physical Exam General:  NAD, + cushingoid Eyes:   anicteric Lungs:  clear Heart:  S1S2 no rubs, murmurs or gallops Abdomen:  soft and nontender, BS+ Ext:   no edema    Assessment & Plan:  Crohn's colitis - see problem-based charting  Essential hypertension, benign w/ slightly elevated BP today - f/u PCP    Medication List       This list is accurate as of: 02/19/13 12:46 PM.  Always use your most recent med list.               atenolol 25 MG tablet  Commonly known as:  TENORMIN  Take 25 mg by mouth 2 (two) times daily.     b complex vitamins capsule  Take 1 capsule by mouth daily.     CALCIUM 1200 1200-1000 MG-UNIT Chew  Take 1 chew by mouth once daily     cetirizine 10 MG tablet  Commonly known as:  ZYRTEC  Take 10 mg by mouth daily as needed. allergies     dicyclomine 20 MG tablet  Commonly known as:  BENTYL  Take 1 tablet (20 mg total) by mouth 3 (three) times daily. For stomach pain     diphenoxylate-atropine 2.5-0.025 MG per tablet  Commonly known as:  LOMOTIL  Take 1 - 2 tablets every 4 hours as needed for diarrhea.     Fish Oil 1000 MG Caps  Take 1,000 mg by mouth daily.     multivitamins ther. w/minerals Tabs  Take 1 tablet by mouth daily.     predniSONE 10 MG tablet  Commonly known as:  DELTASONE  Take 1 tablet (10 mg total) by mouth daily.     sucralfate 1 G tablet  Commonly known as:  CARAFATE  Take 1 g by mouth 4 (four) times daily as needed.     traMADol 50 MG tablet  Commonly known as:  ULTRAM  Take 50 mg by mouth 2 (two) times daily as needed. For pain. Maximum dose= 8 tablets per day     Vitamin D 2000 UNITS Caps  Take 1 capsule (2,000 Units total) by mouth daily.       UD:THYH,OOILNZ F, NP   We checked with PCP and last pneumovax was 2009 so will give booster when she returns

## 2013-02-19 NOTE — Assessment & Plan Note (Addendum)
Improved and asymptomatic. Will reduce prednisone from 15 mg qd to 10 mg qd. Diphenoxylate and atropine has also helped. See me in 4 months She has not been able to afford biologic Tx - plans to change insurance later this year and then hopefully will be able to afford Humira.

## 2013-02-19 NOTE — Patient Instructions (Addendum)
Reduce prednisone to 10 milligrams daily. Let me know if this is not holding things in control. Watch your blood pressure at home if possible. Let me know when you get new insurance and we will prescribe the Humira.  I appreciate the opportunity to care for you.  [name], MD, [name]

## 2013-02-19 NOTE — Assessment & Plan Note (Signed)
We discussed pneumocaoccal vacine - she was going to "get a booster" but was told she needed an RX Was going to administer today but forgot - but also she has given a hx of pneumococcal vaccine in 2012 so may not be time for booster Will check with PCP

## 2013-03-03 ENCOUNTER — Telehealth: Payer: Self-pay | Admitting: Internal Medicine

## 2013-03-03 NOTE — Telephone Encounter (Signed)
Patient received a starter pack but it did not contain any drug.  Was just an information packet.  Last corespondance with insurance and [employer] and infusion services was the patient needed a buy and bill office and so far we have not been able to locate one.  I spoke with the number on the package the patient provided was [phone] , they stated that this was sent in error.  Patient aware that we are still trying to locate a [place] and [place] office.

## 2013-03-08 ENCOUNTER — Encounter: Payer: Self-pay | Admitting: Internal Medicine

## 2013-04-01 ENCOUNTER — Other Ambulatory Visit: Payer: Self-pay | Admitting: Internal Medicine

## 2013-04-01 NOTE — Telephone Encounter (Signed)
OK to refill x 3 months total

## 2013-04-01 NOTE — Telephone Encounter (Signed)
Do you want to refill? 

## 2013-04-29 ENCOUNTER — Other Ambulatory Visit: Payer: Self-pay | Admitting: Internal Medicine

## 2013-04-29 NOTE — Telephone Encounter (Signed)
OK to refill

## 2013-04-29 NOTE — Telephone Encounter (Signed)
May I refill Sir?

## 2013-07-01 ENCOUNTER — Ambulatory Visit (INDEPENDENT_AMBULATORY_CARE_PROVIDER_SITE_OTHER): Payer: Medicare Other | Admitting: Internal Medicine

## 2013-07-01 ENCOUNTER — Other Ambulatory Visit (INDEPENDENT_AMBULATORY_CARE_PROVIDER_SITE_OTHER): Payer: Medicare Other

## 2013-07-01 ENCOUNTER — Encounter: Payer: Self-pay | Admitting: Internal Medicine

## 2013-07-01 VITALS — BP 160/80 | HR 70 | Ht 65.5 in | Wt 187.4 lb

## 2013-07-01 DIAGNOSIS — IMO0002 Reserved for concepts with insufficient information to code with codable children: Secondary | ICD-10-CM

## 2013-07-01 DIAGNOSIS — Z7952 Long term (current) use of systemic steroids: Secondary | ICD-10-CM

## 2013-07-01 DIAGNOSIS — K5010 Crohn's disease of large intestine without complications: Secondary | ICD-10-CM

## 2013-07-01 LAB — C-REACTIVE PROTEIN: CRP: 0.6 mg/dL (ref 0.5–20.0)

## 2013-07-01 LAB — GLUCOSE, RANDOM: Glucose, Bld: 126 mg/dL — ABNORMAL HIGH (ref 70–99)

## 2013-07-01 NOTE — Assessment & Plan Note (Addendum)
In remission. Continue current dose of prednisone 10 mg qd as she has flared when lower dose used. Looking into Entyvio or Cimzia at hospital in 2015 - she cannot afford higher cost insurance so biologic coverage probably not going to improve for her though may be able to afford meds via infusion center RTC Feb/Mar 2015 Vit D and CRP today

## 2013-07-01 NOTE — Assessment & Plan Note (Signed)
Check vit D and glcuose today

## 2013-07-01 NOTE — Progress Notes (Signed)
Subjective:    Patient ID: [name] [name], female    DOB: 1941/02/16, 72 y.o.   MRN: [id number]  HPI Patient returns for followup of her left-sided Crohn's colitis. She has either been intolerant of, refractory to immunomodulators and Remicade. She has been on prednisone. She is now on 10 mg daily and reports that she is doing very well. She would like to reduce the dose of prednisone if possible. She is not having diarrhea or rectal bleeding. No significant abdominal pain problems. She does report an occasional cramp, and she uses Lomotil once or twice a month. Allergies  Allergen Reactions  . Azathioprine Nausea Only    Elevated lipase also - ? Pancreatitis   . Adhesive [Tape] Rash    Taking skin off  . Sulfa Antibiotics Itching and Rash   Outpatient Prescriptions Prior to Visit  Medication Sig Dispense Refill  . atenolol (TENORMIN) 25 MG tablet Take 25 mg by mouth 2 (two) times daily.      Marland Kitchen b complex vitamins capsule Take 1 capsule by mouth daily.        . Calcium Carbonate-Vit D-Min (CALCIUM 1200) 1200-1000 MG-UNIT CHEW Take 1 chew by mouth once daily  1 each  0  . cetirizine (ZYRTEC) 10 MG tablet Take 10 mg by mouth daily as needed. allergies      . Cholecalciferol (VITAMIN D) 2000 UNITS CAPS Take 1 capsule (2,000 Units total) by mouth daily.  1 capsule  0  . dicyclomine (BENTYL) 20 MG tablet Take 1 tablet (20 mg total) by mouth 3 (three) times daily. For stomach pain  90 tablet  5  . diphenoxylate-atropine (LOMOTIL) 2.5-0.025 MG per tablet TAKE 1 TO 2 TABLETS EVERY 4 HOURS AS NEEDED FOR DIARRHEA  90 tablet  0  . Multiple Vitamins-Minerals (MULTIVITAMINS THER. W/MINERALS) TABS Take 1 tablet by mouth daily.        . Omega-3 Fatty Acids (FISH OIL) 1000 MG CAPS Take 1,000 mg by mouth daily.        . sucralfate (CARAFATE) 1 G tablet Take 1 g by mouth 4 (four) times daily as needed.      . traMADol (ULTRAM) 50 MG tablet Take 50 mg by mouth 2 (two) times daily as needed. For pain.  Maximum dose= 8 tablets per day      . predniSONE (DELTASONE) 10 MG tablet TAKE 3 TABLETS EVERY DAY X 1 WEEK THEN TAKE 2 TABLETS EVERY DAY  100 tablet  1   No facility-administered medications prior to visit.   Past Medical History  Diagnosis Date  . Crohn's colitis   . Hypertension   . Colon polyps     adenomatous polyp June 2012  . Complication of anesthesia     slow to wake  . Shortness of breath   . Blood transfusion     1966  . Headache(784.0)     hx migraines  . Arthritis     shoulder  . Bell's palsy   . GERD (gastroesophageal reflux disease)   . Dry eyes     left eye worst one   Past Surgical History  Procedure Laterality Date  . Cholecystectomy    . Abdominal hysterectomy    . Tonsillectomy    . Shoulder surgery      right  . Foot ganglion excision      left  . Appendectomy    . Flexible sigmoidoscopy  06/08/2011    severe Crohn's colitis to descending colon at  least  . Colonoscopy  multiple  . Flex sigmoidoscopy with biospy  01/17/12  . Catarac surgery      May 1 and May 27   History   Social History  . Marital Status: Widowed    Spouse Name: N/A    Number of Children: 3  . Years of Education: N/A   Occupational History  . retired     Social History Main Topics  . Smoking status: Former Smoker    Types: Cigarettes    Quit date: 09/26/1968  . Smokeless tobacco: Never Used  . Alcohol Use: No     Comment: rare wine  . Drug Use: No  . Sexual Activity: No   Review of Systems  recent sinus infection treated with antibiotics    Objective:   Physical Exam General:  NAD, slightlycushingoid Eyes:   anicteric Lungs:  clear Heart:  S1S2 no rubs, murmurs or gallops Abdomen:  soft and nontender, BS+ Ext:   no edema     Assessment & Plan:   1. Crohn's colitis   2. Long term current use of systemic steroids-prednisone

## 2013-07-01 NOTE — Patient Instructions (Addendum)
We would like to see you back end of February/early March 2015.   Your physician has requested that you go to the basement for the following lab work before leaving today: Vitamin D, C-Reactive Protein, glucose   I appreciate the opportunity to care for you.

## 2013-07-02 LAB — VITAMIN D 25 HYDROXY (VIT D DEFICIENCY, FRACTURES): Vit D, 25-Hydroxy: 36 ng/mL (ref 30–89)

## 2013-07-02 NOTE — Progress Notes (Signed)
Quick Note:  See if they can add a Hgb A1 c for hyperglycemia ______

## 2013-07-03 ENCOUNTER — Telehealth: Payer: Self-pay

## 2013-07-03 DIAGNOSIS — R7309 Other abnormal glucose: Secondary | ICD-10-CM

## 2013-07-03 NOTE — Telephone Encounter (Signed)
See results from 07/01/13 Glucose.  The lab was not able to add on HgbA1c.  She will come one day next week

## 2013-07-06 ENCOUNTER — Other Ambulatory Visit (INDEPENDENT_AMBULATORY_CARE_PROVIDER_SITE_OTHER): Payer: Medicare Other

## 2013-07-06 DIAGNOSIS — R7309 Other abnormal glucose: Secondary | ICD-10-CM

## 2013-07-06 LAB — HEMOGLOBIN A1C: Hgb A1c MFr Bld: 5.8 % (ref 4.6–6.5)

## 2013-07-06 NOTE — Addendum Note (Signed)
Addended by: Larina Bras on: 07/06/2013 10:03 AM   Modules accepted: Orders

## 2013-08-07 DIAGNOSIS — H16229 Keratoconjunctivitis sicca, not specified as Sjogren's, unspecified eye: Secondary | ICD-10-CM | POA: Diagnosis not present

## 2013-08-20 DIAGNOSIS — Z79899 Other long term (current) drug therapy: Secondary | ICD-10-CM | POA: Diagnosis not present

## 2013-08-20 DIAGNOSIS — M25519 Pain in unspecified shoulder: Secondary | ICD-10-CM | POA: Diagnosis not present

## 2013-08-20 DIAGNOSIS — I10 Essential (primary) hypertension: Secondary | ICD-10-CM | POA: Diagnosis not present

## 2013-08-20 DIAGNOSIS — E749 Disorder of carbohydrate metabolism, unspecified: Secondary | ICD-10-CM | POA: Diagnosis not present

## 2013-09-11 DIAGNOSIS — H40039 Anatomical narrow angle, unspecified eye: Secondary | ICD-10-CM | POA: Diagnosis not present

## 2013-09-11 DIAGNOSIS — H1045 Other chronic allergic conjunctivitis: Secondary | ICD-10-CM | POA: Diagnosis not present

## 2013-09-16 ENCOUNTER — Ambulatory Visit (INDEPENDENT_AMBULATORY_CARE_PROVIDER_SITE_OTHER): Payer: Medicare Other | Admitting: Internal Medicine

## 2013-09-16 ENCOUNTER — Encounter: Payer: Self-pay | Admitting: Internal Medicine

## 2013-09-16 VITALS — BP 122/80 | HR 76 | Ht 65.0 in | Wt 187.2 lb

## 2013-09-16 DIAGNOSIS — K5010 Crohn's disease of large intestine without complications: Secondary | ICD-10-CM | POA: Diagnosis not present

## 2013-09-16 DIAGNOSIS — IMO0002 Reserved for concepts with insufficient information to code with codable children: Secondary | ICD-10-CM

## 2013-09-16 NOTE — Progress Notes (Signed)
         Subjective:    Patient ID: [name] [name], female    DOB: 1940/11/28, 73 y.o.   MRN: [id number]  HPI Doing well - no c/o at all Planning for trip to each next month Does not want to change anything right now. Medications, allergies, past medical history, past surgical history, family history and social history are reviewed and updated in the EMR.  Review of Systems     Objective:   Physical Exam General:  NAD - mildly Cushingoid Eyes:   anicteric Lungs:  clear Heart:  S1S2 no rubs, murmurs or gallops Abdomen:  soft and nontender, BS+ Ext:   no edema    Data Reviewed:  Wt Readings from Last 3 Encounters:  09/16/13 187 lb 3.2 oz (84.913 kg)  07/01/13 187 lb 6.4 oz (85.004 kg)  02/19/13 186 lb (84.369 kg)       Assessment & Plan:  Crohn's colitis - left -sided  Chronic prednisone use  Current outpatient prescriptions:atenolol (TENORMIN) 25 MG tablet, Take 25 mg by mouth 2 (two) times daily., Disp: , Rfl: ;  b complex vitamins capsule, Take 1 capsule by mouth daily.  , Disp: , Rfl: ;  Calcium Carbonate-Vit D-Min (CALCIUM 1200) 1200-1000 MG-UNIT CHEW, Take 1 chew by mouth once daily, Disp: 1 each, Rfl: 0;  carboxymethylcellulose (REFRESH PLUS) 0.5 % SOLN, 1 drop 3 (three) times daily as needed., Disp: , Rfl:  cetirizine (ZYRTEC) 10 MG tablet, Take 10 mg by mouth daily as needed. allergies, Disp: , Rfl: ;  Cholecalciferol (VITAMIN D) 2000 UNITS CAPS, Take 1 capsule (2,000 Units total) by mouth daily., Disp: 1 capsule, Rfl: 0;  cycloSPORINE (RESTASIS) 0.05 % ophthalmic emulsion, 1 drop 2 (two) times daily., Disp: , Rfl:  dicyclomine (BENTYL) 20 MG tablet, Take 1 tablet (20 mg total) by mouth 3 (three) times daily. For stomach pain, Disp: 90 tablet, Rfl: 5;  diphenoxylate-atropine (LOMOTIL) 2.5-0.025 MG per tablet, TAKE 1 TO 2 TABLETS EVERY 4 HOURS AS NEEDED FOR DIARRHEA, Disp: 90 tablet, Rfl: 0;  Eyelid Cleansers (SYSTANE LID WIPES) PADS, Apply topically., Disp: , Rfl: ;   Hypromellose 0.3 % SOLN, Apply to eye., Disp: , Rfl:  Multiple Vitamins-Minerals (MULTIVITAMINS THER. W/MINERALS) TABS, Take 1 tablet by mouth daily.  , Disp: , Rfl: ;  Omega-3 Fatty Acids (FISH OIL) 1000 MG CAPS, Take 1,000 mg by mouth daily.  , Disp: , Rfl: ;  predniSONE (DELTASONE) 10 MG tablet, one tablet daily, Disp: , Rfl: ;  sucralfate (CARAFATE) 1 G tablet, Take 1 g by mouth 4 (four) times daily as needed., Disp: , Rfl:  traMADol (ULTRAM) 50 MG tablet, Take 50 mg by mouth 2 (two) times daily as needed. For pain. Maximum dose= 8 tablets per day, Disp: , Rfl: ;  fluorometholone (FML) 0.1 % ophthalmic suspension, , Disp: , Rfl: ;  hydrochlorothiazide (HYDRODIURIL) 25 MG tablet, , Disp: , Rfl:

## 2013-09-16 NOTE — Assessment & Plan Note (Signed)
Stable at 10 mg prednisone daily Continue that Consider Entyvio when that becomes available

## 2013-09-16 NOTE — Patient Instructions (Signed)
Glad your doing well.  Follow up with [place] in 6 months.   I appreciate the opportunity to care for you.

## 2013-09-16 NOTE — Assessment & Plan Note (Signed)
On lowest dose she can handle at this time

## 2013-10-05 ENCOUNTER — Other Ambulatory Visit: Payer: Self-pay | Admitting: Internal Medicine

## 2013-11-23 DIAGNOSIS — H43399 Other vitreous opacities, unspecified eye: Secondary | ICD-10-CM | POA: Diagnosis not present

## 2013-11-23 DIAGNOSIS — H26499 Other secondary cataract, unspecified eye: Secondary | ICD-10-CM | POA: Diagnosis not present

## 2013-12-04 DIAGNOSIS — H26499 Other secondary cataract, unspecified eye: Secondary | ICD-10-CM | POA: Diagnosis not present

## 2013-12-30 DIAGNOSIS — IMO0002 Reserved for concepts with insufficient information to code with codable children: Secondary | ICD-10-CM | POA: Diagnosis not present

## 2014-01-05 DIAGNOSIS — R05 Cough: Secondary | ICD-10-CM | POA: Diagnosis not present

## 2014-01-05 DIAGNOSIS — R059 Cough, unspecified: Secondary | ICD-10-CM | POA: Diagnosis not present

## 2014-01-05 DIAGNOSIS — J019 Acute sinusitis, unspecified: Secondary | ICD-10-CM | POA: Diagnosis not present

## 2014-02-19 DIAGNOSIS — R05 Cough: Secondary | ICD-10-CM | POA: Diagnosis not present

## 2014-02-19 DIAGNOSIS — J019 Acute sinusitis, unspecified: Secondary | ICD-10-CM | POA: Diagnosis not present

## 2014-02-19 DIAGNOSIS — R059 Cough, unspecified: Secondary | ICD-10-CM | POA: Diagnosis not present

## 2014-03-11 ENCOUNTER — Telehealth: Payer: Self-pay | Admitting: Cardiology

## 2014-03-11 DIAGNOSIS — K219 Gastro-esophageal reflux disease without esophagitis: Secondary | ICD-10-CM | POA: Diagnosis not present

## 2014-03-11 DIAGNOSIS — Z0000 Encounter for general adult medical examination without abnormal findings: Secondary | ICD-10-CM | POA: Diagnosis not present

## 2014-03-11 DIAGNOSIS — I498 Other specified cardiac arrhythmias: Secondary | ICD-10-CM | POA: Diagnosis not present

## 2014-03-11 DIAGNOSIS — R5381 Other malaise: Secondary | ICD-10-CM | POA: Diagnosis not present

## 2014-03-11 DIAGNOSIS — Z79899 Other long term (current) drug therapy: Secondary | ICD-10-CM | POA: Diagnosis not present

## 2014-03-11 DIAGNOSIS — I10 Essential (primary) hypertension: Secondary | ICD-10-CM | POA: Diagnosis not present

## 2014-03-11 DIAGNOSIS — R5383 Other fatigue: Secondary | ICD-10-CM | POA: Diagnosis not present

## 2014-03-11 DIAGNOSIS — E559 Vitamin D deficiency, unspecified: Secondary | ICD-10-CM | POA: Diagnosis not present

## 2014-03-11 DIAGNOSIS — E539 Vitamin B deficiency, unspecified: Secondary | ICD-10-CM | POA: Diagnosis not present

## 2014-03-11 NOTE — Telephone Encounter (Signed)
Michelle(nurse) called in stating that the pt was in for a CPE this morning and had a abnormal EKG and she would like for you to look at it and give the office a call back for some medical input. She will be faxing over EKG. Please call  Thanks

## 2014-03-11 NOTE — Telephone Encounter (Signed)
Received EKG from [facility] 03/11/14.Dr.Jordan reviewed EKG no change from previous 09/13/11 EKG.

## 2014-03-11 NOTE — Telephone Encounter (Signed)
Forward to DR JORDAN/CHERYL [facility] LPN/

## 2014-03-18 DIAGNOSIS — I10 Essential (primary) hypertension: Secondary | ICD-10-CM | POA: Diagnosis not present

## 2014-03-24 ENCOUNTER — Ambulatory Visit (INDEPENDENT_AMBULATORY_CARE_PROVIDER_SITE_OTHER): Payer: Medicare Other | Admitting: Internal Medicine

## 2014-03-24 ENCOUNTER — Ambulatory Visit (INDEPENDENT_AMBULATORY_CARE_PROVIDER_SITE_OTHER)
Admission: RE | Admit: 2014-03-24 | Discharge: 2014-03-24 | Disposition: A | Discharge status: Home / Self Care | Payer: Medicare Other | Source: Ambulatory Visit | Attending: Internal Medicine | Admitting: Internal Medicine

## 2014-03-24 ENCOUNTER — Encounter: Payer: Self-pay | Admitting: Internal Medicine

## 2014-03-24 VITALS — BP 132/78 | HR 64 | Ht 65.0 in | Wt 188.2 lb

## 2014-03-24 DIAGNOSIS — M899 Disorder of bone, unspecified: Secondary | ICD-10-CM

## 2014-03-24 DIAGNOSIS — M949 Disorder of cartilage, unspecified: Secondary | ICD-10-CM

## 2014-03-24 DIAGNOSIS — M8580 Other specified disorders of bone density and structure, unspecified site: Secondary | ICD-10-CM

## 2014-03-24 DIAGNOSIS — K50111 Crohn's disease of large intestine with rectal bleeding: Secondary | ICD-10-CM

## 2014-03-24 DIAGNOSIS — IMO0002 Reserved for concepts with insufficient information to code with codable children: Secondary | ICD-10-CM | POA: Diagnosis not present

## 2014-03-24 DIAGNOSIS — K5010 Crohn's disease of large intestine without complications: Secondary | ICD-10-CM | POA: Diagnosis not present

## 2014-03-24 MED ORDER — PREDNISONE 2.5 MG PO TABS: 7.5000 mg | ORAL_TABLET | Freq: Every day | ORAL | Status: DC

## 2014-03-24 NOTE — Assessment & Plan Note (Signed)
Recheck DEXA

## 2014-03-24 NOTE — Progress Notes (Signed)
   Subjective:    Patient ID: [place], female    DOB: April 08, 1941, 73 y.o.   MRN: [id number]  HPI [name] [name] is here for f/u - doing well no diarrhea or bleeding. Would like to reduce prednisone. Did have some crampy LQ pain after greens last week.  Medications, allergies, past medical history, past surgical history, family history and social history are reviewed and updated in the EMR.  Review of Systems As above, BP meds changed lately    Objective:   Physical Exam General:  NAD, mildly cushingoid Eyes:   anicteric Lungs:  clear Heart:  S1S2 no rubs, murmurs or gallops Abdomen:  soft and nontender, BS+ Ext:   no edema    Data Reviewed:  Will request labs at PCP   Assessment & Plan:  Crohn's colitis - left -sided Reduce to 7.5 mg daily  Look into biologics again - she will see what $ are for Humira, Cimzia and Entyvio - would like to get off steroids  Chronic prednisone use Recheck DEXA  Osteopenia Recheck DEXA    DL:OPRA,FOADLK F, NP

## 2014-03-24 NOTE — Patient Instructions (Addendum)
Please check on the following medicines:  HUMIRA, CIMZIA, AND ENTIVYO  And let us know about cost for you.  We will have you go to the basement today and get a DEXA scan.  We are going to get your lab work from the past 6 months for Dr. Carlean Purl to review from Heide Scales, NP-C's office.  We have sent the following medications to your pharmacy for you to pick up at your convenience: Prednisone, take as directed  Follow up with [place] in 6 months.   I appreciate the opportunity to care for you.

## 2014-03-24 NOTE — Assessment & Plan Note (Addendum)
Reduce to 7.5 mg daily  Look into biologics again - she will see what $ are for Humira, Cimzia and Entyvio - would like to get off steroids

## 2014-03-25 DIAGNOSIS — I10 Essential (primary) hypertension: Secondary | ICD-10-CM | POA: Diagnosis not present

## 2014-04-01 ENCOUNTER — Telehealth: Payer: Self-pay | Admitting: Internal Medicine

## 2014-04-01 DIAGNOSIS — I10 Essential (primary) hypertension: Secondary | ICD-10-CM | POA: Diagnosis not present

## 2014-04-01 NOTE — Telephone Encounter (Signed)
I have explained to the patient the Ambassador program for Humira.  I explained they can work with her on programs that can help her afford Humira.  I have mailed her an application. She will fill it out and return it to me.

## 2014-04-01 NOTE — Progress Notes (Signed)
Quick Note:  Stay on calcium and vitamin D for osteopenia and prednisone use Please see if she can get started with Humira ambassador program to see if she can qualify for help or at least find best Medicare part D plan for 2016 - so Humira cheaper ______

## 2014-04-14 DIAGNOSIS — H6500 Acute serous otitis media, unspecified ear: Secondary | ICD-10-CM | POA: Diagnosis not present

## 2014-04-14 DIAGNOSIS — R05 Cough: Secondary | ICD-10-CM | POA: Diagnosis not present

## 2014-04-14 DIAGNOSIS — R059 Cough, unspecified: Secondary | ICD-10-CM | POA: Diagnosis not present

## 2014-05-03 DIAGNOSIS — G44019 Episodic cluster headache, not intractable: Secondary | ICD-10-CM | POA: Diagnosis not present

## 2014-05-03 DIAGNOSIS — J387 Other diseases of larynx: Secondary | ICD-10-CM | POA: Diagnosis not present

## 2014-05-03 DIAGNOSIS — R0982 Postnasal drip: Secondary | ICD-10-CM | POA: Diagnosis not present

## 2014-05-05 ENCOUNTER — Telehealth: Payer: Self-pay | Admitting: Internal Medicine

## 2014-05-05 NOTE — Telephone Encounter (Signed)
Patient with rectal bleeding she has had this since decreasing prednisone to 7.5 mg.  She is also c/o reflux.  She is advised to try Omeprazole daily for this.  Please advise on bleeding

## 2014-05-05 NOTE — Telephone Encounter (Signed)
Will work on [employer]

## 2014-05-05 NOTE — Telephone Encounter (Signed)
Prednisone 20 mg daily x 1 week, 15 mg daily x 1 week then 10 mg daily  Please look into Cimzia at hospital for her - Cimzia rep says that can happen for Medicare patients now - you may want to ask him who to speak to

## 2014-05-05 NOTE — Telephone Encounter (Signed)
Patient notified

## 2014-05-07 NOTE — Telephone Encounter (Signed)
Per Rob from [employer] might be able to get approved at [facility]. He recommended that we run the benefits through [employer] program.  Forms faxed.

## 2014-05-10 ENCOUNTER — Telehealth: Payer: Self-pay | Admitting: Internal Medicine

## 2014-05-10 NOTE — Telephone Encounter (Signed)
Patient notified of denial.  She is working on getting another plan for January.  She is not sure if it will be any better, but she is working on it

## 2014-05-10 NOTE — Telephone Encounter (Signed)
Dr. Carlean Purl,  Magn[employer] Sinning has been denied do you want them to run it for another med?

## 2014-05-10 NOTE — Telephone Encounter (Signed)
That has been too expensive for her in past so if she is going to change Rx insurance would wait til 2016

## 2014-05-27 DIAGNOSIS — Z23 Encounter for immunization: Secondary | ICD-10-CM | POA: Diagnosis not present

## 2014-06-02 DIAGNOSIS — M5432 Sciatica, left side: Secondary | ICD-10-CM | POA: Diagnosis not present

## 2014-06-02 DIAGNOSIS — M25552 Pain in left hip: Secondary | ICD-10-CM | POA: Diagnosis not present

## 2014-06-02 DIAGNOSIS — M7062 Trochanteric bursitis, left hip: Secondary | ICD-10-CM | POA: Diagnosis not present

## 2014-06-21 DIAGNOSIS — M47817 Spondylosis without myelopathy or radiculopathy, lumbosacral region: Secondary | ICD-10-CM | POA: Diagnosis not present

## 2014-06-21 DIAGNOSIS — M4806 Spinal stenosis, lumbar region: Secondary | ICD-10-CM | POA: Diagnosis not present

## 2014-06-21 DIAGNOSIS — M5116 Intervertebral disc disorders with radiculopathy, lumbar region: Secondary | ICD-10-CM | POA: Diagnosis not present

## 2014-06-22 ENCOUNTER — Other Ambulatory Visit: Payer: Self-pay | Admitting: Internal Medicine

## 2014-06-22 NOTE — Telephone Encounter (Signed)
Spoke with patient and confirmed what she is taking.  She has been on prednisone 80m for the past 3 weeks for a recent flare. Per Dr GCarlean Purlstay on 128mone a day.  I sent in the refill and made her a follow up appointment for March 7th 2016.

## 2014-06-22 NOTE — Telephone Encounter (Signed)
OK top refill  She may want 5 mg tabs as I think she is taking 7.5 mg daily (1.5 tabs daily) # 1 month supply with 4 RF

## 2014-06-22 NOTE — Telephone Encounter (Signed)
May I refill Sir?

## 2014-08-06 ENCOUNTER — Telehealth: Payer: Self-pay | Admitting: Internal Medicine

## 2014-08-06 ENCOUNTER — Encounter: Payer: Self-pay | Admitting: Gastroenterology

## 2014-08-06 MED ORDER — DIPHENOXYLATE-ATROPINE 2.5-0.025 MG PO TABS: ORAL_TABLET | ORAL | Status: DC

## 2014-08-06 NOTE — Telephone Encounter (Signed)
Ok x 1 year

## 2014-08-06 NOTE — Telephone Encounter (Signed)
Error

## 2014-08-06 NOTE — Telephone Encounter (Signed)
May I refill Sir?

## 2014-08-06 NOTE — Telephone Encounter (Signed)
Ref[facility] to [facility] as requested.

## 2014-08-09 ENCOUNTER — Telehealth: Payer: Self-pay | Admitting: Internal Medicine

## 2014-08-09 MED ORDER — DICYCLOMINE HCL 20 MG PO TABS: 20.0000 mg | ORAL_TABLET | Freq: Three times a day (TID) | ORAL | Status: DC

## 2014-08-09 NOTE — Telephone Encounter (Signed)
Spoke with patient and apologized that wrong rx was sent in.  They are on top of each other on the medicine list and the wrong one got clicked.  Refilled correct rx while we were on the phone.

## 2014-09-13 ENCOUNTER — Telehealth: Payer: Self-pay | Admitting: Internal Medicine

## 2014-09-13 NOTE — Telephone Encounter (Signed)
Patient reports that she has a 2 week history of rectal bleeding, some right sided abdominal pain, and nausea.  She is currently on prednisone 10 mg. Her primary care MD stopped her tramadol for her back and hip and is having a lot of pain since then in her back and hip.  She has follow up with you on 09/27/14.  Does she need to make any changes until office visit on 3/7?

## 2014-09-14 MED ORDER — PREDNISONE 10 MG PO TABS: 10.0000 mg | ORAL_TABLET | Freq: Every day | ORAL | Status: DC

## 2014-09-14 NOTE — Telephone Encounter (Signed)
Patient notified She is asked to keep the appt for 09/27/14

## 2014-09-14 NOTE — Telephone Encounter (Signed)
Go to 20 mg daily on the prednisone

## 2014-09-20 ENCOUNTER — Telehealth: Payer: Self-pay | Admitting: Internal Medicine

## 2014-09-20 NOTE — Telephone Encounter (Signed)
Patient reports worsening abdominal pain, rectal bleeding, right side pain, and  nausea.  Triage from last week her prednisone was increase.  She will come in and see Alonza Bogus, PA tomorrow at 1:30

## 2014-09-21 ENCOUNTER — Other Ambulatory Visit (INDEPENDENT_AMBULATORY_CARE_PROVIDER_SITE_OTHER): Payer: Medicare Other

## 2014-09-21 ENCOUNTER — Encounter: Payer: Self-pay | Admitting: Gastroenterology

## 2014-09-21 ENCOUNTER — Ambulatory Visit (INDEPENDENT_AMBULATORY_CARE_PROVIDER_SITE_OTHER): Payer: Medicare Other | Admitting: Gastroenterology

## 2014-09-21 VITALS — BP 164/84 | HR 86 | Ht 66.0 in | Wt 188.0 lb

## 2014-09-21 DIAGNOSIS — K5090 Crohn's disease, unspecified, without complications: Secondary | ICD-10-CM

## 2014-09-21 DIAGNOSIS — K219 Gastro-esophageal reflux disease without esophagitis: Secondary | ICD-10-CM

## 2014-09-21 DIAGNOSIS — R110 Nausea: Secondary | ICD-10-CM

## 2014-09-21 DIAGNOSIS — R109 Unspecified abdominal pain: Secondary | ICD-10-CM | POA: Diagnosis not present

## 2014-09-21 LAB — CBC WITH DIFFERENTIAL/PLATELET
Basophils Absolute: 0 10*3/uL (ref 0.0–0.1)
Basophils Relative: 0.1 % (ref 0.0–3.0)
EOS ABS: 0 10*3/uL (ref 0.0–0.7)
EOS PCT: 0 % (ref 0.0–5.0)
HEMATOCRIT: 46.9 % — AB (ref 36.0–46.0)
Hemoglobin: 15.8 g/dL — ABNORMAL HIGH (ref 12.0–15.0)
Lymphocytes Relative: 4.5 % — ABNORMAL LOW (ref 12.0–46.0)
Lymphs Abs: 0.8 10*3/uL (ref 0.7–4.0)
MCHC: 33.7 g/dL (ref 30.0–36.0)
MCV: 96 fl (ref 78.0–100.0)
MONO ABS: 0.2 10*3/uL (ref 0.1–1.0)
Monocytes Relative: 1.3 % — ABNORMAL LOW (ref 3.0–12.0)
Neutro Abs: 17.3 10*3/uL — ABNORMAL HIGH (ref 1.4–7.7)
Neutrophils Relative %: 94.1 % — ABNORMAL HIGH (ref 43.0–77.0)
PLATELETS: 468 10*3/uL — AB (ref 150.0–400.0)
RBC: 4.89 Mil/uL (ref 3.87–5.11)
RDW: 13.1 % (ref 11.5–15.5)
WBC: 18.4 10*3/uL (ref 4.0–10.5)

## 2014-09-21 LAB — COMPREHENSIVE METABOLIC PANEL
ALBUMIN: 4 g/dL (ref 3.5–5.2)
ALT: 20 U/L (ref 0–35)
AST: 23 U/L (ref 0–37)
Alkaline Phosphatase: 82 U/L (ref 39–117)
BILIRUBIN TOTAL: 0.3 mg/dL (ref 0.2–1.2)
BUN: 15 mg/dL (ref 6–23)
CO2: 31 mEq/L (ref 19–32)
Calcium: 9.8 mg/dL (ref 8.4–10.5)
Chloride: 97 mEq/L (ref 96–112)
Creatinine, Ser: 1.1 mg/dL (ref 0.40–1.20)
GFR: 51.76 mL/min — AB (ref >60.00)
GLUCOSE: 151 mg/dL — AB (ref 70–99)
Potassium: 4.7 mEq/L (ref 3.5–5.1)
SODIUM: 133 meq/L — AB (ref 135–145)
TOTAL PROTEIN: 8 g/dL (ref 6.0–8.3)

## 2014-09-21 MED ORDER — PANTOPRAZOLE SODIUM 40 MG PO TBEC: 40.0000 mg | DELAYED_RELEASE_TABLET | Freq: Every day | ORAL | Status: DC

## 2014-09-21 MED ORDER — ONDANSETRON HCL 4 MG PO TABS
4.0000 mg | ORAL_TABLET | Freq: Three times a day (TID) | ORAL | Status: DC | PRN
Start: 2014-09-21 — End: 2015-04-01

## 2014-09-21 NOTE — Patient Instructions (Addendum)
Keep appointment as scheduled with Dr Carlean Purl on 09/27/2014  Go to the basement for labs today We are sending in Pantoprazole to your pharmacy today Increase your prednisone to 30 mg daily

## 2014-09-22 DIAGNOSIS — K219 Gastro-esophageal reflux disease without esophagitis: Secondary | ICD-10-CM | POA: Insufficient documentation

## 2014-09-22 DIAGNOSIS — R109 Unspecified abdominal pain: Secondary | ICD-10-CM | POA: Insufficient documentation

## 2014-09-22 DIAGNOSIS — R110 Nausea: Secondary | ICD-10-CM | POA: Insufficient documentation

## 2014-09-22 NOTE — Progress Notes (Addendum)
     09/22/2014 [place] [id number] February 04, 1941   History of Present Illness:  This is a 74 year old female who is known to Dr. Carlean Purl for treatment of her Crohn's colitis.  She has been on several treatment regimens but has been unable to tolerate many of them.  Has antibodies to Remicade.  She says that prednisone is the only thing that works and she has been maintained on this for a while.  Other biologics, etc have been discussed with her but she says that they are not covered by her insurance.  Most recently she had been on prednisone 10 mg daily.  She called our office last week complaining of abdominal pain, mild rectal bleeding, and nausea; her prednisone was increased to 20 mg daily at that time (on 2/23).  She is here today with ongoing complaints of abdominal pain.  Pain is in lower abdomen and along her right side.  She says that it "feels like Crohn's pain".  She's had some very small amounts of rectal bleeding.  No increase in frequency of diarrhea/stools; uses lomotil prn for the diarrhea.  No fevers.  Complains of nausea as well, which is almost the past bothersome for her.  She is taking her dicyclomine 20 mg TID since her pain began, but does not really seem to be helping.  She has an appt with Dr. Carlean Purl next week.  Also complains of reflux.  She is not on any medication for this issue.   Current Medications, Allergies, Past Medical History, Past Surgical History, Family History and Social History were reviewed in [employer] record.   Physical Exam: BP 164/84 mmHg  Pulse 86  Ht 5' 6"  (1.676 m)  Wt 188 lb (85.276 kg)  BMI 30.36 kg/m2 General: Well developed white female in no acute distress Head: Normocephalic and atraumatic Eyes:  Sclerae anicteric, conjunctiva pink  Ears: Normal auditory acuity Lungs: Clear throughout to auscultation Heart: Regular rate and rhythm Abdomen: Soft, non-distended.  Normal bowel sounds.  Mild right sided TTP  without R/R/G; abdomen benign. Musculoskeletal: Symmetrical with no gross deformities  Extremities: No edema  Neurological: Alert oriented x 4, grossly non-focal Psychological:  Alert and cooperative. Normal mood and affect  Assessment and Recommendations: -Crohn's colitis with possible flare resulting in abdominal pain:  Prednisone increased to 20 mg daily last week with no great improvement in symptoms so far.  She has an appt with Dr. Carlean Purl next week, which she will keep.  Will increase prednisone to 30 mg daily for now.  We will check a CBC and CMP today.  Will give zofran to use prn for nausea.  -GERD:  Complaining also of reflux.  Not on treatment.  Will start pantoprazole 40 mg daily.

## 2014-09-22 NOTE — Progress Notes (Signed)
Agree with Ms. Zehr's management.  Liahna Brickner E. Johnnathan Hagemeister, MD, FACG  

## 2014-09-27 ENCOUNTER — Ambulatory Visit: Payer: Medicare Other | Admitting: Internal Medicine

## 2014-09-28 ENCOUNTER — Telehealth: Payer: Self-pay | Admitting: Internal Medicine

## 2014-09-28 NOTE — Telephone Encounter (Signed)
Patient is rescheduled for 10/11/14 2:15 with the patient

## 2014-09-30 ENCOUNTER — Other Ambulatory Visit: Payer: Self-pay | Admitting: Internal Medicine

## 2014-09-30 NOTE — Telephone Encounter (Signed)
Sir she saw Janett Billow 09/21/14 and prednisone was increased to 60m daily.  She has appointment with you 10/11/14.  May I refill and do you want me to change directions or put take as directed Sir. Thank you.

## 2014-09-30 NOTE — Telephone Encounter (Signed)
Refill 10 mg tabs take as directed # 100 1 RF

## 2014-09-30 NOTE — Telephone Encounter (Signed)
[name] [name] is feeling better.  She is taking 5m of Prednisone daily.  She will see Dr GCarlean Purl3/21/16.

## 2014-09-30 NOTE — Telephone Encounter (Signed)
OK to refill Ask her what dose she is on? How is she doing? When is she seeing me again?

## 2014-10-01 NOTE — Telephone Encounter (Signed)
I spoke with the pharmacy and put an additional refill on her prednisone as Dr. Carlean Purl instructed.

## 2014-10-11 ENCOUNTER — Encounter: Payer: Self-pay | Admitting: Internal Medicine

## 2014-10-11 ENCOUNTER — Ambulatory Visit (INDEPENDENT_AMBULATORY_CARE_PROVIDER_SITE_OTHER): Payer: Medicare Other | Admitting: Internal Medicine

## 2014-10-11 ENCOUNTER — Other Ambulatory Visit: Payer: Medicare Other

## 2014-10-11 VITALS — BP 160/74 | HR 64 | Ht 65.5 in | Wt 188.2 lb

## 2014-10-11 DIAGNOSIS — R739 Hyperglycemia, unspecified: Secondary | ICD-10-CM | POA: Insufficient documentation

## 2014-10-11 DIAGNOSIS — K50111 Crohn's disease of large intestine with rectal bleeding: Secondary | ICD-10-CM

## 2014-10-11 DIAGNOSIS — D899 Disorder involving the immune mechanism, unspecified: Secondary | ICD-10-CM

## 2014-10-11 DIAGNOSIS — Z7952 Long term (current) use of systemic steroids: Secondary | ICD-10-CM | POA: Diagnosis not present

## 2014-10-11 DIAGNOSIS — D849 Immunodeficiency, unspecified: Secondary | ICD-10-CM

## 2014-10-11 MED ORDER — PREDNISONE 10 MG PO TABS: 20.0000 mg | ORAL_TABLET | Freq: Every day | ORAL | Status: DC

## 2014-10-11 NOTE — Assessment & Plan Note (Signed)
This is related to prednisone use. She has at risk for developing diabetes. Another reason for reducing dose and trying to get her off prednisone completely.

## 2014-10-11 NOTE — Assessment & Plan Note (Addendum)
Her recent flare is improving with increased prednisone.  I plan to start Entyvio therapy, I'm hopeful since this is a Hospital infusion her cost will not be prohibitive as outpatient treatment with other Biologics has been. I reviewed the side effects risks benefits and indications of Entyvio and provided written information about that today.  PPD, Hep BS Ag and core Ab total will be checked in anticipation of starting therapy. Plan to see her in 3 months. Reduce prednisone to 20 mg daily and then in one week ago to 15 mg at doing well and she is to call me in 3 weeks. Sooner if needed. We'll plan taper from there.

## 2014-10-11 NOTE — Assessment & Plan Note (Signed)
Will taper her steroids more at this point but will also try to use biologic therapy. Hopefully we get her off of prednisone with that. I'm hopeful she will be able to afford Entyvio.

## 2014-10-11 NOTE — Assessment & Plan Note (Signed)
Plan to screen for TB and hepatitis B in anticipation of starting Entyvio therapy.

## 2014-10-11 NOTE — Progress Notes (Signed)
Subjective:  Cc: Crohn's colitis f/u  Patient ID: [name] [name], female    DOB: November 03, 1940, 74 y.o.   MRN: [id number] Chief complaint: Follow-up of Crohn's disease HPI [name] [name] is here for follow-up after she was seen for increasing abdominal pain and rectal bleeding and diarrhea. She had her prednisone increased and is now down on 30 mg daily. She is not having abdominal pain or significant diarrhea or bleeding at this point. She is again asking about getting off prednisone if possible. I reminded her that we have tried to prescribe Biologics in the past but they have been too expensive. I think mostly it's been the ones that can be self-administered. Medications, allergies, past medical history, past surgical history, family history and social history are reviewed and updated in the EMR.   Review of Systems Fragile skin and some purpura    Objective:   Physical Exam BP 160/74 mmHg  Pulse 64  Ht 5' 5.5" (1.664 m)  Wt 188 lb 4 oz (85.39 kg)  BMI 30.84 kg/m2 General:  NAD Eyes:   anicteric Lungs:  clear Heart:  S1S2 no rubs, murmurs or gallops Abdomen:  soft and nontender, BS+ Ext:   no edema Skin:   senile and or prednisone related purpura is seen in the upper extremities    Data Reviewed:  Most recent GI note by Alonza Bogus  CBC and comprehensive metabolic panel from March 1 showed a leukocytosis of 18,000 with left shift on steroids, blood glucose was 151 and sodium 133 otherwise normal.    Assessment & Plan:  Crohn's colitis - left -sided Her recent flare is improving with increased prednisone.  I plan to start Entyvio therapy, I'm hopeful since this is a Hospital infusion her cost will not be prohibitive as outpatient treatment with other Biologics has been. I reviewed the side effects risks benefits and indications of Entyvio and provided written information about that today.  PPD, Hep BS Ag and core Ab total will be checked in anticipation of starting therapy. Plan to see  her in 3 months. Reduce prednisone to 20 mg daily and then in one week ago to 15 mg at doing well and she is to call me in 3 weeks. Sooner if needed. We'll plan taper from there.    Immunosuppression - on chronic prednisone, hx of Remicade and azathioprine Plan to screen for TB and hepatitis B in anticipation of starting Entyvio therapy.   Long term current use of systemic steroids - prednisone Will taper her steroids more at this point but will also try to use biologic therapy. Hopefully we get her off of prednisone with that. I'm hopeful she will be able to afford Entyvio.   Hyperglycemia This is related to prednisone use. She has at risk for developing diabetes. Another reason for reducing dose and trying to get her off prednisone completely.    Current outpatient prescriptions:  .  b complex vitamins capsule, Take 1 capsule by mouth daily.  , Disp: , Rfl:  .  BYSTOLIC 5 MG tablet, Take 5 mg by mouth 2 (two) times daily. , Disp: , Rfl:  .  Calcium Carbonate-Vit D-Min (CALCIUM 1200) 1200-1000 MG-UNIT CHEW, Take 1 chew by mouth once daily, Disp: 1 each, Rfl: 0 .  carboxymethylcellulose (REFRESH PLUS) 0.5 % SOLN, 1 drop 3 (three) times daily as needed., Disp: , Rfl:  .  cetirizine (ZYRTEC) 10 MG tablet, Take 10 mg by mouth daily as needed. allergies, Disp: , Rfl:  .  Cholecalciferol (VITAMIN D) 2000 UNITS CAPS, Take 1 capsule (2,000 Units total) by mouth daily., Disp: 1 capsule, Rfl: 0 .  cycloSPORINE (RESTASIS) 0.05 % ophthalmic emulsion, 1 drop 2 (two) times daily., Disp: , Rfl:  .  dicyclomine (BENTYL) 20 MG tablet, Take 1 tablet (20 mg total) by mouth 3 (three) times daily. For stomach pain, Disp: 90 tablet, Rfl: 5 .  diphenoxylate-atropine (LOMOTIL) 2.5-0.025 MG per tablet, TAKE 1 TO 2 TABLETS EVERY 4 HOURS AS NEEDED FOR DIARRHEA, Disp: 90 tablet, Rfl: 11 .  hydrochlorothiazide (HYDRODIURIL) 25 MG tablet, , Disp: , Rfl:  .  Multiple Vitamins-Minerals (MULTIVITAMINS THER. W/MINERALS)  TABS, Take 1 tablet by mouth daily.  , Disp: , Rfl:  .  Omega-3 Fatty Acids (FISH OIL) 1000 MG CAPS, Take 1,000 mg by mouth daily.  , Disp: , Rfl:  .  ondansetron (ZOFRAN) 4 MG tablet, Take 1 tablet (4 mg total) by mouth every 8 (eight) hours as needed for nausea or vomiting., Disp: 30 tablet, Rfl: 1 .  pantoprazole (PROTONIX) 40 MG tablet, Take 1 tablet (40 mg total) by mouth daily., Disp: 30 tablet, Rfl: 3 .  predniSONE (DELTASONE) 10 MG tablet, Take 2 tablets (20 mg total) by mouth daily., Disp: 100 tablet, Rfl: 0   CC: [name] Riches, NP

## 2014-10-11 NOTE — Patient Instructions (Signed)
Today you have been given a PPD test, please come back Wednesday March 23rd at 3:00pm or after to have [place] read it.   Your physician has requested that you go to the basement for the following lab work before leaving today: Hepatitis testing  Decrease your prednisone to 44m daily then go to 128mdaily in a week if your doing ok.  After taking 1535mor 2 weeks call and let us [place] how she's doing.  Today we have given you information to read on Entyvio.  Follow up with us [place] 3 months.    I appreciate the opportunity to care for you. [name] [name].D., FACMedical City Las Colinas

## 2014-10-12 LAB — HEPATITIS B SURFACE ANTIGEN: Hepatitis B Surface Ag: NEGATIVE

## 2014-10-12 LAB — HEPATITIS B CORE ANTIBODY, TOTAL: Hep B Core Total Ab: NONREACTIVE

## 2014-10-13 LAB — TB SKIN TEST
Induration: 0 mm
TB Skin Test: NEGATIVE

## 2014-10-14 NOTE — Progress Notes (Signed)
Quick Note:  OK to start Entyvio - ______

## 2014-10-18 DIAGNOSIS — S40262A Insect bite (nonvenomous) of left shoulder, initial encounter: Secondary | ICD-10-CM | POA: Diagnosis not present

## 2014-10-20 NOTE — Progress Notes (Signed)
Quick Note:  She will need REV 3 months after starting Entyvio please ______

## 2014-10-21 ENCOUNTER — Telehealth: Payer: Self-pay | Admitting: Internal Medicine

## 2014-10-21 NOTE — Telephone Encounter (Signed)
Patient reports that she was doing fine on 15 mg of prednisone until she had a tick on her.  Her primary care started on doxycycline until 10/27/14 due to "bullseye" rash on her shoulder .  She is having upset stomach and now diarrhea.  I am still working with Weyman Rodney to verify her benefits and see if they will pay for it.  If they do approve it, when can she start?

## 2014-10-22 NOTE — Telephone Encounter (Signed)
Patient notified, she will have her primary care send over the labs and office note.  The tick was sent off to check for lyme disease

## 2014-10-22 NOTE — Telephone Encounter (Signed)
Please try to get the notes and any labs on this - sounds like they were treating Lyme disease I would hold off on Entyvio until we know more about that Doxycycline may be causing GI upset

## 2014-10-26 NOTE — Telephone Encounter (Signed)
[name] [name], CMA spoke with [name] [name] office they will send the labs when they all come back and office notes.

## 2014-10-28 NOTE — Telephone Encounter (Signed)
[facility] office again, notes are still not here

## 2014-10-29 ENCOUNTER — Other Ambulatory Visit: Payer: Self-pay

## 2014-10-29 DIAGNOSIS — K50118 Crohn's disease of large intestine with other complication: Secondary | ICD-10-CM

## 2014-10-29 NOTE — Telephone Encounter (Signed)
Notes from Heide Scales, FNP are in your office .  Tick was negative for Lyme's disease.  She has completed her antibiotics.  Do you want to start entyvio?

## 2014-10-29 NOTE — Telephone Encounter (Signed)
Entyvio scheduled at [facility] for 0, 2, 6 weeks then q 8 weeks 4/29 12:00 5/13 11:00 6/10 1:00 Left message for patient to call back

## 2014-10-29 NOTE — Telephone Encounter (Signed)
Start entyvio please

## 2014-11-01 NOTE — Telephone Encounter (Signed)
Patient notified of appt date and times. She will call for follow up in May to schedule an office visit for July

## 2014-11-18 ENCOUNTER — Telehealth: Payer: Self-pay | Admitting: Internal Medicine

## 2014-11-18 NOTE — Telephone Encounter (Signed)
Patient notified ok to eat prior to entyvio treatment

## 2014-11-19 ENCOUNTER — Encounter (HOSPITAL_COMMUNITY)
Admission: RE | Admit: 2014-11-19 | Discharge: 2014-11-19 | Disposition: A | Discharge status: Home / Self Care | Payer: Medicare Other | Source: Ambulatory Visit | Attending: Internal Medicine | Admitting: Internal Medicine

## 2014-11-19 ENCOUNTER — Encounter (HOSPITAL_COMMUNITY): Payer: Self-pay

## 2014-11-19 VITALS — BP 144/64 | HR 52 | Temp 98.1°F | Resp 18 | Ht 66.0 in | Wt 187.5 lb

## 2014-11-19 DIAGNOSIS — K50118 Crohn's disease of large intestine with other complication: Secondary | ICD-10-CM | POA: Insufficient documentation

## 2014-11-19 MED ORDER — VEDOLIZUMAB 300 MG IV SOLR
300.0000 mg | Freq: Once | INTRAVENOUS | Status: AC
  Administered 2014-11-19: 300 mg via INTRAVENOUS
  Filled 2014-11-19: qty 5

## 2014-11-19 MED ORDER — SODIUM CHLORIDE 0.9 % IV SOLN
INTRAVENOUS | Status: DC
  Administered 2014-11-19: 250 mL via INTRAVENOUS

## 2014-11-19 NOTE — Discharge Instructions (Signed)
ATTENTION:  If you are going to be 15 or more minutes late for your appointment, please call [phone] to make other arrangements for your treatment.  If you arrive early for your schedule appointment, you may have to wait until your scheduled time.Vedolizumab injection solution What is this medicine? VEDOLIZUMAB (Ve doe LIZ you mab) is used to treat ulcerative colitis and Crohn's disease in adult patients. This medicine may be used for other purposes; ask your health care provider or pharmacist if you have questions. COMMON BRAND NAME(S): Entyvio What should I tell my health care provider before I take this medicine? They need to know if you have any of these conditions: -diabetes -hepatitis B or history of hepatitis B infection -HIV or AIDS -immune system problems -infection or history of infections -liver disease -recently received or scheduled to receive a vaccine -scheduled to have surgery -tuberculosis, a positive skin test for tuberculosis or have recently been in close contact with someone who has tuberculosis - an unusual or allergic reaction to vedolizumab, other medicines, foods, dyes, or preservatives -pregnant or trying to get pregnant -breast-feeding How should I use this medicine? This medicine is for infusion into a vein. It is given by a health care professional in a hospital or clinic setting. A special MedGuide will be given to you by the pharmacist with each prescription and refill. Be sure to read this information carefully each time. Talk to your pediatrician regarding the use of this medicine in children. This medicine is not approved for use in children. Overdosage: If you think you've taken too much of this medicine contact a poison control center or emergency room at once. Overdosage: If you think you have taken too much of this medicine contact a poison control center or emergency room at once. NOTE: This medicine is only for you. Do not share this medicine  with others. What if I miss a dose? It is important not to miss your dose. Call your doctor or health care professional if you are unable to keep an appointment. What may interact with this medicine? -steroid medicines like prednisone or cortisone -TNF-alpha inhibitors like natalizumab, adalimumab, and infliximab -vaccines This list may not describe all possible interactions. Give your health care provider a list of all the medicines, herbs, non-prescription drugs, or dietary supplements you use. Also tell them if you smoke, drink alcohol, or use illegal drugs. Some items may interact with your medicine. What should I watch for while using this medicine? Your condition will be monitored carefully while you are receiving this medicine. Visit your doctor for regular check ups. Tell your doctor or healthcare professional if your symptoms do not start to get better or if they get worse. Stay away from people who are sick. Call your doctor or health care professional for advice if you get a fever, chills or sore throat, or other symptoms of a cold or flu. Do not treat yourself. In some patients, this medicine may cause a serious brain infection that may cause death. If you have any problems seeing, thinking, speaking, walking, or standing, tell your doctor right away. If you cannot reach your doctor, get urgent medical care. What side effects may I notice from receiving this medicine? Side effects that you should report to your doctor or health care professional as soon as possible: -allergic reactions like skin rash, itching or hives, swelling of the face, lips, or tongue -breathing problems -changes in vision -chest pain -dark urine -depression, feelings of sadness -dizziness -general ill  feeling or flu-like symptoms -irregular, missed, or painful menstrual periods -light-colored stools -loss of appetite, nausea -muscle weakness -problems with balance, talking, or walking -right upper belly  pain -unusually weak or tired -yellowing of the eyes or skin Side effects that usually do not require medical attention (Report these to your doctor or health care professional if they continue or are bothersome.): -aches, pains -headache -stomach upset -tiredness This list may not describe all possible side effects. Call your doctor for medical advice about side effects. You may report side effects to FDA at 1-800-FDA-1088. Where should I keep my medicine? This drug is given in a hospital or clinic and will not be stored at home. NOTE: This sheet is a summary. It may not cover all possible information. If you have questions about this medicine, talk to your doctor, pharmacist, or health care provider.  2015, Elsevier/Gold Standard. (2012-12-11 12:32:57)

## 2014-11-22 ENCOUNTER — Telehealth: Payer: Self-pay | Admitting: Internal Medicine

## 2014-11-22 NOTE — Telephone Encounter (Signed)
Patient notified that he has had chills, "hot then cold", muscle aches,  severe nausea and cramps after Entyvio infusion.  "Almost like I have the flu". Symptoms began at 7:00 pm on Friday night her infusion was at 1:00 on Friday night . Symptoms are slightly improved the aches are improved, but still having chills.    Please advise the next step.  She is advised to remain on prednisone until office visit.  Please advise if this is a presumed reaction to the [facility]? Should she get next infusion? If yes, does she need premedicated?

## 2014-11-24 ENCOUNTER — Telehealth: Payer: Self-pay | Admitting: Internal Medicine

## 2014-11-24 NOTE — Telephone Encounter (Signed)
Patient reports severe lethargy, severe stomach cramps, and now has some rectal bleeding.  "Can he give me something to make me feel better, I am so weak"

## 2014-11-25 ENCOUNTER — Ambulatory Visit (INDEPENDENT_AMBULATORY_CARE_PROVIDER_SITE_OTHER): Payer: Medicare Other | Admitting: Internal Medicine

## 2014-11-25 ENCOUNTER — Encounter: Payer: Self-pay | Admitting: Internal Medicine

## 2014-11-25 ENCOUNTER — Ambulatory Visit (INDEPENDENT_AMBULATORY_CARE_PROVIDER_SITE_OTHER)
Admission: RE | Admit: 2014-11-25 | Discharge: 2014-11-25 | Disposition: A | Discharge status: Home / Self Care | Payer: Medicare Other | Source: Ambulatory Visit | Attending: Internal Medicine | Admitting: Internal Medicine

## 2014-11-25 VITALS — BP 136/64 | HR 64 | Temp 98.1°F | Ht 65.0 in | Wt 184.0 lb

## 2014-11-25 DIAGNOSIS — R5381 Other malaise: Secondary | ICD-10-CM

## 2014-11-25 DIAGNOSIS — R5383 Other fatigue: Secondary | ICD-10-CM

## 2014-11-25 DIAGNOSIS — R059 Cough, unspecified: Secondary | ICD-10-CM

## 2014-11-25 DIAGNOSIS — R05 Cough: Secondary | ICD-10-CM | POA: Diagnosis not present

## 2014-11-25 DIAGNOSIS — R0600 Dyspnea, unspecified: Secondary | ICD-10-CM

## 2014-11-25 DIAGNOSIS — K50111 Crohn's disease of large intestine with rectal bleeding: Secondary | ICD-10-CM

## 2014-11-25 DIAGNOSIS — R0602 Shortness of breath: Secondary | ICD-10-CM | POA: Diagnosis not present

## 2014-11-25 DIAGNOSIS — R079 Chest pain, unspecified: Secondary | ICD-10-CM | POA: Diagnosis not present

## 2014-11-25 NOTE — Telephone Encounter (Signed)
I told her I would see her at 1145 today please enter appt

## 2014-11-25 NOTE — Assessment & Plan Note (Signed)
?   Mildly flaring

## 2014-11-25 NOTE — Progress Notes (Signed)
Subjective:    Patient ID: [place], female    DOB: 03-11-1941, 74 y.o.   MRN: [id number] Chief complaint: Weakness, cough, bloody diarrhea HPI Rayvin had her first Entyvio infusion 6 days ago. That night she became ill with weakness and arthralgias and subjective fever. She has had chills since then. She then developed some cramps and bloody diarrhea again though small amounts. She has not had fever that she is aware of, she is feeling better but is still very weak and gets winded and is having a cough that is mildly productive. Points are painful but not swollen. She was treated with antibiotics for a tick bite a 1 or so ago but the tic was examined and did not have any Lyme disease or other infection with it.  Allergies  Allergen Reactions  . Azathioprine Nausea Only    Elevated lipase also - ? Pancreatitis   . Adhesive [Tape] Rash    Taking skin off  . Sulfa Antibiotics Itching and Rash   Outpatient Prescriptions Prior to Visit  Medication Sig Dispense Refill  . b complex vitamins capsule Take 1 capsule by mouth daily.      Marland Kitchen BYSTOLIC 5 MG tablet Take 5 mg by mouth 2 (two) times daily.     . Calcium Carbonate-Vit D-Min (CALCIUM 1200) 1200-1000 MG-UNIT CHEW Take 1 chew by mouth once daily 1 each 0  . carboxymethylcellulose (REFRESH PLUS) 0.5 % SOLN 1 drop 3 (three) times daily as needed.    . cetirizine (ZYRTEC) 10 MG tablet Take 10 mg by mouth daily as needed. allergies    . Cholecalciferol (VITAMIN D) 2000 UNITS CAPS Take 1 capsule (2,000 Units total) by mouth daily. 1 capsule 0  . cycloSPORINE (RESTASIS) 0.05 % ophthalmic emulsion 1 drop 2 (two) times daily.    Marland Kitchen dicyclomine (BENTYL) 20 MG tablet Take 1 tablet (20 mg total) by mouth 3 (three) times daily. For stomach pain 90 tablet 5  . diphenoxylate-atropine (LOMOTIL) 2.5-0.025 MG per tablet TAKE 1 TO 2 TABLETS EVERY 4 HOURS AS NEEDED FOR DIARRHEA 90 tablet 11  . hydrochlorothiazide (HYDRODIURIL) 25 MG tablet     . Multiple  Vitamins-Minerals (MULTIVITAMINS THER. W/MINERALS) TABS Take 1 tablet by mouth daily.      . Omega-3 Fatty Acids (FISH OIL) 1000 MG CAPS Take 1,000 mg by mouth daily.      . ondansetron (ZOFRAN) 4 MG tablet Take 1 tablet (4 mg total) by mouth every 8 (eight) hours as needed for nausea or vomiting. 30 tablet 1  . pantoprazole (PROTONIX) 40 MG tablet Take 1 tablet (40 mg total) by mouth daily. 30 tablet 3  . predniSONE (DELTASONE) 10 MG tablet Take 2 tablets (20 mg total) by mouth daily. (Patient taking differently: Take 15 mg by mouth daily. ) 100 tablet 0  . vedolizumab (ENTYVIO) 300 MG injection Inject 300 mg into the vein.     No facility-administered medications prior to visit.   Past Medical History  Diagnosis Date  . Crohn's colitis   . Hypertension   . Colon polyps     adenomatous polyp June 2012  . Complication of anesthesia     slow to wake  . Shortness of breath   . Blood transfusion     1966  . Headache(784.0)     hx migraines  . Arthritis     shoulder  . Bell's palsy   . GERD (gastroesophageal reflux disease)   . Dry eyes  left eye worst one   Past Surgical History  Procedure Laterality Date  . Cholecystectomy    . Abdominal hysterectomy    . Tonsillectomy    . Shoulder surgery      right  . Foot ganglion excision      left  . Appendectomy    . Flexible sigmoidoscopy  06/08/2011    severe Crohn's colitis to descending colon at least  . Colonoscopy  multiple  . Flex sigmoidoscopy with biospy  01/17/12  . Catarac surgery      May 1 and May 27     Review of Systems As per history of present illness    Objective:   Physical Exam @BP  136/64 mmHg  Pulse 64  Temp(Src) 98.1 F (36.7 C) (Oral)  Ht 5' 5"  (1.651 m)  Wt 184 lb (83.462 kg)  BMI 30.62 kg/m2@  General:  NAD Eyes:   anicteric Lungs:  clear Heart:: S1S2 no rubs, murmurs or gallops Abdomen:  soft and nontender, BS+ Ext:   Trace ankle edema, cyanosis or clubbing Musculoskeletal no signs of  inflammatory arthritis in any of the joints that I can see. Nodes: No cervical supraclavicular axillary or inguinal adenopathy  Data Reviewed:    I reviewed the 11/19/2014 Entyvio infusion records. It looked uneventful      Assessment & Plan:   1. Cough   2. Dyspnea   3. Malaise and fatigue   4. Crohn's colitis, with rectal bleeding    I'm not sure was happening this sounds like a viral syndrome to me. She does exhibit some signs of improvement per her history. However she has a mildly productive cough. I don't see any other physical findings to suggest problems. I don't think this is a reaction to the [facility] as I don't think it should last that long but it's possible but again seems very unlikely to me. She is due for another infusion in about one week will have to have her check back in to make a decision about that.  She is currently on 15 mg prednisone. I will continue that.  Chest x-ray PA and lateral was ordered and it was negative.  I had intended to order labs but I did not do so. Will see how she does and if not better early next week check those but I doubt they will change anything at this point.  CC: [name], NP

## 2014-11-25 NOTE — Progress Notes (Signed)
Quick Note:  Chest x-ray is okay I think she has some for virus which should get better with time I would like her to check back in with me on Monday by phone and give [place] an update We may need to order labs next week if she is having persistent problems ______

## 2014-11-25 NOTE — Patient Instructions (Signed)
Please go to the x-ray department in our basement before leaving today for a chest x-ray.  Dr. Carlean Purl will call you with results and plans.     I appreciate the opportunity to care for you. [name], M.D., [facility]

## 2014-11-25 NOTE — Telephone Encounter (Signed)
appt entered

## 2014-11-29 ENCOUNTER — Other Ambulatory Visit (INDEPENDENT_AMBULATORY_CARE_PROVIDER_SITE_OTHER): Payer: Medicare Other

## 2014-11-29 ENCOUNTER — Telehealth: Payer: Self-pay | Admitting: Internal Medicine

## 2014-11-29 DIAGNOSIS — R059 Cough, unspecified: Secondary | ICD-10-CM

## 2014-11-29 DIAGNOSIS — R05 Cough: Secondary | ICD-10-CM

## 2014-11-29 DIAGNOSIS — R5383 Other fatigue: Secondary | ICD-10-CM

## 2014-11-29 LAB — CBC WITH DIFFERENTIAL/PLATELET
BASOS ABS: 0 10*3/uL (ref 0.0–0.1)
Basophils Relative: 0 % (ref 0.0–3.0)
EOS PCT: 0.7 % (ref 0.0–5.0)
Eosinophils Absolute: 0.1 10*3/uL (ref 0.0–0.7)
HEMATOCRIT: 45.4 % (ref 36.0–46.0)
Hemoglobin: 15.6 g/dL — ABNORMAL HIGH (ref 12.0–15.0)
LYMPHS ABS: 1.2 10*3/uL (ref 0.7–4.0)
LYMPHS PCT: 6.7 % — AB (ref 12.0–46.0)
MCHC: 34.4 g/dL (ref 30.0–36.0)
MCV: 91.3 fl (ref 78.0–100.0)
Monocytes Absolute: 0.9 10*3/uL (ref 0.1–1.0)
Monocytes Relative: 5.3 % (ref 3.0–12.0)
Neutro Abs: 15.2 10*3/uL — ABNORMAL HIGH (ref 1.4–7.7)
Neutrophils Relative %: 87.3 % — ABNORMAL HIGH (ref 43.0–77.0)
PLATELETS: 592 10*3/uL — AB (ref 150.0–400.0)
RBC: 4.97 Mil/uL (ref 3.87–5.11)
RDW: 12.6 % (ref 11.5–15.5)
WBC: 17.4 10*3/uL — AB (ref 4.0–10.5)

## 2014-11-29 LAB — COMPREHENSIVE METABOLIC PANEL
ALT: 16 U/L (ref 0–35)
AST: 18 U/L (ref 0–37)
Albumin: 3.7 g/dL (ref 3.5–5.2)
Alkaline Phosphatase: 77 U/L (ref 39–117)
BILIRUBIN TOTAL: 0.4 mg/dL (ref 0.2–1.2)
BUN: 15 mg/dL (ref 6–23)
CO2: 30 meq/L (ref 19–32)
Calcium: 9.8 mg/dL (ref 8.4–10.5)
Chloride: 93 mEq/L — ABNORMAL LOW (ref 96–112)
Creatinine, Ser: 1.12 mg/dL (ref 0.40–1.20)
GFR: 50.67 mL/min — ABNORMAL LOW (ref >60.00)
Glucose, Bld: 96 mg/dL (ref 70–99)
Potassium: 3.7 mEq/L (ref 3.5–5.1)
SODIUM: 129 meq/L — AB (ref 135–145)
TOTAL PROTEIN: 7.6 g/dL (ref 6.0–8.3)

## 2014-11-29 LAB — C-REACTIVE PROTEIN: CRP: 2 mg/dL (ref 0.5–20.0)

## 2014-11-29 NOTE — Telephone Encounter (Signed)
Patient reports that she is still tired and nausea.  Her main complaint is lethargy.  Cough has improved, but she still has it.  She is due for her next Entyvio on Friday.  Please advise.

## 2014-11-29 NOTE — Telephone Encounter (Signed)
CBC, CMET, CRP please  Still thinking she has had a viral illness that is improving (slowly)  Will decide about Entyvio later this week - would not cancel at this point in time

## 2014-11-29 NOTE — Telephone Encounter (Signed)
Spoke with Stanton Kidney, she will try to come this afternoon to get labs drawn.  Informed her that Dr Carlean Purl will decide about the [facility] later this week.

## 2014-11-30 ENCOUNTER — Other Ambulatory Visit: Payer: Self-pay

## 2014-11-30 DIAGNOSIS — K5010 Crohn's disease of large intestine without complications: Secondary | ICD-10-CM

## 2014-11-30 NOTE — Progress Notes (Signed)
Quick Note:  Sodium is low Most likely from HCTZ Can make her weak, tired, etc Stop HCTZ BMET on Thursday please and have her BP checked at our office also Cc her PCP on this ______

## 2014-12-02 ENCOUNTER — Other Ambulatory Visit: Payer: Self-pay

## 2014-12-02 ENCOUNTER — Other Ambulatory Visit (INDEPENDENT_AMBULATORY_CARE_PROVIDER_SITE_OTHER): Payer: Medicare Other

## 2014-12-02 ENCOUNTER — Ambulatory Visit (INDEPENDENT_AMBULATORY_CARE_PROVIDER_SITE_OTHER): Payer: Self-pay | Admitting: Internal Medicine

## 2014-12-02 VITALS — BP 136/68

## 2014-12-02 DIAGNOSIS — E871 Hypo-osmolality and hyponatremia: Secondary | ICD-10-CM

## 2014-12-02 DIAGNOSIS — K5010 Crohn's disease of large intestine without complications: Secondary | ICD-10-CM

## 2014-12-02 LAB — BASIC METABOLIC PANEL
BUN: 14 mg/dL (ref 6–23)
CO2: 31 mEq/L (ref 19–32)
CREATININE: 1.14 mg/dL (ref 0.40–1.20)
Calcium: 9.8 mg/dL (ref 8.4–10.5)
Chloride: 95 mEq/L — ABNORMAL LOW (ref 96–112)
GFR: 49.64 mL/min — AB (ref >60.00)
Glucose, Bld: 98 mg/dL (ref 70–99)
Potassium: 4.4 mEq/L (ref 3.5–5.1)
Sodium: 132 mEq/L — ABNORMAL LOW (ref 135–145)

## 2014-12-02 NOTE — Progress Notes (Signed)
BP CHECK 136/68

## 2014-12-02 NOTE — Progress Notes (Signed)
Quick Note:  Results called to her She is feeling better overall  Plan: Entyvio tomorrow Ask them to give her 1 L NS bolus over 2 hrs while she is there also dx hyponatremia ______

## 2014-12-03 ENCOUNTER — Encounter (HOSPITAL_COMMUNITY): Payer: Self-pay

## 2014-12-03 ENCOUNTER — Encounter (HOSPITAL_COMMUNITY)
Admission: RE | Admit: 2014-12-03 | Discharge: 2014-12-03 | Disposition: A | Discharge status: Home / Self Care | Payer: Medicare Other | Source: Ambulatory Visit | Attending: Internal Medicine | Admitting: Internal Medicine

## 2014-12-03 VITALS — BP 150/64 | HR 55 | Temp 98.1°F | Resp 18 | Ht 65.0 in | Wt 185.1 lb

## 2014-12-03 DIAGNOSIS — K50118 Crohn's disease of large intestine with other complication: Secondary | ICD-10-CM | POA: Insufficient documentation

## 2014-12-03 MED ORDER — VEDOLIZUMAB 300 MG IV SOLR
300.0000 mg | Freq: Once | INTRAVENOUS | Status: AC
  Administered 2014-12-03: 300 mg via INTRAVENOUS
  Filled 2014-12-03: qty 5

## 2014-12-03 MED ORDER — SODIUM CHLORIDE 0.9 % IV SOLN
Freq: Once | INTRAVENOUS | Status: AC
  Administered 2014-12-03: 1000 mL via INTRAVENOUS

## 2014-12-03 NOTE — Progress Notes (Signed)
Uneventful infusion of Entyvio over 30 minutes  and 1047m of saline 2.5 hours Next scheduled appointment for Entyvio is in 4 weeks on 12/31/14. Pt discharge ambulatory accompanied by sister

## 2014-12-03 NOTE — Discharge Instructions (Signed)
°  Hyponatremia  Hyponatremia is when the amount of salt (sodium) in your blood is too low. When sodium levels are low, your cells will absorb extra water and swell. The swelling happens throughout the body, but it mostly affects the brain. Severe brain swelling (cerebral edema), seizures, or coma can happen.  CAUSES   Heart, kidney, or liver problems.  Thyroid problems.  Adrenal gland problems.  Severe vomiting and diarrhea.  Certain medicines or illegal drugs.  Dehydration.  Drinking too much water.  Low-sodium diet. SYMPTOMS   Nausea and vomiting.  Confusion.  Lethargy.  Agitation.  Headache.  Twitching or shaking (seizures).  Unconsciousness.  Appetite loss.  Muscle weakness and cramping. DIAGNOSIS  Hyponatremia is identified by a simple blood test. Your caregiver will perform a history and physical exam to try to find the cause and type of hyponatremia. Other tests may be needed to measure the amount of sodium in your blood and urine. TREATMENT  Treatment will depend on the cause.   Fluids may be given through the vein (IV).  Medicines may be used to correct the sodium imbalance. If medicines are causing the problem, they will need to be adjusted.  Water or fluid intake may be restricted to restore proper balance. The speed of correcting the sodium problem is very important. If the problem is corrected too fast, nerve damage (sometimes unchangeable) can happen. HOME CARE INSTRUCTIONS   Only take medicines as directed by your caregiver. Many medicines can make hyponatremia worse. Discuss all your medicines with your caregiver.  Carefully follow any recommended diet, including any fluid restrictions.  You may be asked to repeat lab tests. Follow these directions.  Avoid alcohol and recreational drugs. SEEK MEDICAL CARE IF:   You develop worsening nausea, fatigue, headache, confusion, or weakness.  Your original hyponatremia symptoms return.  You have  problems following the recommended diet. SEEK IMMEDIATE MEDICAL CARE IF:   You have a seizure.  You faint.  You have ongoing diarrhea or vomiting. MAKE SURE YOU:   Understand these instructions.  Will watch your condition.  Will get help right away if you are not doing well or get worse. Document Released: 06/29/2002 Document Revised: 10/01/2011 Document Reviewed: 12/24/2010 [facility] Patient Information 2015 [place], [place]. This information is not intended to replace advice given to you by your health care provider. Make sure you discuss any questions you have with your health care provider.

## 2014-12-03 NOTE — Progress Notes (Signed)
Spoke with Barbera Setters RN at Dr. Celesta Aver office. Patient is to receive 1066m of normal saline while here after getting Entyvio. MD aware patient has been sick but he stated she IS to receive Entyvio today.

## 2014-12-07 ENCOUNTER — Ambulatory Visit (INDEPENDENT_AMBULATORY_CARE_PROVIDER_SITE_OTHER): Payer: Medicare Other | Admitting: Internal Medicine

## 2014-12-07 ENCOUNTER — Encounter: Payer: Self-pay | Admitting: Internal Medicine

## 2014-12-07 VITALS — BP 128/84 | HR 65 | Ht 66.0 in | Wt 184.0 lb

## 2014-12-07 DIAGNOSIS — R739 Hyperglycemia, unspecified: Secondary | ICD-10-CM

## 2014-12-07 DIAGNOSIS — I10 Essential (primary) hypertension: Secondary | ICD-10-CM

## 2014-12-07 DIAGNOSIS — Z7952 Long term (current) use of systemic steroids: Secondary | ICD-10-CM

## 2014-12-07 MED ORDER — BYSTOLIC 10 MG PO TABS: 10.0000 mg | ORAL_TABLET | Freq: Every day | ORAL | Status: DC

## 2014-12-07 MED ORDER — PANTOPRAZOLE SODIUM 40 MG PO TBEC: 40.0000 mg | DELAYED_RELEASE_TABLET | Freq: Every day | ORAL | Status: DC

## 2014-12-07 MED ORDER — AMLODIPINE BESYLATE 5 MG PO TABS: 5.0000 mg | ORAL_TABLET | Freq: Every day | ORAL | Status: DC

## 2014-12-07 NOTE — Assessment & Plan Note (Signed)
Due to her chronic steroids and will need close monitoring. Last check okay and will recheck at next visit. Her appetite is poor at this time due to treatments for her crohn's disease. She is still trying to stay active and encouraged her to exercise as able and to avoid simple sugars.

## 2014-12-07 NOTE — Assessment & Plan Note (Signed)
Will need to review records when we get them to see when her last DEXA was. If not recent needs to be updated.

## 2014-12-07 NOTE — Patient Instructions (Signed)
We are not going to change the medicines today and are not taking blood.   Keep using the hydrochlorothiazide only for fluid. Do not take it everyday.   If you need any refills please feel free to call the office.   Come back in about 6 months and we can check on the blood pressure and the sugars (since the prednisone can make them go up)  Work on exercising if you can about 3 times per week to keep the muscles from breaking down.   Exercise to Stay Healthy Exercise helps you become and stay healthy. EXERCISE IDEAS AND TIPS Choose exercises that:  You enjoy.  Fit into your day. You do not need to exercise really hard to be healthy. You can do exercises at a slow or medium level and stay healthy. You can:  Stretch before and after working out.  Try yoga, Pilates, or tai chi.  Lift weights.  Walk fast, swim, jog, run, climb stairs, bicycle, dance, or rollerskate.  Take aerobic classes. Exercises that burn about 150 calories:  Running 1  miles in 15 minutes.  Playing volleyball for 45 to 60 minutes.  Washing and waxing a car for 45 to 60 minutes.  Playing touch football for 45 minutes.  Walking 1  miles in 35 minutes.  Pushing a stroller 1  miles in 30 minutes.  Playing basketball for 30 minutes.  Raking leaves for 30 minutes.  Bicycling 5 miles in 30 minutes.  Walking 2 miles in 30 minutes.  Dancing for 30 minutes.  Shoveling snow for 15 minutes.  Swimming laps for 20 minutes.  Walking up stairs for 15 minutes.  Bicycling 4 miles in 15 minutes.  Gardening for 30 to 45 minutes.  Jumping rope for 15 minutes.  Washing windows or floors for 45 to 60 minutes. Document Released: 08/11/2010 Document Revised: 10/01/2011 Document Reviewed: 08/11/2010 [facility] Patient Information 2015 [place], [place]. This information is not intended to replace advice given to you by your health care provider. Make sure you discuss any questions you have with your health care  provider.

## 2014-12-07 NOTE — Progress Notes (Signed)
   Subjective:    Patient ID: [place], female    DOB: 17-Aug-1940, 74 y.o.   MRN: [id number]  HPI The patient is a new 74 YO female who is coming in for her blood pressure. Her GI doctor recently stopped her HCTZ and she is concerned that her pressure may be going up. She denies headaches, chest pains, SOB. She is taking her other medications as prescribed. No lightheadedness. She is also struggling with crohn's disease and is currently taking several medications including prednisone to try to help with her crohn's.   PMH, [facility], social history reviewed and updated with patient.   Review of Systems  Constitutional: Positive for activity change, appetite change and fatigue. Negative for fever, chills and unexpected weight change.  HENT: Negative.   Eyes: Negative.   Respiratory: Negative for cough, chest tightness, shortness of breath and wheezing.   Cardiovascular: Negative for chest pain, palpitations and leg swelling.  Gastrointestinal: Negative for abdominal pain, diarrhea, constipation and abdominal distention.  Musculoskeletal: Positive for arthralgias. Negative for myalgias and back pain.  Skin: Negative.   Neurological: Negative.   Psychiatric/Behavioral: Negative.       Objective:   Physical Exam  Constitutional: She is oriented to person, place, and time. She appears well-developed and well-nourished.  HENT:  Head: Normocephalic and atraumatic.  Eyes: EOM are normal.  Neck: Normal range of motion.  Cardiovascular: Normal rate and regular rhythm.   Pulmonary/Chest: Effort normal. No respiratory distress. She has no wheezes. She has no rales.  Abdominal: Soft. She exhibits no distension. There is no tenderness. There is no rebound.  Musculoskeletal: She exhibits no edema.  Neurological: She is alert and oriented to person, place, and time. Coordination normal.  Skin: Skin is warm and dry.   Filed Vitals:   12/07/14 0847  BP: 128/84  Pulse: 65  Height: 5' 6"  (1.676 m)   Weight: 184 lb (83.462 kg)  SpO2: 91%      Assessment & Plan:

## 2014-12-07 NOTE — Progress Notes (Signed)
Pre visit review using our clinic review tool, if applicable. No additional management support is needed unless otherwise documented below in the visit note. 

## 2014-12-07 NOTE — Assessment & Plan Note (Signed)
She has stopped taking the hydrochlorothiazide and is still taking amlodipine and bystolic. BP controlled today so will continue off the HCTZ. Labs checked recently and reviewed normal no need for recheck today.

## 2014-12-13 ENCOUNTER — Emergency Department (HOSPITAL_COMMUNITY): Payer: Medicare Other

## 2014-12-13 ENCOUNTER — Emergency Department (HOSPITAL_COMMUNITY)
Admission: EM | Admit: 2014-12-13 | Discharge: 2014-12-13 | Disposition: A | Discharge status: Home / Self Care | Payer: Medicare Other | Attending: Emergency Medicine | Admitting: Emergency Medicine

## 2014-12-13 ENCOUNTER — Encounter (HOSPITAL_COMMUNITY): Payer: Self-pay | Admitting: Emergency Medicine

## 2014-12-13 ENCOUNTER — Telehealth: Payer: Self-pay | Admitting: Internal Medicine

## 2014-12-13 DIAGNOSIS — Z9049 Acquired absence of other specified parts of digestive tract: Secondary | ICD-10-CM | POA: Diagnosis not present

## 2014-12-13 DIAGNOSIS — K5090 Crohn's disease, unspecified, without complications: Secondary | ICD-10-CM | POA: Diagnosis not present

## 2014-12-13 DIAGNOSIS — R1030 Lower abdominal pain, unspecified: Secondary | ICD-10-CM | POA: Diagnosis not present

## 2014-12-13 DIAGNOSIS — Z79899 Other long term (current) drug therapy: Secondary | ICD-10-CM | POA: Diagnosis not present

## 2014-12-13 DIAGNOSIS — I10 Essential (primary) hypertension: Secondary | ICD-10-CM | POA: Insufficient documentation

## 2014-12-13 DIAGNOSIS — K219 Gastro-esophageal reflux disease without esophagitis: Secondary | ICD-10-CM | POA: Insufficient documentation

## 2014-12-13 DIAGNOSIS — Z8669 Personal history of other diseases of the nervous system and sense organs: Secondary | ICD-10-CM | POA: Diagnosis not present

## 2014-12-13 DIAGNOSIS — Z87891 Personal history of nicotine dependence: Secondary | ICD-10-CM | POA: Insufficient documentation

## 2014-12-13 DIAGNOSIS — Z8739 Personal history of other diseases of the musculoskeletal system and connective tissue: Secondary | ICD-10-CM | POA: Insufficient documentation

## 2014-12-13 DIAGNOSIS — R1032 Left lower quadrant pain: Secondary | ICD-10-CM | POA: Diagnosis present

## 2014-12-13 LAB — COMPREHENSIVE METABOLIC PANEL
ALBUMIN: 3.1 g/dL — AB (ref 3.5–5.0)
ALT: 12 U/L — ABNORMAL LOW (ref 14–54)
AST: 16 U/L (ref 15–41)
Alkaline Phosphatase: 66 U/L (ref 38–126)
Anion gap: 7 (ref 5–15)
BILIRUBIN TOTAL: 0.4 mg/dL (ref 0.3–1.2)
BUN: 10 mg/dL (ref 6–20)
CALCIUM: 8.5 mg/dL — AB (ref 8.9–10.3)
CO2: 24 mmol/L (ref 22–32)
Chloride: 101 mmol/L (ref 101–111)
Creatinine, Ser: 1.19 mg/dL — ABNORMAL HIGH (ref 0.44–1.00)
GFR, EST AFRICAN AMERICAN: 51 mL/min — AB (ref >60)
GFR, EST NON AFRICAN AMERICAN: 44 mL/min — AB (ref >60)
GLUCOSE: 121 mg/dL — AB (ref 65–99)
POTASSIUM: 3.5 mmol/L (ref 3.5–5.1)
SODIUM: 132 mmol/L — AB (ref 135–145)
TOTAL PROTEIN: 6.8 g/dL (ref 6.5–8.1)

## 2014-12-13 LAB — URINALYSIS, ROUTINE W REFLEX MICROSCOPIC
Bilirubin Urine: NEGATIVE
Glucose, UA: NEGATIVE mg/dL
Ketones, ur: NEGATIVE mg/dL
NITRITE: NEGATIVE
Protein, ur: NEGATIVE mg/dL
SPECIFIC GRAVITY, URINE: 1.006 (ref 1.005–1.030)
Urobilinogen, UA: 0.2 mg/dL (ref 0.0–1.0)
pH: 6.5 (ref 5.0–8.0)

## 2014-12-13 LAB — CBC WITH DIFFERENTIAL/PLATELET
BASOS ABS: 0 10*3/uL (ref 0.0–0.1)
Basophils Relative: 0 % (ref 0–1)
EOS ABS: 0.6 10*3/uL (ref 0.0–0.7)
EOS PCT: 4 % (ref 0–5)
HCT: 44.3 % (ref 36.0–46.0)
Hemoglobin: 14.7 g/dL (ref 12.0–15.0)
LYMPHS PCT: 9 % — AB (ref 12–46)
Lymphs Abs: 1.3 10*3/uL (ref 0.7–4.0)
MCH: 31.4 pg (ref 26.0–34.0)
MCHC: 33.2 g/dL (ref 30.0–36.0)
MCV: 94.7 fL (ref 78.0–100.0)
MONOS PCT: 16 % — AB (ref 3–12)
Monocytes Absolute: 2.3 10*3/uL — ABNORMAL HIGH (ref 0.1–1.0)
Neutro Abs: 10.1 10*3/uL — ABNORMAL HIGH (ref 1.7–7.7)
Neutrophils Relative %: 71 % (ref 43–77)
Platelets: 593 10*3/uL — ABNORMAL HIGH (ref 150–400)
RBC: 4.68 MIL/uL (ref 3.87–5.11)
RDW: 12.8 % (ref 11.5–15.5)
WBC MORPHOLOGY: INCREASED
WBC: 14.3 10*3/uL — AB (ref 4.0–10.5)

## 2014-12-13 LAB — LIPASE, BLOOD: LIPASE: 26 U/L (ref 22–51)

## 2014-12-13 LAB — URINE MICROSCOPIC-ADD ON

## 2014-12-13 LAB — POC OCCULT BLOOD, ED: Fecal Occult Bld: POSITIVE — AB

## 2014-12-13 MED ORDER — SODIUM CHLORIDE 0.9 % IV BOLUS (SEPSIS)
1000.0000 mL | Freq: Once | INTRAVENOUS | Status: AC
  Administered 2014-12-13: 1000 mL via INTRAVENOUS

## 2014-12-13 MED ORDER — IOHEXOL 300 MG/ML  SOLN
50.0000 mL | Freq: Once | INTRAMUSCULAR | Status: AC | PRN
Start: 2014-12-13 — End: 2014-12-13
  Administered 2014-12-13: 50 mL via ORAL

## 2014-12-13 NOTE — Telephone Encounter (Signed)
Patient notified of recommendations.  She is advised that someone should drive her.  She reports she will go to the ER at [facility].  [name], RNP notified

## 2014-12-13 NOTE — ED Provider Notes (Signed)
CSN: [id number]     Arrival date & time 12/13/14  1116 History   First MD Initiated Contact with Patient 12/13/14 1139     Chief Complaint  Patient presents with  . Crohn's Disease  . Sent by GI doctor      (Consider location/radiation/quality/duration/timing/severity/associated sxs/prior Treatment) The history is provided by the patient. No language interpreter was used.  [name] [name] is a 74 y/o F with PMHx of Crohn's disease, HTN, colonic polyps, shortness pf breath, blood transfusion, GERD, dry eyes, bell's palsy presenting to the ED with abdominal pain and rectal bleeding for the past 7 days. Patient reported that she has been dealing with Crohn's disease for the past 4-5 years. Reported that she recently had an infusion of Entyvio, new medication, on 12/03/2014 and stated that since this infusion she has been having issues with her stomach. Stated that she continues to take prednisone. Patient reported that she has been having bloody stools for the past 7 days. Stated that she noticed black tarry stools and passing of blood clots. Stated that she has been passing blood even when she passes gas. Patient reported that she was concerned and spoke with her GI office who recommended the patient to come and be assessed in the ED setting. Denied fever, chills, neck pain, neck stiffness, coughing, hemoptysis, chest pain, shortness of breath, difficulty breathing, fainting, leg swelling, decreased urination, dysuria, hematuria, vomiting, hematemesis. PCP Dr. Doug Sou Gastroenterologist Dr. Carlean Purl  Past Medical History  Diagnosis Date  . Crohn's colitis   . Hypertension   . Colon polyps     adenomatous polyp June 2012  . Complication of anesthesia     slow to wake  . Shortness of breath   . Blood transfusion     1966  . Headache(784.0)     hx migraines  . Arthritis     shoulder  . Bell's palsy   . GERD (gastroesophageal reflux disease)   . Dry eyes     left eye worst one   Past  Surgical History  Procedure Laterality Date  . Cholecystectomy    . Abdominal hysterectomy    . Tonsillectomy    . Shoulder surgery      right  . Foot ganglion excision      left  . Appendectomy    . Flexible sigmoidoscopy  06/08/2011    severe Crohn's colitis to descending colon at least  . Colonoscopy  multiple  . Flex sigmoidoscopy with biospy  01/17/12  . Cataract extraction Bilateral     Separate dates   Family History  Problem Relation Age of Onset  . Leukemia Mother   . Heart disease Mother   . Anesthesia problems Neg Hx   . Hypotension Neg Hx   . Malignant hyperthermia Neg Hx   . Pseudochol deficiency Neg Hx   . Colon cancer Neg Hx   . Diabetes Neg Hx   . Heart disease Father   . Breast cancer Paternal Aunt     1/2 aunt  . Throat cancer Paternal Aunt     1/2 aunt  . Ovarian cancer Paternal Aunt     1/2 aunt  . Parkinsonism Brother   . Heart disease Brother   . Leukemia Maternal Grandfather   . Heart attack Father    History  Substance Use Topics  . Smoking status: Former Smoker    Types: Cigarettes    Quit date: 09/26/1968  . Smokeless tobacco: Never Used  . Alcohol Use: No  Comment: rare wine   OB History    No data available     Review of Systems  Constitutional: Negative for fever and chills.  Respiratory: Negative for chest tightness and shortness of breath.   Cardiovascular: Negative for chest pain.  Gastrointestinal: Positive for abdominal pain and blood in stool. Negative for nausea, vomiting, diarrhea, constipation and anal bleeding.  Musculoskeletal: Negative for back pain, neck pain and neck stiffness.  Neurological: Negative for dizziness, weakness and numbness.      Allergies  Azathioprine; Adhesive; and Sulfa antibiotics  Home Medications   Prior to Admission medications   Medication Sig Start Date End Date Taking? Authorizing Provider  amLODipine (NORVASC) 5 MG tablet Take 1 tablet (5 mg total) by mouth at bedtime. 12/07/14   Yes Olga Millers, MD  b complex vitamins capsule Take 1 capsule by mouth daily.     Yes Historical Provider, MD  BYSTOLIC 10 MG tablet Take 1 tablet (10 mg total) by mouth daily. 12/07/14  Yes Olga Millers, MD  Calcium Carbonate-Vit D-Min (CALCIUM 1200) 1200-1000 MG-UNIT CHEW Take 1 chew by mouth once daily Patient taking differently: Chew 1 tablet by mouth daily.  09/24/11  Yes [name], MD  carboxymethylcellulose (REFRESH PLUS) 0.5 % SOLN Place 1 drop into both eyes 3 (three) times daily as needed (dry eyes).    Yes Historical Provider, MD  cetirizine (ZYRTEC) 10 MG tablet Take 10 mg by mouth daily as needed for allergies.    Yes Historical Provider, MD  dicyclomine (BENTYL) 20 MG tablet Take 1 tablet (20 mg total) by mouth 3 (three) times daily. For stomach pain 08/09/14  Yes [name], MD  diphenoxylate-atropine (LOMOTIL) 2.5-0.025 MG per tablet TAKE 1 TO 2 TABLETS EVERY 4 HOURS AS NEEDED FOR DIARRHEA Patient taking differently: Take 1-2 tablets by mouth every 4 (four) hours as needed for diarrhea or loose stools.  08/06/14  Yes [name], MD  Flaxseed, Linseed, (FLAX SEED OIL) 1000 MG CAPS Take 1,000 mg by mouth daily.   Yes Historical Provider, MD  Multiple Vitamins-Minerals (MULTIVITAMINS THER. W/MINERALS) TABS Take 1 tablet by mouth daily.     Yes Historical Provider, MD  ondansetron (ZOFRAN) 4 MG tablet Take 1 tablet (4 mg total) by mouth every 8 (eight) hours as needed for nausea or vomiting. 09/21/14  Yes Jessica D Zehr, PA-C  pantoprazole (PROTONIX) 40 MG tablet Take 1 tablet (40 mg total) by mouth daily. 12/07/14  Yes Olga Millers, MD  predniSONE (DELTASONE) 10 MG tablet Take 2 tablets (20 mg total) by mouth daily. Patient taking differently: Take 15 mg by mouth daily.  10/11/14  Yes [name], MD  vedolizumab (ENTYVIO) 300 MG injection Inject 300 mg into the vein.   Yes Historical Provider, MD  Cholecalciferol (VITAMIN D) 2000 UNITS CAPS Take 1 capsule  (2,000 Units total) by mouth daily. Patient not taking: Reported on 12/13/2014 09/24/11   [name], MD   BP 155/78 mmHg  Pulse 65  Temp(Src) 98.4 F (36.9 C) (Oral)  Resp 19  SpO2 96% Physical Exam  Constitutional: She is oriented to person, place, and time. She appears well-developed and well-nourished. No distress.  HENT:  Head: Normocephalic and atraumatic.  Mouth/Throat: Oropharynx is clear and moist. No oropharyngeal exudate.  Eyes: Conjunctivae and EOM are normal. Pupils are equal, round, and reactive to light. Right eye exhibits no discharge. Left eye exhibits no discharge.  Neck: Normal range of motion. Neck  supple. No tracheal deviation present.  Cardiovascular: Normal rate, regular rhythm and normal heart sounds.  Exam reveals no friction rub.   No murmur heard. Pulses:      Radial pulses are 2+ on the right side, and 2+ on the left side.  Pulmonary/Chest: Effort normal and breath sounds normal. No respiratory distress. She has no wheezes. She has no rales.  Abdominal: Soft. Bowel sounds are normal. She exhibits no distension. There is tenderness in the suprapubic area and left lower quadrant. There is no rebound and no guarding.  Genitourinary:  Rectal Exam: Negative bright red blood per rectum. Negative hemorrhoids, swelling, erythema, inflammation, lesions, sores, deformities. Strong sphincter tone. Blood noted on glove. Negative pain with examination.  Exam chaperoned with nurse, Radene Ou.   Musculoskeletal: Normal range of motion.  Lymphadenopathy:    She has no cervical adenopathy.  Neurological: She is alert and oriented to person, place, and time. No cranial nerve deficit. She exhibits normal muscle tone. Coordination normal. GCS eye subscore is 4. GCS verbal subscore is 5. GCS motor subscore is 6.  Skin: Skin is warm and dry. No rash noted. She is not diaphoretic. No erythema.  Psychiatric: She has a normal mood and affect. Her behavior is normal. Thought content  normal.  Nursing note and vitals reviewed.   ED Course  Procedures (including critical care time)  Results for orders placed or performed during the hospital encounter of 12/13/14  CBC with Differential  Result Value Ref Range   WBC 14.3 (H) 4.0 - 10.5 K/uL   RBC 4.68 3.87 - 5.11 MIL/uL   Hemoglobin 14.7 12.0 - 15.0 g/dL   HCT 44.3 36.0 - 46.0 %   MCV 94.7 78.0 - 100.0 fL   MCH 31.4 26.0 - 34.0 pg   MCHC 33.2 30.0 - 36.0 g/dL   RDW 12.8 11.5 - 15.5 %   Platelets 593 (H) 150 - 400 K/uL   Neutrophils Relative % 71 43 - 77 %   Lymphocytes Relative 9 (L) 12 - 46 %   Monocytes Relative 16 (H) 3 - 12 %   Eosinophils Relative 4 0 - 5 %   Basophils Relative 0 0 - 1 %   Neutro Abs 10.1 (H) 1.7 - 7.7 K/uL   Lymphs Abs 1.3 0.7 - 4.0 K/uL   Monocytes Absolute 2.3 (H) 0.1 - 1.0 K/uL   Eosinophils Absolute 0.6 0.0 - 0.7 K/uL   Basophils Absolute 0.0 0.0 - 0.1 K/uL   WBC Morphology INCREASED BANDS (>20% BANDS)   Comprehensive metabolic panel  Result Value Ref Range   Sodium 132 (L) 135 - 145 mmol/L   Potassium 3.5 3.5 - 5.1 mmol/L   Chloride 101 101 - 111 mmol/L   CO2 24 22 - 32 mmol/L   Glucose, Bld 121 (H) 65 - 99 mg/dL   BUN 10 6 - 20 mg/dL   Creatinine, Ser 1.19 (H) 0.44 - 1.00 mg/dL   Calcium 8.5 (L) 8.9 - 10.3 mg/dL   Total Protein 6.8 6.5 - 8.1 g/dL   Albumin 3.1 (L) 3.5 - 5.0 g/dL   AST 16 15 - 41 U/L   ALT 12 (L) 14 - 54 U/L   Alkaline Phosphatase 66 38 - 126 U/L   Total Bilirubin 0.4 0.3 - 1.2 mg/dL   GFR calc non Af Amer 44 (L) >60 mL/min   GFR calc Af Amer 51 (L) >60 mL/min   Anion gap 7 5 - 15  Lipase, blood  Result  Value Ref Range   Lipase 26 22 - 51 U/L  POC occult blood, ED  Result Value Ref Range   Fecal Occult Bld POSITIVE (A) NEGATIVE    Labs Review Labs Reviewed  CBC WITH DIFFERENTIAL/PLATELET - Abnormal; Notable for the following:    WBC 14.3 (*)    Platelets 593 (*)    Lymphocytes Relative 9 (*)    Monocytes Relative 16 (*)    Neutro Abs 10.1 (*)     Monocytes Absolute 2.3 (*)    All other components within normal limits  COMPREHENSIVE METABOLIC PANEL - Abnormal; Notable for the following:    Sodium 132 (*)    Glucose, Bld 121 (*)    Creatinine, Ser 1.19 (*)    Calcium 8.5 (*)    Albumin 3.1 (*)    ALT 12 (*)    GFR calc non Af Amer 44 (*)    GFR calc Af Amer 51 (*)    All other components within normal limits  POC OCCULT BLOOD, ED - Abnormal; Notable for the following:    Fecal Occult Bld POSITIVE (*)    All other components within normal limits  CLOSTRIDIUM DIFFICILE BY PCR  LIPASE, BLOOD  URINALYSIS, ROUTINE W REFLEX MICROSCOPIC  OCCULT BLOOD X 1 CARD TO LAB, STOOL    Imaging Review No results found.   EKG Interpretation None       2:34 PM This provider spoke with [facility]. Patient has been seen and assessed by GI, Dr. Geanie Kenning. As per physician, reported that this is chronic - reported that the bloody diarrhea is normal for the patient. This provider spoke with Nevin Bloodgood, NP - reported that patient does not need to get CT of the abdomen and pelvis - reported to discontinue the imaging. Reported that patient will get a stool sample and that patient will increase dose of prednisone from 15 mg to 40 mg. Reported that patient will be seen in the office later this week for follow up.   4:04 PM patient seen and assessed by attending physician, Dr. Rosalyn Gess. Agrees with GI. Reported patient is safe to be discharged home and for patient to follow up with PCP and GI.   MDM   Final diagnoses:  Exacerbation of Crohn's disease, without complications  Lower abdominal pain    Medications  sodium chloride 0.9 % bolus 1,000 mL (0 mLs Intravenous Stopped 12/13/14 1348)  iohexol (OMNIPAQUE) 300 MG/ML solution 50 mL (50 mLs Oral Contrast Given 12/13/14 1303)    Filed Vitals:   12/13/14 1128 12/13/14 1336  BP: 97/62 155/78  Pulse: 74 65  Temp: 99 F (37.2 C) 98.4 F (36.9 C)  TempSrc: Oral Oral  Resp: 17  19  SpO2: 95% 96%    [place] elevated white blood cell count of 14.3-patient is on chronic steroids. Hemoglobin 14.7, hematocrit 44.3. Elevated platelets of 593. CMP noted mildly low sodium of 132 - when compared to previous labs, patient appears to be this way in the past, no change. Creatinine mildly elevated at 1.19 - when compared to previous labs patient's Creatinine has been as elevated as 1.14.  Lipase negative elevation. Fecal occult positive. UA noted trace of Hgb and trace of leukocytes - negative findings of infection.  Negative findings of UTI or pyelonephritis. Fecal occult positive, negative gross blood noted on exam - negative drop in Hgb at this time. Negative signs of peritonitis. Patient seen and assessed by GI Nevin Bloodgood, NP and Dr. Carlean Purl. Reported  that this is chronic for the patient. Reported that patient can be discharge home. Reported that CT abdomen and pelvis is not needed. Reported that patient is to increase prednisone to 40 mg daily. Reported that patient will be followed as an outpatient. Stool culture ordered by NP. Patient stable, afebrile. Patient not septic appearing. Negative signs of respiratory distress. Patient seen and assessed by attending physician, Dr. Rosalyn Gess - agreed to plan of discharge. Discharged patient. Discussed with patient to rest and stay hydrated. Discussed with patient, since unable to provider a stool sample, to bring stool sample to GI office tomorrow to drop off. Discussed with patient to closely monitor symptoms and if symptoms are to worsen or change to report back to the ED - strict return instructions given.  Patient agreed to plan of care, understood, all questions answered.   Jamse Mead, PA-C 12/13/14 1643  Elnora Morrison, MD 12/13/14 1710

## 2014-12-13 NOTE — Telephone Encounter (Signed)
Patient reports that she had diarrhea,  abdominal pain, pain and bleeding.  She had the Entyvio infusion on 12/03/14.  She is currently on 15 mg of prednisone.  She decreased to this dose of prednisone on 5/12

## 2014-12-13 NOTE — Discharge Instructions (Signed)
Please call your doctor for a followup appointment within 24-48 hours. When you talk to your doctor please let them know that you were seen in the emergency department and have them acquire all of your records so that they can discuss the findings with you and formulate a treatment plan to fully care for your new and ongoing problems. Please call and set-up an appointment with your primary care provider  Please call and set-up an appointment with your gastroenterologist Please take Prednisone 40 mg daily as per recommendation from gastroenterology Please drink plenty of water and stay hydrated Please continue to monitor symptoms closely and if symptoms are to worsen or change (fever greater than 101, chills, sweating, nausea, vomiting, chest pain, shortness of breathe, difficulty breathing, weakness, numbness, tingling, worsening or changes to pain pattern, worsening or changes to abdominal pain, bright red blood in the stools, black tarry stools, inability to keep food fluids down, fainting, dizziness, confusion, disorientation) please report back to the Emergency Department immediately.   Crohn Disease Crohn disease is a long-term (chronic) soreness and redness (inflammation) of the intestines (bowel). It can affect any portion of the digestive tract, from the mouth to the anus. It can also cause problems outside the digestive tract. Crohn disease is closely related to a disease called ulcerative colitis (together, these two diseases are called inflammatory bowel disease).  CAUSES  The cause of Crohn disease is not known. One Link Snuffer is that, in an easily affected person, the immune system is triggered to attack the body's own digestive tissue. Crohn disease runs in families. It seems to be more common in certain geographic areas and amongst certain races. There are no clear-cut dietary causes.  SYMPTOMS  Crohn disease can cause many different symptoms since it can affect many different parts of the  body. Symptoms include:  Fatigue.  Weight loss.  Chronic diarrhea, sometime bloody.  Abdominal pain and cramps.  Fever.  Ulcers or canker sores in the mouth or rectum.  Anemia (low red blood cells).  Arthritis, skin problems, and eye problems may occur. Complications of Crohn disease can include:  Series of holes (perforation) of the bowel.  Portions of the intestines sticking to each other (adhesions).  Obstruction of the bowel.  Fistula formation, typically in the rectal area but also in other areas. A fistula is an opening between the bowels and the outside, or between the bowels and another organ.  A painful crack in the mucous membrane of the anus (rectal fissure). DIAGNOSIS  Your caregiver may suspect Crohn disease based on your symptoms and an exam. Blood tests may confirm that there is a problem. You may be asked to submit a stool specimen for examination. X-rays and CT scans may be necessary. Ultimately, the diagnosis is usually made after a procedure that uses a flexible tube that is inserted via your mouth or your anus. This is done under sedation and is called either an upper endoscopy or colonoscopy. With these tests, the specialist can take tiny tissue samples and remove them from the inside of the bowel (biopsy). Examination of this biopsy tissue under a microscope can reveal Crohn disease as the cause of your symptoms. Due to the many different forms that Crohn disease can take, symptoms may be present for several years before a diagnosis is made. TREATMENT  Medications are often used to decrease inflammation and control the immune system. These include medicines related to aspirin, steroid medications, and newer and stronger medications to slow down the immune  system. Some medications may be used as suppositories or enemas. A number of other medications are used or have been studied. Your caregiver will make specific recommendations. HOME CARE INSTRUCTIONS    Symptoms such as diarrhea can be controlled with medications. Avoid foods that have a laxative effect such as fresh fruit, vegetables, and dairy products. During flare-ups, you can rest your bowel by refraining from solid foods. Drink clear liquids frequently during the day. (Electrolyte or rehydrating fluids are best. Your caregiver can help you with suggestions.) Drink often to prevent loss of body fluids (dehydration). When diarrhea has cleared, eat small meals and more frequently. Avoid food additives and stimulants such as caffeine (coffee, tea, or chocolate). Enzyme supplements may help if you develop intolerance to a sugar in dairy products (lactose). Ask your caregiver or dietitian about specific dietary instructions.  Try to maintain a positive attitude. Learn relaxation techniques such as self-hypnosis, mental imaging, and muscle relaxation.  If possible, avoid stresses which can aggravate your condition.  Exercise regularly.  Follow your diet.  Always get plenty of rest. SEEK MEDICAL CARE IF:   Your symptoms fail to improve after a week or two of new treatment.  You experience continued weight loss.  You have ongoing cramps or loose bowels.  You develop a new skin rash, skin sores, or eye problems. SEEK IMMEDIATE MEDICAL CARE IF:   You have worsening of your symptoms or develop new symptoms.  You have a fever.  You develop bloody diarrhea.  You develop severe abdominal pain. MAKE SURE YOU:   Understand these instructions.  Will watch your condition.  Will get help right away if you are not doing well or get worse. Document Released: 04/18/2005 Document Revised: 11/23/2013 Document Reviewed: 03/17/2007 [facility] Patient Information 2015 [place], [place]. This information is not intended to replace advice given to you by your health care provider. Make sure you discuss any questions you have with your health care provider.

## 2014-12-13 NOTE — ED Notes (Signed)
Pt went to restroom but was unable to give urine sample.

## 2014-12-13 NOTE — Telephone Encounter (Signed)
She reports that she is passing some clots and some dark blood.  She is passing more blood than stool and has 5 episodes already today. Bleeding started more heavily yesterday. Reports "extremely foul odor"

## 2014-12-13 NOTE — ED Notes (Addendum)
Pt was sent by GI doctor for possible Crohn's flare up. Has had increased diarrhea with blood (since Friday) and nausea. Says blood coloration ranges from dark to bright red blood. Denies vomiting. Started a new infusion for Crohn's -had second treatment on Friday. Medication is Entyvio. Reports increased fatigue, dry mouth, and abdominal cramping (takes Bentyl with some alleviation). Denies fevers. No other c/c.

## 2014-12-13 NOTE — Consult Note (Signed)
.     Consultation  Referring Provider:     Jamse Mead, P.A in ED  Primary Care Physician:  Olga Millers, MD Primary Gastroenterologist:  [name], MD       Reason for Consultation:   Rectal bleeding           HPI:   [name] [name] is a 74 y.o. female very well known to Dr. Carlean Purl for Crohn's colitis. She has been steroid dependent. Patient failed Remicade. She has two infusions of Entyvio and feels that since starting the biologic her rectal bleeding has increased significantly. She passes bright red and dark red blood numerous times a day. Her abdominal cramps are chronic, she takes Bentyl for those.   Past Medical History  Diagnosis Date  . Crohn's colitis   . Hypertension   . Colon polyps     adenomatous polyp June 2012  . Complication of anesthesia     slow to wake  . Blood transfusion     1966  . Headache(784.0)     hx migraines  . Arthritis     shoulder  . Bell's palsy   . GERD (gastroesophageal reflux disease)   . Dry eyes     left eye worst one    Past Surgical History  Procedure Laterality Date  . Cholecystectomy    . Abdominal hysterectomy    . Tonsillectomy    . Shoulder surgery      right  . Foot ganglion excision      left  . Appendectomy    . Flexible sigmoidoscopy  06/08/2011    severe Crohn's colitis to descending colon at least  . Colonoscopy  multiple  . Flex sigmoidoscopy with biospy  01/17/12  . Cataract extraction Bilateral     Separate dates    Family History  Problem Relation Age of Onset  . Leukemia Mother   . Heart disease Mother   . Anesthesia problems Neg Hx   . Hypotension Neg Hx   . Malignant hyperthermia Neg Hx   . Pseudochol deficiency Neg Hx   . Colon cancer Neg Hx   . Diabetes Neg Hx   . Heart disease Father   . Breast cancer Paternal Aunt     1/2 aunt  . Throat cancer Paternal Aunt     1/2 aunt  . Ovarian cancer Paternal Aunt     1/2 aunt  . Parkinsonism Brother   . Heart disease Brother   .  Leukemia Maternal Grandfather   . Heart attack Father      History  Substance Use Topics  . Smoking status: Former Smoker    Types: Cigarettes    Quit date: 09/26/1968  . Smokeless tobacco: Never Used  . Alcohol Use: No     Comment: rare wine    Prior to Admission medications   Medication Sig Start Date End Date Taking? Authorizing Provider  amLODipine (NORVASC) 5 MG tablet Take 1 tablet (5 mg total) by mouth at bedtime. 12/07/14  Yes Olga Millers, MD  b complex vitamins capsule Take 1 capsule by mouth daily.     Yes Historical Provider, MD  BYSTOLIC 10 MG tablet Take 1 tablet (10 mg total) by mouth daily. 12/07/14  Yes Olga Millers, MD  Calcium Carbonate-Vit D-Min (CALCIUM 1200) 1200-1000 MG-UNIT CHEW Take 1 chew by mouth once daily Patient taking differently: Chew 1 tablet by mouth daily.  09/24/11  Yes [name], MD  carboxymethylcellulose (REFRESH PLUS) 0.5 %  SOLN Place 1 drop into both eyes 3 (three) times daily as needed (dry eyes).    Yes Historical Provider, MD  cetirizine (ZYRTEC) 10 MG tablet Take 10 mg by mouth daily as needed for allergies.    Yes Historical Provider, MD  dicyclomine (BENTYL) 20 MG tablet Take 1 tablet (20 mg total) by mouth 3 (three) times daily. For stomach pain 08/09/14  Yes [name], MD  diphenoxylate-atropine (LOMOTIL) 2.5-0.025 MG per tablet TAKE 1 TO 2 TABLETS EVERY 4 HOURS AS NEEDED FOR DIARRHEA Patient taking differently: Take 1-2 tablets by mouth every 4 (four) hours as needed for diarrhea or loose stools.  08/06/14  Yes [name], MD  Flaxseed, Linseed, (FLAX SEED OIL) 1000 MG CAPS Take 1,000 mg by mouth daily.   Yes Historical Provider, MD  Multiple Vitamins-Minerals (MULTIVITAMINS THER. W/MINERALS) TABS Take 1 tablet by mouth daily.     Yes Historical Provider, MD  ondansetron (ZOFRAN) 4 MG tablet Take 1 tablet (4 mg total) by mouth every 8 (eight) hours as needed for nausea or vomiting. 09/21/14  Yes Jessica D Zehr, PA-C    pantoprazole (PROTONIX) 40 MG tablet Take 1 tablet (40 mg total) by mouth daily. 12/07/14  Yes Olga Millers, MD  predniSONE (DELTASONE) 10 MG tablet Take 2 tablets (20 mg total) by mouth daily. Patient taking differently: Take 15 mg by mouth daily.  10/11/14  Yes [name], MD  vedolizumab (ENTYVIO) 300 MG injection Inject 300 mg into the vein.   Yes Historical Provider, MD  Cholecalciferol (VITAMIN D) 2000 UNITS CAPS Take 1 capsule (2,000 Units total) by mouth daily. Patient not taking: Reported on 12/13/2014 09/24/11   [name], MD    No current facility-administered medications for this encounter.   Current Outpatient Prescriptions  Medication Sig Dispense Refill  . amLODipine (NORVASC) 5 MG tablet Take 1 tablet (5 mg total) by mouth at bedtime. 90 tablet 3  . b complex vitamins capsule Take 1 capsule by mouth daily.      Marland Kitchen BYSTOLIC 10 MG tablet Take 1 tablet (10 mg total) by mouth daily. 90 tablet 2  . Calcium Carbonate-Vit D-Min (CALCIUM 1200) 1200-1000 MG-UNIT CHEW Take 1 chew by mouth once daily (Patient taking differently: Chew 1 tablet by mouth daily. ) 1 each 0  . carboxymethylcellulose (REFRESH PLUS) 0.5 % SOLN Place 1 drop into both eyes 3 (three) times daily as needed (dry eyes).     . cetirizine (ZYRTEC) 10 MG tablet Take 10 mg by mouth daily as needed for allergies.     Marland Kitchen dicyclomine (BENTYL) 20 MG tablet Take 1 tablet (20 mg total) by mouth 3 (three) times daily. For stomach pain 90 tablet 5  . diphenoxylate-atropine (LOMOTIL) 2.5-0.025 MG per tablet TAKE 1 TO 2 TABLETS EVERY 4 HOURS AS NEEDED FOR DIARRHEA (Patient taking differently: Take 1-2 tablets by mouth every 4 (four) hours as needed for diarrhea or loose stools. ) 90 tablet 11  . Flaxseed, Linseed, (FLAX SEED OIL) 1000 MG CAPS Take 1,000 mg by mouth daily.    . Multiple Vitamins-Minerals (MULTIVITAMINS THER. W/MINERALS) TABS Take 1 tablet by mouth daily.      . ondansetron (ZOFRAN) 4 MG tablet Take 1 tablet  (4 mg total) by mouth every 8 (eight) hours as needed for nausea or vomiting. 30 tablet 1  . pantoprazole (PROTONIX) 40 MG tablet Take 1 tablet (40 mg total) by mouth daily. 90 tablet 3  . predniSONE (DELTASONE)  10 MG tablet Take 2 tablets (20 mg total) by mouth daily. (Patient taking differently: Take 15 mg by mouth daily. ) 100 tablet 0  . vedolizumab (ENTYVIO) 300 MG injection Inject 300 mg into the vein.    . Cholecalciferol (VITAMIN D) 2000 UNITS CAPS Take 1 capsule (2,000 Units total) by mouth daily. (Patient not taking: Reported on 12/13/2014) 1 capsule 0    Allergies as of 12/13/2014 - Review Complete 12/13/2014  Allergen Reaction Noted  . Azathioprine Nausea Only 08/25/2012  . Adhesive [tape] Rash 06/05/2011  . Sulfa antibiotics Itching and Rash 06/01/2011    Review of Systems:    All systems reviewed and negative except where noted in HPI.    Physical Exam:  Vital signs in last 24 hours: Temp:  [98.4 F (36.9 C)-99 F (37.2 C)] 98.4 F (36.9 C) (05/23 1336) Pulse Rate:  [65-74] 65 (05/23 1336) Resp:  [17-19] 19 (05/23 1336) BP: (97-155)/(62-78) 155/78 mmHg (05/23 1336) SpO2:  [95 %-96 %] 96 % (05/23 1336)   General:   Pleasant white female in NAD Head:  Normocephalic and atraumatic. Eyes:   No icterus.   Conjunctiva pink. Ears:  Normal auditory acuity. Neck:  Supple; no masses felt Lungs:  Respirations even and unlabored. Lungs clear to auscultation bilaterally.   No wheezes, crackles, or rhonchi.  Heart:  Regular rate and rhythm;  murmur heard. Abdomen:  Soft, nondistended, mild mid lower abdominal tenderness. Normal bowel sounds. No appreciable masses or hepatomegaly.  Msk:  Symmetrical without gross deformities.  Extremities:  Without edema. Neurologic:  Alert and  oriented x4;  grossly normal neurologically. Skin:  Intact without significant lesions or rashes. Cervical Nodes:  No significant cervical adenopathy. Psych:  Alert and cooperative. Normal  affect.  LAB RESULTS:  Recent Labs  12/13/14 1142  WBC 14.3*  HGB 14.7  HCT 44.3  PLT 593*   BMET  Recent Labs  12/13/14 1142  NA 132*  K 3.5  CL 101  CO2 24  GLUCOSE 121*  BUN 10  CREATININE 1.19*  CALCIUM 8.5*   LFT  Recent Labs  12/13/14 1142  PROT 6.8  ALBUMIN 3.1*  AST 16  ALT 12*  ALKPHOS 66  BILITOT 0.4      Impression / Plan:    Pleasant 74 year old female with steroid dependent Crohn's colitis.  Patient failed Remicade. She has only had two doses of Entyvio but feels worse after initiation of the biologic. Her stools are looser than normal. Rule out C-diff, she took a course of antibiotics not too long ago (tick bite). Unclear why the increased rectal bleeding. She may have worsening Crohn's colitis. No need for CTscan. Patient can be discharged from ED, we will follow up on C-diff results and call patient. She should increase Prednisone to 71m daily until further notice. I spoke with MJamse Mead P.A in the ED about these plans. Appreciate her assistance.     PTye [name] 12/13/2014, 1:40 PM

## 2014-12-13 NOTE — ED Notes (Signed)
Pt unable to use restroom at this time. Will info staff when can

## 2014-12-13 NOTE — Telephone Encounter (Signed)
Please tell her to go to ED

## 2014-12-14 ENCOUNTER — Other Ambulatory Visit: Payer: Medicare Other

## 2014-12-14 ENCOUNTER — Telehealth: Payer: Self-pay | Admitting: Internal Medicine

## 2014-12-14 ENCOUNTER — Other Ambulatory Visit: Payer: Self-pay

## 2014-12-14 DIAGNOSIS — R197 Diarrhea, unspecified: Secondary | ICD-10-CM

## 2014-12-14 NOTE — Telephone Encounter (Signed)
Dr. Carlean [name] when am I scheduling her.  [name] [name] is in next week.  Is that ok?

## 2014-12-14 NOTE — Telephone Encounter (Signed)
That was the plan - [name] [name]

## 2014-12-14 NOTE — Telephone Encounter (Signed)
Patient is scheduled for [name] [name] for 12/22/14 1:30 She dropped off her stool specimen for c-diff this am

## 2014-12-15 LAB — CLOSTRIDIUM DIFFICILE BY PCR: Toxigenic C. Difficile by PCR: NOT DETECTED

## 2014-12-15 NOTE — Progress Notes (Signed)
Quick Note:  C diff PCR negative so tells [place] Crohn's colitis is flaring Stay on 40 mg prednisone until she sees [place] ______

## 2014-12-22 ENCOUNTER — Ambulatory Visit (INDEPENDENT_AMBULATORY_CARE_PROVIDER_SITE_OTHER): Payer: Medicare Other | Admitting: Nurse Practitioner

## 2014-12-22 ENCOUNTER — Encounter: Payer: Self-pay | Admitting: Nurse Practitioner

## 2014-12-22 VITALS — BP 122/70 | HR 60 | Ht 66.0 in | Wt 180.6 lb

## 2014-12-22 DIAGNOSIS — K50111 Crohn's disease of large intestine with rectal bleeding: Secondary | ICD-10-CM

## 2014-12-22 NOTE — Patient Instructions (Addendum)
Prednisone taper: You are currently taking 40 mg daily Tomorrow reduce dose to 35 mg x 5 days then  30 mg x 5 days. If no recurrent diarrhea or bleeding  reduce to 36m x 5 day. Following that, return to  your previous dose of 170mdaily and call us[place] a condiition update and to get additional instructions on tapering    We will see you at your next appointment with Dr GeCarlean Purln July 21st at 3:15pm    I appreciate the opportunity to care for you.

## 2014-12-22 NOTE — Progress Notes (Signed)
     History of Present Illness:   [name] [name] is a 74 y.o. female very well known to [name] [name] for left sided Crohn's colitis. She has been steroid dependent. Patient failed Remicade. Recently started [facility] but is convinced it made her rectal bleeding worse. Patient recently evaluated in ED for rectal bleeding. [name] [name] and I actually saw her together while she was in ED. Patient complained of loose stool with blood. In setting of recent antibiotics a c-diff was checked. She was discharged from ED with instructions to increase home prednisone from 35m to 428mwhile awaiting c-diff results. C-diff was negative. Patient here for follow up.   Patient's rectal bleeding finally slowed then ceased a few days ago. She is actually feeling okay except the Prednisone is making her nervous, interfering with sleep, and causing ankle swelling. Diarrhea has resolved - using Lomotil. Next dose of Entyvio is 12/31/14.     Current Medications, Allergies, Past Medical History, Past Surgical History, Family History and Social History were reviewed in [employer].   Physical Exam: General: Pleasant, well developed , white female in no acute distress Head: Normocephalic and atraumatic Eyes:  sclerae anicteric, conjunctiva pink  Ears: Normal auditory acuity Lungs: Clear throughout to auscultation Heart: Regular rate and rhythm Abdomen: Soft, non distended, mild LLQ tenderness.  No masses, no hepatomegaly. Normal bowel sounds Musculoskeletal: Symmetrical with no gross deformities  Extremities: No edema  Neurological: Alert oriented x 4, grossly nonfocal Psychological:  Alert and cooperative. Normal mood and affect  Assessment and Recommendations:   [age]ear old female with left-sided Crohn's colitis. Failed Remicade. [facility] We have been tapering Prednisone (got down to 1562maily) but increased dose back to 81m94maily after patient went to ED 12/13/14 with progressive diarrhea / rectal bleeding. C-diff was negative. Diarrhea and bleeding resolved with lomotil and high dose prednisone but she is nervous, can't sleep and has ankle swelling. Begin slow Prednisone taper as follows: 35mg71mly for 5 days then 30mg 42my for 5 days then  20mg d27m for 5 days then 15mg da57mfor 5 days then call us with [place] update and further tapering instructions.   >Next Entyvio infusion 6/10.  >Follow up with Dr. [name] Carlean Purlweeks.

## 2014-12-24 NOTE — Progress Notes (Signed)
Agree with [name]'s assessment and plan. [name], MD, [name]

## 2014-12-31 ENCOUNTER — Telehealth: Payer: Self-pay

## 2014-12-31 ENCOUNTER — Encounter (HOSPITAL_COMMUNITY): Payer: Self-pay

## 2014-12-31 ENCOUNTER — Encounter (HOSPITAL_COMMUNITY)
Admission: RE | Admit: 2014-12-31 | Discharge: 2014-12-31 | Disposition: A | Discharge status: Home / Self Care | Payer: Medicare Other | Source: Ambulatory Visit | Attending: Internal Medicine | Admitting: Internal Medicine

## 2014-12-31 DIAGNOSIS — K50118 Crohn's disease of large intestine with other complication: Secondary | ICD-10-CM | POA: Insufficient documentation

## 2014-12-31 NOTE — Telephone Encounter (Signed)
Doc of the Day Santiago Glad wants to know if the patient can still take her infusion. Patient is telling her about multiple ER visit and LE edema. Nurse says the patient get SOB walking a distance. Patient is on 30 mg of prednisone. She was seen here 12/22/14 and evaluated. Please advise.

## 2014-12-31 NOTE — Progress Notes (Signed)
Pt c/o shortness of breath and"feels badly".  States has been in emergency dept. Twice since last her  Entyvio, and states was seen there by Dr Carlean Purl. States had swelling bilateral lower extremities after last infusion and she says she did notify office. Called placed to gessner office, spoke with [facility] office nurse.  Dr Carlean Purl off this week, she will ask doctor taking call today and call me back.

## 2014-12-31 NOTE — Telephone Encounter (Signed)
FYI - Spoke with Santiago Glad, RN again. She will ask the patient if she wants to go to the ER for evaluation of SOB.

## 2014-12-31 NOTE — Telephone Encounter (Signed)
If shortness of breath is new, cancel infusion therapy and go to emergency room for evaluation of shortness of breath. If shortness of breath his old, stable, and has been evaluated without significant cause, then proceed with infusion therapy. Complaints of ankle edema are old (I reviewed recent office visit) and Dr. Carlean Purl aware.

## 2014-12-31 NOTE — Progress Notes (Signed)
Per Beth with Dr Henrene Pastor, if pt short of breath and new problem,  cancel today's  treatment and ask patient to go  emergency room, if not new problem , may use nurse judgement and proceed with treatment.  Talked over with patient and she feels like shortness of breath and swelling of lower extremities was due to treatment as "it occurred after last infusion" and per [facility], CHF is one of the side effects.  Pt declines to go to emergency, as she states she feels better now, she will call Dr Carlean Purl on Monday and treatment is rescheduled for Thurs.06.16.2016 at 12noon.  Pt will go to emergency room if sx change.

## 2015-01-01 NOTE — Telephone Encounter (Signed)
Please arrange echocardiogram week of 6/13 - before she may have another Entyvio infusion

## 2015-01-03 ENCOUNTER — Encounter: Payer: Self-pay | Admitting: Physician Assistant

## 2015-01-03 ENCOUNTER — Other Ambulatory Visit (INDEPENDENT_AMBULATORY_CARE_PROVIDER_SITE_OTHER): Payer: Medicare Other

## 2015-01-03 ENCOUNTER — Ambulatory Visit (INDEPENDENT_AMBULATORY_CARE_PROVIDER_SITE_OTHER)
Admission: RE | Admit: 2015-01-03 | Discharge: 2015-01-03 | Disposition: A | Discharge status: Home / Self Care | Payer: Medicare Other | Source: Ambulatory Visit | Attending: Physician Assistant | Admitting: Physician Assistant

## 2015-01-03 ENCOUNTER — Telehealth: Payer: Self-pay | Admitting: Internal Medicine

## 2015-01-03 ENCOUNTER — Ambulatory Visit (INDEPENDENT_AMBULATORY_CARE_PROVIDER_SITE_OTHER): Payer: Medicare Other | Admitting: Physician Assistant

## 2015-01-03 VITALS — BP 134/60 | HR 60 | Ht 65.5 in | Wt 184.2 lb

## 2015-01-03 DIAGNOSIS — K50119 Crohn's disease of large intestine with unspecified complications: Secondary | ICD-10-CM

## 2015-01-03 DIAGNOSIS — R0602 Shortness of breath: Secondary | ICD-10-CM

## 2015-01-03 DIAGNOSIS — R5382 Chronic fatigue, unspecified: Secondary | ICD-10-CM

## 2015-01-03 DIAGNOSIS — R609 Edema, unspecified: Secondary | ICD-10-CM

## 2015-01-03 DIAGNOSIS — R05 Cough: Secondary | ICD-10-CM | POA: Diagnosis not present

## 2015-01-03 DIAGNOSIS — R0600 Dyspnea, unspecified: Secondary | ICD-10-CM

## 2015-01-03 LAB — BRAIN NATRIURETIC PEPTIDE: Pro B Natriuretic peptide (BNP): 188 pg/mL — ABNORMAL HIGH (ref 0.0–100.0)

## 2015-01-03 LAB — COMPREHENSIVE METABOLIC PANEL
ALK PHOS: 48 U/L (ref 39–117)
ALT: 21 U/L (ref 0–35)
AST: 17 U/L (ref 0–37)
Albumin: 3.7 g/dL (ref 3.5–5.2)
BILIRUBIN TOTAL: 0.3 mg/dL (ref 0.2–1.2)
BUN: 18 mg/dL (ref 6–23)
CO2: 28 mEq/L (ref 19–32)
Calcium: 9.2 mg/dL (ref 8.4–10.5)
Chloride: 98 mEq/L (ref 96–112)
Creatinine, Ser: 1.11 mg/dL (ref 0.40–1.20)
GFR: 51.18 mL/min — ABNORMAL LOW (ref >60.00)
Glucose, Bld: 219 mg/dL — ABNORMAL HIGH (ref 70–99)
Potassium: 3.8 mEq/L (ref 3.5–5.1)
Sodium: 133 mEq/L — ABNORMAL LOW (ref 135–145)
Total Protein: 6.7 g/dL (ref 6.0–8.3)

## 2015-01-03 LAB — CBC WITH DIFFERENTIAL/PLATELET
Basophils Absolute: 0.1 10*3/uL (ref 0.0–0.1)
Basophils Relative: 0.6 % (ref 0.0–3.0)
Eosinophils Absolute: 0 10*3/uL (ref 0.0–0.7)
Eosinophils Relative: 0.2 % (ref 0.0–5.0)
HEMATOCRIT: 44.3 % (ref 36.0–46.0)
Hemoglobin: 14.5 g/dL (ref 12.0–15.0)
LYMPHS PCT: 4.4 % — AB (ref 12.0–46.0)
Lymphs Abs: 0.6 10*3/uL — ABNORMAL LOW (ref 0.7–4.0)
MCHC: 32.7 g/dL (ref 30.0–36.0)
MCV: 94.4 fl (ref 78.0–100.0)
Monocytes Absolute: 0.2 10*3/uL (ref 0.1–1.0)
Monocytes Relative: 1.3 % — ABNORMAL LOW (ref 3.0–12.0)
Neutro Abs: 13.5 10*3/uL — ABNORMAL HIGH (ref 1.4–7.7)
Neutrophils Relative %: 93.5 % — ABNORMAL HIGH (ref 43.0–77.0)
PLATELETS: 433 10*3/uL — AB (ref 150.0–400.0)
RBC: 4.69 Mil/uL (ref 3.87–5.11)
RDW: 14.1 % (ref 11.5–15.5)
WBC: 14.4 10*3/uL — AB (ref 4.0–10.5)

## 2015-01-03 LAB — SEDIMENTATION RATE: SED RATE: 8 mm/h (ref 0–22)

## 2015-01-03 LAB — C-REACTIVE PROTEIN: CRP: 0.4 mg/dL — AB (ref 0.5–20.0)

## 2015-01-03 LAB — CORTISOL: CORTISOL PLASMA: 5.1 ug/dL

## 2015-01-03 LAB — URIC ACID: URIC ACID, SERUM: 5.6 mg/dL (ref 2.4–7.0)

## 2015-01-03 LAB — TSH: TSH: 0.65 u[IU]/mL (ref 0.35–4.50)

## 2015-01-03 NOTE — Telephone Encounter (Signed)
ECHO is scheduled for Friday 01/07/15 at 3:00 at [facility] She is scheduled for an office visit today at 2:30 with Nicoletta Ba PA

## 2015-01-03 NOTE — Telephone Encounter (Signed)
See phone note from 12/31/14

## 2015-01-03 NOTE — Progress Notes (Signed)
Patient ID: [name] [name], female   DOB: 12-15-1940, 74 y.o.   MRN: [id number]   Subjective:    Patient ID: [name] [name], female    DOB: [birth year], 74 y.o.   MRN: [id number]  HPI [name] [name] is a pleasant 74 year old white female well known to Dr. Carlean Purl with history of Crohn's colitis. She has been steroid dependent, and previously failed Remicade. She had an exacerbation of her Crohn's in the past few months and was initiated on Entyvio for which she has had 2 infusions. Patient has also been on prednisone at 40 mg by mouth daily over the past couple of weeks with gradual taper. She says she has felt "terrible" ever since she started the and tibia oh. She says that she has absolutely no energy and felt like she had been "run over by a truck" after the first infusion. She has developed generalized weakness, and has now developed fluid retention peripherally in her lower extremities. She has also complained of feeling short of breath. She was to have an infusion late last week but this was held because of her complaints of shortness of breath. On careful questioning she has not had any fever or chills or cough. She says she has had some mild dyspnea long-term but feels that this is a little bit worse over the past few weeks. She denies any chest pain. She says she feels very anxious and jittery on higher dose prednisone and says sometimes she "can't stand herself". On the positive side her Crohn symptoms have significantly improved over the past couple of weeks. She's not having any abdominal pain or cramping at this point the diarrhea has resolved. She is not seeing any blood. She also mentions having problems with low sodium levels in the past and wonders if this is part of her problem. Her weight is up 4-5 pounds since her last office visit.  Review of Systems Pertinent positive and negative review of systems were noted in the above HPI section.  All other review of systems was otherwise  negative.  Outpatient Encounter Prescriptions as of 01/03/2015  Medication Sig  . amLODipine (NORVASC) 5 MG tablet Take 1 tablet (5 mg total) by mouth at bedtime.  Marland Kitchen b complex vitamins capsule Take 1 capsule by mouth daily.    Marland Kitchen BYSTOLIC 10 MG tablet Take 1 tablet (10 mg total) by mouth daily.  . Calcium Carbonate-Vit D-Min (CALCIUM 1200) 1200-1000 MG-UNIT CHEW Take 1 chew by mouth once daily (Patient taking differently: Chew 1 tablet by mouth daily. )  . carboxymethylcellulose (REFRESH PLUS) 0.5 % SOLN Place 1 drop into both eyes 3 (three) times daily as needed (dry eyes).   . cetirizine (ZYRTEC) 10 MG tablet Take 10 mg by mouth daily as needed for allergies.   . Cholecalciferol (VITAMIN D) 2000 UNITS CAPS Take 1 capsule (2,000 Units total) by mouth daily.  Marland Kitchen dicyclomine (BENTYL) 20 MG tablet Take 1 tablet (20 mg total) by mouth 3 (three) times daily. For stomach pain  . diphenoxylate-atropine (LOMOTIL) 2.5-0.025 MG per tablet TAKE 1 TO 2 TABLETS EVERY 4 HOURS AS NEEDED FOR DIARRHEA (Patient taking differently: Take 1-2 tablets by mouth every 4 (four) hours as needed for diarrhea or loose stools. )  . Flaxseed, Linseed, (FLAX SEED OIL) 1000 MG CAPS Take 1,000 mg by mouth daily.  . Multiple Vitamins-Minerals (MULTIVITAMINS THER. W/MINERALS) TABS Take 1 tablet by mouth daily.    . ondansetron (ZOFRAN) 4 MG tablet Take 1 tablet (4 mg total)  by mouth every 8 (eight) hours as needed for nausea or vomiting.  . pantoprazole (PROTONIX) 40 MG tablet Take 1 tablet (40 mg total) by mouth daily.  . predniSONE (DELTASONE) 10 MG tablet Take 2 tablets (20 mg total) by mouth daily. (Patient taking differently: Take 40 mg by mouth daily. Pt takes 2.5 tablets a day (25 mg) a day until 01/08/15; then 20 mg week after)  . vedolizumab (ENTYVIO) 300 MG injection Inject 300 mg into the vein.   No facility-administered encounter medications on file as of 01/03/2015.   Allergies  Allergen Reactions  . Azathioprine  Nausea Only    Elevated lipase also - ? Pancreatitis   . Adhesive [Tape] Rash    Taking skin off  . Sulfa Antibiotics Itching and Rash   Patient Active Problem List   Diagnosis Date Noted  . Hyperglycemia 10/11/2014  . Esophageal reflux 09/22/2014  . Long term current use of systemic steroids - prednisone 08/25/2012  . Osteopenia 12/25/2011  . Immunosuppression - on chronic prednisone, hx of Remicade and azathioprine 07/09/2011  . Crohn's colitis - left -sided   . Hypertension   . Arthritis    History   Social History  . Marital Status: Widowed    Spouse Name: [name] [name]  . Number of Children: 3  . Years of Education: [name] [name]   Occupational History  . retired     Social History Main Topics  . Smoking status: Former Smoker    Types: Cigarettes    Quit date: 09/26/1968  . Smokeless tobacco: Never Used  . Alcohol Use: No     Comment: rare wine  . Drug Use: No  . Sexual Activity: No   Other Topics Concern  . Not on file   Social History Narrative    Ms. Bencosme's family history includes Breast cancer in her paternal aunt; Heart attack in her father; Heart disease in her brother, father, and mother; Leukemia in her maternal grandfather and mother; Ovarian cancer in her paternal aunt; Parkinsonism in her brother; Throat cancer in her paternal aunt. There is no history of Anesthesia problems, Hypotension, Malignant hyperthermia, Pseudochol deficiency, Colon cancer, or Diabetes.      Objective:    Filed Vitals:   01/03/15 1419  BP: 134/60  Pulse: 60    Physical Exam Well-developed older white female in no acute distress, pleasant blood pressure 134/60 pulse 60 height 5 foot 5 weight 184. HEENT; nontraumatic normocephalic EOMI PERRLA sclera anicteric, Supple ;no JVD, Cardiovascular ;regular rate and rhythm with S1-S2 no murmur rub or gallop, Pulmonary ;few basilar Rales left greater than right, Abdomen soft basically nontender is no palpable mass or hepatosplenomegaly bowel  sounds are present, Rectal ;exam not done, Ext; clubbing cyanosis  1+ edema bilaterally to shins, Neuropsych ;patient anxious and talkative, exam nonfocal      Assessment & Plan:   #1 74 yo female with Crohns colitis- steroid dependent . Recent exacerbation which has much improved over the past few weeks #2  Fatigue , peripheral edema and dyspnea(chronic but worse)- unclear if related to Entyvio adverse rxn, CHF, or other #3 Hx hyponatremia- ? SIADH #4 HTN  Plan; Stop Entyvio Continue  Prednisone, but decrease to 15 mg po daily x one week then 10 mg po daily  CXR 2D ECHO Check BNP, BMET,CBC,TSH,cortisol, uric acid ,serum and urine osmolality   Further plans pending results of above   Alfredia Ferguson PA-C 01/03/2015   Cc: Olga Millers, MD

## 2015-01-03 NOTE — Telephone Encounter (Signed)
She is scheduled for ECHO for 01/07/15 2:30 with Nicoletta Ba PA

## 2015-01-03 NOTE — Telephone Encounter (Signed)
I called [name] [name], she was to have an Entyvio infusion on Friday 3 days ago was not given because of dyspnea. She's had problems, she attributes feeling poorly to starting this medication in the past couple months. She did seem to have swelling in her problems that could've been a possible hypersensitivity reaction was never clear. She feels a little better today but is still very tired and fatigued. She's been tremulous and agitated on higher doses of prednisone and is down to 25 mg starting today, having been on 30. She is not having bleeding but she still has occasional diarrhea. Her Crohn's colitis has been very difficult to control and the only medication that has been tolerated seems to have been prednisone, things were otherwise ineffective or she was intolerant.  I would like her to have an echocardiogram, hopefully soon. She is due to possibly have an infusion on Thursday, June 16. I would like to try to see her in the office myself or with an advanced practitioner this week prior to the infusion. We may need to hold off on continuing that given the question of problems.

## 2015-01-03 NOTE — Patient Instructions (Signed)
Please go to the basement level to have your labs drawn.  Also go to radiology for a chest x-ray in our basement level. Decrease Prednisone to 15 mg by mouth every morning for 1 week then 10 mg every morning.  Stop Entyvo.

## 2015-01-04 LAB — OSMOLALITY: OSMOLALITY: 291 mosm/kg (ref 275–300)

## 2015-01-05 ENCOUNTER — Other Ambulatory Visit: Payer: Self-pay | Admitting: *Deleted

## 2015-01-05 ENCOUNTER — Other Ambulatory Visit: Payer: Medicare Other

## 2015-01-05 DIAGNOSIS — K50118 Crohn's disease of large intestine with other complication: Secondary | ICD-10-CM

## 2015-01-06 ENCOUNTER — Ambulatory Visit (HOSPITAL_COMMUNITY): Payer: Medicare Other

## 2015-01-06 LAB — OSMOLALITY, URINE: Osmolality, Ur: 422 mOsm/kg (ref 390–1090)

## 2015-01-07 ENCOUNTER — Ambulatory Visit (HOSPITAL_COMMUNITY): Payer: Medicare Other | Attending: Cardiovascular Disease

## 2015-01-07 ENCOUNTER — Other Ambulatory Visit: Payer: Self-pay

## 2015-01-07 DIAGNOSIS — I517 Cardiomegaly: Secondary | ICD-10-CM | POA: Insufficient documentation

## 2015-01-07 DIAGNOSIS — R0600 Dyspnea, unspecified: Secondary | ICD-10-CM

## 2015-01-11 ENCOUNTER — Other Ambulatory Visit: Payer: Self-pay

## 2015-01-11 DIAGNOSIS — R0600 Dyspnea, unspecified: Secondary | ICD-10-CM

## 2015-01-11 NOTE — Progress Notes (Signed)
Quick Note:  The heart is pumping ok I would like her referred to pulmonary re: dyspnea Please also get an update on colitis sxs and current dose of prednisone She has f/u me in July ______

## 2015-01-13 ENCOUNTER — Ambulatory Visit (INDEPENDENT_AMBULATORY_CARE_PROVIDER_SITE_OTHER): Payer: Medicare Other | Admitting: Internal Medicine

## 2015-01-13 ENCOUNTER — Encounter: Payer: Self-pay | Admitting: Internal Medicine

## 2015-01-13 VITALS — BP 114/66 | HR 63 | Ht 66.0 in | Wt 183.8 lb

## 2015-01-13 DIAGNOSIS — J45991 Cough variant asthma: Secondary | ICD-10-CM

## 2015-01-13 MED ORDER — MOMETASONE FURO-FORMOTEROL FUM 100-5 MCG/ACT IN AERO: INHALATION_SPRAY | RESPIRATORY_TRACT | Status: DC

## 2015-01-13 NOTE — Patient Instructions (Addendum)
dulera 100 Take 2 puffs first thing in am and then another 2 puffs about 12 hours later - fill the prescription if you like it    Work on inhaler technique:  relax and gently blow all the way out then take a nice smooth deep breath back in, triggering the inhaler at same time you start breathing in.  Hold for up to 5 seconds if you can.  Rinse and gargle with water when done  Add pepcid ac 20 mg at bedtime  GERD (REFLUX)  is an extremely common cause of respiratory symptoms just like yours , many times with no obvious heartburn at all.    It can be treated with medication, but also with lifestyle changes including elevation of the head of your bed (ideally with 6 inch  bed blocks),  Smoking cessation, avoidance of late meals, excessive alcohol, and avoid fatty foods, chocolate, peppermint, colas, red wine, and acidic juices such as orange juice.  NO MINT OR MENTHOL PRODUCTS SO NO COUGH DROPS  USE SUGARLESS CANDY INSTEAD (Jolley ranchers or Stover's or Life Savers) or even ice chips will also do - the key is to swallow to prevent all throat clearing. NO OIL BASED VITAMINS - use powdered substitutes.    Please schedule a follow up office visit in 4 weeks, sooner if needed  ? Needs hrct for bronchiectasis and do sinus ct too

## 2015-01-13 NOTE — Progress Notes (Signed)
Subjective:    Patient ID: [name] [name], female    DOB: [birth year],   MRN: [id number]  HPI  79 yowf with steroid dep UC quit light smoking in 1970 with sinus problems dating back to 1960s and recurrent bronchitis mostly spring and fall with allergy testing neg in 2014 and more consistent sob year round since 2014  >  Inhalers per Heide Scales  helped a lot and referred  By Dr Carlean Purl to pulmonary clinic 01/13/2015    01/13/2015 1st [facility] office visit/ Siddhi Dornbush   Chief Complaint  Patient presents with  . Pulmonary Consult    Referred by Dr. [name].  Pt c/o SOB for the past 2-3 yrs. She gets SOB walking from room to room at home and sometimes just at rest. She also c/o cough- prod with light green sputum.    last eval by Constance Holster neg x 3 m prior to OV  , dx as gerd/  Does produce some green mucus variable but worse at hs - can't lie flat s cough x months On prednisone daily x years  Coughs so hard she gags but no vomoit. Sob at rest usually related to coughing fits Attributes recent problems to rx for uc and much better since it was stopped but note cough and sob continue and only thing that helped was "purple inhaler" per Golden Hurter.   No obvious other patterns in day to day or daytime variabilty or assoc cp or chest tightness, subjective wheeze overt hb symptoms. No unusual exp hx or h/o childhood pna/ asthma or knowledge of premature birth.  Sleeping ok without nocturnal  or early am exacerbation  of respiratory  c/o's or need for noct saba. Also denies any obvious fluctuation of symptoms with weather or environmental changes or other aggravating or alleviating factors except as outlined above   Current Medications, Allergies, Complete Past Medical History, Past Surgical History, Family History, and Social History were reviewed in [employer] record.                 Review of Systems  Constitutional: Negative for fever, chills and unexpected  weight change.  HENT: Positive for congestion. Negative for dental problem, ear pain, nosebleeds, postnasal drip, rhinorrhea, sinus pressure, sneezing, sore throat, trouble swallowing and voice change.   Eyes: Negative for visual disturbance.  Respiratory: Positive for cough and shortness of breath. Negative for choking.   Cardiovascular: Positive for leg swelling. Negative for chest pain.  Gastrointestinal: Positive for abdominal pain. Negative for vomiting and diarrhea.  Genitourinary: Negative for difficulty urinating.       Acid heartburn  Musculoskeletal: Negative for arthralgias.  Skin: Negative for rash.  Neurological: Negative for tremors, syncope and headaches.  Hematological: Does not bruise/bleed easily.       Objective:   Physical Exam  amb wf nad/ congested sounding cough   Wt Readings from Last 3 Encounters:  01/13/15 183 lb 12.8 oz (83.371 kg)  01/03/15 184 lb 4 oz (83.575 kg)  12/31/14 188 lb (85.276 kg)    Vital signs reviewed   HEENT: nl dentition, turbinates, and orophanx. Nl external ear canals without cough reflex   NECK :  without JVD/Nodes/TM/ nl carotid upstrokes bilaterally   LUNGS: no acc muscle use,  Junky insp and exp rhonchi    CV:  RRR  no s3 or murmur or increase in P2, no edema   ABD:  soft and nontender with nl excursion in the supine position.  No bruits or organomegaly, bowel sounds nl  MS:  warm without deformities, calf tenderness, cyanosis or clubbing  SKIN: warm and dry without lesions    NEURO:  alert, approp, no deficits    I personally reviewed images and agree with radiology impression as follows:  CXR: 01/03/15 No active cardiopulmonary disease.  Labs ordered/ reviewed:     Chemistry      Component Value Date/Time   NA 133* 01/03/2015 1543   K 3.8 01/03/2015 1543   CL 98 01/03/2015 1543   CO2 28 01/03/2015 1543   BUN 18 01/03/2015 1543   CREATININE 1.11 01/03/2015 1543      Component Value Date/Time   CALCIUM  9.2 01/03/2015 1543   ALKPHOS 48 01/03/2015 1543   AST 17 01/03/2015 1543   ALT 21 01/03/2015 1543   BILITOT 0.3 01/03/2015 1543       Lab Results  Component Value Date   WBC 14.4* 01/03/2015   HGB 14.5 01/03/2015   HCT 44.3 01/03/2015   MCV 94.4 01/03/2015   PLT 433.0* 01/03/2015       Lab Results  Component Value Date   TSH 0.65 01/03/2015     Lab Results  Component Value Date   PROBNP 188.0* 01/03/2015     Lab Results  Component Value Date   ESRSEDRATE 8 01/03/2015            Assessment & Plan:

## 2015-01-14 ENCOUNTER — Encounter: Payer: Self-pay | Admitting: Internal Medicine

## 2015-01-14 DIAGNOSIS — J45991 Cough variant asthma: Secondary | ICD-10-CM | POA: Insufficient documentation

## 2015-01-14 NOTE — Assessment & Plan Note (Signed)
The most common causes of chronic cough in immunocompetent adults include the following: upper airway cough syndrome (UACS), previously referred to as postnasal drip syndrome (PNDS), which is caused by variety of rhinosinus conditions; (2) asthma; (3) GERD; (4) chronic bronchitis from cigarette smoking or other inhaled environmental irritants; (5) nonasthmatic eosinophilic bronchitis; and (6) bronchiectasis.   These conditions, singly or in combination, have accounted for up to 94% of the causes of chronic cough in prospective studies.   Other conditions have constituted no >6% of the causes in prospective studies These have included bronchogenic carcinoma, chronic interstitial pneumonia, sarcoidosis, left ventricular failure, ACEI-induced cough, and aspiration from a condition associated with pharyngeal dysfunction.    Chronic cough is often simultaneously caused by more than one condition. A single cause has been found from 38 to 82% of the time, multiple causes from 18 to 62%. Multiply caused cough has been the result of three diseases up to 42% of the time.    Based on prev h/o response to inhaler she appears to have an asthmatic component but I'm surprised it wasn't eliminated with pred rx so makes me suspicious of uacs and bronchiectasis and can't r/o component of cardiac asthma either as bnp was in intermediate range and she does have diastolic GI dysyfunction by echo   rec  Trial of dulera 100 2bid - The proper method of use, as well as anticipated side effects, of a metered-dose inhaler are discussed and demonstrated to the patient. Improved effectiveness after extensive coaching during this visit to a level of approximately  75%   -Continue max rx for gerd including diet   - consider CT chest and sinus next   I had an extended discussion with the patient reviewing all relevant studies completed to date and  lasting 73 m Each maintenance medication was reviewed in detail including most  importantly the difference between maintenance and as needed and under what circumstances the prns are to be used.  Please see instructions for details which were reviewed in writing and the patient given a copy.

## 2015-01-19 NOTE — Progress Notes (Signed)
Agree with [name] [name]'s assessment and plan. [name] [name] E. Arthella Headings, MD, FACG   

## 2015-01-31 DIAGNOSIS — M4806 Spinal stenosis, lumbar region: Secondary | ICD-10-CM | POA: Diagnosis not present

## 2015-01-31 DIAGNOSIS — M5416 Radiculopathy, lumbar region: Secondary | ICD-10-CM | POA: Diagnosis not present

## 2015-01-31 DIAGNOSIS — M545 Low back pain: Secondary | ICD-10-CM | POA: Diagnosis not present

## 2015-02-01 ENCOUNTER — Other Ambulatory Visit: Payer: Self-pay | Admitting: Geriatric Medicine

## 2015-02-01 ENCOUNTER — Telehealth: Payer: Self-pay | Admitting: Internal Medicine

## 2015-02-01 MED ORDER — HYDROCHLOROTHIAZIDE 25 MG PO TABS: 25.0000 mg | ORAL_TABLET | Freq: Every day | ORAL | Status: DC

## 2015-02-01 MED ORDER — BYSTOLIC 10 MG PO TABS: 10.0000 mg | ORAL_TABLET | Freq: Every day | ORAL | Status: DC

## 2015-02-01 NOTE — Telephone Encounter (Signed)
Sent to pharmacy 

## 2015-02-01 NOTE — Telephone Encounter (Signed)
Pt called in and needs refill on her   Hydrochlorothiazide and Bystolic   [facility] [address]

## 2015-02-02 ENCOUNTER — Other Ambulatory Visit: Payer: Self-pay | Admitting: Internal Medicine

## 2015-02-10 ENCOUNTER — Ambulatory Visit (INDEPENDENT_AMBULATORY_CARE_PROVIDER_SITE_OTHER): Payer: Medicare Other | Admitting: Internal Medicine

## 2015-02-10 ENCOUNTER — Encounter: Payer: Self-pay | Admitting: Internal Medicine

## 2015-02-10 VITALS — BP 124/70 | HR 72 | Ht 66.0 in | Wt 185.1 lb

## 2015-02-10 DIAGNOSIS — Z7952 Long term (current) use of systemic steroids: Secondary | ICD-10-CM

## 2015-02-10 DIAGNOSIS — J45991 Cough variant asthma: Secondary | ICD-10-CM | POA: Diagnosis not present

## 2015-02-10 DIAGNOSIS — K5010 Crohn's disease of large intestine without complications: Secondary | ICD-10-CM

## 2015-02-10 DIAGNOSIS — M8580 Other specified disorders of bone density and structure, unspecified site: Secondary | ICD-10-CM | POA: Diagnosis not present

## 2015-02-10 MED ORDER — PREDNISONE 5 MG PO TABS: 7.5000 mg | ORAL_TABLET | Freq: Every day | ORAL | Status: DC

## 2015-02-10 NOTE — Patient Instructions (Signed)
If not doing better call Libby at [phone] sinus ct and high resolution chest ct   Please schedule a follow up office visit in 6 weeks, call sooner if needed with pfts

## 2015-02-10 NOTE — Patient Instructions (Addendum)
  Today we are giving you a printed rx for Prednisone 107m to take to the pharmacy when you need it.  In September try to go to 7.577mof prednisone daily.    I appreciate the opportunity to care for you. CaSilvano RuskMD, FAComplex Care Hospital At [facility]

## 2015-02-10 NOTE — Assessment & Plan Note (Signed)
F/U DEXA late 2016/early 17 if possib;e Continue Ca + vit D

## 2015-02-10 NOTE — Assessment & Plan Note (Signed)
Better Try to reduce to 7.5 mg prednisone 9/1 See me in Jan 2017 routine - sooner prn

## 2015-02-10 NOTE — Progress Notes (Signed)
   Subjective:    Patient ID: [place], female    DOB: [birth year], 74 y.o.   MRN: [id number] Cc: Crohn's disease HPI Albina is here reporting she's doing well without any bleeding or diarrhea. All the presumed side effects from the [place] are gone. She is pleased with the help Dr. Melvyn Novas is providing. She is feeling better overall. Medications, allergies, past medical history, past surgical history, family history and social history are reviewed and updated in the EMR.   Review of Systems As above, she is having less pedal edema    Objective:   Physical Exam @BP  124/70 mmHg  Pulse 72  Ht 5' 6"  (1.676 m)  Wt 185 lb 2 oz (83.972 kg)  BMI 29.89 kg/m2@  General:  NAD Eyes:   anicteric Lungs:  clear with some coarse breath sounds Heart:: S1S2 no rubs, murmurs or gallops Abdomen:  soft and nontender, BS+ Ext:   no edema, cyanosis or clubbing    Data Reviewed:  Pulmonary notes from June     Assessment & Plan:  Crohn's colitis - left -sided Better Try to reduce to 7.5 mg prednisone 9/1 See me in Jan 2017 routine - sooner prn  Osteopenia F/U DEXA late 2016/early 17 if possib;e Continue Ca + vit D  Long term current use of systemic steroids - prednisone Try to reduce dose but she is intolerant of or other IBD meds ineffective or too costly  I appreciate the opportunity to care for this patient.

## 2015-02-10 NOTE — Progress Notes (Signed)
Subjective:    Patient ID: [place], female    DOB: 1940/08/24    MRN: [id number]    Brief patient profile:  13 yowf with steroid dep UC quit light smoking in 1970 with sinus problems dating back to 1960s and recurrent bronchitis mostly spring and fall with allergy testing neg in 2014 and more consistent sob year round since 2014  >  Inhalers per Heide Scales  helped a lot and referred  By Dr Carlean Purl to pulmonary clinic 01/13/2015    01/13/2015 1st  Pulmonary office visit/ Shirley Bolle   Chief Complaint  Patient presents with  . Pulmonary Consult    Referred by Dr. [name].  Pt c/o SOB for the past 2-3 yrs. She gets SOB walking from room to room at home and sometimes just at rest. She also c/o cough- prod with light green sputum.    last eval by Constance Holster neg x 3 m prior to OV, dx as gerd/  Does produce some green mucus variable but worse at hs - can't lie flat s cough x months On prednisone daily x years  Coughs so hard she gags but no vomoit. Sob at rest usually related to coughing fits Attributes recent problems to rx for uc and much better since it was stopped but note cough and sob continue and only thing that helped was "purple inhaler" per Golden Hurter.  rec dulera 100 Take 2 puffs first thing in am and then another 2 puffs about 12 hours later - fill the prescription if you like it   Work on inhaler technique:  Add pepcid ac 20 mg at bedtime GERD diet   ? Needs hrct for bronchiectasis and do sinus ct too     02/10/2015 f/u ov/Ladonya Jerkins re: ? Cough variant asthma  Chief Complaint  Patient presents with  . Follow-up    NO Compliants, Feeling better since stopped Chron's treatment. Feeling like a human again feeling good.    No obvious day to day or daytime variability or assoc chronic cough or cp or chest tightness, subjective wheeze or overt sinus or hb symptoms. No unusual exp hx or h/o childhood pna/ asthma or knowledge of premature birth.  Sleeping ok without nocturnal  or  early am exacerbation  of respiratory  c/o's or need for noct saba. Also denies any obvious fluctuation of symptoms with weather or environmental changes or other aggravating or alleviating factors except as outlined above   Current Medications, Allergies, Complete Past Medical History, Past Surgical History, Family History, and Social History were reviewed in [employer] record.  ROS  The following are not active complaints unless bolded sore throat, dysphagia, dental problems, itching, sneezing,  nasal congestion or excess/ purulent secretions, ear ache,   fever, chills, sweats, unintended wt loss, classically pleuritic or exertional cp, hemoptysis,  orthopnea pnd or leg swelling, presyncope, palpitations, abdominal pain, anorexia, nausea, vomiting, diarrhea  or change in bowel or bladder habits, change in stools or urine, dysuria,hematuria,  rash, arthralgias, visual complaints, headache, numbness, weakness or ataxia or problems with walking or coordination,  change in mood/affect or memory.                 Objective:   Physical Exam  amb wf nad/ congested sounding cough   02/10/2015      183  Wt Readings from Last 3 Encounters:  01/13/15 183 lb 12.8 oz (83.371 kg)  01/03/15 184 lb 4 oz (83.575 kg)  12/31/14 188  lb (85.276 kg)    Vital signs reviewed   HEENT: nl dentition, turbinates, and orophanx. Nl external ear canals without cough reflex   NECK :  without JVD/Nodes/TM/ nl carotid upstrokes bilaterally   LUNGS: no acc muscle use,  Still  insp and exp rhonchi bilaterally    CV:  RRR  no s3 or murmur or increase in P2, no edema   ABD:  soft and nontender with nl excursion in the supine position. No bruits or organomegaly, bowel sounds nl  MS:  warm without deformities, calf tenderness, cyanosis or clubbing  SKIN: warm and dry without lesions    NEURO:  alert, approp, no deficits    I personally reviewed images and agree with radiology impression  as follows:  CXR: 01/03/15 No active cardiopulmonary disease.  Labs ordered/ reviewed:     Chemistry      Component Value Date/Time   NA 133* 01/03/2015 1543   K 3.8 01/03/2015 1543   CL 98 01/03/2015 1543   CO2 28 01/03/2015 1543   BUN 18 01/03/2015 1543   CREATININE 1.11 01/03/2015 1543      Component Value Date/Time   CALCIUM 9.2 01/03/2015 1543   ALKPHOS 48 01/03/2015 1543   AST 17 01/03/2015 1543   ALT 21 01/03/2015 1543   BILITOT 0.3 01/03/2015 1543       Lab Results  Component Value Date   WBC 14.4* 01/03/2015   HGB 14.5 01/03/2015   HCT 44.3 01/03/2015   MCV 94.4 01/03/2015   PLT 433.0* 01/03/2015       Lab Results  Component Value Date   TSH 0.65 01/03/2015     Lab Results  Component Value Date   PROBNP 188.0* 01/03/2015     Lab Results  Component Value Date   ESRSEDRATE 8 01/03/2015            Assessment & Plan:

## 2015-02-10 NOTE — Assessment & Plan Note (Signed)
Try to reduce dose but she is intolerant of or other IBD meds ineffective or too costly

## 2015-02-13 ENCOUNTER — Encounter: Payer: Self-pay | Admitting: Internal Medicine

## 2015-02-13 NOTE — Assessment & Plan Note (Addendum)
-   01/13/2015 p extensive coaching HFA effectiveness = 75% >>    trial of dulera 100 2bid  > improved  The proper method of use, as well as anticipated side effects, of a metered-dose inhaler are discussed and demonstrated to the patient. Improved effectiveness after extensive coaching during this visit to a level of approximately  Still 75%   At this point she feels better but still has a noisy chest and I am concerned about underlying bronchiectasis related to Crohn's but no need for any change in rx at this point pending pfts  If gets worse before next ov asked her to call for scans in interim  I had an extended discussion with the patient reviewing all relevant studies completed to date and  lasting 15 to 20 minutes of a 25 minute visit    Each maintenance medication was reviewed in detail including most importantly the difference between maintenance and prns and under what circumstances the prns are to be triggered using an action plan format that is not reflected in the computer generated alphabetically organized AVS.    Please see instructions for details which were reviewed in writing and the patient given a copy highlighting the part that I personally wrote and discussed at today's ov.

## 2015-02-15 ENCOUNTER — Telehealth: Payer: Self-pay | Admitting: Internal Medicine

## 2015-02-15 NOTE — Telephone Encounter (Signed)
PA for [facility] 100-65mg approved by [employer] Approved through 02/15/2016. Ref # [id number] HNira Connat CSun Valleynotified.  [facility] states even with approval the patient cost is $138.02. Please advise.

## 2015-02-15 NOTE — Telephone Encounter (Signed)
Dulera 100 sample placed up front to pick up  Pt aware to contact her insurance company for drug formulary  Pt will call us to schedule appt as soon as she gets the formulary.  Nothing further needed.

## 2015-02-15 NOTE — Telephone Encounter (Signed)
Received PA form for [facility]. Submitted through cover my meds. Key: [id number] Will await response from insurance.

## 2015-02-15 NOTE — Telephone Encounter (Signed)
Ok to give a 2 week sample then ov with formulary in hand to see me or Tammy NP to sort out best option

## 2015-02-15 NOTE — Telephone Encounter (Signed)
Spoke with pt, states her [name] [name] needs a PA.   [facility] [place].   Spoke with pharmacist, having PA initiation form faxed to office.  Will await fax.

## 2015-02-16 ENCOUNTER — Telehealth: Payer: Self-pay | Admitting: Internal Medicine

## 2015-02-16 NOTE — Telephone Encounter (Signed)
Advised pt that I would get her formulary to MW. It will be placed in the drawer up front under the '2' tab since she has an appointment on 03/25/15.

## 2015-02-23 DIAGNOSIS — M5416 Radiculopathy, lumbar region: Secondary | ICD-10-CM | POA: Diagnosis not present

## 2015-02-23 DIAGNOSIS — M4806 Spinal stenosis, lumbar region: Secondary | ICD-10-CM | POA: Diagnosis not present

## 2015-02-25 ENCOUNTER — Ambulatory Visit (HOSPITAL_COMMUNITY): Payer: Medicare Other

## 2015-03-14 ENCOUNTER — Telehealth: Payer: Self-pay | Admitting: Internal Medicine

## 2015-03-14 NOTE — Telephone Encounter (Signed)
Patient notified She has had 2 injection in her back and hip lately She will call next week

## 2015-03-14 NOTE — Telephone Encounter (Signed)
No follow up scheduled.  Please advise

## 2015-03-14 NOTE — Telephone Encounter (Signed)
We can work over phone with a f/u call back from her in 1 week after 20 mg daily I want her to have her back and hip pain evaluated by her PCP - long term prednisone can lead to problems in these areas and if she has been having pains needs to be seen  She should not use Aleve, ibuprofen, Advil, etc  Best NSAID for her to reduce chance of UC flare would be celecoxcib (Celebrex)

## 2015-03-14 NOTE — Telephone Encounter (Signed)
Aleve can trigger a flare so needs to stop that  Go up to 20 mg prednisone daily  When is she seeing me next please?

## 2015-03-14 NOTE — Telephone Encounter (Signed)
Patient reports since Friday she has been seeing blood in her stool.  She has had pain in her back and hip and is taking Aleve 2 BID for a few weeks.  She asks if this could cause this?  She also asks if she should increase her prednisone currently on 10 mg? Please advise.

## 2015-03-23 ENCOUNTER — Telehealth: Payer: Self-pay | Admitting: Internal Medicine

## 2015-03-23 NOTE — Telephone Encounter (Signed)
Patient reports improvement in her symptoms.  She is having a scant amount of rectal bleeding.  She is tired "but other than that I feel human and much better".  She is currently on 20 mg of prednisone and aksed to call with an update of her symptoms.  Please advise prednisone taper if any

## 2015-03-23 NOTE — Telephone Encounter (Signed)
Patient notified  She will call back for any additional questions or concerns

## 2015-03-23 NOTE — Telephone Encounter (Signed)
15 mg daiy x 2 weeks then back to 10 mg daily and stay

## 2015-03-25 ENCOUNTER — Ambulatory Visit (INDEPENDENT_AMBULATORY_CARE_PROVIDER_SITE_OTHER): Payer: Medicare Other | Admitting: Internal Medicine

## 2015-03-25 ENCOUNTER — Encounter: Payer: Self-pay | Admitting: Internal Medicine

## 2015-03-25 VITALS — BP 112/70 | HR 97 | Ht 66.0 in | Wt 183.0 lb

## 2015-03-25 DIAGNOSIS — Z23 Encounter for immunization: Secondary | ICD-10-CM

## 2015-03-25 DIAGNOSIS — J45991 Cough variant asthma: Secondary | ICD-10-CM

## 2015-03-25 LAB — PULMONARY FUNCTION TEST
DL/VA % pred: 72 %
DL/VA: 3.67 ml/min/mmHg/L
DLCO UNC % PRED: 61 %
DLCO unc: 16.67 ml/min/mmHg
FEF 25-75 POST: 3.11 L/s
FEF 25-75 Pre: 2.36 L/sec
FEF2575-%Change-Post: 31 %
FEF2575-%PRED-POST: 167 %
FEF2575-%PRED-PRE: 127 %
FEV1-%CHANGE-POST: 3 %
FEV1-%Pred-Post: 96 %
FEV1-%Pred-Pre: 93 %
FEV1-Post: 2.28 L
FEV1-Pre: 2.21 L
FEV1FVC-%Change-Post: 2 %
FEV1FVC-%PRED-PRE: 108 %
FEV6-%Change-Post: 0 %
FEV6-%Pred-Post: 90 %
FEV6-%Pred-Pre: 90 %
FEV6-POST: 2.71 L
FEV6-Pre: 2.69 L
FEV6FVC-%CHANGE-POST: 0 %
FEV6FVC-%Pred-Post: 104 %
FEV6FVC-%Pred-Pre: 103 %
FVC-%Change-Post: 0 %
FVC-%PRED-POST: 86 %
FVC-%Pred-Pre: 86 %
FVC-PRE: 2.7 L
FVC-Post: 2.71 L
POST FEV1/FVC RATIO: 84 %
PRE FEV1/FVC RATIO: 82 %
Post FEV6/FVC ratio: 100 %
Pre FEV6/FVC Ratio: 99 %
RV % pred: 67 %
RV: 1.58 L
TLC % PRED: 82 %
TLC: 4.41 L

## 2015-03-25 NOTE — Assessment & Plan Note (Signed)
-   01/13/2015     trial of dulera 100 2bid  > improved 02/10/15  - PFT's  03/25/2015  FEV1 2.28 (96 % ) ratio 84  p no % improvement from saba with DLCO  61 % corrects to 72 % for alv volume on pred 20 and no dulera x one week   - 03/25/2015 p extensive coaching HFA effectiveness =    90%   I still strongly suspect bronchiectasis here related to inflammatory bowel dz but presently can't detect it off dulera while on prednisone   I had an extended final summary discussion with the patient reviewing all relevant studies completed to date and  lasting 15 to 20 minutes of a 25 minute visit on the following issues:    1) should keep dulera 100 on hand if symptoms flare > if controls them then just stay on it - if not, next step is sinus CT and HRCT Chest   2) Each maintenance medication was reviewed in detail including most importantly the difference between maintenance and as needed and under what circumstances the prns are to be used.  Please see instructions for details which were reviewed in writing and the patient given a copy.

## 2015-03-25 NOTE — Progress Notes (Signed)
PFT done today. 

## 2015-03-25 NOTE — Patient Instructions (Signed)
If cough / shortness of breath recur first start dulera 100 Take 2 puffs first thing in am and then another 2 puffs about 12 hours later   Then If not doing better call [facility] at [phone] sinus ct and high resolution chest ct   If better we may need to change you over to the equivalent to dulera = symbicort 80 per your insurance restrictions   If you are satisfied with your treatment plan,  let your doctor know and he/she can either refill your medications or you can return here when your prescription runs out.     If in any way you are not 100% satisfied,  please tell us.  If 100% better, tell your friends!  Pulmonary follow up is as needed

## 2015-03-25 NOTE — Progress Notes (Signed)
Subjective:    Patient ID: [name] [name], female    DOB: 07-17-1941    MRN: [id number]    Brief patient profile:  27 yowf with steroid dep UC quit light smoking in 1970 with sinus problems dating back to 1960s and recurrent bronchitis mostly spring and fall with allergy testing neg in 2014 and more consistent sob year round since 2014  >  Inhalers per Heide Scales  helped a lot and referred  By Dr Carlean Purl to pulmonary clinic 01/13/2015     History of Present Illness  01/13/2015 1st [facility] office visit/ [name] [name]   Chief Complaint  Patient presents with  . Pulmonary Consult    Referred by Dr. [name].  Pt c/o SOB for the past 2-3 yrs. She gets SOB walking from room to room at home and sometimes just at rest. She also c/o cough- prod with light green sputum.    last eval by Constance Holster neg x 3 m prior to OV, dx as gerd/  Does produce some green mucus variable but worse at hs - can't lie flat s cough x months On prednisone daily x years  Coughs so hard she gags but no vomoit. Sob at rest usually related to coughing fits Attributes recent problems to rx for uc and much better since it was stopped but note cough and sob continue and only thing that helped was "purple inhaler" per Golden Hurter.  rec dulera 100 Take 2 puffs first thing in am and then another 2 puffs about 12 hours later - fill the prescription if you like it   Work on inhaler technique:  Add pepcid ac 20 mg at bedtime GERD diet   ? Needs hrct for bronchiectasis and do sinus ct too     02/10/2015 f/u ov/[name] [name] re: ? Cough variant asthma  Chief Complaint  Patient presents with  . Follow-up    NO Compliants, Feeling better since stopped Chron's treatment. Feeling like a human again feeling good.  rec Continue dulera 100 2bid       03/25/2015 f/u ov/[name] [name] re: ? bronhchiectasis asso with chron's  Chief Complaint  Patient presents with  . Follow-up    PFT done today. Pt states her breathing is doing well. No new co's  today. Needs alternative for [facility]- ran out x 1 wk ago.    no worse off dulera so far but on pred 20 for bronchiectasis with nl airflow  Not limited by breathing from desired activities  And not using saba   No obvious day to day or daytime variability or assoc or cp or chest tightness, subjective wheeze or overt sinus or hb symptoms. No unusual exp hx or h/o childhood pna/ asthma or knowledge of premature birth.  Sleeping ok without nocturnal  or early am exacerbation  of respiratory  c/o's or need for noct saba. Also denies any obvious fluctuation of symptoms with weather or environmental changes or other aggravating or alleviating factors except as outlined above   Current Medications, Allergies, Complete Past Medical History, Past Surgical History, Family History, and Social History were reviewed in [employer] record.  ROS  The following are not active complaints unless bolded sore throat, dysphagia, dental problems, itching, sneezing,  nasal congestion or excess/ purulent secretions, ear ache,   fever, chills, sweats, unintended wt loss, classically pleuritic or exertional cp, hemoptysis,  orthopnea pnd or leg swelling, presyncope, palpitations, abdominal pain, anorexia, nausea, vomiting, diarrhea  or change in bowel or bladder  habits, change in stools or urine, dysuria,hematuria,  rash, arthralgias, visual complaints, headache, numbness, weakness or ataxia or problems with walking or coordination,  change in mood/affect or memory.                 Objective:   Physical Exam  amb wf nad   02/10/2015      183  Wt Readings from Last 3 Encounters:  01/13/15 183 lb 12.8 oz (83.371 kg)  01/03/15 184 lb 4 oz (83.575 kg)  12/31/14 188 lb (85.276 kg)    Vital signs reviewed   HEENT: top denture/ bottom partial / o/w nl dentition, turbinates, and orophanx. Nl external ear canals without cough reflex   NECK :  without JVD/Nodes/TM/ nl carotid upstrokes  bilaterally   LUNGS: no acc muscle use,  Somewhat coarse insp and exp rhonchi bilaterally    CV:  RRR  no s3 or murmur or increase in P2, no edema   ABD:  soft and nontender with nl excursion in the supine position. No bruits or organomegaly, bowel sounds nl  MS:  warm without deformities, calf tenderness, cyanosis or clubbing  SKIN: warm and dry without lesions    NEURO:  alert, approp, no deficits    I personally reviewed images and agree with radiology impression as follows:  CXR: 01/03/15 No active cardiopulmonary disease.  Labs ordered/ reviewed:     Chemistry      Component Value Date/Time   NA 133* 01/03/2015 1543   K 3.8 01/03/2015 1543   CL 98 01/03/2015 1543   CO2 28 01/03/2015 1543   BUN 18 01/03/2015 1543   CREATININE 1.11 01/03/2015 1543      Component Value Date/Time   CALCIUM 9.2 01/03/2015 1543   ALKPHOS 48 01/03/2015 1543   AST 17 01/03/2015 1543   ALT 21 01/03/2015 1543   BILITOT 0.3 01/03/2015 1543       Lab Results  Component Value Date   WBC 14.4* 01/03/2015   HGB 14.5 01/03/2015   HCT 44.3 01/03/2015   MCV 94.4 01/03/2015   PLT 433.0* 01/03/2015       Lab Results  Component Value Date   TSH 0.65 01/03/2015     Lab Results  Component Value Date   PROBNP 188.0* 01/03/2015     Lab Results  Component Value Date   ESRSEDRATE 8 01/03/2015            Assessment & Plan:

## 2015-04-01 ENCOUNTER — Encounter: Payer: Self-pay | Admitting: Internal Medicine

## 2015-04-01 ENCOUNTER — Ambulatory Visit (INDEPENDENT_AMBULATORY_CARE_PROVIDER_SITE_OTHER): Payer: Medicare Other | Admitting: Internal Medicine

## 2015-04-01 ENCOUNTER — Telehealth: Payer: Self-pay | Admitting: *Deleted

## 2015-04-01 ENCOUNTER — Other Ambulatory Visit (INDEPENDENT_AMBULATORY_CARE_PROVIDER_SITE_OTHER): Payer: Medicare Other

## 2015-04-01 ENCOUNTER — Other Ambulatory Visit: Payer: Self-pay | Admitting: Gastroenterology

## 2015-04-01 VITALS — BP 128/76 | HR 62 | Temp 98.4°F | Resp 16 | Ht 66.0 in | Wt 185.0 lb

## 2015-04-01 DIAGNOSIS — R739 Hyperglycemia, unspecified: Secondary | ICD-10-CM

## 2015-04-01 DIAGNOSIS — K5010 Crohn's disease of large intestine without complications: Secondary | ICD-10-CM | POA: Diagnosis not present

## 2015-04-01 DIAGNOSIS — K50911 Crohn's disease, unspecified, with rectal bleeding: Secondary | ICD-10-CM

## 2015-04-01 DIAGNOSIS — R7301 Impaired fasting glucose: Secondary | ICD-10-CM

## 2015-04-01 LAB — COMPREHENSIVE METABOLIC PANEL
ALT: 17 U/L (ref 0–35)
AST: 16 U/L (ref 0–37)
Albumin: 3.8 g/dL (ref 3.5–5.2)
Alkaline Phosphatase: 62 U/L (ref 39–117)
BILIRUBIN TOTAL: 0.6 mg/dL (ref 0.2–1.2)
BUN: 13 mg/dL (ref 6–23)
CO2: 24 meq/L (ref 19–32)
Calcium: 9.3 mg/dL (ref 8.4–10.5)
Chloride: 96 mEq/L (ref 96–112)
Creatinine, Ser: 1.22 mg/dL — ABNORMAL HIGH (ref 0.40–1.20)
GFR: 45.86 mL/min — AB (ref >60.00)
GLUCOSE: 146 mg/dL — AB (ref 70–99)
Potassium: 3.3 mEq/L — ABNORMAL LOW (ref 3.5–5.1)
Sodium: 133 mEq/L — ABNORMAL LOW (ref 135–145)
Total Protein: 7.3 g/dL (ref 6.0–8.3)

## 2015-04-01 LAB — CBC
HCT: 44.6 % (ref 36.0–46.0)
Hemoglobin: 15.3 g/dL — ABNORMAL HIGH (ref 12.0–15.0)
MCHC: 34.2 g/dL (ref 30.0–36.0)
MCV: 91.8 fl (ref 78.0–100.0)
PLATELETS: 503 10*3/uL — AB (ref 150.0–400.0)
RBC: 4.86 Mil/uL (ref 3.87–5.11)
RDW: 14.7 % (ref 11.5–15.5)
WBC: 23.6 10*3/uL (ref 4.0–10.5)

## 2015-04-01 LAB — HEMOGLOBIN A1C: HEMOGLOBIN A1C: 6.2 % (ref 4.6–6.5)

## 2015-04-01 MED ORDER — ONDANSETRON HCL 4 MG PO TABS
4.0000 mg | ORAL_TABLET | Freq: Three times a day (TID) | ORAL | Status: DC | PRN
Start: 2015-04-01 — End: 2018-12-16

## 2015-04-01 MED ORDER — TRAMADOL HCL 50 MG PO TABS: 50.0000 mg | ORAL_TABLET | Freq: Three times a day (TID) | ORAL | Status: DC | PRN

## 2015-04-01 NOTE — Assessment & Plan Note (Addendum)
Since decreasing prednisone to 15 mg daily got symptoms of bleeding, pain, diarrhea, nausea. Refill of her zofran and advise to resume taking 20 mg daily. If no improvement in 1-2 days go up to 25 mg daily. Will forward to her GI doctor so he is aware. She was advised to call their office as well if no improvement. Using lomotil and bentyl for cramping and pain without much success. Checking CBC and CMP for signs of dehydration.

## 2015-04-01 NOTE — Assessment & Plan Note (Signed)
Checking HgA1c today and not currently on medication. This is due to chronic steroid usage.

## 2015-04-01 NOTE — Telephone Encounter (Signed)
Rec call from [facility] in [place] lab. Pt's WBC count is critical at 23.6. PCP made aware and advises no changes. Pt was already given instructions at [place] today.

## 2015-04-01 NOTE — Progress Notes (Signed)
Pre visit review using our clinic review tool, if applicable. No additional management support is needed unless otherwise documented below in the visit note. 

## 2015-04-01 NOTE — Patient Instructions (Addendum)
We will check your labs today to make sure that you are not getting dehydrated.   Increase the prednisone back to 20 mg daily, if you are still having the bloody stools increase to 25 mg after 2 days of the 20 mg. We will send the note to Dr. Carlean Purl so that he knows what is happening with your Crohn's.   We have given you tramadol for the arthritis pain. This is a medicine that helps with arthritis. The most common side effects can be drowsiness and constipation. Do not drive after taking it until you know how it will affect you. You can take 1 pill up to twice a day for the pain.

## 2015-04-01 NOTE — Progress Notes (Signed)
   Subjective:    Patient ID: [name] [name], female    DOB: 1940/09/07, 74 y.o.   MRN: [id number]  HPI The patient is a 74 YO female coming in for rectal bleeding and diarrhea. She does have crohn's disease and had decreased her prednisone from 20 mg daily to 15 mg daily on 03/24/15 and thought it was going well. About 3-4 days later started having pain, diarrhea with blood after meals. Has had several episodes in the middle of the night with the diarrhea. Nothing is helping. She is trying the bentyl and the lomotil. Denies taking ibuprofen or alleve OTC as she knows they bother her stomach. Has not called her GI doctor yet. Has not been able to tolerate any other GI medicines.   Review of Systems  Constitutional: Positive for activity change, appetite change and fatigue. Negative for fever, chills and unexpected weight change.  HENT: Negative.   Eyes: Negative.   Respiratory: Negative for cough, chest tightness, shortness of breath and wheezing.   Cardiovascular: Negative for chest pain, palpitations and leg swelling.  Gastrointestinal: Positive for nausea, constipation and blood in stool. Negative for abdominal pain, diarrhea and abdominal distention.  Musculoskeletal: Positive for arthralgias. Negative for myalgias and back pain.  Skin: Negative.   Neurological: Negative.   Psychiatric/Behavioral: Negative.       Objective:   Physical Exam  Constitutional: She is oriented to person, place, and time. She appears well-developed and well-nourished.  HENT:  Head: Normocephalic and atraumatic.  Eyes: EOM are normal.  Neck: Normal range of motion.  Cardiovascular: Normal rate and regular rhythm.   Pulmonary/Chest: Effort normal. No respiratory distress. She has no wheezes. She has no rales.  Abdominal: Soft. Bowel sounds are normal. She exhibits no distension. There is tenderness. There is no rebound.  Tenderness in the lower abdomen but no rebound or guarding.   Musculoskeletal: She  exhibits no edema.  Neurological: She is alert and oriented to person, place, and time. Coordination normal.  Skin: Skin is warm and dry.   Filed Vitals:   04/01/15 0953  BP: 128/76  Pulse: 62  Temp: 98.4 F (36.9 C)  TempSrc: Oral  Resp: 16  Height: 5' 6"  (1.676 m)  Weight: 185 lb (83.915 kg)  SpO2: 98%      Assessment & Plan:

## 2015-04-19 ENCOUNTER — Other Ambulatory Visit: Payer: Self-pay | Admitting: Internal Medicine

## 2015-04-23 NOTE — Progress Notes (Signed)
Please get symptom and prednisone dose update.

## 2015-04-25 ENCOUNTER — Telehealth: Payer: Self-pay

## 2015-04-27 NOTE — Telephone Encounter (Signed)
Left message for patient to call back  

## 2015-05-02 NOTE — Telephone Encounter (Signed)
Patient report that she is on 10 mg of prednisone.  Still having some cramping.  Diarrhea has resolved.  "I feel better than I did last week".  She as an appt with Dr., Ninfa Linden about her hip this week.

## 2015-05-03 NOTE — Telephone Encounter (Signed)
Patient notified

## 2015-05-03 NOTE — Telephone Encounter (Signed)
Ok  Stay at 10 mg daily  Let us know if things worsen  See me by Jan

## 2015-05-04 DIAGNOSIS — M7062 Trochanteric bursitis, left hip: Secondary | ICD-10-CM | POA: Diagnosis not present

## 2015-05-16 ENCOUNTER — Other Ambulatory Visit: Payer: Self-pay | Admitting: Internal Medicine

## 2015-05-18 ENCOUNTER — Telehealth: Payer: Self-pay | Admitting: Internal Medicine

## 2015-05-18 NOTE — Telephone Encounter (Signed)
Patient is scheduled for 05/26/15 for flex and will come tomorrow for a pre-visit/

## 2015-05-18 NOTE — Telephone Encounter (Signed)
Please see if we can do a flex on her in the [place] on 11/2 - add on to the end It looks like that would be ok based upon what I have scheduled

## 2015-05-18 NOTE — Telephone Encounter (Signed)
Patient reports diarrhea, cramps, rectal bleeding, weakness.  This started on Saturday.  4 diarrhea episodes today.  She is currently on 20 mg of prednisone (she increased that on Sunday on her own).

## 2015-05-19 ENCOUNTER — Ambulatory Visit (AMBULATORY_SURGERY_CENTER): Payer: Self-pay | Admitting: *Deleted

## 2015-05-19 VITALS — Ht 66.0 in | Wt 179.0 lb

## 2015-05-19 DIAGNOSIS — K50119 Crohn's disease of large intestine with unspecified complications: Secondary | ICD-10-CM

## 2015-05-19 NOTE — Progress Notes (Signed)
No egg or soy allergy No issues with past sedation but is slow to wake up post op  No diet pills No home 02 use

## 2015-05-26 ENCOUNTER — Encounter: Payer: Self-pay | Admitting: Internal Medicine

## 2015-05-26 ENCOUNTER — Ambulatory Visit (AMBULATORY_SURGERY_CENTER): Payer: Medicare Other | Admitting: Internal Medicine

## 2015-05-26 VITALS — BP 149/64 | HR 55 | Temp 95.4°F | Resp 17 | Ht 66.0 in | Wt 179.0 lb

## 2015-05-26 DIAGNOSIS — I10 Essential (primary) hypertension: Secondary | ICD-10-CM | POA: Diagnosis not present

## 2015-05-26 DIAGNOSIS — K50119 Crohn's disease of large intestine with unspecified complications: Secondary | ICD-10-CM

## 2015-05-26 DIAGNOSIS — D124 Benign neoplasm of descending colon: Secondary | ICD-10-CM | POA: Diagnosis not present

## 2015-05-26 DIAGNOSIS — D125 Benign neoplasm of sigmoid colon: Secondary | ICD-10-CM | POA: Diagnosis not present

## 2015-05-26 DIAGNOSIS — K529 Noninfective gastroenteritis and colitis, unspecified: Secondary | ICD-10-CM | POA: Diagnosis not present

## 2015-05-26 DIAGNOSIS — K5090 Crohn's disease, unspecified, without complications: Secondary | ICD-10-CM | POA: Diagnosis not present

## 2015-05-26 MED ORDER — SODIUM CHLORIDE 0.9 % IV SOLN
500.0000 mL | INTRAVENOUS | Status: DC
Start: 2015-05-26 — End: 2015-05-29

## 2015-05-26 MED ORDER — PREDNISONE 10 MG PO TABS: 40.0000 mg | ORAL_TABLET | Freq: Every day | ORAL | Status: DC

## 2015-05-26 NOTE — Op Note (Signed)
[facility]  [address] [place], [address]   FLEXIBLE SIGMOIDOSCOPY PROCEDURE REPORT  [name]: RegisterCassanda, Walmer  MR#: [id number] BIRTHDATE: 03-23-1941 , 57  yrs. old GENDER: female ENDOSCOPIST: [name], MD, [facility] PROCEDURE DATE:  05/26/2015 PROCEDURE:   Sigmoidoscopy with biopsy ASA CLASS:   Class II INDICATIONS:f/u Crohn's - having diarrhea. MEDICATIONS: Propofol 120 mg IV and Monitored anesthesia care  DESCRIPTION OF PROCEDURE:   After [name] risks benefits and alternatives of [name] procedure were thoroughly explained, informed consent was obtained.  Digital exam revealed no abnormalities of [name] rectum. [name] LB [id number]  endoscope was introduced through [name] anus  and advanced to [name] transverse colon , [name] exam was Without limitations.    [name] quality of [name] prep was [name] overall prep quality was adequate. . Estimated blood loss is zero unless otherwise noted in this procedure report. [name] instrument was then slowly withdrawn as [name] mucosa was fully examined.         COLON FINDINGS: Patchy colitis descending and transverse- mild granular changes, erythema, a few aphthae - biopsies taken.   A few inflammatory polyps - biopsied.   There was diverticulosis noted in [name] sigmoid colon.    Retroflexed views revealed no abnormalities. [name] scope was then withdrawn from [name] [name] and [name] procedure terminated.  COMPLICATIONS: There were no immediate complications.  ENDOSCOPIC IMPRESSION: 1.   Patchy colitis descending and transverse- mild granular changes, erythema, a few aphthae - biopsies taken 2.   A few inflammatory polyps - biopsied 3.   Diverticulosis was noted in [name] sigmoid colon  RECOMMENDATIONS: 1.  Await biopsy results 2.  Increase prednisone to 40 mg daily 3. ? if there is a biologic she could tolerate or afford    eSigned:  [name], MD, [facility] 05/26/2015 5:11 PM   CC: [name] [name]

## 2015-05-26 NOTE — Patient Instructions (Addendum)
The exam showed active Crohn's inflammation - I took some biopsies to be sure.  Take prednisone 4 tablets (40 mg) daily until you hear from me next week.  I appreciate the opportunity to care for you. [name], MD, FACG YOU HAD AN ENDOSCOPIC PROCEDURE TODAY AT [facility]:   Refer to the procedure report that was given to you for any specific questions about what was found during the examination.  If the procedure report does not answer your questions, please call your gastroenterologist to clarify.  If you requested that your care partner not be given the details of your procedure findings, then the procedure report has been included in a sealed envelope for you to review at your convenience later.  YOU SHOULD EXPECT: Some feelings of bloating in the abdomen. Passage of more gas than usual.  Walking can help get rid of the air that was put into your GI tract during the procedure and reduce the bloating. If you had a lower endoscopy (such as a colonoscopy or flexible sigmoidoscopy) you may notice spotting of blood in your stool or on the toilet paper. If you underwent a bowel prep for your procedure, you may not have a normal bowel movement for a few days.  Please Note:  You might notice some irritation and congestion in your nose or some drainage.  This is from the oxygen used during your procedure.  There is no need for concern and it should clear up in a day or so.  SYMPTOMS TO REPORT IMMEDIATELY:   Following lower endoscopy (colonoscopy or flexible sigmoidoscopy):  Excessive amounts of blood in the stool  Significant tenderness or worsening of abdominal pains  Swelling of the abdomen that is new, acute  Fever of 100F or higher   Following upper endoscopy (EGD)  Vomiting of blood or coffee ground material  New chest pain or pain under the shoulder blades  Painful or persistently difficult swallowing  New shortness of breath  Fever of 100F or  higher  Black, tarry-looking stools  For urgent or emergent issues, a gastroenterologist can be reached at any hour by calling [phone].   DIET: Your first meal following the procedure should be a small meal and then it is ok to progress to your normal diet. Heavy or fried foods are harder to digest and may make you feel nauseous or bloated.  Likewise, meals heavy in dairy and vegetables can increase bloating.  Drink plenty of fluids but you should avoid alcoholic beverages for 24 hours.  ACTIVITY:  You should plan to take it easy for the rest of today and you should NOT DRIVE or use heavy machinery until tomorrow (because of the sedation medicines used during the test).    FOLLOW UP: Our staff will call the number listed on your records the next business day following your procedure to check on you and address any questions or concerns that you may have regarding the information given to you following your procedure. If we do not reach you, we will leave a message.  However, if you are feeling well and you are not experiencing any problems, there is no need to return our call.  We will assume that you have returned to your regular daily activities without incident.  If any biopsies were taken you will be contacted by phone or by letter within the next 1-3 weeks.  Please call us at [phone] if you have not heard about the biopsies  in 3 weeks.    SIGNATURES/CONFIDENTIALITY: You and/or your care partner have signed paperwork which will be entered into your electronic medical record.  These signatures attest to the fact that that the information above on your After Visit Summary has been reviewed and is understood.  Full responsibility of the confidentiality of this discharge information lies with you and/or your care-partner.  Polyp and diverticulosis information given.

## 2015-05-26 NOTE — Progress Notes (Signed)
Called to room to assist during endoscopic procedure.  Patient ID and intended procedure confirmed with present staff. Received instructions for my participation in the procedure from the performing physician.  

## 2015-05-27 ENCOUNTER — Telehealth: Payer: Self-pay | Admitting: *Deleted

## 2015-05-27 NOTE — Telephone Encounter (Signed)
  Follow up Call-  Call back number 05/26/2015 01/08/2013  Post procedure Call Back phone  # [phone]  Permission to leave phone message Yes Yes     Patient questions:  Do you have a fever, pain , or abdominal swelling? No. Pain Score  0 *  Have you tolerated food without any problems? Yes.    Have you been able to return to your normal activities? Yes.    Do you have any questions about your discharge instructions: Diet   No. Medications  No. Follow up visit  No.  Do you have questions or concerns about your Care? No.  Actions: * If pain score is 4 or above: No action needed, pain <4.

## 2015-05-31 NOTE — Progress Notes (Signed)
Quick Note:  Biopsies show Crohn's like we thought Please ask her how she has been on the prednisone 40 mg daily ______

## 2015-06-01 NOTE — Progress Notes (Signed)
Quick Note:  On 11/11 change to 30 mg prednisone daily as long as ok Then wait 2 weeks and go to 20 mg daily Then after 1 month go to 15 mg daily x 1 month Then 10 mg daily See me in Jan  ______

## 2015-06-07 ENCOUNTER — Other Ambulatory Visit: Payer: Self-pay | Admitting: Internal Medicine

## 2015-06-07 NOTE — Telephone Encounter (Signed)
Ok to refill 

## 2015-06-09 ENCOUNTER — Ambulatory Visit: Payer: Medicare Other | Admitting: Internal Medicine

## 2015-06-20 ENCOUNTER — Encounter: Payer: Self-pay | Admitting: Internal Medicine

## 2015-06-20 ENCOUNTER — Ambulatory Visit (INDEPENDENT_AMBULATORY_CARE_PROVIDER_SITE_OTHER): Payer: Medicare Other | Admitting: Internal Medicine

## 2015-06-20 VITALS — BP 136/82 | HR 67 | Temp 98.2°F | Resp 16 | Ht 66.0 in | Wt 180.0 lb

## 2015-06-20 DIAGNOSIS — M1612 Unilateral primary osteoarthritis, left hip: Secondary | ICD-10-CM | POA: Diagnosis not present

## 2015-06-20 MED ORDER — NAPROXEN 500 MG PO TABS: 500.0000 mg | ORAL_TABLET | Freq: Two times a day (BID) | ORAL | Status: DC

## 2015-06-20 NOTE — Patient Instructions (Signed)
We have given you a medicine for arthritis called naproxen which you can use for your hip. It may bother your guts and stomach so if you have worsening problems please stop taking it and call us.  No blood work today.

## 2015-06-20 NOTE — Progress Notes (Signed)
Pre visit review using our clinic review tool, if applicable. No additional management support is needed unless otherwise documented below in the visit note. 

## 2015-06-21 DIAGNOSIS — M1612 Unilateral primary osteoarthritis, left hip: Secondary | ICD-10-CM | POA: Insufficient documentation

## 2015-06-21 NOTE — Progress Notes (Signed)
   Subjective:    Patient ID: [name] [name], female    DOB: 12/08/1940, 74 y.o.   MRN: [id number]  HPI The patient is a 74 YO female coming in for follow up of her arthritis. She is taking tramadol as needed for the pain. She would like to know if there are other options. She does have history of crohn's and flare about 1-2 months ago. She is having daily pain in her hip but does not take medicine for it daily. She is very sparing with that. She does not like taking medicines if not needed. No blood in her stool and the diarrhea is finally improving with the prednisone (currently on 20 mg daily).   Review of Systems  Constitutional: Positive for fatigue. Negative for fever, chills, activity change, appetite change and unexpected weight change.  HENT: Negative.   Eyes: Negative.   Respiratory: Negative for cough, chest tightness, shortness of breath and wheezing.   Cardiovascular: Negative for chest pain, palpitations and leg swelling.  Gastrointestinal: Negative for nausea, abdominal pain, diarrhea, constipation, blood in stool and abdominal distention.  Musculoskeletal: Positive for arthralgias. Negative for myalgias and back pain.  Skin: Negative.   Neurological: Negative.   Psychiatric/Behavioral: Negative.       Objective:   Physical Exam  Constitutional: She is oriented to person, place, and time. She appears well-developed and well-nourished.  HENT:  Head: Normocephalic and atraumatic.  Eyes: EOM are normal.  Neck: Normal range of motion.  Cardiovascular: Normal rate and regular rhythm.   Pulmonary/Chest: Effort normal. No respiratory distress. She has no wheezes. She has no rales.  Abdominal: Soft. Bowel sounds are normal. She exhibits no distension. There is no tenderness. There is no rebound.  Musculoskeletal: She exhibits no edema.  Left hip pain in the groin and not laterally.   Neurological: She is alert and oriented to person, place, and time. Coordination normal.  Skin:  Skin is warm and dry.   Filed Vitals:   06/20/15 1305  BP: 136/82  Pulse: 67  Temp: 98.2 F (36.8 C)  TempSrc: Oral  Resp: 16  Height: 5' 6"  (1.676 m)  Weight: 180 lb (81.647 kg)  SpO2: 90%      Assessment & Plan:

## 2015-06-21 NOTE — Assessment & Plan Note (Signed)
We did discuss that medicines like NSAIDS can be damaging to the GI tract and could cause worsening of her crohn's. She does want to try them. She is on concurrent PPI. Rx for naproxen which is the least damaging to the GI tract. She will take as rarely as possible.

## 2015-08-05 ENCOUNTER — Ambulatory Visit (INDEPENDENT_AMBULATORY_CARE_PROVIDER_SITE_OTHER): Payer: Medicare Other | Admitting: Internal Medicine

## 2015-08-05 ENCOUNTER — Encounter: Payer: Self-pay | Admitting: Internal Medicine

## 2015-08-05 VITALS — BP 154/70 | HR 60 | Ht 65.5 in | Wt 182.0 lb

## 2015-08-05 DIAGNOSIS — Z7952 Long term (current) use of systemic steroids: Secondary | ICD-10-CM

## 2015-08-05 DIAGNOSIS — K5010 Crohn's disease of large intestine without complications: Secondary | ICD-10-CM | POA: Diagnosis not present

## 2015-08-05 NOTE — Progress Notes (Signed)
   Subjective:    Patient ID: [name] [name], female    DOB: [birth year], 75 y.o.   MRN: [id number] Cc: f/u Crohn's HPI Doing well, occasional diarrhea has not had any bleeding since December. Taper down to 10 mg prednisone daily. A small abdominal cramps as well. Overall stable. She reports that she has new supplemental and Medicare part D insurance. Hoping that there may be better coverage for biologic therapies. Medications, allergies, past medical history, past surgical history, family history and social history are reviewed and updated in the EMR.   Review of Systems Left hip pain, chronic recurrent. She's had an injection which did not seem to help. She is trying some intermittent Naprosyn    Objective:   Physical Exam @BP  154/70 mmHg  Pulse 60  Ht 5' 5.5" (1.664 m)  Wt 182 lb (82.555 kg)  BMI 29.82 kg/m2@  General:  NAD Eyes:   anicteric Lungs:  clear Heart::  S1S2 no rubs, murmurs or gallops Abdomen:  soft and nontender, BS+ Ext:   no edema, cyanosis or clubbing    Data Reviewed:  Labs in November primary care notes. All in EMR.     Assessment & Plan:  Crohn's colitis Doing well at present To continue prednisone 10 mg qd Has new insurance - will see if she can get affordable biologic  Long term current use of systemic steroids - prednisone Allergies she has new insurance will see if there is a steroid sparing agent that she can tolerate and afford. We'll look into Cimzia, Stellara, Humira   Return in 6 months routine sooner if needed

## 2015-08-05 NOTE — Assessment & Plan Note (Signed)
Allergies she has new insurance will see if there is a steroid sparing agent that she can tolerate and afford. We'll look into Cimzia, Stellara, Humira

## 2015-08-05 NOTE — Assessment & Plan Note (Signed)
Doing well at present To continue prednisone 10 mg qd Has new insurance - will see if she can get affordable biologic

## 2015-08-05 NOTE — Patient Instructions (Signed)
  Stay on your current dose of 10 mg Prednisone daily.   Dr Carlean Purl is going to investigate biologic options for you with your new insurance.    Follow up with [place] in 6 months.    I appreciate the opportunity to care for you. [name], MD, [facility]

## 2015-08-08 ENCOUNTER — Telehealth: Payer: Self-pay | Admitting: Internal Medicine

## 2015-08-08 NOTE — Telephone Encounter (Signed)
Patient has a new prescription insurance card.  She will bring me a copy to send to [facility] with Stelara benefit investigation.

## 2015-08-09 ENCOUNTER — Other Ambulatory Visit: Payer: Self-pay

## 2015-08-09 MED ORDER — TRAMADOL HCL 50 MG PO TABS: 50.0000 mg | ORAL_TABLET | Freq: Three times a day (TID) | ORAL | Status: DC | PRN

## 2015-08-09 NOTE — Telephone Encounter (Signed)
Pt lm on triage and wanted a refill of tramadol.  pls advise. Pended for your review.

## 2015-08-10 NOTE — Telephone Encounter (Signed)
Faxed to pharm. Pt informed of same.

## 2015-08-15 ENCOUNTER — Other Ambulatory Visit: Payer: Self-pay | Admitting: Internal Medicine

## 2015-08-15 NOTE — Telephone Encounter (Signed)
Please advise Sir? Thank you. 

## 2015-08-16 NOTE — Telephone Encounter (Signed)
Refill prednisone x 2 Dicyclomine x 1 year

## 2015-08-17 ENCOUNTER — Telehealth: Payer: Self-pay | Admitting: *Deleted

## 2015-08-17 NOTE — Telephone Encounter (Signed)
Patient received paperwork from her insurance company. Had a question for Barb Merino RN.

## 2015-08-18 NOTE — Telephone Encounter (Signed)
Agree with plan 

## 2015-08-18 NOTE — Telephone Encounter (Signed)
Patient's insurance will not cover Stelara.  It is not on their formulary.  She must also try and fail Humira.  [facility] is going to try and get Humira approved and see what her out of pocket cost is.  Dr. Carlean Purl per your last office note you had Stelara, Humira, or Cimzia.  Is this ok to try Humira if approved and affordable to the patient?

## 2015-08-30 ENCOUNTER — Telehealth: Payer: Self-pay | Admitting: Internal Medicine

## 2015-08-30 NOTE — Telephone Encounter (Signed)
Patient will bring forms from [facility] for [place] to fill out our part for Humira assisatance

## 2015-09-05 NOTE — Telephone Encounter (Signed)
Scheduled an appt with her for 11/01/15 11:30

## 2015-09-05 NOTE — Telephone Encounter (Signed)
[employer] assistance program approved patient.  They will ship Humira out to her in approximately 2 weeks.  She is notified and provided the number to the [employer] program to enroll for teaching.

## 2015-09-05 NOTE — Telephone Encounter (Signed)
Please make an appointment for her to see me in april

## 2015-09-12 ENCOUNTER — Telehealth: Payer: Self-pay | Admitting: Internal Medicine

## 2015-09-12 NOTE — Telephone Encounter (Signed)
If still having those sxs tomorrow would postpone starting Humira until resolved

## 2015-09-12 NOTE — Telephone Encounter (Signed)
Patient reports nasal congestion and sinus drainage (clear).  She denies fever.  She has a rare cough.  She is not sure if this is allergies or a cold.  She is due to have injection training and her first injections tomorrow.  Please advise

## 2015-09-13 NOTE — Telephone Encounter (Signed)
Patient notified

## 2015-09-28 ENCOUNTER — Telehealth: Payer: Self-pay | Admitting: Internal Medicine

## 2015-09-28 NOTE — Telephone Encounter (Signed)
Dr. Carlean Purl patient had first Humira yesterday.  Should she start a prednisone taper at some point? She has follow up with you on 11/01/15.  Please advise

## 2015-09-28 NOTE — Telephone Encounter (Signed)
Patient notified She will call back if she has additional questions or concerns

## 2015-09-28 NOTE — Telephone Encounter (Signed)
She should be at and stay at 10 mg for now. Will take a while before we reduce dose

## 2015-10-11 ENCOUNTER — Telehealth: Payer: Self-pay | Admitting: Internal Medicine

## 2015-10-12 NOTE — Telephone Encounter (Signed)
Patient spoke with the ambassador this am and [name] [name] will send her 2 replacement pens.

## 2015-10-12 NOTE — Telephone Encounter (Signed)
No answer .  I will attempt to continue to reach the patietn. Patient will need to call [phone] to get a replacement pen.  I will provide a sample pen for one of the failed pens

## 2015-10-13 ENCOUNTER — Telehealth: Payer: Self-pay | Admitting: Internal Medicine

## 2015-10-14 NOTE — Telephone Encounter (Signed)
I left a authorization on the pharmacy line.

## 2015-10-26 ENCOUNTER — Other Ambulatory Visit: Payer: Self-pay | Admitting: Internal Medicine

## 2015-11-01 ENCOUNTER — Ambulatory Visit (INDEPENDENT_AMBULATORY_CARE_PROVIDER_SITE_OTHER): Payer: Medicare Other | Admitting: Internal Medicine

## 2015-11-01 ENCOUNTER — Other Ambulatory Visit (INDEPENDENT_AMBULATORY_CARE_PROVIDER_SITE_OTHER): Payer: Medicare Other

## 2015-11-01 ENCOUNTER — Encounter: Payer: Self-pay | Admitting: Internal Medicine

## 2015-11-01 VITALS — BP 130/74 | HR 60 | Ht 65.5 in | Wt 182.0 lb

## 2015-11-01 DIAGNOSIS — K5010 Crohn's disease of large intestine without complications: Secondary | ICD-10-CM | POA: Diagnosis not present

## 2015-11-01 DIAGNOSIS — Z7952 Long term (current) use of systemic steroids: Secondary | ICD-10-CM

## 2015-11-01 DIAGNOSIS — R739 Hyperglycemia, unspecified: Secondary | ICD-10-CM | POA: Diagnosis not present

## 2015-11-01 DIAGNOSIS — M1612 Unilateral primary osteoarthritis, left hip: Secondary | ICD-10-CM

## 2015-11-01 DIAGNOSIS — R5383 Other fatigue: Secondary | ICD-10-CM

## 2015-11-01 DIAGNOSIS — L270 Generalized skin eruption due to drugs and medicaments taken internally: Secondary | ICD-10-CM

## 2015-11-01 LAB — COMPREHENSIVE METABOLIC PANEL
ALBUMIN: 3.8 g/dL (ref 3.5–5.2)
ALT: 18 U/L (ref 0–35)
AST: 22 U/L (ref 0–37)
Alkaline Phosphatase: 53 U/L (ref 39–117)
BILIRUBIN TOTAL: 0.5 mg/dL (ref 0.2–1.2)
BUN: 15 mg/dL (ref 6–23)
CALCIUM: 9.5 mg/dL (ref 8.4–10.5)
CO2: 28 mEq/L (ref 19–32)
Chloride: 98 mEq/L (ref 96–112)
Creatinine, Ser: 0.92 mg/dL (ref 0.40–1.20)
GFR: 63.42 mL/min (ref >60.00)
Glucose, Bld: 124 mg/dL — ABNORMAL HIGH (ref 70–99)
POTASSIUM: 3.5 meq/L (ref 3.5–5.1)
Sodium: 133 mEq/L — ABNORMAL LOW (ref 135–145)
TOTAL PROTEIN: 7.7 g/dL (ref 6.0–8.3)

## 2015-11-01 LAB — CBC WITH DIFFERENTIAL/PLATELET
BASOS ABS: 0 10*3/uL (ref 0.0–0.1)
Basophils Relative: 0.2 % (ref 0.0–3.0)
EOS PCT: 0.1 % (ref 0.0–5.0)
Eosinophils Absolute: 0 10*3/uL (ref 0.0–0.7)
HEMATOCRIT: 42.8 % (ref 36.0–46.0)
HEMOGLOBIN: 14.1 g/dL (ref 12.0–15.0)
LYMPHS ABS: 1.7 10*3/uL (ref 0.7–4.0)
LYMPHS PCT: 9.6 % — AB (ref 12.0–46.0)
MCHC: 32.9 g/dL (ref 30.0–36.0)
MCV: 92.3 fl (ref 78.0–100.0)
MONOS PCT: 3.6 % (ref 3.0–12.0)
Monocytes Absolute: 0.6 10*3/uL (ref 0.1–1.0)
Neutro Abs: 15.5 10*3/uL — ABNORMAL HIGH (ref 1.4–7.7)
Neutrophils Relative %: 86.5 % — ABNORMAL HIGH (ref 43.0–77.0)
Platelets: 519 10*3/uL — ABNORMAL HIGH (ref 150.0–400.0)
RBC: 4.64 Mil/uL (ref 3.87–5.11)
RDW: 13.6 % (ref 11.5–15.5)

## 2015-11-01 LAB — TSH: TSH: 1.11 u[IU]/mL (ref 0.35–4.50)

## 2015-11-01 LAB — HEMOGLOBIN A1C: Hgb A1c MFr Bld: 5.9 % (ref 4.6–6.5)

## 2015-11-01 NOTE — Progress Notes (Signed)
   Subjective:    Patient ID: [name] [name], female    DOB: 1941-04-04, 75 y.o.   MRN: [id number] Chief complaint: Follow-up Crohn's colitis recent Humira start HPI Doing well on Humira x 3 doses so far, 160, 80 and then 40 ug. Says left hip much better since starting. Has had mildly itchy rash upper chest area, better each dose - started after first dosing. Using some HC cream. Wants to reduce prednisone. Not having reg diarrhea or bleeding but has experimented with a high-fiver juice drink and had some loose stools. Remains tired and fatigued "I want to get my energy back".  Medications, allergies, past medical history, past surgical history, family history and social history are reviewed and updated in the EMR.  Review of Systems As above    Objective:   Physical Exam @BP  130/74 mmHg  Pulse 60  Ht 5' 5.5" (1.664 m)  Wt 182 lb (82.555 kg)  BMI 29.82 kg/m2@  General:  NAD Eyes:   anicteric Lungs:  clear Heart:: S1S2 no rubs, murmurs or gallops Abdomen:  soft and nontender, BS+ Ext:   no edema, cyanosis or clubbing Skin:   faint papular rash (very faint) in upper chest sun-exposure area     Data Reviewed:  As above     Assessment & Plan:   1. Crohn's colitis, without complications ([place])   2. Long term current use of systemic steroids - prednisone   3. Hyperglycemia   4. Osteoarthritis of left hip, unspecified osteoarthritis type   5. Rash, drug   6. Other fatigue    He seems to be doing well. We'll check labs to include CBC, CMET, Hgb A1C, TSH - he wanted me to get labs that her primary care provider might need she has an appointment with her in a few weeks In May 10 mg5 mg alternating on prednisone. She should contact me a month after that and if she is doing well still will go to 5 mg daily See me [place] She will watch the rash I am hopeful this will dissipate. It might of been due to the higher doses of Humira I think it was probably related to that but I don't  think it's an allergy.

## 2015-11-01 NOTE — Assessment & Plan Note (Signed)
Improved on Humira she says

## 2015-11-01 NOTE — Patient Instructions (Signed)
  Your physician has requested that you go to the basement for the following lab work before leaving today: CBC/diff, CMET, Hgb A1C, TSH   In May you may alternate your prednisone 54m with 156m  Check back to [facility] June to see if we need to adjust your dose.    Follow up with [facility] July 2017.    I appreciate the opportunity to care for you.

## 2015-11-01 NOTE — Assessment & Plan Note (Signed)
Improved now on Humira since Mar 2017

## 2015-11-01 NOTE — Assessment & Plan Note (Signed)
10/5 start taper in May

## 2015-11-01 NOTE — Assessment & Plan Note (Signed)
Check A1 c taper prednisone when we can

## 2015-11-02 NOTE — Progress Notes (Signed)
Quick Note:  Labs look good overall  Elevated WBC from prednisone most likely will f/u over time as we taper Thyroid ok, Hgb NL - think exercise w/ walking will help fatigue - it is not from medical issue Hgb A1 C is good (prediabetic not diabetic)  Dr. Sharlet Salina will see these also and use when she sees her in a few weeks  Remind Ms Pfahler to update me in late May as to how she is doing and if doing ok as she moves into June will reduce prednisone to 5 mg daily (going to 10/5 alternating early May) ______

## 2015-11-10 DIAGNOSIS — H43813 Vitreous degeneration, bilateral: Secondary | ICD-10-CM | POA: Diagnosis not present

## 2015-11-25 ENCOUNTER — Other Ambulatory Visit: Payer: Self-pay | Admitting: Internal Medicine

## 2015-12-09 ENCOUNTER — Telehealth: Payer: Self-pay | Admitting: Internal Medicine

## 2015-12-09 NOTE — Telephone Encounter (Signed)
Try hydrocortisone OTC cream on injection site for rsh and I suggest she try OTC Zyrtec 10 mg daily day before and day off and a few days after the Humira - she can start that now and take for a few days to see if that diminishes sxs

## 2015-12-09 NOTE — Telephone Encounter (Signed)
Pt aware.

## 2015-12-09 NOTE — Telephone Encounter (Signed)
Pt states she was to call back and let Dr. Carlean Purl know how she was doing on prednisone taper. States she is doing well on the prednisone taper. Pt reports she took her Humira injection today and when she does this she has some nausea and diarrhea that lasts for about 3-4 days. States the rash she has had comes back when she does the injection, states it is red and itches. Dr. Carlean Purl aware.

## 2015-12-20 ENCOUNTER — Encounter: Payer: Self-pay | Admitting: Internal Medicine

## 2015-12-20 ENCOUNTER — Ambulatory Visit (INDEPENDENT_AMBULATORY_CARE_PROVIDER_SITE_OTHER): Payer: Medicare Other | Admitting: Internal Medicine

## 2015-12-20 VITALS — BP 130/78 | HR 57 | Temp 97.0°F | Resp 18 | Ht 66.0 in | Wt 178.4 lb

## 2015-12-20 DIAGNOSIS — M1990 Unspecified osteoarthritis, unspecified site: Secondary | ICD-10-CM | POA: Diagnosis not present

## 2015-12-20 DIAGNOSIS — I10 Essential (primary) hypertension: Secondary | ICD-10-CM

## 2015-12-20 NOTE — Assessment & Plan Note (Signed)
Much better on the humira and likely was inflammatory with her crohn's.

## 2015-12-20 NOTE — Progress Notes (Signed)
Pre visit review using our clinic review tool, if applicable. No additional management support is needed unless otherwise documented below in the visit note. 

## 2015-12-20 NOTE — Progress Notes (Signed)
   Subjective:    Patient ID: [place], female    DOB: 05-21-1941, 75 y.o.   MRN: [id number]  HPI The patient is a 75 YO female coming in for follow up of her blood pressure. She is still taking her meds and no side effects. BP is well controlled. Not complicated. Her arthritis is doing better since starting humira for her crohn's disease. Following with GI. Mild rash from that but not severe.   Review of Systems  Constitutional: Positive for fatigue. Negative for fever, chills, activity change, appetite change and unexpected weight change.  Respiratory: Negative for cough, chest tightness, shortness of breath and wheezing.   Cardiovascular: Negative for chest pain, palpitations and leg swelling.  Gastrointestinal: Negative for nausea, abdominal pain, diarrhea, constipation, blood in stool and abdominal distention.  Musculoskeletal: Negative for myalgias and back pain.  Skin: Negative.   Neurological: Negative.   Psychiatric/Behavioral: Negative.       Objective:   Physical Exam  Constitutional: She is oriented to person, place, and time. She appears well-developed and well-nourished.  HENT:  Head: Normocephalic and atraumatic.  Eyes: EOM are normal.  Neck: Normal range of motion.  Cardiovascular: Normal rate and regular rhythm.   Pulmonary/Chest: Effort normal. No respiratory distress. She has no wheezes. She has no rales.  Abdominal: Soft. Bowel sounds are normal. She exhibits no distension. There is no tenderness. There is no rebound.  Musculoskeletal: She exhibits no edema.  Neurological: She is alert and oriented to person, place, and time. Coordination normal.  Skin: Skin is warm and dry.   Filed Vitals:   12/20/15 1300  BP: 130/78  Pulse: 57  Temp: 97 F (36.1 C)  TempSrc: Oral  Resp: 18  Height: 5' 6"  (1.676 m)  Weight: 178 lb 6.4 oz (80.922 kg)  SpO2: 94%      Assessment & Plan:

## 2015-12-20 NOTE — Assessment & Plan Note (Signed)
BP at goal on amlodipine, bystolic and hctz. Last CMP normal from GI and no adjustments needed today.

## 2015-12-20 NOTE — Patient Instructions (Signed)
We do not need any blood work today and no changes to the medicine.

## 2016-01-09 ENCOUNTER — Ambulatory Visit (INDEPENDENT_AMBULATORY_CARE_PROVIDER_SITE_OTHER)
Admission: RE | Admit: 2016-01-09 | Discharge: 2016-01-09 | Disposition: A | Discharge status: Home / Self Care | Payer: Medicare Other | Source: Ambulatory Visit | Attending: Family | Admitting: Family

## 2016-01-09 ENCOUNTER — Ambulatory Visit (INDEPENDENT_AMBULATORY_CARE_PROVIDER_SITE_OTHER): Payer: Medicare Other | Admitting: Family

## 2016-01-09 ENCOUNTER — Encounter: Payer: Self-pay | Admitting: Family

## 2016-01-09 VITALS — BP 132/72 | HR 61 | Temp 98.1°F | Ht 66.0 in | Wt 175.5 lb

## 2016-01-09 DIAGNOSIS — M25552 Pain in left hip: Secondary | ICD-10-CM

## 2016-01-09 DIAGNOSIS — Z8669 Personal history of other diseases of the nervous system and sense organs: Secondary | ICD-10-CM

## 2016-01-09 DIAGNOSIS — M47816 Spondylosis without myelopathy or radiculopathy, lumbar region: Secondary | ICD-10-CM | POA: Diagnosis not present

## 2016-01-09 MED ORDER — DICLOFENAC SODIUM 1 % TD GEL: 4.0000 g | Freq: Four times a day (QID) | TRANSDERMAL | Status: DC

## 2016-01-09 NOTE — Progress Notes (Signed)
Pre visit review using our clinic review tool, if applicable. No additional management support is needed unless otherwise documented below in the visit note. 

## 2016-01-09 NOTE — Progress Notes (Signed)
Subjective:    Patient ID: [place], female    DOB: 1940/12/02, 75 y.o.   MRN: [id number]   [place] is a 75 y.o. female who presents today for an acute visit.    HPI Comments: She here for evaluation of multiple complaints.   She complains of left hip pain for the past couple weeks, worsening. Pain with standing, has to stand on right leg. Cannot lie on left side. Pain improves with rest. Aleve and tramadol helped some. Pain radiates to LLE. No numbness, tingling in LE, or falls. No weakness, saddle anesthesia. H/o LBP with arthritis. 'Get shots in my back' from orthopedics. No OP, no h/o fractures. Aggravates her Crohn's and gets some bleeding when she takes NSAIDs.  She also complains of left-sided her face is "drawing up" this morning. 'Just beginning.'  History of Bell's palsy. Eyes are not severely dry ( has chronic dry eye) but not worse today. No changes in taste or numbness, tingling in face. No vision changes, confusion, slurring words, exertional chest pain or pressure, numbness or tingling radiating to left arm or jaw, palpitations, dizziness, frequent headaches, changes in vision, or shortness of breath. Has been taking Humira for Crohn's, follows with GI who is also trying to get her off of prednisone. Takes prednisone daily for the past 5 years. Alternates between 5 mg one day and 10 mg the next.      Past Medical History  Diagnosis Date  . Crohn's colitis ([place])   . Hypertension   . Colon polyps     adenomatous polyp June 2012  . Complication of anesthesia     slow to wake  . Shortness of breath   . Blood transfusion     1966  . Headache(784.0)     hx migraines  . Arthritis     shoulder  . Bell's palsy   . GERD (gastroesophageal reflux disease)   . Dry eyes     left eye worst one  . Blood transfusion without reported diagnosis   . Allergy     year around allergies   . Cataract     bilaterally removed   Allergies: Azathioprine; Entyvio; Adhesive;  and Sulfa antibiotics Current Outpatient Prescriptions on File Prior to Visit  Medication Sig Dispense Refill  . Adalimumab (HUMIRA) 40 MG/0.8ML PSKT Inject into the skin. Inj. Every 2 weeks.    Marland Kitchen amLODipine (NORVASC) 5 MG tablet TAKE 1 TABLET (5 MG TOTAL) BY MOUTH AT BEDTIME. 90 tablet 3  . b complex vitamins capsule Take 1 capsule by mouth daily.      Marland Kitchen BYSTOLIC 10 MG tablet Take 1 tablet (10 mg total) by mouth daily. 90 tablet 3  . Calcium Carbonate-Vit D-Min (CALCIUM 1200) 1200-1000 MG-UNIT CHEW Take 1 chew by mouth once daily 1 each 0  . carboxymethylcellulose (REFRESH PLUS) 0.5 % SOLN Place 1 drop into both eyes 3 (three) times daily as needed (dry eyes).     . cetirizine (ZYRTEC) 10 MG tablet Take 10 mg by mouth daily as needed for allergies.     . Cholecalciferol (VITAMIN D) 2000 UNITS CAPS Take 1 capsule (2,000 Units total) by mouth daily. 1 capsule 0  . dicyclomine (BENTYL) 20 MG tablet TAKE 1 TABLET (20 MG TOTAL) BY MOUTH 3 (THREE) TIMES DAILY. FOR STOMACH PAIN 90 tablet 11  . diphenoxylate-atropine (LOMOTIL) 2.5-0.025 MG tablet TAKE 1 TO 2 TABLETS BY MOUTH EVERY 4 HOURS AS NEEDED FOR DIARRHEA 90 tablet 1  .  famotidine (PEPCID) 20 MG tablet Take 20 mg by mouth 2 (two) times daily.    . hydrochlorothiazide (HYDRODIURIL) 25 MG tablet Take 1 tablet (25 mg total) by mouth daily. 90 tablet 3  . Multiple Vitamins-Minerals (MULTIVITAMINS THER. W/MINERALS) TABS Take 1 tablet by mouth daily.      . ondansetron (ZOFRAN) 4 MG tablet Take 1 tablet (4 mg total) by mouth every 8 (eight) hours as needed for nausea or vomiting. 30 tablet 1  . pantoprazole (PROTONIX) 40 MG tablet Take 1 tablet (40 mg total) by mouth daily. 90 tablet 3  . predniSONE (DELTASONE) 10 MG tablet TAKE AS DIRECTED. 100 tablet 1  . traMADol (ULTRAM) 50 MG tablet Take 1 tablet (50 mg total) by mouth every 8 (eight) hours as needed. 60 tablet 2   No current facility-administered medications on file prior to visit.    Social  History  Substance Use Topics  . Smoking status: Former Smoker -- 0.25 packs/day for 10 years    Types: Cigarettes    Quit date: 09/26/1968  . Smokeless tobacco: Never Used  . Alcohol Use: No     Comment: rare wine    Review of Systems  Constitutional: Negative for fever and chills.  Respiratory: Negative for cough.   Cardiovascular: Negative for chest pain and palpitations.  Gastrointestinal: Negative for nausea and vomiting.  Musculoskeletal: Positive for back pain. Negative for myalgias, joint swelling and gait problem.  Skin: Negative for rash.  Neurological: Negative for dizziness, seizures, syncope, numbness and headaches.      Objective:    BP 132/72 mmHg  Pulse 61  Temp(Src) 98.1 F (36.7 C) (Oral)  Ht 5' 6"  (1.676 m)  Wt 175 lb 8 oz (79.606 kg)  BMI 28.34 kg/m2  SpO2 96%   Physical Exam  Constitutional: She appears well-developed and well-nourished.  HENT:  Mouth/Throat: Uvula is midline, oropharynx is clear and moist and mucous membranes are normal.  Eyes: Conjunctivae and EOM are normal. Pupils are equal, round, and reactive to light.  Fundus normal bilaterally.   Cardiovascular: Normal rate, regular rhythm, normal heart sounds and normal pulses.   Pulmonary/Chest: Effort normal and breath sounds normal. She has no wheezes. She has no rhonchi. She has no rales.  Musculoskeletal:       Lumbar back: She exhibits normal range of motion, no tenderness, no bony tenderness, no edema, no pain and no spasm.       Back:    Left  Hip: Slight  Limp.. Full ROM. Pain of lateral hip with  (flexion-abduction-external rotation) test. No pain with deep palpation of greater trochanter. Pain with palpation of SI joint.  Single leg raise causes pain in left hip and low back. No numbness and tingling in either leg with test.    Neurological: She is alert. She has normal strength. No cranial nerve deficit or sensory deficit. She displays a negative Romberg sign.  Reflex  Scores:      Bicep reflexes are 2+ on the right side and 2+ on the left side.      Patellar reflexes are 2+ on the right side and 2+ on the left side. Grip equal and strong bilateral upper extremities. Gait strong and steady. Able to perform rapid alternating movement without difficulty.   Skin: Skin is warm and dry.  Psychiatric: She has a normal mood and affect. Her speech is normal and behavior is normal. Thought content normal.  Vitals reviewed.      Assessment & Plan:  1. 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 Pending XR. Advised patient to stop NSAIDs altogether as it interferes with her Crohn's and causes bleeding.  - diclofenac sodium (VOLTAREN) 1 % GEL; Apply 4 g topically 4 (four) times daily.  Dispense: 1 Tube; Refill: 3 - DG Lumbar Spine Complete; Future  2. H/O Bell's palsy Perhaps early presentation. No eyebrow sagging, inability to close her eye or disappearance of nasolabial fold. No drooping of the mouth, or severe dry. I have messaged patient's GI physician regarding starting high-dose prednisone taper for early treatment of Bell's to ensure will not interfere with Crohn's treatment.  I am having Ms. Longmore maintain her cetirizine, b complex vitamins, multivitamins ther. w/minerals, CALCIUM 1200, Vitamin D, carboxymethylcellulose, pantoprazole, BYSTOLIC, hydrochlorothiazide, famotidine, ondansetron, diphenoxylate-atropine, traMADol, dicyclomine, predniSONE, Adalimumab, and amLODipine.   No orders of the defined types were placed in this encounter.     Start medications as prescribed and explained to patient on After Visit Summary ( AVS). Risks, benefits, and alternatives of the medications and treatment plan prescribed today were discussed, and patient expressed understanding.   Education regarding symptom management and diagnosis given to patient.   Follow-up:Plan follow-up and return precautions given if any worsening symptoms or change in condition.   Continue to follow with Hoyt Koch, MD for routine health maintenance.   Vania Rea and I agreed with plan.   Mable Paris, FNP

## 2016-01-09 NOTE — Patient Instructions (Addendum)
Will message GI doctor about prednisone.   MRI low back and please follow up with orthopedic about joint injection.   Stop aleve. Try heat and topical cream.   If there is no improvement in your symptoms, or if there is any worsening of symptoms, or if you have any additional concerns, please return for re-evaluation; or, if we are closed, consider going to the Emergency Room for evaluation if symptoms urgent.   Bell Palsy Bell palsy is a condition in which the muscles on one side of the face become paralyzed. This often causes one side of the face to droop. It is a common condition and most people recover completely. RISK FACTORS Risk factors for Bell palsy include:  Pregnancy.  Diabetes.  An infection by a virus, such as infections that cause cold sores. CAUSES  Bell palsy is caused by damage to or inflammation of a nerve in your face. It is unclear why this happens, but an infection by a virus may lead to it. Most of the time the reason it happens is unknown. SIGNS AND SYMPTOMS  Symptoms can range from mild to severe and can take place over a number of hours. Symptoms may include:  Being unable to:  Raise one or both eyebrows.  Close one or both eyes.  Feel parts of your face (facial numbness).  Drooping of the eyelid and corner of the mouth.  Weakness in the face.  Paralysis of half your face.  Loss of taste.  Sensitivity to loud noises.  Difficulty chewing.  Tearing up of the affected eye.  Dryness in the affected eye.  Drooling.  Pain behind one ear. DIAGNOSIS  Diagnosis of Bell palsy may include:  A medical history and physical exam.  An MRI.  A CT scan.  Electromyography (EMG). This is a test that checks how your nerves are working. TREATMENT  Treatment may include antiviral medicine to help shorten the length of the condition. Sometimes treatment is not needed and the symptoms go away on their own. HOME CARE INSTRUCTIONS   Take medicines only  as directed by your health care provider.  Do facial massages and exercises as directed by your health care provider.  If your eye is affected:  Use moisturizing eye drops to prevent drying of your eye as directed by your health care provider.  Protect your eye as directed by your health care provider. SEEK MEDICAL CARE IF:  Your symptoms do not get better or get worse.  You are drooling.  Your eye is red, irritated, or hurts. SEEK IMMEDIATE MEDICAL CARE IF:   Another part of your body feels weak or numb.  You have difficulty swallowing.  You have a fever along with symptoms of Bell palsy.  You develop neck pain. MAKE SURE YOU:   Understand these instructions.  Will watch your condition.  Will get help right away if you are not doing well or get worse.   This information is not intended to replace advice given to you by your health care provider. Make sure you discuss any questions you have with your health care provider.   Document Released: 07/09/2005 Document Revised: 03/30/2015 Document Reviewed: 10/16/2013 Elsevier Interactive Patient Education 2016 [employer] Sacroiliac Joint Dysfunction Sacroiliac joint dysfunction is a condition that causes inflammation on one or both sides of the sacroiliac (SI) joint. The SI joint connects the lower part of the spine (sacrum) with the two upper portions of the pelvis (ilium). This condition causes deep aching or burning  pain in the low back. In some cases, the pain may also spread into one or both buttocks or hips or spread down the legs. CAUSES This condition may be caused by:  Pregnancy. During pregnancy, extra stress is put on the SI joints because the pelvis widens.  Injury, such as:  Car accidents.  Sport-related injuries.  Work-related injuries.  Having one leg that is shorter than the other.  Conditions that affect the joints, such as:  Rheumatoid arthritis.  Gout.  Psoriatic arthritis.  Joint  infection (septic arthritis). Sometimes, the cause of SI joint dysfunction is not known. SYMPTOMS Symptoms of this condition include:  Aching or burning pain in the lower back. The pain may also spread to other areas, such as:  Buttocks.  Groin.  Thighs and legs.  Muscle spasms in or around the painful areas.  Increased pain when standing, walking, running, stair climbing, bending, or lifting. DIAGNOSIS Your health care provider will do a physical exam and take your medical history. During the exam, the health care provider may move one or both of your legs to different positions to check for pain. Various tests may be done to help verify the diagnosis, including:  Imaging tests to look for other causes of pain. These may include:  MRI.  CT scan.  Bone scan.  Diagnostic injection. A numbing medicine is injected into the SI joint using a needle. If the pain is temporarily improved or stopped after the injection, this can indicate that SI joint dysfunction is the problem. TREATMENT Treatment may vary depending on the cause and severity of your condition. Treatment options may include:  Applying ice or heat to the lower back area. This can help to reduce pain and muscle spasms.  Medicines to relieve pain or inflammation or to relax the muscles.  Wearing a back brace (sacroiliac brace) to help support the joint while your back is healing.  Physical therapy to increase muscle strength around the joint and flexibility at the joint. This may also involve learning proper body positions and ways of moving to relieve stress on the joint.  Direct manipulation of the SI joint.  Injections of steroid medicine into the joint in order to reduce pain and swelling.  Radiofrequency ablation to burn away nerves that are carrying pain messages from the joint.  Use of a device that provides electrical stimulation in order to reduce pain at the joint.  Surgery to put in screws and plates  that limit or prevent joint motion. This is rare. HOME CARE INSTRUCTIONS  Rest as needed. Limit your activities as directed by your health care provider.  Take medicines only as directed by your health care provider.  If directed, apply ice to the affected area:  Put ice in a plastic bag.  Place a towel between your skin and the bag.  Leave the ice on for 20 minutes, 2-3 times per day.  Use a heating pad or a moist heat pack as directed by your health care provider.  Exercise as directed by your health care provider or physical therapist.  Keep all follow-up visits as directed by your health care provider. This is important. SEEK MEDICAL CARE IF:  Your pain is not controlled with medicine.  You have a fever.  You have increasingly severe pain. SEEK IMMEDIATE MEDICAL CARE IF:  You have weakness, numbness, or tingling in your legs or feet.  You lose control of your bladder or bowel.   This information is not intended to replace  advice given to you by your health care provider. Make sure you discuss any questions you have with your health care provider.   Document Released: 10/05/2008 Document Revised: 11/23/2014 Document Reviewed: 03/16/2014 Elsevier Interactive Patient Education [employer].

## 2016-01-10 ENCOUNTER — Telehealth: Payer: Self-pay | Admitting: Family

## 2016-01-10 NOTE — Telephone Encounter (Signed)
Patient states she's feeling better today and her face is not "drawing up". She's not concerned about a Bell's palsy flare at this time. I advised her if her symptoms start to recur to let her office know as well as her GI doctor so we can to manage her prednisone. She agreed with this plan.

## 2016-01-16 DIAGNOSIS — M5416 Radiculopathy, lumbar region: Secondary | ICD-10-CM | POA: Diagnosis not present

## 2016-01-16 DIAGNOSIS — M4806 Spinal stenosis, lumbar region: Secondary | ICD-10-CM | POA: Diagnosis not present

## 2016-02-08 DIAGNOSIS — M4806 Spinal stenosis, lumbar region: Secondary | ICD-10-CM | POA: Diagnosis not present

## 2016-02-08 DIAGNOSIS — M5416 Radiculopathy, lumbar region: Secondary | ICD-10-CM | POA: Diagnosis not present

## 2016-02-10 ENCOUNTER — Ambulatory Visit (INDEPENDENT_AMBULATORY_CARE_PROVIDER_SITE_OTHER): Payer: Medicare Other | Admitting: Internal Medicine

## 2016-02-10 ENCOUNTER — Encounter: Payer: Self-pay | Admitting: Internal Medicine

## 2016-02-10 VITALS — BP 132/80 | HR 58 | Ht 66.0 in | Wt 175.6 lb

## 2016-02-10 DIAGNOSIS — Z79899 Other long term (current) drug therapy: Secondary | ICD-10-CM | POA: Diagnosis not present

## 2016-02-10 DIAGNOSIS — Z7952 Long term (current) use of systemic steroids: Secondary | ICD-10-CM | POA: Diagnosis not present

## 2016-02-10 DIAGNOSIS — K5010 Crohn's disease of large intestine without complications: Secondary | ICD-10-CM | POA: Diagnosis not present

## 2016-02-10 DIAGNOSIS — Z7960 Long term (current) use of unspecified immunomodulators and immunosuppressants: Secondary | ICD-10-CM

## 2016-02-10 NOTE — Progress Notes (Signed)
   Subjective:  Cc: f/u Crohn's colitis  Patient ID: [place], female    DOB: Dec 14, 1940, 75 y.o.   MRN: [id number]  HPI Doing well 3-4 self limited bleeding episodes  No sig diarrhea No itching or rash with Humira now that taking diphenhydramine prophylaxis Is on prdnisone 23m/5mg alternating  Medications, allergies, past medical history, past surgical history, family history and social history are reviewed and updated in the EMR.  Review of Systems Back pain - getting injections    Objective:   Physical Exam BP 132/80 mmHg  Pulse 58  Ht 5' 6"  (1.676 m)  Wt 175 lb 9.6 oz (79.652 kg)  BMI 28.36 kg/m2  SpO2 98%     Assessment & Plan:   Encounter Diagnoses  Name Primary?  . Crohn's colitis, without complications [place]  . Long-term use of immunosuppressant medication   . Long term current use of systemic steroids    CEK:[id number] Augusta MD

## 2016-02-10 NOTE — Patient Instructions (Signed)
Per Dr Carlean Purl go to Prednisone 45m daily and come back to see uKoreain November.    I appreciate the opportunity to care for you. CSilvano Rusk MD, [facility]

## 2016-02-12 ENCOUNTER — Encounter: Payer: Self-pay | Admitting: Internal Medicine

## 2016-03-01 ENCOUNTER — Ambulatory Visit: Payer: Medicare Other | Admitting: Internal Medicine

## 2016-03-01 ENCOUNTER — Ambulatory Visit (INDEPENDENT_AMBULATORY_CARE_PROVIDER_SITE_OTHER): Payer: Medicare Other | Admitting: Family

## 2016-03-01 ENCOUNTER — Encounter: Payer: Self-pay | Admitting: Family

## 2016-03-01 DIAGNOSIS — M62838 Other muscle spasm: Secondary | ICD-10-CM | POA: Insufficient documentation

## 2016-03-01 DIAGNOSIS — M6249 Contracture of muscle, multiple sites: Secondary | ICD-10-CM | POA: Diagnosis not present

## 2016-03-01 MED ORDER — CYCLOBENZAPRINE HCL 10 MG PO TABS: 5.0000 mg | ORAL_TABLET | Freq: Three times a day (TID) | ORAL | 0 refills | Status: DC | PRN

## 2016-03-01 NOTE — Assessment & Plan Note (Signed)
Symptoms and exam consistent with cervical paraspinal muscle spasms unrelated to trauma or injury. Treat conservatively with ice/heat, home exercise therapy, and start cyclobenzaprine as needed for muscle spasms. Follow-up if symptoms worsen or do not improve.

## 2016-03-01 NOTE — Patient Instructions (Signed)
Thank you for choosing [facility].  Summary/Instructions:  Ice/heat x 20 minutes every 2 hours as needed.  Stretches and exercises multiple times throughout the day.  Your prescription(s) have been submitted to your pharmacy or been printed and provided for you. Please take as directed and contact our office if you believe you are having problem(s) with the medication(s) or have any questions.  If your symptoms worsen or fail to improve, please contact our office for further instruction, or in case of emergency go directly to the emergency room at the closest medical facility.    Cervical Strain and Sprain With Rehab Cervical strain and sprain are injuries that commonly occur with "whiplash" injuries. Whiplash occurs when the neck is forcefully whipped backward or forward, such as during a motor vehicle accident or during contact sports. The muscles, ligaments, tendons, discs, and nerves of the neck are susceptible to injury when this occurs. RISK FACTORS Risk of having a whiplash injury increases if:  Osteoarthritis of the spine.  Situations that make head or neck accidents or trauma more likely.  High-risk sports (football, rugby, wrestling, hockey, auto racing, gymnastics, diving, contact karate, or boxing).  Poor strength and flexibility of the neck.  Previous neck injury.  Poor tackling technique.  Improperly fitted or padded equipment. SYMPTOMS   Pain or stiffness in the front or back of neck or both.  Symptoms may present immediately or up to 24 hours after injury.  Dizziness, headache, nausea, and vomiting.  Muscle spasm with soreness and stiffness in the neck.  Tenderness and swelling at the injury site. PREVENTION  Learn and use proper technique (avoid tackling with the head, spearing, and head-butting; use proper falling techniques to avoid landing on the head).  Warm up and stretch properly before activity.  Maintain physical fitness:  Strength,  flexibility, and endurance.  Cardiovascular fitness.  Wear properly fitted and padded protective equipment, such as padded soft collars, for participation in contact sports. PROGNOSIS  Recovery from cervical strain and sprain injuries is dependent on the extent of the injury. These injuries are usually curable in 1 week to 3 months with appropriate treatment.  RELATED COMPLICATIONS   Temporary numbness and weakness may occur if the nerve roots are damaged, and this may persist until the nerve has completely healed.  Chronic pain due to frequent recurrence of symptoms.  Prolonged healing, especially if activity is resumed too soon (before complete recovery). TREATMENT  Treatment initially involves the use of ice and medication to help reduce pain and inflammation. It is also important to perform strengthening and stretching exercises and modify activities that worsen symptoms so the injury does not get worse. These exercises may be performed at home or with a therapist. For patients who experience severe symptoms, a soft, padded collar may be recommended to be worn around the neck.  Improving your posture may help reduce symptoms. Posture improvement includes pulling your chin and abdomen in while sitting or standing. If you are sitting, sit in a firm chair with your buttocks against the back of the chair. While sleeping, try replacing your pillow with a small towel rolled to 2 inches in diameter, or use a cervical pillow or soft cervical collar. Poor sleeping positions delay healing.  For patients with nerve root damage, which causes numbness or weakness, the use of a cervical traction apparatus may be recommended. Surgery is rarely necessary for these injuries. However, cervical strain and sprains that are present at birth (congenital) may require surgery. MEDICATION  If pain medication is necessary, nonsteroidal anti-inflammatory medications, such as aspirin and ibuprofen, or other minor pain  relievers, such as acetaminophen, are often recommended.  Do not take pain medication for 7 days before surgery.  Prescription pain relievers may be given if deemed necessary by your caregiver. Use only as directed and only as much as you need. HEAT AND COLD:   Cold treatment (icing) relieves pain and reduces inflammation. Cold treatment should be applied for 10 to 15 minutes every 2 to 3 hours for inflammation and pain and immediately after any activity that aggravates your symptoms. Use ice packs or an ice massage.  Heat treatment may be used prior to performing the stretching and strengthening activities prescribed by your caregiver, physical therapist, or athletic trainer. Use a heat pack or a warm soak. SEEK MEDICAL CARE IF:   Symptoms get worse or do not improve in 2 weeks despite treatment.  New, unexplained symptoms develop (drugs used in treatment may produce side effects). EXERCISES RANGE OF MOTION (ROM) AND STRETCHING EXERCISES - Cervical Strain and Sprain These exercises may help you when beginning to rehabilitate your injury. In order to successfully resolve your symptoms, you must improve your posture. These exercises are designed to help reduce the forward-head and rounded-shoulder posture which contributes to this condition. Your symptoms may resolve with or without further involvement from your physician, physical therapist or athletic trainer. While completing these exercises, remember:   Restoring tissue flexibility helps normal motion to return to the joints. This allows healthier, less painful movement and activity.  An effective stretch should be held for at least 20 seconds, although you may need to begin with shorter hold times for comfort.  A stretch should never be painful. You should only feel a gentle lengthening or release in the stretched tissue. STRETCH- Axial Extensors  Lie on your back on the floor. You may bend your knees for comfort. Place a rolled-up hand  towel or dish towel, about 2 inches in diameter, under the part of your head that makes contact with the floor.  Gently tuck your chin, as if trying to make a "double chin," until you feel a gentle stretch at the base of your head.  Hold __________ seconds. Repeat __________ times. Complete this exercise __________ times per day.  STRETCH - Axial Extension   Stand or sit on a firm surface. Assume a good posture: chest up, shoulders drawn back, abdominal muscles slightly tense, knees unlocked (if standing) and feet hip width apart.  Slowly retract your chin so your head slides back and your chin slightly lowers. Continue to look straight ahead.  You should feel a gentle stretch in the back of your head. Be certain not to feel an aggressive stretch since this can cause headaches later.  Hold for __________ seconds. Repeat __________ times. Complete this exercise __________ times per day. STRETCH - Cervical Side Bend   Stand or sit on a firm surface. Assume a good posture: chest up, shoulders drawn back, abdominal muscles slightly tense, knees unlocked (if standing) and feet hip width apart.  Without letting your nose or shoulders move, slowly tip your right / left ear to your shoulder until your feel a gentle stretch in the muscles on the opposite side of your neck.  Hold __________ seconds. Repeat __________ times. Complete this exercise __________ times per day. STRETCH - Cervical Rotators   Stand or sit on a firm surface. Assume a good posture: chest up, shoulders drawn back, abdominal muscles slightly  tense, knees unlocked (if standing) and feet hip width apart.  Keeping your eyes level with the ground, slowly turn your head until you feel a gentle stretch along the back and opposite side of your neck.  Hold __________ seconds. Repeat __________ times. Complete this exercise __________ times per day. RANGE OF MOTION - Neck Circles   Stand or sit on a firm surface. Assume a good  posture: chest up, shoulders drawn back, abdominal muscles slightly tense, knees unlocked (if standing) and feet hip width apart.  Gently roll your head down and around from the back of one shoulder to the back of the other. The motion should never be forced or painful.  Repeat the motion 10-20 times, or until you feel the neck muscles relax and loosen. Repeat __________ times. Complete the exercise __________ times per day. STRENGTHENING EXERCISES - Cervical Strain and Sprain These exercises may help you when beginning to rehabilitate your injury. They may resolve your symptoms with or without further involvement from your physician, physical therapist, or athletic trainer. While completing these exercises, remember:   Muscles can gain both the endurance and the strength needed for everyday activities through controlled exercises.  Complete these exercises as instructed by your physician, physical therapist, or athletic trainer. Progress the resistance and repetitions only as guided.  You may experience muscle soreness or fatigue, but the pain or discomfort you are trying to eliminate should never worsen during these exercises. If this pain does worsen, stop and make certain you are following the directions exactly. If the pain is still present after adjustments, discontinue the exercise until you can discuss the trouble with your clinician. STRENGTH - Cervical Flexors, Isometric  Face a wall, standing about 6 inches away. Place a small pillow, a ball about 6-8 inches in diameter, or a folded towel between your forehead and the wall.  Slightly tuck your chin and gently push your forehead into the soft object. Push only with mild to moderate intensity, building up tension gradually. Keep your jaw and forehead relaxed.  Hold 10 to 20 seconds. Keep your breathing relaxed.  Release the tension slowly. Relax your neck muscles completely before you start the next repetition. Repeat __________  times. Complete this exercise __________ times per day. STRENGTH- Cervical Lateral Flexors, Isometric   Stand about 6 inches away from a wall. Place a small pillow, a ball about 6-8 inches in diameter, or a folded towel between the side of your head and the wall.  Slightly tuck your chin and gently tilt your head into the soft object. Push only with mild to moderate intensity, building up tension gradually. Keep your jaw and forehead relaxed.  Hold 10 to 20 seconds. Keep your breathing relaxed.  Release the tension slowly. Relax your neck muscles completely before you start the next repetition. Repeat __________ times. Complete this exercise __________ times per day. STRENGTH - Cervical Extensors, Isometric   Stand about 6 inches away from a wall. Place a small pillow, a ball about 6-8 inches in diameter, or a folded towel between the back of your head and the wall.  Slightly tuck your chin and gently tilt your head back into the soft object. Push only with mild to moderate intensity, building up tension gradually. Keep your jaw and forehead relaxed.  Hold 10 to 20 seconds. Keep your breathing relaxed.  Release the tension slowly. Relax your neck muscles completely before you start the next repetition. Repeat __________ times. Complete this exercise __________ times per day. POSTURE  AND BODY MECHANICS CONSIDERATIONS - Cervical Strain and Sprain Keeping correct posture when sitting, standing or completing your activities will reduce the stress put on different body tissues, allowing injured tissues a chance to heal and limiting painful experiences. The following are general guidelines for improved posture. Your physician or physical therapist will provide you with any instructions specific to your needs. While reading these guidelines, remember:  The exercises prescribed by your provider will help you have the flexibility and strength to maintain correct postures.  The correct posture  provides the optimal environment for your joints to work. All of your joints have less wear and tear when properly supported by a spine with good posture. This means you will experience a healthier, less painful body.  Correct posture must be practiced with all of your activities, especially prolonged sitting and standing. Correct posture is as important when doing repetitive low-stress activities (typing) as it is when doing a single heavy-load activity (lifting). PROLONGED STANDING WHILE SLIGHTLY LEANING FORWARD When completing a task that requires you to lean forward while standing in one place for a long time, place either foot up on a stationary 2- to 4-inch high object to help maintain the best posture. When both feet are on the ground, the low back tends to lose its slight inward curve. If this curve flattens (or becomes too large), then the back and your other joints will experience too much stress, fatigue more quickly, and can cause pain.  RESTING POSITIONS Consider which positions are most painful for you when choosing a resting position. If you have pain with flexion-based activities (sitting, bending, stooping, squatting), choose a position that allows you to rest in a less flexed posture. You would want to avoid curling into a fetal position on your side. If your pain worsens with extension-based activities (prolonged standing, working overhead), avoid resting in an extended position such as sleeping on your stomach. Most people will find more comfort when they rest with their spine in a more neutral position, neither too rounded nor too arched. Lying on a non-sagging bed on your side with a pillow between your knees, or on your back with a pillow under your knees will often provide some relief. Keep in mind, being in any one position for a prolonged period of time, no matter how correct your posture, can still lead to stiffness. WALKING Walk with an upright posture. Your ears, shoulders, and  hips should all line up. OFFICE WORK When working at a desk, create an environment that supports good, upright posture. Without extra support, muscles fatigue and lead to excessive strain on joints and other tissues. CHAIR:  A chair should be able to slide under your desk when your back makes contact with the back of the chair. This allows you to work closely.  The chair's height should allow your eyes to be level with the upper part of your monitor and your hands to be slightly lower than your elbows.  Body position:  Your feet should make contact with the floor. If this is not possible, use a foot rest.  Keep your ears over your shoulders. This will reduce stress on your neck and low back.   This information is not intended to replace advice given to you by your health care provider. Make sure you discuss any questions you have with your health care provider.   Document Released: 07/09/2005 Document Revised: 07/30/2014 Document Reviewed: 10/21/2008 Elsevier Interactive Patient Education [employer].

## 2016-03-01 NOTE — Progress Notes (Signed)
Subjective:    Patient ID: [name] [name], female    DOB: [birth year], 75 y.o.   MRN: [id number]  Chief Complaint  Patient presents with  . Neck Pain    neck pain hurts to turn her head either way, has issue with balance, having sharp pains in neck, x3 days    HPI:  [name] [name] is a 75 y.o. female who  has a past medical history of Allergy; Arthritis; Bell's palsy; Blood transfusion; Blood transfusion without reported diagnosis; Cataract; Colon polyps; Complication of anesthesia; Crohn's colitis ([place]); Dry eyes; GERD (gastroesophageal reflux disease); Headache(784.0); Hypertension; and Shortness of breath. and presents today for an acute office visit.   This is a new problem. Associated symptom of pain located in her cervical spine has been going on for about 3 days. Aggravated with neck movements. No trauma or injury. Pain is drescribed as sharp. Modifying factors include tramadol which has not helped very much.   Allergies  Allergen Reactions  . Azathioprine Nausea Only    Elevated lipase also - ? Pancreatitis   . Entyvio [Vedolizumab] Shortness Of Breath  . Adhesive [Tape] Rash    Taking skin off  . Sulfa Antibiotics Itching and Rash     Current Outpatient Prescriptions on File Prior to Visit  Medication Sig Dispense Refill  . Adalimumab (HUMIRA) 40 MG/0.8ML PSKT Inject into the skin. Inj. Every 2 weeks.    Marland Kitchen amLODipine (NORVASC) 5 MG tablet TAKE 1 TABLET (5 MG TOTAL) BY MOUTH AT BEDTIME. 90 tablet 3  . b complex vitamins capsule Take 1 capsule by mouth daily.      Marland Kitchen BYSTOLIC 10 MG tablet Take 1 tablet (10 mg total) by mouth daily. 90 tablet 3  . Calcium Carbonate-Vit D-Min (CALCIUM 1200) 1200-1000 MG-UNIT CHEW Take 1 chew by mouth once daily 1 each 0  . carboxymethylcellulose (REFRESH PLUS) 0.5 % SOLN Place 1 drop into both eyes 3 (three) times daily as needed (dry eyes).     . cetirizine (ZYRTEC) 10 MG tablet Take 10 mg by mouth daily as needed for allergies.     .  Cholecalciferol (VITAMIN D) 2000 UNITS CAPS Take 1 capsule (2,000 Units total) by mouth daily. 1 capsule 0  . diclofenac sodium (VOLTAREN) 1 % GEL Apply 4 g topically 4 (four) times daily. 1 Tube 3  . dicyclomine (BENTYL) 20 MG tablet TAKE 1 TABLET (20 MG TOTAL) BY MOUTH 3 (THREE) TIMES DAILY. FOR STOMACH PAIN 90 tablet 11  . diphenoxylate-atropine (LOMOTIL) 2.5-0.025 MG tablet TAKE 1 TO 2 TABLETS BY MOUTH EVERY 4 HOURS AS NEEDED FOR DIARRHEA 90 tablet 1  . famotidine (PEPCID) 20 MG tablet Take 20 mg by mouth 2 (two) times daily.    . hydrochlorothiazide (HYDRODIURIL) 25 MG tablet Take 1 tablet (25 mg total) by mouth daily. 90 tablet 3  . Multiple Vitamins-Minerals (MULTIVITAMINS THER. W/MINERALS) TABS Take 1 tablet by mouth daily.      . ondansetron (ZOFRAN) 4 MG tablet Take 1 tablet (4 mg total) by mouth every 8 (eight) hours as needed for nausea or vomiting. 30 tablet 1  . pantoprazole (PROTONIX) 40 MG tablet Take 1 tablet (40 mg total) by mouth daily. 90 tablet 3  . predniSONE (DELTASONE) 10 MG tablet Take 0.5 tablets (5 mg total) by mouth daily with breakfast. In one month take every other day and then in 1 month stop 100 tablet 0  . traMADol (ULTRAM) 50 MG tablet Take 1 tablet (50 mg  total) by mouth every 8 (eight) hours as needed. 60 tablet 2   No current facility-administered medications on file prior to visit.     Past Medical History:  Diagnosis Date  . Allergy    year around allergies   . Arthritis    shoulder  . Bell's palsy   . Blood transfusion    1966  . Blood transfusion without reported diagnosis   . Cataract    bilaterally removed  . Colon polyps    adenomatous polyp June 2012  . Complication of anesthesia    slow to wake  . Crohn's colitis ([place])   . Dry eyes    left eye worst one  . GERD (gastroesophageal reflux disease)   . Headache(784.0)    hx migraines  . Hypertension   . Shortness of breath      Past Surgical History:  Procedure Laterality Date  .  ABDOMINAL HYSTERECTOMY    . APPENDECTOMY    . CATARACT EXTRACTION Bilateral    Separate dates  . CHOLECYSTECTOMY    . COLONOSCOPY  multiple  . flex sigmoidoscopy with biospy  01/17/12  . FLEXIBLE SIGMOIDOSCOPY  06/08/2011   severe Crohn's colitis to descending colon at least  . FOOT GANGLION EXCISION     left  . SHOULDER SURGERY     right  . TONSILLECTOMY       Review of Systems  Constitutional: Negative for chills and fever.  Musculoskeletal: Positive for neck pain.      Objective:    BP (!) 168/92 (BP Location: Left Arm, Patient Position: Sitting, Cuff Size: Normal)   Pulse 65   Temp 98.9 F (37.2 C) (Oral)   Resp 16   Ht 5' 6"  (1.676 m)   Wt 175 lb (79.4 kg)   SpO2 96%   BMI 28.25 kg/m  Nursing note and vital signs reviewed.  Physical Exam  Constitutional: She is oriented to person, place, and time. She appears well-developed and well-nourished. No distress.  Neck:  No obvious deformity, discoloration, or edema. There is tenderness without crepitus or deformity noted on the bilateral upper trapezius muscle groups. Range of motion is significantly decreased secondary to discomfort and spasm. Negative cervical compression test. Negative Spurling's.  Cardiovascular: Normal rate, regular rhythm, normal heart sounds and intact distal pulses.   Pulmonary/Chest: Effort normal and breath sounds normal.  Neurological: She is alert and oriented to person, place, and time.  Skin: Skin is warm and dry.  Psychiatric: She has a normal mood and affect. Her behavior is normal. Judgment and thought content normal.       Assessment & Plan:   Problem List Items Addressed This Visit      Other   Cervical paraspinal muscle spasm    Symptoms and exam consistent with cervical paraspinal muscle spasms unrelated to trauma or injury. Treat conservatively with ice/heat, home exercise therapy, and start cyclobenzaprine as needed for muscle spasms. Follow-up if symptoms worsen or do not  improve.      Relevant Medications   cyclobenzaprine (FLEXERIL) 10 MG tablet    Other Visit Diagnoses   None.      I am having [name] start on cyclobenzaprine. I am also having her maintain her cetirizine, b complex vitamins, multivitamins ther. w/minerals, CALCIUM 1200, Vitamin D, carboxymethylcellulose, pantoprazole, BYSTOLIC, hydrochlorothiazide, famotidine, ondansetron, diphenoxylate-atropine, traMADol, dicyclomine, Adalimumab, amLODipine, diclofenac sodium, and predniSONE.   Meds ordered this encounter  Medications  . cyclobenzaprine (FLEXERIL) 10 MG tablet    Sig:  Take 0.5-1 tablets (5-10 mg total) by mouth 3 (three) times daily as needed for muscle spasms.    Dispense:  40 tablet    Refill:  0    Order Specific Question:   Supervising Provider    Answer:   Pricilla Holm A [[id number]]     Follow-up: Return if symptoms worsen or fail to improve.  Mauricio Po, FNP

## 2016-03-02 ENCOUNTER — Other Ambulatory Visit: Payer: Self-pay | Admitting: Internal Medicine

## 2016-03-02 NOTE — Telephone Encounter (Signed)
Faxed to pharmacy

## 2016-03-05 ENCOUNTER — Encounter: Payer: Self-pay | Admitting: Internal Medicine

## 2016-03-05 ENCOUNTER — Ambulatory Visit (INDEPENDENT_AMBULATORY_CARE_PROVIDER_SITE_OTHER): Payer: Medicare Other | Admitting: Internal Medicine

## 2016-03-05 ENCOUNTER — Ambulatory Visit (INDEPENDENT_AMBULATORY_CARE_PROVIDER_SITE_OTHER)
Admission: RE | Admit: 2016-03-05 | Discharge: 2016-03-05 | Disposition: A | Discharge status: Home / Self Care | Payer: Medicare Other | Source: Ambulatory Visit | Attending: Internal Medicine | Admitting: Internal Medicine

## 2016-03-05 VITALS — BP 140/90 | HR 62 | Temp 99.0°F | Resp 16 | Ht 66.0 in | Wt 174.4 lb

## 2016-03-05 DIAGNOSIS — K5010 Crohn's disease of large intestine without complications: Secondary | ICD-10-CM

## 2016-03-05 DIAGNOSIS — M542 Cervicalgia: Secondary | ICD-10-CM

## 2016-03-05 NOTE — Assessment & Plan Note (Signed)
Given her prednisone therapy long term she needs x-ray to rule out fracture. She did not seek care after the fall. Advised to stop taking flexeril as this is a new medication and seemed to cause problems with her. Can use tramadol for pain and heating pad and voltaren gel.

## 2016-03-05 NOTE — Progress Notes (Signed)
Pre visit review using our clinic review tool, if applicable. No additional management support is needed unless otherwise documented below in the visit note. 

## 2016-03-05 NOTE — Progress Notes (Signed)
   Subjective:    Patient ID: Woodland, female    DOB: [birth year], 75 y.o.   MRN: [id number]  HPI The patient is a 75 YO female coming in for fall and injury to her head and back. She is having pain in her back still. Fell 1 day ago (her son states, but she thinks it was two days ago) by taking flexeril and tramadol together which caused her to be drunk feeling. She took 7 of the pills over the weekend. She denies taking this much. She was walking into her house and hit against the door. She states her dogs were what caused her to fall but her son disagrees. Denies LOC or nausea or vomiting since that time. She is on chronic prednisone due to her crohn's disease. Does not have any more dizziness or drunk feeling since this weekend.   Review of Systems  Constitutional: Positive for activity change and fatigue. Negative for appetite change, chills, fever and unexpected weight change.  Respiratory: Negative for cough, chest tightness, shortness of breath and wheezing.   Cardiovascular: Negative for chest pain, palpitations and leg swelling.  Gastrointestinal: Negative for abdominal distention, abdominal pain, blood in stool, constipation, diarrhea and nausea.  Musculoskeletal: Positive for arthralgias, back pain and gait problem. Negative for myalgias.  Skin: Negative.   Neurological: Positive for dizziness and headaches. Negative for seizures, syncope, facial asymmetry, weakness, light-headedness and numbness.  Psychiatric/Behavioral: Negative.       Objective:   Physical Exam  Constitutional: She is oriented to person, place, and time. She appears well-developed and well-nourished.  HENT:  Head: Normocephalic and atraumatic.  Eyes: EOM are normal.  Neck: Normal range of motion.  Cardiovascular: Normal rate and regular rhythm.   Pulmonary/Chest: Effort normal. No respiratory distress. She has no wheezes. She has no rales.  Abdominal: Soft. Bowel sounds are normal. She exhibits no  distension. There is no tenderness. There is no rebound.  Musculoskeletal: She exhibits tenderness. She exhibits no edema.  Pain on the parietal left region as well as the cervical midline and paraspinal and into the right and left shoulder.   Neurological: She is alert and oriented to person, place, and time. Coordination normal.  Skin: Skin is warm and dry.  Jagged skin tear 7-8 cm long with scabbing left forearm superficial   Vitals:   03/05/16 1603  BP: 140/90  Pulse: 62  Resp: 16  Temp: 99 F (37.2 C)  TempSrc: Oral  SpO2: 92%  Weight: 174 lb 6.4 oz (79.1 kg)  Height: 5' 6"  (1.676 m)      Assessment & Plan:

## 2016-03-05 NOTE — Patient Instructions (Addendum)
We are going to check an x-ray of the upper back and neck today to make sure there are no fractures.   It is okay to use the heating pad on the neck and shoulders which hurt.   I would recommend not to take the flexeril and tramadol together since they seemed to bother you this weekend. It is okay to use the tramadol for the pain.   I would recommend to not do too much activity. Walking is okay but I recommend not to lift more than 5 pounds for the next 2 weeks.

## 2016-03-06 ENCOUNTER — Other Ambulatory Visit: Payer: Self-pay | Admitting: Internal Medicine

## 2016-03-06 NOTE — Assessment & Plan Note (Signed)
She is on long term prednisone for this disease which makes her much more likely to have fracture with fall.

## 2016-03-16 ENCOUNTER — Telehealth: Payer: Self-pay | Admitting: Internal Medicine

## 2016-03-16 NOTE — Telephone Encounter (Signed)
Discussed with Dr Carlean Purl the pt has started to have some rectal bleeding again since last week after a fall.   She states she stopped prednisone in September but should be on 5 mg prednisone daily until appt follow up.  She will call back if she does not improve.   She will call back to set November appt up in a few weeks.  The schedule is not out that far yet.

## 2016-03-19 ENCOUNTER — Other Ambulatory Visit (INDEPENDENT_AMBULATORY_CARE_PROVIDER_SITE_OTHER): Payer: Medicare Other

## 2016-03-19 ENCOUNTER — Ambulatory Visit (INDEPENDENT_AMBULATORY_CARE_PROVIDER_SITE_OTHER): Payer: Medicare Other | Admitting: Internal Medicine

## 2016-03-19 ENCOUNTER — Telehealth: Payer: Self-pay | Admitting: Internal Medicine

## 2016-03-19 ENCOUNTER — Encounter: Payer: Self-pay | Admitting: Internal Medicine

## 2016-03-19 VITALS — BP 146/78 | HR 68 | Ht 66.0 in | Wt 169.2 lb

## 2016-03-19 DIAGNOSIS — K50111 Crohn's disease of large intestine with rectal bleeding: Secondary | ICD-10-CM

## 2016-03-19 DIAGNOSIS — K5090 Crohn's disease, unspecified, without complications: Secondary | ICD-10-CM | POA: Diagnosis not present

## 2016-03-19 DIAGNOSIS — M0760 Enteropathic arthropathies, unspecified site: Secondary | ICD-10-CM

## 2016-03-19 DIAGNOSIS — R197 Diarrhea, unspecified: Secondary | ICD-10-CM

## 2016-03-19 LAB — COMPREHENSIVE METABOLIC PANEL
ALK PHOS: 115 U/L (ref 39–117)
ALT: 18 U/L (ref 0–35)
AST: 21 U/L (ref 0–37)
Albumin: 3.6 g/dL (ref 3.5–5.2)
BILIRUBIN TOTAL: 0.2 mg/dL (ref 0.2–1.2)
BUN: 15 mg/dL (ref 6–23)
CALCIUM: 9.6 mg/dL (ref 8.4–10.5)
CO2: 31 mEq/L (ref 19–32)
Chloride: 97 mEq/L (ref 96–112)
Creatinine, Ser: 1.22 mg/dL — ABNORMAL HIGH (ref 0.40–1.20)
GFR: 45.74 mL/min — AB (ref >60.00)
Glucose, Bld: 113 mg/dL — ABNORMAL HIGH (ref 70–99)
Potassium: 4.6 mEq/L (ref 3.5–5.1)
Sodium: 135 mEq/L (ref 135–145)
TOTAL PROTEIN: 8.1 g/dL (ref 6.0–8.3)

## 2016-03-19 LAB — CBC WITH DIFFERENTIAL/PLATELET
BASOS ABS: 0.1 10*3/uL (ref 0.0–0.1)
Basophils Relative: 0.3 % (ref 0.0–3.0)
Eosinophils Absolute: 0 10*3/uL (ref 0.0–0.7)
Eosinophils Relative: 0 % (ref 0.0–5.0)
HCT: 43.4 % (ref 36.0–46.0)
Hemoglobin: 14.5 g/dL (ref 12.0–15.0)
LYMPHS PCT: 11.1 % — AB (ref 12.0–46.0)
Lymphs Abs: 1.9 10*3/uL (ref 0.7–4.0)
MCHC: 33.3 g/dL (ref 30.0–36.0)
MCV: 88.6 fl (ref 78.0–100.0)
MONOS PCT: 6.5 % (ref 3.0–12.0)
Monocytes Absolute: 1.1 10*3/uL — ABNORMAL HIGH (ref 0.1–1.0)
NEUTROS PCT: 82.1 % — AB (ref 43.0–77.0)
Neutro Abs: 13.8 10*3/uL — ABNORMAL HIGH (ref 1.4–7.7)
RBC: 4.9 Mil/uL (ref 3.87–5.11)
RDW: 14.5 % (ref 11.5–15.5)
WBC: 16.9 10*3/uL — ABNORMAL HIGH (ref 4.0–10.5)

## 2016-03-19 LAB — C-REACTIVE PROTEIN: CRP: 2.9 mg/dL (ref 0.5–20.0)

## 2016-03-19 LAB — SEDIMENTATION RATE: Sed Rate: 46 mm/hr — ABNORMAL HIGH (ref 0–30)

## 2016-03-19 NOTE — Telephone Encounter (Signed)
Patient with worsening rectal bleeding, unbalanced, and worsening joint swelling, and fatigue.  She will come in today and see Dr. Carlean Purl at 3:45

## 2016-03-19 NOTE — Patient Instructions (Addendum)
  Your physician has requested that you go to the basement for the lab work before leaving today.   Go to 55m of Prednisone daily.   Call [place] an update in a week.    We are going to refer you to Dr WHurley Ciscofor your arthritis.  I will contact you about this.    We will see you back October 16 th at 11:15AM    I appreciate the opportunity to care for you. CSilvano Rusk MD, [facility]

## 2016-03-19 NOTE — Progress Notes (Signed)
Subjective:    Patient ID: [name] [name], female    DOB: 1940-09-03, 75 y.o.   MRN: [id number] Chief complaint: Diarrhea, point swelling rectal bleeding HPI A she is here with her daughter [name] [name] dissipates in the history some. In the past week she developed diarrhea and crampy abdominal pain again as we have tapered her prednisone she's been on 5 mg. She started to have rectal bleeding, call on Friday 2-3 days ago and I told her to go to 20 mg of prednisone but things of either stable or same are worsened over the weekend. Additionally since July she's had intermittent swelling of her joints particularly her wrists and hands and the metacarpal phalangeal joint area. Neither any fever or rashes. She does have easy bruising or purpura since prednisone. No recent antibiotics. Last Humira was one week ago.  Allergies  Allergen Reactions  . Azathioprine Nausea Only    Elevated lipase also - ? Pancreatitis   . Entyvio [Vedolizumab] Shortness Of Breath  . Adhesive [Tape] Rash    Taking skin off  . Sulfa Antibiotics Itching and Rash   Outpatient Medications Prior to Visit  Medication Sig Dispense Refill  . Adalimumab (HUMIRA) 40 MG/0.8ML PSKT Inject into the skin. Inj. Every 2 weeks.    Marland Kitchen amLODipine (NORVASC) 5 MG tablet TAKE 1 TABLET (5 MG TOTAL) BY MOUTH AT BEDTIME. 90 tablet 3  . b complex vitamins capsule Take 1 capsule by mouth daily.      Marland Kitchen BYSTOLIC 10 MG tablet Take 1 tablet (10 mg total) by mouth daily. 90 tablet 3  . carboxymethylcellulose (REFRESH PLUS) 0.5 % SOLN Place 1 drop into both eyes 3 (three) times daily as needed (dry eyes).     . cetirizine (ZYRTEC) 10 MG tablet Take 10 mg by mouth daily as needed for allergies.     Marland Kitchen diclofenac sodium (VOLTAREN) 1 % GEL Apply 4 g topically 4 (four) times daily. 1 Tube 3  . dicyclomine (BENTYL) 20 MG tablet TAKE 1 TABLET (20 MG TOTAL) BY MOUTH 3 (THREE) TIMES DAILY. FOR STOMACH PAIN 90 tablet 11  . diphenoxylate-atropine (LOMOTIL)  2.5-0.025 MG tablet TAKE 1 TO 2 TABLETS BY MOUTH EVERY 4 HOURS AS NEEDED FOR DIARRHEA 90 tablet 1  . famotidine (PEPCID) 20 MG tablet Take 20 mg by mouth 2 (two) times daily.    . hydrochlorothiazide (HYDRODIURIL) 25 MG tablet TAKE 1 TABLET (25 MG TOTAL) BY MOUTH DAILY. 90 tablet 3  . Multiple Vitamins-Minerals (MULTIVITAMINS THER. W/MINERALS) TABS Take 1 tablet by mouth daily.      . ondansetron (ZOFRAN) 4 MG tablet Take 1 tablet (4 mg total) by mouth every 8 (eight) hours as needed for nausea or vomiting. 30 tablet 1  . pantoprazole (PROTONIX) 40 MG tablet Take 1 tablet (40 mg total) by mouth daily. 90 tablet 3  . predniSONE (DELTASONE) 10 MG tablet Take 0.5 tablets (5 mg total) by mouth daily with breakfast. In one month take every other day and then in 1 month stop 100 tablet 0  . traMADol (ULTRAM) 50 MG tablet TAKE 1 TABLET BY MOUTH EVERY 8 HOURS AS NEEDED 60 tablet 2  . Calcium Carbonate-Vit D-Min (CALCIUM 1200) 1200-1000 MG-UNIT CHEW Take 1 chew by mouth once daily 1 each 0  . Cholecalciferol (VITAMIN D) 2000 UNITS CAPS Take 1 capsule (2,000 Units total) by mouth daily. 1 capsule 0  . cyclobenzaprine (FLEXERIL) 10 MG tablet Take 0.5-1 tablets (5-10 mg total) by mouth 3 (three)  times daily as needed for muscle spasms. 40 tablet 0   No facility-administered medications prior to visit.    Past Medical History:  Diagnosis Date  . Allergy    year around allergies   . Arthritis    shoulder  . Bell's palsy   . Blood transfusion    1966  . Blood transfusion without reported diagnosis   . Cataract    bilaterally removed  . Colon polyps    adenomatous polyp June 2012  . Complication of anesthesia    slow to wake  . Crohn's colitis ([place])   . Dry eyes    left eye worst one  . GERD (gastroesophageal reflux disease)   . Headache(784.0)    hx migraines  . Hypertension   . Shortness of breath    Past Surgical History:  Procedure Laterality Date  . ABDOMINAL HYSTERECTOMY    .  APPENDECTOMY    . CATARACT EXTRACTION Bilateral    Separate dates  . CHOLECYSTECTOMY    . COLONOSCOPY  multiple  . flex sigmoidoscopy with biospy  01/17/12  . FLEXIBLE SIGMOIDOSCOPY  06/08/2011   severe Crohn's colitis to descending colon at least  . FOOT GANGLION EXCISION     left  . SHOULDER SURGERY     right  . TONSILLECTOMY     Social History   Social History  . Marital status: Widowed    Spouse name: N/A  . Number of children: 3  . Years of education: N/A   Occupational History  . retired  Retired   Social History Main Topics  . Smoking status: Former Smoker    Packs/day: 0.25    Years: 10.00    Types: Cigarettes    Quit date: 09/26/1968  . Smokeless tobacco: Never Used  . Alcohol use No     Comment: rare wine  . Drug use: No  . Sexual activity: No   Other Topics Concern  . None   Social History Narrative  . None   Family History  Problem Relation Age of Onset  . Leukemia Mother   . Heart disease Mother   . Heart disease Father   . Heart attack Father   . Ovarian cancer Paternal Aunt     1/2 aunt  . Breast cancer Paternal Aunt     1/2 aunt  . Throat cancer Paternal Aunt     1/2 aunt  . Parkinsonism Brother   . Heart disease Brother   . Leukemia Maternal Grandfather   . Anesthesia problems Neg Hx   . Hypotension Neg Hx   . Malignant hyperthermia Neg Hx   . Pseudochol deficiency Neg Hx   . Colon cancer Neg Hx   . Diabetes Neg Hx   . Esophageal cancer Neg Hx   . Rectal cancer Neg Hx   . Stomach cancer Neg Hx        Review of Systems As above, she also has been having some epidural injections into her back, she had neck pains and muscle spasms and was prescribed cyclobenzaprine and then get dizzy and woozy and fell and hit her head without significant injury. The spine films 03/05/2016 showed degenerative changes without acute abnormality. Objective:   Physical Exam  NAD Lungs CTA Cor S1S2 no rmg abd soft and mildly diffusely tender Ext no  edema musc - inflammatory changes thickening MCP joints especially # 2 and ulnar wrist areas, decent ROM  Data reviewed as above. PCP notes from August 2017 and cervical spine  film report.     Assessment & Plan:   Encounter Diagnoses  Name Primary?  . Crohn's colitis, with rectal bleeding ([place]) Yes  . 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 related   . Diarrhea, unspecified type    She is flaring it seems. Another possibility would be C. difficile though I favor a flare of her Crohn's colitis. Unfortunately she never really been able to get off prednisone in the matter what it seems like when we get down below 10 mg at some point she flares. She has been on Humira since April. I have some question if the joint symptoms could be related to that but not enough to stop it at this point.  Plans:  40 mg prednisone daily and she will give me an update in one week regarding tapering this We'll refer to Dr. Charlestine Night of rheumatology for evaluation of her arthritis problems. Humira Abs be checked. CBC, CRP, ESR, CMET, ANA  C diff PCR   see me in mid October sooner if needed.  communicate by phone in the meantime  I appreciate the opportunity to care for this patient. CC: Hoyt Koch, MD Dr. Hurley Cisco

## 2016-03-20 ENCOUNTER — Other Ambulatory Visit: Payer: Medicare Other

## 2016-03-20 ENCOUNTER — Other Ambulatory Visit: Payer: Self-pay | Admitting: Internal Medicine

## 2016-03-20 DIAGNOSIS — R197 Diarrhea, unspecified: Secondary | ICD-10-CM | POA: Diagnosis not present

## 2016-03-20 LAB — ANA: Anti Nuclear Antibody(ANA): NEGATIVE

## 2016-03-21 LAB — CLOSTRIDIUM DIFFICILE BY PCR: CDIFFPCR: NOT DETECTED

## 2016-03-21 NOTE — Progress Notes (Signed)
Does not have C diff  Other labs were ok  Stick w/ plan

## 2016-03-26 LAB — SERIAL MONITORING

## 2016-03-27 ENCOUNTER — Telehealth: Payer: Self-pay | Admitting: Internal Medicine

## 2016-03-27 LAB — ADALIMUMAB+AB (SERIAL MONITOR)
Adalimumab Drug Level: <0.6 ug/mL
Anti-Adalimumab Antibody: 150278 ng/mL

## 2016-03-27 NOTE — Telephone Encounter (Signed)
Patient called to report that she had vomiting yesterday in the afternoon.  She does report that she feels better today.  She notes eating [employer] on Sunday, not sure if that was it.  She does not want to continue Humira.  She wanted to let you know. She is happier on prednisone.  She is doing better with arthralgias on prednisone.  She feels they are related to Humira

## 2016-03-28 MED ORDER — PREDNISONE 10 MG PO TABS: ORAL_TABLET | ORAL | 1 refills | Status: DC

## 2016-03-28 NOTE — Telephone Encounter (Signed)
OK - DC Humira Ab levels were sky high so its likely lost its effectiveness also  Change prednisone to 30 mg daily x 2 weeks then 20 mg qd x 2 weeks then 15 mg qd as long as things going ok and will go from there when she sees me in Oct

## 2016-04-04 DIAGNOSIS — M25441 Effusion, right hand: Secondary | ICD-10-CM | POA: Diagnosis not present

## 2016-04-04 DIAGNOSIS — Z79899 Other long term (current) drug therapy: Secondary | ICD-10-CM | POA: Diagnosis not present

## 2016-04-04 DIAGNOSIS — M545 Low back pain: Secondary | ICD-10-CM | POA: Diagnosis not present

## 2016-04-04 DIAGNOSIS — M25432 Effusion, left wrist: Secondary | ICD-10-CM | POA: Diagnosis not present

## 2016-04-04 DIAGNOSIS — M130 Polyarthritis, unspecified: Secondary | ICD-10-CM | POA: Diagnosis not present

## 2016-04-04 DIAGNOSIS — K5090 Crohn's disease, unspecified, without complications: Secondary | ICD-10-CM | POA: Diagnosis not present

## 2016-04-04 DIAGNOSIS — M542 Cervicalgia: Secondary | ICD-10-CM | POA: Diagnosis not present

## 2016-04-25 DIAGNOSIS — Z23 Encounter for immunization: Secondary | ICD-10-CM | POA: Diagnosis not present

## 2016-05-01 ENCOUNTER — Other Ambulatory Visit: Payer: Self-pay | Admitting: Internal Medicine

## 2016-05-01 NOTE — Telephone Encounter (Signed)
Faxed to pharmacy

## 2016-05-03 ENCOUNTER — Telehealth: Payer: Self-pay | Admitting: Internal Medicine

## 2016-05-03 NOTE — Telephone Encounter (Signed)
Patient notified

## 2016-05-03 NOTE — Telephone Encounter (Signed)
Should be ok

## 2016-05-03 NOTE — Telephone Encounter (Signed)
Dr. Carlean Purl ok for her to be a little late at 11:30 on Monday 10/16?

## 2016-05-07 ENCOUNTER — Other Ambulatory Visit: Payer: Self-pay | Admitting: *Deleted

## 2016-05-07 ENCOUNTER — Ambulatory Visit (INDEPENDENT_AMBULATORY_CARE_PROVIDER_SITE_OTHER): Payer: Medicare Other | Admitting: Internal Medicine

## 2016-05-07 ENCOUNTER — Encounter: Payer: Self-pay | Admitting: Internal Medicine

## 2016-05-07 VITALS — BP 126/70 | HR 64 | Ht 65.5 in | Wt 177.4 lb

## 2016-05-07 DIAGNOSIS — K50111 Crohn's disease of large intestine with rectal bleeding: Secondary | ICD-10-CM | POA: Diagnosis not present

## 2016-05-07 MED ORDER — PREDNISONE 10 MG PO TABS: 10.0000 mg | ORAL_TABLET | Freq: Every day | ORAL | 1 refills | Status: DC

## 2016-05-07 MED ORDER — BYSTOLIC 10 MG PO TABS: 10.0000 mg | ORAL_TABLET | Freq: Every day | ORAL | 2 refills | Status: DC

## 2016-05-07 NOTE — Patient Instructions (Signed)
   Go to 10 mg daily on the prednisone and see me in 4-6 months.  Call me sooner if needed.  I appreciate the opportunity to care for you. [name], MD, [name]

## 2016-05-07 NOTE — Progress Notes (Signed)
   [name] [name] 75 y.o. [birth year] [id number]  Assessment & Plan:   Encounter Diagnosis  Name Primary?  . Crohn's disease of colon with rectal bleeding ([place]) Yes   Improved At this point prednisone is Tx we are left with it seems' Will reduce to 10 mg daily and see me in 4-6 mos In past always flared when went below 10 mg qd  Cc;Elizabeth Becky [place], MD   Subjective:   Chief Complaint: f/u Crohn's  HPI Had diarrhea and rectal bleeding despite Humira and had abs + Dced Better again - no pain, diarrhea, bleeding - prednisone at 15 mg qd  Medications, allergies, past medical history, past surgical history, family history and social history are reviewed and updated in the EMR.  Review of Systems As above  Objective:   Physical Exam BP 126/70 (BP Location: Left Arm, Patient Position: Sitting, Cuff Size: Normal)   Pulse 64   Ht 5' 5.5" (1.664 m)   Wt 177 lb 6 oz (80.5 kg)   BMI 29.07 kg/m  NAD abd soft and nontender

## 2016-05-08 DIAGNOSIS — M109 Gout, unspecified: Secondary | ICD-10-CM | POA: Diagnosis not present

## 2016-05-08 DIAGNOSIS — M79641 Pain in right hand: Secondary | ICD-10-CM | POA: Diagnosis not present

## 2016-05-08 DIAGNOSIS — Z79899 Other long term (current) drug therapy: Secondary | ICD-10-CM | POA: Diagnosis not present

## 2016-05-15 ENCOUNTER — Telehealth (INDEPENDENT_AMBULATORY_CARE_PROVIDER_SITE_OTHER): Payer: Self-pay | Admitting: Physical Medicine and Rehabilitation

## 2016-05-15 NOTE — Telephone Encounter (Signed)
Patient is scheduled for 05/22/16 at 215 with driver and no blood thinners.

## 2016-05-15 NOTE — Telephone Encounter (Signed)
She really needs an updated MRI. But I would be willing to repeat the injection 1.

## 2016-05-22 ENCOUNTER — Encounter (INDEPENDENT_AMBULATORY_CARE_PROVIDER_SITE_OTHER): Payer: Self-pay | Admitting: Physical Medicine and Rehabilitation

## 2016-05-22 ENCOUNTER — Ambulatory Visit (INDEPENDENT_AMBULATORY_CARE_PROVIDER_SITE_OTHER): Payer: Medicare Other | Admitting: Physical Medicine and Rehabilitation

## 2016-05-22 ENCOUNTER — Encounter (INDEPENDENT_AMBULATORY_CARE_PROVIDER_SITE_OTHER): Payer: Self-pay

## 2016-05-22 VITALS — BP 155/80 | HR 61 | Temp 98.1°F

## 2016-05-22 DIAGNOSIS — M5416 Radiculopathy, lumbar region: Secondary | ICD-10-CM | POA: Diagnosis not present

## 2016-05-22 MED ORDER — LIDOCAINE HCL (PF) 1 % IJ SOLN
0.3300 mL | Freq: Once | INTRAMUSCULAR | Status: AC
Start: 2016-05-22 — End: 2016-05-22
  Administered 2016-05-22: 0.3 mL

## 2016-05-22 MED ORDER — METHYLPREDNISOLONE ACETATE 80 MG/ML IJ SUSP
80.0000 mg | Freq: Once | INTRAMUSCULAR | Status: AC
  Administered 2016-05-22: 80 mg

## 2016-05-22 NOTE — Patient Instructions (Signed)

## 2016-05-22 NOTE — Procedures (Signed)
S1 Lumbosacral Transforaminal Epidural Steroid Injection - Sub-Pedicular Approach with Fluoroscopic Guidance   Patient: [name] [name]      Date of Birth: 08-02-1940 MRN: [id number] PCP: Hoyt Koch, MD      Visit Date: 05/22/2016   Universal Protocol:    Date/Time: 11/01/176:00 AM  Consent Given By: the patient  Position:  PRONE  Additional Comments: Vital signs were monitored before and after the procedure. Patient was prepped and draped in the usual sterile fashion. The correct patient, procedure, and site was verified.   Injection Procedure Details:  Procedure Site One Meds Administered:  Meds ordered this encounter  Medications  . lidocaine (PF) (XYLOCAINE) 1 % injection 0.3 mL  . methylPREDNISolone acetate (DEPO-MEDROL) injection 80 mg     Laterality: Left  Location/Site:  S1 Foramen   Needle size: 22 G  Needle type: Spinal  Needle Placement: Transforaminal  Findings:  -Contrast Used: 2 mL iohexol 180 mg iodine/mL   -Comments: Excellent flow of contrast along the nerve and into the epidural space.  Procedure Details: After squaring off the sacral end-plate to get a true AP view, the C-arm was positioned so that the best possible view of the S1 foramen was visualized. The soft tissues overlying this structure were infiltrated with 2-3 ml. of 1% Lidocaine without Epinephrine.    The spinal needle was inserted toward the target using a "trajectory" view along the fluoroscope beam.  Under AP and lateral visualization, the needle was advanced so it did not puncture dura. Biplanar projections were used to confirm position. Aspiration was confirmed to be negative for CSF and/or blood. A 1-2 ml. volume of Isovue-250 was injected and flow of contrast was noted at each level. Radiographs were obtained for documentation purposes.   After attaining the desired flow of contrast documented above, a 0.5 to 1.0 ml test dose of 0.25% Marcaine was injected into each  respective transforaminal space.  The patient was observed for 90 seconds post injection.  After no sensory deficits were reported, and normal lower extremity motor function was noted,   the above injectate was administered so that equal amounts of the injectate were placed at each foramen (level) into the transforaminal epidural space.   Additional Comments:  The patient tolerated the procedure well Dressing: Band-Aid    Post-procedure details: Patient was observed during the procedure. Post-procedure instructions were reviewed.  Patient left the clinic in stable condition.

## 2016-05-22 NOTE — Progress Notes (Signed)
Office Visit Note  Patient: [name] [name]           Date of Birth: Mar 10, 1941           MRN: [id number] Visit Date: 05/22/2016              Requested by: Hoyt Koch, MD [place], [place] [address] PCP: Hoyt Koch, MD   Assessment & Plan: Visit Diagnoses:  1. Lumbar radiculopathy    Chronic history of low back pain with left radicular pain that's been amenable in the past to epidural injection. We completed an epidural injection sometime ago with decent relief of her leg pain but it wasn't great. He didn't last very long. We had her come in today for potentially a planned repeat of that. Unfortunately since I've seen her she's had a pretty good fall to the back side and she did land on her back. She did not see any physicians at the time. There's been no specific imaging. Her symptoms however exactly the same as down the posterior lateral leg and in the ankle and bottom of the foot it has sort of a mixed L5 and S1-type distribution. I think the best approach since she is here today and her pain is severe as to complete a diagnostic S1 transforaminal injection. I'm also going ordered MRI of the lumbar spine. We had contemplated MRI of the lumbar spine anyway since it has been since 2009 since she had that looked at. Given the fall on the continued radicular complaints of the fact that it wasn't helped really even before her fall I think is wise to go ahead and repeat the MRI at this point. She has no focal weakness or red flex symptoms but I do think it is warranted.  Follow-Up Instructions: Return MRI review.  Orders:  Orders Placed This Encounter  Procedures  . MR LUMBAR SPINE WO CONTRAST  . Epidural Steroid injection    Meds ordered this encounter  Medications  . lidocaine (PF) (XYLOCAINE) 1 % injection 0.3 mL  . methylPREDNISolone acetate (DEPO-MEDROL) injection 80 mg      Procedures: S1 Lumbosacral Transforaminal Epidural Steroid Injection -  Sub-Pedicular Approach with Fluoroscopic Guidance   Patient: [name] [name]      Date of Birth: [birth year] MRN: [id number] PCP: Hoyt Koch, MD      Visit Date: 05/22/2016   Universal Protocol:    Date/Time: 11/01/176:00 AM  Consent Given By: the patient  Position:  PRONE  Additional Comments: Vital signs were monitored before and after the procedure. Patient was prepped and draped in the usual sterile fashion. The correct patient, procedure, and site was verified.   Injection Procedure Details:  Procedure Site One Meds Administered:  Meds ordered this encounter  Medications  . lidocaine (PF) (XYLOCAINE) 1 % injection 0.3 mL  . methylPREDNISolone acetate (DEPO-MEDROL) injection 80 mg     Laterality: Left  Location/Site:  S1 Foramen   Needle size: 22 G  Needle type: Spinal  Needle Placement: Transforaminal  Findings:  -Contrast Used: 2 mL iohexol 180 mg iodine/mL   -Comments: Excellent flow of contrast along the nerve and into the epidural space.  Procedure Details: After squaring off the sacral end-plate to get a true AP view, the C-arm was positioned so that the best possible view of the S1 foramen was visualized. The soft tissues overlying this structure were infiltrated with 2-3 ml. of 1% Lidocaine without Epinephrine.    The  spinal needle was inserted toward the target using a "trajectory" view along the fluoroscope beam.  Under AP and lateral visualization, the needle was advanced so it did not puncture dura. Biplanar projections were used to confirm position. Aspiration was confirmed to be negative for CSF and/or blood. A 1-2 ml. volume of Isovue-250 was injected and flow of contrast was noted at each level. Radiographs were obtained for documentation purposes.   After attaining the desired flow of contrast documented above, a 0.5 to 1.0 ml test dose of 0.25% Marcaine was injected into each respective transforaminal space.  The patient was observed for  90 seconds post injection.  After no sensory deficits were reported, and normal lower extremity motor function was noted,   the above injectate was administered so that equal amounts of the injectate were placed at each foramen (level) into the transforaminal epidural space.   Additional Comments:  The patient tolerated the procedure well Dressing: Band-Aid    Post-procedure details: Patient was observed during the procedure. Post-procedure instructions were reviewed.  Patient left the clinic in stable condition.        Other Procedures: No procedures performed   Clinical Data: No additional findings.   Subjective: Chief Complaint  Patient presents with  . Lower Back - Pain    HPIDid well with last injection. Pain returned  3 weeks ago. Left side into buttock and shooting pain down back of leg to foot with walking. Numbness down leg into foot.Unfortunately, since I have seen her she has had a fall ground level fall. She did not seek any medical attention. She is conservative measures with heat and ice and rest. She was having return of symptoms prior to the fall but feels like the fall is made things worse as well. She's had no focal weakness or bowel or bladder dysfunction. She's had no dizziness or any other reason the fall she said she just trips. Most of her symptoms are standing and ambulating. She gets relief at rest. She denies any right-sided complaints. While the last injection did seem to help some but it did not last very long. At other times only seen her.  Taking tramadol which helps some  No bllod thinners and no dye allergy  Has driver-Debra Hines-Sister  Review of Systems  Constitutional: Negative for chills, fatigue, fever and unexpected weight change.  HENT: Negative for sore throat and trouble swallowing.   Eyes: Negative for photophobia and visual disturbance.  Respiratory: Negative for chest tightness and shortness of breath.   Cardiovascular:  Negative for chest pain.  Gastrointestinal: Negative for abdominal pain.  Endocrine: Negative for cold intolerance and heat intolerance.  Musculoskeletal: Negative for myalgias.  Skin: Negative for color change and rash.  Neurological: Negative for speech difficulty and headaches.  Psychiatric/Behavioral: Negative for confusion. The patient is not nervous/anxious.   All other systems reviewed and are negative.    Objective: Vital Signs: BP (!) 155/80 (BP Location: Right Arm, Patient Position: Sitting, Cuff Size: Normal)   Pulse 61   Temp 98.1 F (36.7 C) (Oral)   SpO2 99%    Physical Exam  Constitutional: She is oriented to person, place, and time. She appears well-developed and well-nourished. No distress.  HENT:  Head: Normocephalic and atraumatic.  Nose: Nose normal.  Mouth/Throat: Oropharynx is clear and moist.  Eyes: Conjunctivae are normal. Pupils are equal, round, and reactive to light.  Neck: Normal range of motion. Neck supple.  Cardiovascular: Regular rhythm and intact distal pulses.  Pulmonary/Chest: Effort normal and breath sounds normal.  Abdominal: She exhibits no distension.  Neurological: She is alert and oriented to person, place, and time.  Skin: Skin is warm.  Psychiatric: She has a normal mood and affect. Her behavior is normal.  Nursing note and vitals reviewed.  The patient ambulates without aid with a normal gait.  They have no pain with hip internal or external rotation.  There is concordant back pain pain with extension rotation of the lumbar spine. They have mild pain over the greater trochanters or PSIS.  On manual muscle testing there is 5/5 strength in all muscle groups of the lower extremities bilaterally without deficits.  T  There is no clonus bilaterally. Slump test is equivocal on the left.  Ortho Exam  Specialty Comments:  No specialty comments available. Imaging: No results found.   PMFS History: Patient Active Problem List    Diagnosis Date Noted  . Neck pain 03/05/2016  . Cervical paraspinal muscle spasm 03/01/2016  . H/O Bell's palsy 01/09/2016  . 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 Cough variant asthma vs uacs vs bronchiectasis 01/14/2015  . Hyperglycemia 10/11/2014  . Esophageal reflux 09/22/2014  . Long term current use of systemic steroids - prednisone 08/25/2012  . Osteopenia 12/25/2011  . Immunosuppression - on chronic prednisone, hx of Remicade and azathioprine 07/09/2011  . Crohn's colitis ([place])   . Hypertension   . Arthritis    Past Medical History:  Diagnosis Date  . Allergy    year around allergies   . Arthritis    shoulder  . Bell's palsy   . Blood transfusion    1966  . Blood transfusion without reported diagnosis   . Cataract    bilaterally removed  . Colon polyps    adenomatous polyp June 2012  . Complication of anesthesia    slow to wake  . Crohn's colitis ([place])   . Dry eyes    left eye worst one  . GERD (gastroesophageal reflux disease)   . Headache(784.0)    hx migraines  . Hypertension   . Shortness of breath     Family History  Problem Relation Age of Onset  . Leukemia Mother   . Heart disease Mother   . Heart disease Father   . Heart attack Father   . Ovarian cancer Paternal Aunt     1/2 aunt  . Breast cancer Paternal Aunt     1/2 aunt  . Throat cancer Paternal Aunt     1/2 aunt  . Parkinsonism Brother   . Heart disease Brother   . Leukemia Maternal Grandfather   . Anesthesia problems Neg Hx   . Hypotension Neg Hx   . Malignant hyperthermia Neg Hx   . Pseudochol deficiency Neg Hx   . Colon cancer Neg Hx   . Diabetes Neg Hx   . Esophageal cancer Neg Hx   . Rectal cancer Neg Hx   . Stomach cancer Neg Hx    Past Surgical History:  Procedure Laterality Date  . ABDOMINAL HYSTERECTOMY    . APPENDECTOMY    . CATARACT EXTRACTION Bilateral    Separate dates  . CHOLECYSTECTOMY    . COLONOSCOPY  multiple  . flex sigmoidoscopy with biospy  01/17/12    . FLEXIBLE SIGMOIDOSCOPY  06/08/2011   severe Crohn's colitis to descending colon at least  . FOOT GANGLION EXCISION     left  . SHOULDER SURGERY     right  . TONSILLECTOMY  Social History   Occupational History  . retired  Retired   Social History Main Topics  . Smoking status: Former Smoker    Packs/day: 0.25    Years: 10.00    Types: Cigarettes    Quit date: 09/26/1968  . Smokeless tobacco: Never Used  . Alcohol use No     Comment: rare wine  . Drug use: No  . Sexual activity: No

## 2016-06-09 ENCOUNTER — Ambulatory Visit
Admission: RE | Admit: 2016-06-09 | Discharge: 2016-06-09 | Disposition: A | Discharge status: Home / Self Care | Payer: Medicare Other | Source: Ambulatory Visit | Attending: Physical Medicine and Rehabilitation | Admitting: Physical Medicine and Rehabilitation

## 2016-06-09 DIAGNOSIS — M5126 Other intervertebral disc displacement, lumbar region: Secondary | ICD-10-CM | POA: Diagnosis not present

## 2016-06-09 DIAGNOSIS — M5416 Radiculopathy, lumbar region: Secondary | ICD-10-CM

## 2016-06-11 NOTE — Progress Notes (Signed)
Patient already scheduled for MRI review.

## 2016-06-11 NOTE — Progress Notes (Signed)
Make sure patient has follow up for MRI

## 2016-06-12 ENCOUNTER — Encounter (INDEPENDENT_AMBULATORY_CARE_PROVIDER_SITE_OTHER): Payer: Self-pay | Admitting: Physical Medicine and Rehabilitation

## 2016-06-12 ENCOUNTER — Ambulatory Visit (INDEPENDENT_AMBULATORY_CARE_PROVIDER_SITE_OTHER): Payer: Medicare Other | Admitting: Physical Medicine and Rehabilitation

## 2016-06-12 VITALS — BP 149/81 | HR 50

## 2016-06-12 DIAGNOSIS — M25552 Pain in left hip: Secondary | ICD-10-CM | POA: Diagnosis not present

## 2016-06-12 DIAGNOSIS — M48062 Spinal stenosis, lumbar region with neurogenic claudication: Secondary | ICD-10-CM | POA: Diagnosis not present

## 2016-06-12 DIAGNOSIS — M5416 Radiculopathy, lumbar region: Secondary | ICD-10-CM | POA: Diagnosis not present

## 2016-06-12 NOTE — Progress Notes (Signed)
[name] [name] - 75 y.o. female MRN [id number]  Date of birth: 20-Jun-1941  Office Visit Note: Visit Date: 06/12/2016 PCP: Hoyt Koch, MD Referred by: Hoyt Koch, *  Subjective: Chief Complaint  Patient presents with  . Lower Back - Pain   HPI: [name] [name] is a 75 year old female with chronic history of low back and left hip and buttock pain. Her pain has been ongoing for many years. She is to do well with epidural injection particularly from an L5 transforaminal approach and we would see her maybe twice a year. Most recently these injections have not helped very much. Ultimately I decided after seeing her last time in completing an S1 transforaminal injection that didn't seem to help very much although today she stated did help some and she is actually had more tolerable pain levels down to a 5 out of 10. We decided to get an MRI of the lumbar spine since her other MRI was several years old. This was to see if there was any worsening stenosis or any other problems. This is reviewed below and was reviewed with the patient. Her symptoms are still very consistent with left low back and buttock pain deep to the buttock area and then laterally down the lateral leg into the lateral calf and top of the foot in a fairly classic L5 distribution. She denies any right-sided complaints. Most of her pain is with standing and ambulating. She gets some relief at rest. She does take some follow all other medications insulin help a little bit. She has not had a lot of other medications. She has not had prior spine surgery. She does state that her son is undergoing some neck surgery very soon and has had lumbar spine surgery as well as her husband.     Review of Systems  Constitutional: Negative for chills, fever, malaise/fatigue and weight loss.  HENT: Negative for hearing loss and sinus pain.   Eyes: Negative for blurred vision, double vision and photophobia.  Respiratory: Negative for  cough and shortness of breath.   Cardiovascular: Negative for chest pain, palpitations and leg swelling.  Gastrointestinal: Negative for abdominal pain, nausea and vomiting.  Genitourinary: Negative for flank pain.  Musculoskeletal: Positive for back pain. Negative for myalgias.  Skin: Negative for itching and rash.  Neurological: Positive for tingling. Negative for tremors, focal weakness and weakness.  Endo/Heme/Allergies: Negative.   Psychiatric/Behavioral: Negative for depression.  All other systems reviewed and are negative.  Otherwise per HPI.  Assessment & Plan: Visit Diagnoses:  1. Lumbar radiculopathy   2. Spinal stenosis of lumbar region with neurogenic claudication   3. Pain in left hip     Plan: Findings:  Chronic history of low back and left hip and leg pain consistent with an L5 radiculopathy radiculitis. She's to do well with transforaminal epidural injections but just lately those have not helped as well. The last one that we completed in July did help for several weeks to monitor but the pain returned. S1 transforaminal injection didn't help very much. Dizziness make a little bit more tolerable. Updated MRI shows worsening spondylosis and degenerative changes and in a general sense with continued left lateral disc herniation with severe foraminal narrowing on the left at L5. This is likely the source of her pain given her symptoms. She also has some facet arthropathy at L4-5 which has worsened and I feel like there is a mild degree of stenosis of MRI worse as the MRI. We discussed  this at length and I would like to repeat another L5 transforaminal injection because they have helped and I think if we are careful in getting medication into the small area maybe we can get this to calm down. If it doesn't seem to be beneficial we would look at more medication tight management and likely spine surgery or neurosurgical evaluation.    Meds & Orders: No orders of the defined types were  placed in this encounter.  No orders of the defined types were placed in this encounter.   Follow-up: Return for Schedule left L5 transforaminal epidural steroid injection.   Procedures: No procedures performed  No notes on file   Clinical History: L-spine MRI  06/10/2016 IMPRESSION: 1. Left eccentric L5-S1 disc bulge extending into the far lateral space and encroaching on the exiting left L5 nerve root with associated severe left foraminal stenosis. 2. Severe L4-5 facet arthrosis with resultant grade 1 anterolisthesis. 3. Right subarticular disc protrusion at L4-L5 mildly narrows the right lateral recess. Correlate for symptoms of right L5 radiculopathy.  She reports that she quit smoking about 47 years ago. Her smoking use included Cigarettes. She has a 2.50 pack-year smoking history. She has never used smokeless tobacco.   Recent Labs  11/01/15 1200  HGBA1C 5.9    Objective:  VS:  HT:    WT:   BMI:     BP:(!) 149/81  HR:(!) 50bpm  TEMP: ( )  RESP:  Physical Exam  Constitutional: She is oriented to person, place, and time. She appears well-developed and well-nourished.  Eyes: Conjunctivae and EOM are normal. Pupils are equal, round, and reactive to light.  Cardiovascular: Normal rate and intact distal pulses.   Pulmonary/Chest: Effort normal.  Musculoskeletal:  The patient ambulates without aid she is somewhat of an antalgic gait to the left. She has concordant pain with extension rotation to the left.  Neurological: She is alert and oriented to person, place, and time. She has normal strength. She displays abnormal reflex. She displays no atrophy. She displays no Babinski's sign on the right side. She displays no Babinski's sign on the left side.  Reflex Scores:      Achilles reflexes are 2+ on the right side and 1+ on the left side. Skin: Skin is warm and dry. No rash noted. No erythema.  Psychiatric: She has a normal mood and affect. Her behavior is normal.  Nursing  note and vitals reviewed.   Ortho Exam Imaging: No results found.  Past Medical/Family/Surgical/Social History: Medications & Allergies reviewed per EMR Patient Active Problem List   Diagnosis Date Noted  . Neck pain 03/05/2016  . Cervical paraspinal muscle spasm 03/01/2016  . H/O Bell's palsy 01/09/2016  . 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 Cough variant asthma vs uacs vs bronchiectasis 01/14/2015  . Hyperglycemia 10/11/2014  . Esophageal reflux 09/22/2014  . Long term current use of systemic steroids - prednisone 08/25/2012  . Osteopenia 12/25/2011  . Immunosuppression - on chronic prednisone, hx of Remicade and azathioprine 07/09/2011  . Crohn's colitis ([place])   . Hypertension   . Arthritis    Past Medical History:  Diagnosis Date  . Allergy    year around allergies   . Arthritis    shoulder  . Bell's palsy   . Blood transfusion    1966  . Blood transfusion without reported diagnosis   . Cataract    bilaterally removed  . Colon polyps    adenomatous polyp June 2012  . Complication  of anesthesia    slow to wake  . Crohn's colitis ([place])   . Dry eyes    left eye worst one  . GERD (gastroesophageal reflux disease)   . Headache(784.0)    hx migraines  . Hypertension   . Shortness of breath    Family History  Problem Relation Age of Onset  . Leukemia Mother   . Heart disease Mother   . Heart disease Father   . Heart attack Father   . Ovarian cancer Paternal Aunt     1/2 aunt  . Breast cancer Paternal Aunt     1/2 aunt  . Throat cancer Paternal Aunt     1/2 aunt  . Parkinsonism Brother   . Heart disease Brother   . Leukemia Maternal Grandfather   . Anesthesia problems Neg Hx   . Hypotension Neg Hx   . Malignant hyperthermia Neg Hx   . Pseudochol deficiency Neg Hx   . Colon cancer Neg Hx   . Diabetes Neg Hx   . Esophageal cancer Neg Hx   . Rectal cancer Neg Hx   . Stomach cancer Neg Hx    Past Surgical History:  Procedure Laterality Date  .  ABDOMINAL HYSTERECTOMY    . APPENDECTOMY    . CATARACT EXTRACTION Bilateral    Separate dates  . CHOLECYSTECTOMY    . COLONOSCOPY  multiple  . flex sigmoidoscopy with biospy  01/17/12  . FLEXIBLE SIGMOIDOSCOPY  06/08/2011   severe Crohn's colitis to descending colon at least  . FOOT GANGLION EXCISION     left  . SHOULDER SURGERY     right  . TONSILLECTOMY     Social History   Occupational History  . retired  Retired   Social History Main Topics  . Smoking status: Former Smoker    Packs/day: 0.25    Years: 10.00    Types: Cigarettes    Quit date: 09/26/1968  . Smokeless tobacco: Never Used  . Alcohol use No     Comment: rare wine  . Drug use: No  . Sexual activity: No

## 2016-06-18 ENCOUNTER — Ambulatory Visit (INDEPENDENT_AMBULATORY_CARE_PROVIDER_SITE_OTHER): Payer: Medicare Other | Admitting: Physical Medicine and Rehabilitation

## 2016-06-18 ENCOUNTER — Encounter (INDEPENDENT_AMBULATORY_CARE_PROVIDER_SITE_OTHER): Payer: Self-pay | Admitting: Physical Medicine and Rehabilitation

## 2016-06-18 VITALS — BP 132/78 | HR 59

## 2016-06-18 DIAGNOSIS — M5416 Radiculopathy, lumbar region: Secondary | ICD-10-CM | POA: Diagnosis not present

## 2016-06-18 MED ORDER — METHYLPREDNISOLONE ACETATE 80 MG/ML IJ SUSP
80.0000 mg | Freq: Once | INTRAMUSCULAR | Status: AC
  Administered 2016-06-18: 80 mg

## 2016-06-18 MED ORDER — LIDOCAINE HCL (PF) 1 % IJ SOLN
0.3300 mL | Freq: Once | INTRAMUSCULAR | Status: AC
Start: 2016-06-18 — End: 2016-06-18
  Administered 2016-06-18: 0.3 mL

## 2016-06-18 NOTE — Procedures (Signed)
Lumbosacral Transforaminal Epidural Steroid Injection - Infraneural Approach with Fluoroscopic Guidance  Patient: [name] [name]      Date of Birth: 1941-04-27 MRN: [id number] PCP: Hoyt Koch, MD      Visit Date: 06/18/2016   Universal Protocol:    Date/Time: 11/27/172:05 PM  Consent Given By: the patient  Position: PRONE   Additional Comments: Vital signs were monitored before and after the procedure. Patient was prepped and draped in the usual sterile fashion. The correct patient, procedure, and site was verified.   Injection Procedure Details:  Procedure Site One Meds Administered:  Meds ordered this encounter  Medications  . lidocaine (PF) (XYLOCAINE) 1 % injection 0.3 mL  . methylPREDNISolone acetate (DEPO-MEDROL) injection 80 mg      Laterality: Left  Location/Site:  L5-S1  Needle size: 22 G  Needle type: Spinal  Needle Placement: Transforaminal  Findings:  -Contrast Used: 1 mL iohexol 180 mg iodine/mL   -Comments: Excellent flow of contrast along the nerve and into the epidural space.  Procedure Details: After squaring off the end-plates of the desired vertebral level to get a true AP view, the C-arm was obliqued to the painful side so that the superior articulating process is positioned about 1/3 the length of the inferior endplate.  The needle was aimed toward the junction of the superior articular process and the transverse process of the inferior vertebrae. The needle's initial entry is in the lower third of the foramen through Kambin's triangle. The soft tissues overlying this target were infiltrated with 2-3 ml. of 1% Lidocaine without Epinephrine.  The spinal needle was then inserted and advanced toward the target using a "trajectory" view along the fluoroscope beam.  Under AP and lateral visualization, the needle was advanced so it did not puncture dura and did not traverse medially beyond the 6 o'clock position of the pedicle. Bi-planar  projections were used to confirm position. Aspiration was confirmed to be negative for CSF and/or blood. A 1-2 ml. volume of Isovue-250 was injected and flow of contrast was noted at each level. Radiographs were obtained for documentation purposes.   After attaining the desired flow of contrast documented above, a 0.5 to 1.0 ml test dose of 0.25% Marcaine was injected into each respective transforaminal space.  The patient was observed for 90 seconds post injection.  After no sensory deficits were reported, and normal lower extremity motor function was noted,   the above injectate was administered so that equal amounts of the injectate were placed at each foramen (level) into the transforaminal epidural space.   Additional Comments:  The patient tolerated the procedure well Dressing: Band-Aid    Post-procedure details: Patient was observed during the procedure. Post-procedure instructions were reviewed.  Patient left the clinic in stable condition.

## 2016-06-18 NOTE — Progress Notes (Signed)
[name] [name] - 75 y.o. female MRN [id number]  Date of birth: February 14, 1941  Office Visit Note: Visit Date: 06/18/2016 PCP: Hoyt Koch, MD Referred by: Hoyt Koch, *  Subjective: Chief Complaint  Patient presents with  . Lower Back - Pain   HPI: [name] [name] is a 75 year old female who is on chronic systemic steroids rheumatologic issues. She's having left radicular leg pain and we saw her recently for evaluation. She is here today for left L5 transforaminal injection for radiculitis radiculopathy.   patient is here today for planned left L5 transforaminal injection. No change in symptoms.   ROS Otherwise per HPI.  Assessment & Plan: Visit Diagnoses:  1. Lumbar radiculopathy     Plan: Findings:  Left L5 transforaminal injection. Please see our prior evaluation and management note for further details and justification.    Meds & Orders:  Meds ordered this encounter  Medications  . lidocaine (PF) (XYLOCAINE) 1 % injection 0.3 mL  . methylPREDNISolone acetate (DEPO-MEDROL) injection 80 mg    Orders Placed This Encounter  Procedures  . Epidural Steroid injection    Follow-up: Return if symptoms worsen or fail to improve.   Procedures: No procedures performed  Lumbosacral Transforaminal Epidural Steroid Injection - Infraneural Approach with Fluoroscopic Guidance  Patient: [name]      Date of Birth: [birth year] MRN: [id number] PCP: Hoyt Koch, MD      Visit Date: 06/18/2016   Universal Protocol:    Date/Time: 11/27/172:05 PM  Consent Given By: the patient  Position: PRONE   Additional Comments: Vital signs were monitored before and after the procedure. Patient was prepped and draped in the usual sterile fashion. The correct patient, procedure, and site was verified.   Injection Procedure Details:  Procedure Site One Meds Administered:  Meds ordered this encounter  Medications  . lidocaine (PF) (XYLOCAINE) 1 % injection  0.3 mL  . methylPREDNISolone acetate (DEPO-MEDROL) injection 80 mg      Laterality: Left  Location/Site:  L5-S1  Needle size: 22 G  Needle type: Spinal  Needle Placement: Transforaminal  Findings:  -Contrast Used: 1 mL iohexol 180 mg iodine/mL   -Comments: Excellent flow of contrast along the nerve and into the epidural space.  Procedure Details: After squaring off the end-plates of the desired vertebral level to get a true AP view, the C-arm was obliqued to the painful side so that the superior articulating process is positioned about 1/3 the length of the inferior endplate.  The needle was aimed toward the junction of the superior articular process and the transverse process of the inferior vertebrae. The needle's initial entry is in the lower third of the foramen through Kambin's triangle. The soft tissues overlying this target were infiltrated with 2-3 ml. of 1% Lidocaine without Epinephrine.  The spinal needle was then inserted and advanced toward the target using a "trajectory" view along the fluoroscope beam.  Under AP and lateral visualization, the needle was advanced so it did not puncture dura and did not traverse medially beyond the 6 o'clock position of the pedicle. Bi-planar projections were used to confirm position. Aspiration was confirmed to be negative for CSF and/or blood. A 1-2 ml. volume of Isovue-250 was injected and flow of contrast was noted at each level. Radiographs were obtained for documentation purposes.   After attaining the desired flow of contrast documented above, a 0.5 to 1.0 ml test dose of 0.25% Marcaine was injected into each respective transforaminal space.  The  patient was observed for 90 seconds post injection.  After no sensory deficits were reported, and normal lower extremity motor function was noted,   the above injectate was administered so that equal amounts of the injectate were placed at each foramen (level) into the transforaminal epidural  space.   Additional Comments:  The patient tolerated the procedure well Dressing: Band-Aid    Post-procedure details: Patient was observed during the procedure. Post-procedure instructions were reviewed.  Patient left the clinic in stable condition.   Clinical History: L-spine MRI  06/10/2016 IMPRESSION: 1. Left eccentric L5-S1 disc bulge extending into the far lateral space and encroaching on the exiting left L5 nerve root with associated severe left foraminal stenosis. 2. Severe L4-5 facet arthrosis with resultant grade 1 anterolisthesis. 3. Right subarticular disc protrusion at L4-L5 mildly narrows the right lateral recess. Correlate for symptoms of right L5 radiculopathy.  She reports that she quit smoking about 47 years ago. Her smoking use included Cigarettes. She has a 2.50 pack-year smoking history. She has never used smokeless tobacco.   Recent Labs  11/01/15 1200  HGBA1C 5.9    Objective:  VS:  HT:    WT:   BMI:     BP:132/78  HR:(!) 59bpm  TEMP: ( )  RESP:95 % Physical Exam  Musculoskeletal:  Osteoarthritic changes of the hands. Somewhat of a cushingoid appearance in the face and neck. She has good distal strength.    Ortho Exam Imaging: No results found.  Past Medical/Family/Surgical/Social History: Medications & Allergies reviewed per EMR Patient Active Problem List   Diagnosis Date Noted  . Neck pain 03/05/2016  . Cervical paraspinal muscle spasm 03/01/2016  . H/O Bell's palsy 01/09/2016  . 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 Cough variant asthma vs uacs vs bronchiectasis 01/14/2015  . Hyperglycemia 10/11/2014  . Esophageal reflux 09/22/2014  . Long term current use of systemic steroids - prednisone 08/25/2012  . Osteopenia 12/25/2011  . Immunosuppression - on chronic prednisone, hx of Remicade and azathioprine 07/09/2011  . Crohn's colitis ([place])   . Hypertension   . Arthritis    Past Medical History:  Diagnosis Date  . Allergy     year around allergies   . Arthritis    shoulder  . Bell's palsy   . Blood transfusion    1966  . Blood transfusion without reported diagnosis   . Cataract    bilaterally removed  . Colon polyps    adenomatous polyp June 2012  . Complication of anesthesia    slow to wake  . Crohn's colitis ([place])   . Dry eyes    left eye worst one  . GERD (gastroesophageal reflux disease)   . Headache(784.0)    hx migraines  . Hypertension   . Shortness of breath    Family History  Problem Relation Age of Onset  . Leukemia Mother   . Heart disease Mother   . Heart disease Father   . Heart attack Father   . Ovarian cancer Paternal Aunt     1/2 aunt  . Breast cancer Paternal Aunt     1/2 aunt  . Throat cancer Paternal Aunt     1/2 aunt  . Parkinsonism Brother   . Heart disease Brother   . Leukemia Maternal Grandfather   . Anesthesia problems Neg Hx   . Hypotension Neg Hx   . Malignant hyperthermia Neg Hx   . Pseudochol deficiency Neg Hx   . Colon cancer Neg Hx   .  Diabetes Neg Hx   . Esophageal cancer Neg Hx   . Rectal cancer Neg Hx   . Stomach cancer Neg Hx    Past Surgical History:  Procedure Laterality Date  . ABDOMINAL HYSTERECTOMY    . APPENDECTOMY    . CATARACT EXTRACTION Bilateral    Separate dates  . CHOLECYSTECTOMY    . COLONOSCOPY  multiple  . flex sigmoidoscopy with biospy  01/17/12  . FLEXIBLE SIGMOIDOSCOPY  06/08/2011   severe Crohn's colitis to descending colon at least  . FOOT GANGLION EXCISION     left  . SHOULDER SURGERY     right  . TONSILLECTOMY     Social History   Occupational History  . retired  Retired   Social History Main Topics  . Smoking status: Former Smoker    Packs/day: 0.25    Years: 10.00    Types: Cigarettes    Quit date: 09/26/1968  . Smokeless tobacco: Never Used  . Alcohol use No     Comment: rare wine  . Drug use: No  . Sexual activity: No

## 2016-06-18 NOTE — Patient Instructions (Signed)

## 2016-06-21 ENCOUNTER — Ambulatory Visit: Payer: Medicare Other | Admitting: Internal Medicine

## 2016-06-22 ENCOUNTER — Encounter: Payer: Self-pay | Admitting: Internal Medicine

## 2016-07-17 ENCOUNTER — Other Ambulatory Visit: Payer: Self-pay | Admitting: Internal Medicine

## 2016-07-19 ENCOUNTER — Other Ambulatory Visit: Payer: Self-pay | Admitting: Internal Medicine

## 2016-07-19 NOTE — Telephone Encounter (Signed)
How many refills Sir?

## 2016-07-20 NOTE — Telephone Encounter (Signed)
Refill x 2

## 2016-07-27 ENCOUNTER — Telehealth: Payer: Self-pay | Admitting: Internal Medicine

## 2016-07-27 NOTE — Telephone Encounter (Signed)
Patient reports that she had had one week of rectal bleeding. She is having lower abdominal cramping. She is currently taking 10 mg of prednisone. I have scheduled her for an appt with you for next Thursday.  Please advise if needs additional orders until the appt

## 2016-07-27 NOTE — Telephone Encounter (Signed)
Patient notified

## 2016-07-27 NOTE — Telephone Encounter (Signed)
Change to 20 mg daily of prednisone until she sees me

## 2016-08-02 ENCOUNTER — Encounter: Payer: Self-pay | Admitting: Internal Medicine

## 2016-08-02 ENCOUNTER — Ambulatory Visit (INDEPENDENT_AMBULATORY_CARE_PROVIDER_SITE_OTHER): Payer: Medicare Other | Admitting: Internal Medicine

## 2016-08-02 DIAGNOSIS — K50111 Crohn's disease of large intestine with rectal bleeding: Secondary | ICD-10-CM

## 2016-08-02 NOTE — Patient Instructions (Signed)
Pt to call and let us know how she is doing.    Stay on Prednisone.  Routine follow-up in 6 months.  I appreciate the opportunity to care for you.  [name], MD, [facility]

## 2016-08-02 NOTE — Progress Notes (Signed)
   [place] 76 y.o. 1941-05-01 [id number]  Assessment & Plan:  Crohn's colitis Seems to be flaring On 20 mg prednisone since 6 d ago - seems better She will check in next week re: sxs and will decide stay or increase dose RTC 6 mos   I appreciate the opportunity to care for this patient. [id number] Becky [place], MD   Subjective:   Chief Complaint: rectal bleeding, diarrhea, cramps  HPI Called last week - increasing sxs of abd cramps, diarrhea and some rectal bleeding Increased prednisone to 20 mg qd then and she is feeling better though not completely Some stress - brother to SNF due to weakness, Parkinson's; aunt died; and she has had sinus sxs, hip and back pain helped by injections but may need disc surgery. Had MRI  IMPRESSION: 1. Left eccentric L5-S1 disc bulge extending into the far lateral space and encroaching on the exiting left L5 nerve root with associated severe left foraminal stenosis. 2. Severe L4-5 facet arthrosis with resultant grade 1 anterolisthesis. 3. Right subarticular disc protrusion at L4-L5 mildly narrows the right lateral recess. Correlate for symptoms of right L5 radiculopathy.  Medications, allergies, past medical history, past surgical history, family history and social history are reviewed and updated in the EMR.  Review of Systems As above  Objective:   Physical Exam @BP  130/64   Pulse (!) 58   Ht 5' 5.5" (1.664 m)   Wt 176 lb 8 oz (80.1 kg)   BMI 28.92 kg/m @  General:  NAD, mildly cushingoid Eyes:   anicteric Lungs:  clear Heart::  S1S2 no rubs, murmurs or gallops Abdomen:  soft and mildly tender LQ's , BS+ Ext:   no edema, cyanosis or clubbing    Data Reviewed:   As per HPI Reviewed Dr. Romona Curls 06/12/16 note re radiculopathy also  15 minutes time spent with patient > half in counseling coordination of care

## 2016-08-02 NOTE — Assessment & Plan Note (Signed)
Seems to be flaring On 20 mg prednisone since 6 d ago - seems better She will check in next week re: sxs and will decide stay or increase dose RTC 6 mos

## 2016-08-03 ENCOUNTER — Ambulatory Visit: Payer: Medicare Other | Admitting: Nurse Practitioner

## 2016-08-17 DIAGNOSIS — R05 Cough: Secondary | ICD-10-CM | POA: Diagnosis not present

## 2016-08-17 DIAGNOSIS — J111 Influenza due to unidentified influenza virus with other respiratory manifestations: Secondary | ICD-10-CM | POA: Diagnosis not present

## 2016-09-07 ENCOUNTER — Ambulatory Visit: Payer: Medicare Other | Admitting: Internal Medicine

## 2016-09-10 ENCOUNTER — Telehealth (INDEPENDENT_AMBULATORY_CARE_PROVIDER_SITE_OTHER): Payer: Self-pay | Admitting: Physical Medicine and Rehabilitation

## 2016-09-10 NOTE — Telephone Encounter (Signed)
Scheduled for 09/19/16 at 1400.

## 2016-09-10 NOTE — Telephone Encounter (Signed)
If helped then yes otherwise OV or if new trauma etc

## 2016-09-13 ENCOUNTER — Other Ambulatory Visit: Payer: Self-pay | Admitting: Internal Medicine

## 2016-09-17 ENCOUNTER — Telehealth (INDEPENDENT_AMBULATORY_CARE_PROVIDER_SITE_OTHER): Payer: Self-pay | Admitting: Physical Medicine and Rehabilitation

## 2016-09-17 NOTE — Telephone Encounter (Signed)
She can take 2 extra strength Tylenol along with the tramadol. Or alternatively she can take 2 tramadol and 1 Tylenol at one time.

## 2016-09-17 NOTE — Telephone Encounter (Signed)
Called patient to discuss and she will try the Tylenol.

## 2016-09-19 ENCOUNTER — Encounter (INDEPENDENT_AMBULATORY_CARE_PROVIDER_SITE_OTHER): Payer: Medicare Other | Admitting: Physical Medicine and Rehabilitation

## 2016-09-26 ENCOUNTER — Ambulatory Visit (INDEPENDENT_AMBULATORY_CARE_PROVIDER_SITE_OTHER): Payer: Medicare Other | Admitting: Physical Medicine and Rehabilitation

## 2016-09-26 ENCOUNTER — Ambulatory Visit (INDEPENDENT_AMBULATORY_CARE_PROVIDER_SITE_OTHER): Payer: Medicare Other

## 2016-09-26 ENCOUNTER — Encounter (INDEPENDENT_AMBULATORY_CARE_PROVIDER_SITE_OTHER): Payer: Self-pay | Admitting: Physical Medicine and Rehabilitation

## 2016-09-26 VITALS — BP 161/86 | HR 58 | Temp 98.1°F

## 2016-09-26 DIAGNOSIS — M5416 Radiculopathy, lumbar region: Secondary | ICD-10-CM

## 2016-09-26 MED ORDER — METHYLPREDNISOLONE ACETATE 80 MG/ML IJ SUSP
80.0000 mg | Freq: Once | INTRAMUSCULAR | Status: AC
  Administered 2016-09-26: 80 mg

## 2016-09-26 MED ORDER — LIDOCAINE HCL (PF) 1 % IJ SOLN
0.3300 mL | Freq: Once | INTRAMUSCULAR | Status: AC
  Administered 2016-09-26: 0.3 mL

## 2016-09-26 NOTE — Progress Notes (Signed)
[name] [name] - 76 y.o. female MRN [id number]  Date of birth: 03-Dec-1940  Office Visit Note: Visit Date: 09/26/2016 PCP: Hoyt Koch, MD Referred by: Hoyt Koch, *  Subjective: Chief Complaint  Patient presents with  . Lower Back - Pain   HPI: [name] [name] is a 76 year old patient that did well with last injection which was a left L5 transforaminal epidural steroid injection performed in November. Pain returned around 4 weeks ago and gotten worse over time. Worse with walking and standing. Shooting pain down left leg to ankle in a fairly classic L5 distribution. Foot is occasionally numb.Leg gives out at times so she is ambulating with cane. MRI is reviewed again below but does show broad disc herniation with lateral component on the left side affecting L5. She also has pretty bad arthritis at L4-5 with listhesis but no central canal stenosis but there is right lateral recess stenosis.    ROS Otherwise per HPI.  Assessment & Plan: Visit Diagnoses:  1. Lumbar radiculopathy     Plan: Findings:  Chronic worsening history of low back pain in general with left more than right radicular pain and a fairly classic L5 distribution. MRI evidence for this. MRI was 2017. She's had no recent trauma this is all pain that literally got pretty good relief with epidural injection in November. She's had epidural injections before that they were beneficial. Nonetheless she is anxious about not having injections anymore doesn't feel like she wants to continue to have injections. She does want to potentially see a spine surgeon or neurosurgeon for referral for surgery. Her son has seen and had an operation by Dr. Patrice Paradise. She's going to think about the situation and call him back and be happy to place a referral for her. L5 transforaminal injection was completed today.    Meds & Orders:  Meds ordered this encounter  Medications  . lidocaine (PF) (XYLOCAINE) 1 % injection 0.3 mL  .  methylPREDNISolone acetate (DEPO-MEDROL) injection 80 mg    Orders Placed This Encounter  Procedures  . XR C-ARM NO REPORT  . Epidural Steroid injection    Follow-up: Return if symptoms worsen or fail to improve, 2weeks.   Procedures: No procedures performed  Lumbosacral Transforaminal Epidural Steroid Injection - Infraneural Approach with Fluoroscopic Guidance  Patient: [name] [name]      Date of Birth: 07/17/1941 MRN: [id number] PCP: Hoyt Koch, MD      Visit Date: 09/26/2016   Universal Protocol:    Date/Time: 03/07/181:08 PM  Consent Given By: the patient  Position: PRONE   Additional Comments: Vital signs were monitored before and after the procedure. Patient was prepped and draped in the usual sterile fashion. The correct patient, procedure, and site was verified.   Injection Procedure Details:  Procedure Site One Meds Administered:  Meds ordered this encounter  Medications  . lidocaine (PF) (XYLOCAINE) 1 % injection 0.3 mL  . methylPREDNISolone acetate (DEPO-MEDROL) injection 80 mg      Laterality: Left  Location/Site:  L5-S1  Needle size: 22 G  Needle type: Spinal  Needle Placement: Transforaminal  Findings:  -Contrast Used: 1 mL iohexol 180 mg iodine/mL   -Comments: Excellent flow of contrast along the nerve and into the epidural space.  Procedure Details: After squaring off the end-plates of the desired vertebral level to get a true AP view, the C-arm was obliqued to the painful side so that the superior articulating process is positioned about 1/3 the length of  the inferior endplate.  The needle was aimed toward the junction of the superior articular process and the transverse process of the inferior vertebrae. The needle's initial entry is in the lower third of the foramen through Kambin's triangle. The soft tissues overlying this target were infiltrated with 2-3 ml. of 1% Lidocaine without Epinephrine.  The spinal needle was then  inserted and advanced toward the target using a "trajectory" view along the fluoroscope beam.  Under AP and lateral visualization, the needle was advanced so it did not puncture dura and did not traverse medially beyond the 6 o'clock position of the pedicle. Bi-planar projections were used to confirm position. Aspiration was confirmed to be negative for CSF and/or blood. A 1-2 ml. volume of Isovue-250 was injected and flow of contrast was noted at each level. Radiographs were obtained for documentation purposes.   After attaining the desired flow of contrast documented above, a 0.5 to 1.0 ml test dose of 0.25% Marcaine was injected into each respective transforaminal space.  The patient was observed for 90 seconds post injection.  After no sensory deficits were reported, and normal lower extremity motor function was noted,   the above injectate was administered so that equal amounts of the injectate were placed at each foramen (level) into the transforaminal epidural space.   Additional Comments:  The patient tolerated the procedure well Dressing: Band-Aid    Post-procedure details: Patient was observed during the procedure. Post-procedure instructions were reviewed.  Patient left the clinic in stable condition.   Clinical History: L-spine MRI  06/10/2016 IMPRESSION: 1. Left eccentric L5-S1 disc bulge extending into the far lateral space and encroaching on the exiting left L5 nerve root with associated severe left foraminal stenosis. 2. Severe L4-5 facet arthrosis with resultant grade 1 anterolisthesis. 3. Right subarticular disc protrusion at L4-L5 mildly narrows the right lateral recess. Correlate for symptoms of right L5 radiculopathy.  She reports that she quit smoking about 48 years ago. Her smoking use included Cigarettes. She has a 2.50 pack-year smoking history. She has never used smokeless tobacco.   Recent Labs  11/01/15 1200  HGBA1C 5.9    Objective:  VS:  HT:    WT:    BMI:     BP:(!) 161/86  HR:(!) 58bpm  TEMP:98.1 F (36.7 C)(Oral)  RESP:98 % Physical Exam  Musculoskeletal:  Patient ambulates without aid. She has good distal strength. She has no clonus.    Ortho Exam Imaging: Xr C-arm No Report  Result Date: 09/26/2016 Please see Notes or Procedures tab for imaging impression.   Past Medical/Family/Surgical/Social History: Medications & Allergies reviewed per EMR Patient Active Problem List   Diagnosis Date Noted  . Neck pain 03/05/2016  . Cervical paraspinal muscle spasm 03/01/2016  . H/O Bell's palsy 01/09/2016  . 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 Cough variant asthma vs uacs vs bronchiectasis 01/14/2015  . Hyperglycemia 10/11/2014  . Esophageal reflux 09/22/2014  . Long term current use of systemic steroids - prednisone 08/25/2012  . Osteopenia 12/25/2011  . Immunosuppression - on chronic prednisone, hx of Remicade and azathioprine 07/09/2011  . Crohn's colitis ([place])   . Hypertension   . Arthritis    Past Medical History:  Diagnosis Date  . Allergy    year around allergies   . Arthritis    shoulder  . Bell's palsy   . Blood transfusion    1966  . Blood transfusion without reported diagnosis   . Cataract    bilaterally removed  .  Colon polyps    adenomatous polyp June 2012  . Complication of anesthesia    slow to wake  . Crohn's colitis ([place])   . Dry eyes    left eye worst one  . GERD (gastroesophageal reflux disease)   . Headache(784.0)    hx migraines  . Hypertension   . Shortness of breath    Family History  Problem Relation Age of Onset  . Leukemia Mother   . Heart disease Mother   . Heart disease Father   . Heart attack Father   . Ovarian cancer Paternal Aunt     1/2 aunt  . Breast cancer Paternal Aunt     1/2 aunt  . Throat cancer Paternal Aunt     1/2 aunt  . Parkinsonism Brother   . Heart disease Brother   . Leukemia Maternal Grandfather   . Anesthesia problems Neg Hx   . Hypotension Neg  Hx   . Malignant hyperthermia Neg Hx   . Pseudochol deficiency Neg Hx   . Colon cancer Neg Hx   . Diabetes Neg Hx   . Esophageal cancer Neg Hx   . Rectal cancer Neg Hx   . Stomach cancer Neg Hx    Past Surgical History:  Procedure Laterality Date  . ABDOMINAL HYSTERECTOMY    . APPENDECTOMY    . CATARACT EXTRACTION Bilateral    Separate dates  . CHOLECYSTECTOMY    . COLONOSCOPY  multiple  . flex sigmoidoscopy with biospy  01/17/12  . FLEXIBLE SIGMOIDOSCOPY  06/08/2011   severe Crohn's colitis to descending colon at least  . FOOT GANGLION EXCISION     left  . SHOULDER SURGERY     right  . TONSILLECTOMY     Social History   Occupational History  . retired  Retired   Social History Main Topics  . Smoking status: Former Smoker    Packs/day: 0.25    Years: 10.00    Types: Cigarettes    Quit date: 09/26/1968  . Smokeless tobacco: Never Used  . Alcohol use No     Comment: rare wine  . Drug use: No  . Sexual activity: No

## 2016-09-26 NOTE — Procedures (Signed)
Lumbosacral Transforaminal Epidural Steroid Injection - Infraneural Approach with Fluoroscopic Guidance  Patient: [name] [name]      Date of Birth: March 20, 1941 MRN: [id number] PCP: Hoyt Koch, MD      Visit Date: 09/26/2016   Universal Protocol:    Date/Time: 03/07/181:08 PM  Consent Given By: the patient  Position: PRONE   Additional Comments: Vital signs were monitored before and after the procedure. Patient was prepped and draped in the usual sterile fashion. The correct patient, procedure, and site was verified.   Injection Procedure Details:  Procedure Site One Meds Administered:  Meds ordered this encounter  Medications  . lidocaine (PF) (XYLOCAINE) 1 % injection 0.3 mL  . methylPREDNISolone acetate (DEPO-MEDROL) injection 80 mg      Laterality: Left  Location/Site:  L5-S1  Needle size: 22 G  Needle type: Spinal  Needle Placement: Transforaminal  Findings:  -Contrast Used: 1 mL iohexol 180 mg iodine/mL   -Comments: Excellent flow of contrast along the nerve and into the epidural space.  Procedure Details: After squaring off the end-plates of the desired vertebral level to get a true AP view, the C-arm was obliqued to the painful side so that the superior articulating process is positioned about 1/3 the length of the inferior endplate.  The needle was aimed toward the junction of the superior articular process and the transverse process of the inferior vertebrae. The needle's initial entry is in the lower third of the foramen through Kambin's triangle. The soft tissues overlying this target were infiltrated with 2-3 ml. of 1% Lidocaine without Epinephrine.  The spinal needle was then inserted and advanced toward the target using a "trajectory" view along the fluoroscope beam.  Under AP and lateral visualization, the needle was advanced so it did not puncture dura and did not traverse medially beyond the 6 o'clock position of the pedicle. Bi-planar  projections were used to confirm position. Aspiration was confirmed to be negative for CSF and/or blood. A 1-2 ml. volume of Isovue-250 was injected and flow of contrast was noted at each level. Radiographs were obtained for documentation purposes.   After attaining the desired flow of contrast documented above, a 0.5 to 1.0 ml test dose of 0.25% Marcaine was injected into each respective transforaminal space.  The patient was observed for 90 seconds post injection.  After no sensory deficits were reported, and normal lower extremity motor function was noted,   the above injectate was administered so that equal amounts of the injectate were placed at each foramen (level) into the transforaminal epidural space.   Additional Comments:  The patient tolerated the procedure well Dressing: Band-Aid    Post-procedure details: Patient was observed during the procedure. Post-procedure instructions were reviewed.  Patient left the clinic in stable condition.

## 2016-09-26 NOTE — Patient Instructions (Signed)

## 2016-09-30 ENCOUNTER — Other Ambulatory Visit: Payer: Self-pay | Admitting: Internal Medicine

## 2016-10-10 ENCOUNTER — Other Ambulatory Visit: Payer: Self-pay | Admitting: Internal Medicine

## 2016-10-25 ENCOUNTER — Telehealth (INDEPENDENT_AMBULATORY_CARE_PROVIDER_SITE_OTHER): Payer: Self-pay | Admitting: Physical Medicine and Rehabilitation

## 2016-10-25 NOTE — Telephone Encounter (Signed)
Scheduled for 11/07/16 at 1300 with driver.

## 2016-10-25 NOTE — Telephone Encounter (Signed)
Two level facet joint injkection

## 2016-11-07 ENCOUNTER — Encounter (INDEPENDENT_AMBULATORY_CARE_PROVIDER_SITE_OTHER): Payer: Self-pay | Admitting: Physical Medicine and Rehabilitation

## 2016-11-07 ENCOUNTER — Other Ambulatory Visit: Payer: Self-pay | Admitting: Internal Medicine

## 2016-11-07 ENCOUNTER — Ambulatory Visit (INDEPENDENT_AMBULATORY_CARE_PROVIDER_SITE_OTHER): Payer: Medicare Other | Admitting: Physical Medicine and Rehabilitation

## 2016-11-07 ENCOUNTER — Ambulatory Visit (INDEPENDENT_AMBULATORY_CARE_PROVIDER_SITE_OTHER): Payer: Self-pay

## 2016-11-07 VITALS — BP 148/57 | HR 62 | Temp 98.7°F

## 2016-11-07 DIAGNOSIS — M5416 Radiculopathy, lumbar region: Secondary | ICD-10-CM | POA: Diagnosis not present

## 2016-11-07 MED ORDER — METHYLPREDNISOLONE ACETATE 80 MG/ML IJ SUSP
80.0000 mg | Freq: Once | INTRAMUSCULAR | Status: AC
  Administered 2016-11-07: 80 mg

## 2016-11-07 MED ORDER — LIDOCAINE HCL (PF) 1 % IJ SOLN
0.3300 mL | Freq: Once | INTRAMUSCULAR | Status: AC
  Administered 2016-11-07: 0.3 mL

## 2016-11-07 NOTE — Procedures (Signed)
S1 Lumbosacral Transforaminal Epidural Steroid Injection - Sub-Pedicular Approach with Fluoroscopic Guidance   Patient: Stratford      Date of Birth: 11-22-1940 MRN: [id number] PCP: Hoyt Koch, MD      Visit Date: 11/07/2016   Universal Protocol:    Date/Time: 04/18/181:28 PM  Consent Given By: the patient  Position:  PRONE  Additional Comments: Vital signs were monitored before and after the procedure. Patient was prepped and draped in the usual sterile fashion. The correct patient, procedure, and site was verified.   Injection Procedure Details:  Procedure Site One Meds Administered:  Meds ordered this encounter  Medications  . lidocaine (PF) (XYLOCAINE) 1 % injection 0.3 mL  . methylPREDNISolone acetate (DEPO-MEDROL) injection 80 mg    Laterality: Left  Location/Site:  S1 Foramen   Needle size: 22 G  Needle type: Spinal  Needle Placement: Transforaminal  Findings:  -Contrast Used: 1 mL iohexol 180 mg iodine/mL   -Comments: Excellent flow of contrast along the nerve and into the epidural space.  Procedure Details: After squaring off the sacral end-plate to get a true AP view, the C-arm was positioned so that the best possible view of the S1 foramen was visualized. The soft tissues overlying this structure were infiltrated with 2-3 ml. of 1% Lidocaine without Epinephrine.    The spinal needle was inserted toward the target using a "trajectory" view along the fluoroscope beam.  Under AP and lateral visualization, the needle was advanced so it did not puncture dura. Biplanar projections were used to confirm position. Aspiration was confirmed to be negative for CSF and/or blood. A 1-2 ml. volume of Isovue-250 was injected and flow of contrast was noted at each level. Radiographs were obtained for documentation purposes.   After attaining the desired flow of contrast documented above, a 0.5 to 1.0 ml test dose of 0.25% Marcaine was injected into each  respective transforaminal space.  The patient was observed for 90 seconds post injection.  After no sensory deficits were reported, and normal lower extremity motor function was noted,   the above injectate was administered so that equal amounts of the injectate were placed at each foramen (level) into the transforaminal epidural space.   Additional Comments:  The patient tolerated the procedure well Dressing: Band-Aid    Post-procedure details: Patient was observed during the procedure. Post-procedure instructions were reviewed.  Patient left the clinic in stable condition.

## 2016-11-07 NOTE — Progress Notes (Signed)
[name] [name] - 76 y.o. female MRN [id number]  Date of birth: 1941/05/21  Office Visit Note: Visit Date: 11/07/2016 PCP: Hoyt Koch, MD Referred by: Hoyt Koch, *  Subjective: Chief Complaint  Patient presents with  . Lower Back - Pain   HPI: [name] [name] is a 76 year old female with chronic low back and left hip and leg pain consistent with an L5 and S1 radicular pattern. She has failed all manner of conservative care but is done well with intermittent epidural injections and these have been documented there are chart in Epic as well as SRS. The last time I saw her however was in March and we completed a left L5 transforaminal injection. That injection looked well place we did get initial flow which was an intradiscal but then with repositioning it was epidural. She states that she did get relief at distant last very long and likely other injections in the last several months at a time. I think at this point given the fact that she has this mixed L5 and S1 pattern that we should do an S1 transforaminal injection. She has quite a bit of lateral recess and foraminal narrowing due to facet arthropathy at L5-S1.    Lower back pain, left buttock pain radiating down to feet. Numbness and tingling in feet. Last injection helped for a little while, but pain returned.  ROS Otherwise per HPI.  Assessment & Plan: Visit Diagnoses:  1. Lumbar radiculopathy     Plan: Findings:  Diagnostic and hopefully therapeutic left S1 transforaminal epidural steroid injection. Depending on the relief she gets with that we would have to look at possibly facet joint block versus regrouping with a physical therapist and may be a TENS unit. We could look at also more medication management with her.    Meds & Orders:  Meds ordered this encounter  Medications  . lidocaine (PF) (XYLOCAINE) 1 % injection 0.3 mL  . methylPREDNISolone acetate (DEPO-MEDROL) injection 80 mg    Orders Placed This  Encounter  Procedures  . XR C-ARM NO REPORT  . Epidural Steroid injection    Follow-up: Return if symptoms worsen or fail to improve.   Procedures: No procedures performed  S1 Lumbosacral Transforaminal Epidural Steroid Injection - Sub-Pedicular Approach with Fluoroscopic Guidance   Patient: [name] [name]      Date of Birth: January 26, 1941 MRN: [id number] PCP: Hoyt Koch, MD      Visit Date: 11/07/2016   Universal Protocol:    Date/Time: 04/18/181:28 PM  Consent Given By: the patient  Position:  PRONE  Additional Comments: Vital signs were monitored before and after the procedure. Patient was prepped and draped in the usual sterile fashion. The correct patient, procedure, and site was verified.   Injection Procedure Details:  Procedure Site One Meds Administered:  Meds ordered this encounter  Medications  . lidocaine (PF) (XYLOCAINE) 1 % injection 0.3 mL  . methylPREDNISolone acetate (DEPO-MEDROL) injection 80 mg    Laterality: Left  Location/Site:  S1 Foramen   Needle size: 22 G  Needle type: Spinal  Needle Placement: Transforaminal  Findings:  -Contrast Used: 1 mL iohexol 180 mg iodine/mL   -Comments: Excellent flow of contrast along the nerve and into the epidural space.  Procedure Details: After squaring off the sacral end-plate to get a true AP view, the C-arm was positioned so that the best possible view of the S1 foramen was visualized. The soft tissues overlying this structure were infiltrated with 2-3 ml.  of 1% Lidocaine without Epinephrine.    The spinal needle was inserted toward the target using a "trajectory" view along the fluoroscope beam.  Under AP and lateral visualization, the needle was advanced so it did not puncture dura. Biplanar projections were used to confirm position. Aspiration was confirmed to be negative for CSF and/or blood. A 1-2 ml. volume of Isovue-250 was injected and flow of contrast was noted at each level. Radiographs  were obtained for documentation purposes.   After attaining the desired flow of contrast documented above, a 0.5 to 1.0 ml test dose of 0.25% Marcaine was injected into each respective transforaminal space.  The patient was observed for 90 seconds post injection.  After no sensory deficits were reported, and normal lower extremity motor function was noted,   the above injectate was administered so that equal amounts of the injectate were placed at each foramen (level) into the transforaminal epidural space.   Additional Comments:  The patient tolerated the procedure well Dressing: Band-Aid    Post-procedure details: Patient was observed during the procedure. Post-procedure instructions were reviewed.  Patient left the clinic in stable condition.     Clinical History: L-spine MRI  06/10/2016 IMPRESSION: 1. Left eccentric L5-S1 disc bulge extending into the far lateral space and encroaching on the exiting left L5 nerve root with associated severe left foraminal stenosis. 2. Severe L4-5 facet arthrosis with resultant grade 1 anterolisthesis. 3. Right subarticular disc protrusion at L4-L5 mildly narrows the right lateral recess. Correlate for symptoms of right L5 radiculopathy.  She reports that she quit smoking about 48 years ago. Her smoking use included Cigarettes. She has a 2.50 pack-year smoking history. She has never used smokeless tobacco. No results for input(s): HGBA1C, LABURIC in the last 8760 hours.  Objective:  VS:  HT:    WT:   BMI:     BP:(!) 148/57  HR:62bpm  TEMP:98.7 F (37.1 C)(Oral)  RESP:97 % Physical Exam  Musculoskeletal:  The patient ambulates without aid with a forward flexed spine with good distal strength.    Ortho Exam Imaging: Xr C-arm No Report  Result Date: 11/07/2016 Please see Notes or Procedures tab for imaging impression.   Past Medical/Family/Surgical/Social History: Medications & Allergies reviewed per EMR Patient Active Problem List     Diagnosis Date Noted  . Neck pain 03/05/2016  . Cervical paraspinal muscle spasm 03/01/2016  . H/O Bell's palsy 01/09/2016  . 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 Cough variant asthma vs uacs vs bronchiectasis 01/14/2015  . Hyperglycemia 10/11/2014  . Esophageal reflux 09/22/2014  . Long term current use of systemic steroids - prednisone 08/25/2012  . Osteopenia 12/25/2011  . Immunosuppression - on chronic prednisone, hx of Remicade and azathioprine 07/09/2011  . Crohn's colitis ([place])   . Hypertension   . Arthritis    Past Medical History:  Diagnosis Date  . Allergy    year around allergies   . Arthritis    shoulder  . Bell's palsy   . Blood transfusion    1966  . Blood transfusion without reported diagnosis   . Cataract    bilaterally removed  . Colon polyps    adenomatous polyp June 2012  . Complication of anesthesia    slow to wake  . Crohn's colitis ([place])   . Dry eyes    left eye worst one  . GERD (gastroesophageal reflux disease)   . Headache(784.0)    hx migraines  . Hypertension   . Shortness of breath  Family History  Problem Relation Age of Onset  . Leukemia Mother   . Heart disease Mother   . Heart disease Father   . Heart attack Father   . Ovarian cancer Paternal Aunt     1/2 aunt  . Breast cancer Paternal Aunt     1/2 aunt  . Throat cancer Paternal Aunt     1/2 aunt  . Parkinsonism Brother   . Heart disease Brother   . Leukemia Maternal Grandfather   . Anesthesia problems Neg Hx   . Hypotension Neg Hx   . Malignant hyperthermia Neg Hx   . Pseudochol deficiency Neg Hx   . Colon cancer Neg Hx   . Diabetes Neg Hx   . Esophageal cancer Neg Hx   . Rectal cancer Neg Hx   . Stomach cancer Neg Hx    Past Surgical History:  Procedure Laterality Date  . ABDOMINAL HYSTERECTOMY    . APPENDECTOMY    . CATARACT EXTRACTION Bilateral    Separate dates  . CHOLECYSTECTOMY    . COLONOSCOPY  multiple  . flex sigmoidoscopy with biospy  01/17/12   . FLEXIBLE SIGMOIDOSCOPY  06/08/2011   severe Crohn's colitis to descending colon at least  . FOOT GANGLION EXCISION     left  . SHOULDER SURGERY     right  . TONSILLECTOMY     Social History   Occupational History  . retired  Retired   Social History Main Topics  . Smoking status: Former Smoker    Packs/day: 0.25    Years: 10.00    Types: Cigarettes    Quit date: 09/26/1968  . Smokeless tobacco: Never Used  . Alcohol use No     Comment: rare wine  . Drug use: No  . Sexual activity: No

## 2016-11-07 NOTE — Patient Instructions (Signed)

## 2016-11-12 ENCOUNTER — Other Ambulatory Visit: Payer: Self-pay | Admitting: Internal Medicine

## 2016-11-22 ENCOUNTER — Telehealth (INDEPENDENT_AMBULATORY_CARE_PROVIDER_SITE_OTHER): Payer: Self-pay | Admitting: Physical Medicine and Rehabilitation

## 2016-11-22 DIAGNOSIS — M5416 Radiculopathy, lumbar region: Secondary | ICD-10-CM

## 2016-11-22 NOTE — Telephone Encounter (Signed)
She would likely need surgical consultation and depending on that spinal cord stimulator trial if not surgical and likely medication trials, if no recent PT would start there

## 2016-11-22 NOTE — Telephone Encounter (Signed)
Patient has not had PT recently- ? [facility] near [place]. She would also like a referral to discuss surgical options- requesting Dr. Patrice Paradise if possible. She said she would be ok with skipping PT and going ahead with a discussion with a Psychologist, sport and exercise.

## 2016-11-23 NOTE — Telephone Encounter (Signed)
I went ahead and did a referral for physical therapy but could not find [facility] and the drop-down box. He can just make sure that we fax an order to [facility] near [place]. I also did put a order an for consultation with Dr. Rennis Harding. We also does need to make sure that referral gets to him.

## 2016-11-23 NOTE — Telephone Encounter (Signed)
Referrals faxed.

## 2016-11-27 DIAGNOSIS — M5416 Radiculopathy, lumbar region: Secondary | ICD-10-CM | POA: Diagnosis not present

## 2016-11-27 DIAGNOSIS — M4726 Other spondylosis with radiculopathy, lumbar region: Secondary | ICD-10-CM | POA: Diagnosis not present

## 2016-11-27 DIAGNOSIS — M545 Low back pain: Secondary | ICD-10-CM | POA: Diagnosis not present

## 2016-11-28 DIAGNOSIS — M4726 Other spondylosis with radiculopathy, lumbar region: Secondary | ICD-10-CM | POA: Diagnosis not present

## 2016-11-28 DIAGNOSIS — M545 Low back pain: Secondary | ICD-10-CM | POA: Diagnosis not present

## 2016-11-28 DIAGNOSIS — M5416 Radiculopathy, lumbar region: Secondary | ICD-10-CM | POA: Diagnosis not present

## 2016-11-29 DIAGNOSIS — M4726 Other spondylosis with radiculopathy, lumbar region: Secondary | ICD-10-CM | POA: Diagnosis not present

## 2016-11-29 DIAGNOSIS — M545 Low back pain: Secondary | ICD-10-CM | POA: Diagnosis not present

## 2016-11-29 DIAGNOSIS — M5416 Radiculopathy, lumbar region: Secondary | ICD-10-CM | POA: Diagnosis not present

## 2016-11-30 ENCOUNTER — Other Ambulatory Visit: Payer: Self-pay | Admitting: Internal Medicine

## 2016-12-04 DIAGNOSIS — M545 Low back pain: Secondary | ICD-10-CM | POA: Diagnosis not present

## 2016-12-04 DIAGNOSIS — M4726 Other spondylosis with radiculopathy, lumbar region: Secondary | ICD-10-CM | POA: Diagnosis not present

## 2016-12-04 DIAGNOSIS — M5416 Radiculopathy, lumbar region: Secondary | ICD-10-CM | POA: Diagnosis not present

## 2016-12-05 DIAGNOSIS — M4726 Other spondylosis with radiculopathy, lumbar region: Secondary | ICD-10-CM | POA: Diagnosis not present

## 2016-12-05 DIAGNOSIS — M5416 Radiculopathy, lumbar region: Secondary | ICD-10-CM | POA: Diagnosis not present

## 2016-12-05 DIAGNOSIS — M545 Low back pain: Secondary | ICD-10-CM | POA: Diagnosis not present

## 2016-12-06 DIAGNOSIS — M4726 Other spondylosis with radiculopathy, lumbar region: Secondary | ICD-10-CM | POA: Diagnosis not present

## 2016-12-06 DIAGNOSIS — M545 Low back pain: Secondary | ICD-10-CM | POA: Diagnosis not present

## 2016-12-06 DIAGNOSIS — M5416 Radiculopathy, lumbar region: Secondary | ICD-10-CM | POA: Diagnosis not present

## 2016-12-11 DIAGNOSIS — M4726 Other spondylosis with radiculopathy, lumbar region: Secondary | ICD-10-CM | POA: Diagnosis not present

## 2016-12-11 DIAGNOSIS — M5416 Radiculopathy, lumbar region: Secondary | ICD-10-CM | POA: Diagnosis not present

## 2016-12-11 DIAGNOSIS — M545 Low back pain: Secondary | ICD-10-CM | POA: Diagnosis not present

## 2016-12-13 DIAGNOSIS — M545 Low back pain: Secondary | ICD-10-CM | POA: Diagnosis not present

## 2016-12-13 DIAGNOSIS — M5416 Radiculopathy, lumbar region: Secondary | ICD-10-CM | POA: Diagnosis not present

## 2016-12-13 DIAGNOSIS — M4726 Other spondylosis with radiculopathy, lumbar region: Secondary | ICD-10-CM | POA: Diagnosis not present

## 2016-12-25 DIAGNOSIS — M545 Low back pain: Secondary | ICD-10-CM | POA: Diagnosis not present

## 2016-12-25 DIAGNOSIS — M5416 Radiculopathy, lumbar region: Secondary | ICD-10-CM | POA: Diagnosis not present

## 2016-12-25 DIAGNOSIS — M4726 Other spondylosis with radiculopathy, lumbar region: Secondary | ICD-10-CM | POA: Diagnosis not present

## 2016-12-27 DIAGNOSIS — M48061 Spinal stenosis, lumbar region without neurogenic claudication: Secondary | ICD-10-CM | POA: Diagnosis not present

## 2016-12-27 DIAGNOSIS — M5416 Radiculopathy, lumbar region: Secondary | ICD-10-CM | POA: Diagnosis not present

## 2016-12-27 DIAGNOSIS — M549 Dorsalgia, unspecified: Secondary | ICD-10-CM | POA: Diagnosis not present

## 2016-12-27 DIAGNOSIS — M5136 Other intervertebral disc degeneration, lumbar region: Secondary | ICD-10-CM | POA: Diagnosis not present

## 2016-12-27 DIAGNOSIS — M4316 Spondylolisthesis, lumbar region: Secondary | ICD-10-CM | POA: Diagnosis not present

## 2016-12-28 ENCOUNTER — Telehealth: Payer: Self-pay | Admitting: Internal Medicine

## 2016-12-28 ENCOUNTER — Telehealth: Payer: Self-pay

## 2016-12-28 NOTE — Telephone Encounter (Signed)
Error

## 2016-12-28 NOTE — Telephone Encounter (Signed)
Left patient a VM that she to set up a surgical clearance appointment. It needs to be 30 mintues.   You may set it up with Baldo Ash or Marya Amsler since Cr. Sharlet Salina is out of office. Thank you.

## 2016-12-28 NOTE — Telephone Encounter (Signed)
Patient has set up an appointment for Monday June 11 with Terri Piedra.

## 2016-12-31 ENCOUNTER — Ambulatory Visit (INDEPENDENT_AMBULATORY_CARE_PROVIDER_SITE_OTHER): Payer: Medicare Other | Admitting: Family

## 2016-12-31 ENCOUNTER — Other Ambulatory Visit (INDEPENDENT_AMBULATORY_CARE_PROVIDER_SITE_OTHER): Payer: Medicare Other

## 2016-12-31 ENCOUNTER — Encounter: Payer: Self-pay | Admitting: Family

## 2016-12-31 VITALS — BP 122/76 | HR 74 | Temp 98.4°F | Resp 16 | Ht 65.5 in | Wt 185.1 lb

## 2016-12-31 DIAGNOSIS — K50111 Crohn's disease of large intestine with rectal bleeding: Secondary | ICD-10-CM

## 2016-12-31 DIAGNOSIS — Z01818 Encounter for other preprocedural examination: Secondary | ICD-10-CM | POA: Diagnosis not present

## 2016-12-31 DIAGNOSIS — M5416 Radiculopathy, lumbar region: Secondary | ICD-10-CM | POA: Diagnosis not present

## 2016-12-31 DIAGNOSIS — R739 Hyperglycemia, unspecified: Secondary | ICD-10-CM

## 2016-12-31 LAB — COMPREHENSIVE METABOLIC PANEL
ALBUMIN: 4.1 g/dL (ref 3.5–5.2)
ALK PHOS: 68 U/L (ref 39–117)
ALT: 21 U/L (ref 0–35)
AST: 20 U/L (ref 0–37)
BILIRUBIN TOTAL: 0.4 mg/dL (ref 0.2–1.2)
BUN: 20 mg/dL (ref 6–23)
CO2: 29 mEq/L (ref 19–32)
CREATININE: 1.02 mg/dL (ref 0.40–1.20)
Calcium: 10.2 mg/dL (ref 8.4–10.5)
Chloride: 96 mEq/L (ref 96–112)
GFR: 56.12 mL/min — ABNORMAL LOW (ref >60.00)
Glucose, Bld: 127 mg/dL — ABNORMAL HIGH (ref 70–99)
Potassium: 5.1 mEq/L (ref 3.5–5.1)
Sodium: 133 mEq/L — ABNORMAL LOW (ref 135–145)
TOTAL PROTEIN: 7.3 g/dL (ref 6.0–8.3)

## 2016-12-31 LAB — PROTIME-INR
INR: 0.9 ratio (ref 0.8–1.0)
Prothrombin Time: 10 s (ref 9.6–13.1)

## 2016-12-31 LAB — CBC
HCT: 43.6 % (ref 36.0–46.0)
Hemoglobin: 14.4 g/dL (ref 12.0–15.0)
MCHC: 33.1 g/dL (ref 30.0–36.0)
MCV: 92.7 fl (ref 78.0–100.0)
Platelets: 478 10*3/uL — ABNORMAL HIGH (ref 150.0–400.0)
RBC: 4.7 Mil/uL (ref 3.87–5.11)
RDW: 14.5 % (ref 11.5–15.5)
WBC: 15.8 10*3/uL — AB (ref 4.0–10.5)

## 2016-12-31 LAB — HEMOGLOBIN A1C: HEMOGLOBIN A1C: 6.8 % — AB (ref 4.6–6.5)

## 2016-12-31 NOTE — Assessment & Plan Note (Signed)
Lumbar radiculopathy impacting her daily activity that has been refractory to conservative treatment with the recommendation for surgical intervention with plan for fusion.

## 2016-12-31 NOTE — Patient Instructions (Signed)
Thank you for choosing [facility].  SUMMARY AND INSTRUCTIONS:  Please continue to take your medications as prescribed.  We will check your blood work - pending abnormalities you will be cleared for surgery.   Labs:  Please stop by the lab on the lower level of the building for your blood work. Your results will be released to [facility] (or called to you) after review, usually within 72 hours after test completion. If any changes need to be made, you will be notified at that same time.  1.) The lab is open from 7:30am to 5:30 pm Monday-Friday 2.) No appointment is necessary 3.) Fasting (if needed) is 6-8 hours after food and drink; black coffee and water are okay   Follow up:  If your symptoms worsen or fail to improve, please contact our office for further instruction, or in case of emergency go directly to the emergency room at the closest medical facility.

## 2016-12-31 NOTE — Assessment & Plan Note (Addendum)
Allergies, medications, medical, and surgical histories reviewed with completed pre-operative physical exam. In office EKG completed for pre-operative clearance shows sinus rhythm with potential old infarct consistent with previous EKGs. Obtain CBC, CMET, A1c and INR/PT.   Blood work reviewed with elevated white blood cell count consistent with previous values. Hemoglobin A1c is 6.8 which is most likely associated with steroid induced. Patient is can be safely be cleared for surgery as her chronic conditions appear adequately control. May require blood sugar management secondary to steroids.

## 2016-12-31 NOTE — Progress Notes (Signed)
Subjective:    Patient ID: [name] [name], female    DOB: 03/23/1941, 76 y.o.   MRN: [id number]  Chief Complaint  Patient presents with  . Pre-op Exam    surgical clearance for back surgery    HPI:  [name] [name] is a 76 y.o. female who  has a past medical history of Allergy; Arthritis; Bell's palsy; Blood transfusion; Blood transfusion without reported diagnosis; Cataract; Colon polyps; Complication of anesthesia; Crohn's colitis ([place]); Dry eyes; GERD (gastroesophageal reflux disease); Headache(784.0); Hypertension; and Shortness of breath. and presents today for an office visit.  Continues to experience low back pain located in the left center of her back with a severity that has effected her ability to function and limits her ability to perform activities like bending and stooping. Modifying factors include failure of conservative treatments include therapy and injections. She is scheduled to undergo surgery by Dr. Patrice Paradise at [facility] for a 2 level posterior fusion.  Allergies  Allergen Reactions  . Azathioprine Nausea Only    Elevated lipase also - ? Pancreatitis   . Entyvio [Vedolizumab] Shortness Of Breath  . Adhesive [Tape] Rash    Taking skin off  . Sulfa Antibiotics Itching and Rash      Outpatient Medications Prior to Visit  Medication Sig Dispense Refill  . amLODipine (NORVASC) 5 MG tablet TAKE 1 TABLET (5 MG TOTAL) BY MOUTH AT BEDTIME. 90 tablet 0  . b complex vitamins capsule Take 1 capsule by mouth daily.      Marland Kitchen BYSTOLIC 10 MG tablet Take 1 tablet (10 mg total) by mouth daily. 90 tablet 2  . carboxymethylcellulose (REFRESH PLUS) 0.5 % SOLN Place 1 drop into both eyes 3 (three) times daily as needed (dry eyes).     . cetirizine (ZYRTEC) 10 MG tablet Take 10 mg by mouth daily as needed for allergies.     Marland Kitchen diclofenac sodium (VOLTAREN) 1 % GEL Apply 4 g topically 4 (four) times daily. 1 Tube 3  . dicyclomine (BENTYL) 20 MG tablet TAKE 1 TABLET (20  MG TOTAL) BY MOUTH 3 (THREE) TIMES DAILY. FOR STOMACH PAIN 90 tablet 0  . diphenoxylate-atropine (LOMOTIL) 2.5-0.025 MG tablet TAKE 1 TO 2 TABLETS BY MOUTH EVERY 4 HOURS AS NEEDED FOR DIARRHEA 90 tablet 0  . famotidine (PEPCID) 20 MG tablet Take 20 mg by mouth 2 (two) times daily.    . hydrochlorothiazide (HYDRODIURIL) 25 MG tablet TAKE 1 TABLET (25 MG TOTAL) BY MOUTH DAILY. 90 tablet 3  . Multiple Vitamins-Minerals (MULTIVITAMINS THER. W/MINERALS) TABS Take 1 tablet by mouth daily.      . ondansetron (ZOFRAN) 4 MG tablet Take 1 tablet (4 mg total) by mouth every 8 (eight) hours as needed for nausea or vomiting. 30 tablet 1  . pantoprazole (PROTONIX) 40 MG tablet Take 1 tablet (40 mg total) by mouth daily. 90 tablet 3  . predniSONE (DELTASONE) 10 MG tablet Take 1 tablet (10 mg total) by mouth daily. 100 tablet 1  . predniSONE (DELTASONE) 10 MG tablet TAKE AS DIRECTED. 100 tablet 1  . traMADol (ULTRAM) 50 MG tablet TAKE 1 TABLET BY MOUTH EVERY 8 HOURS AS NEEDED 60 tablet 1   No facility-administered medications prior to visit.       Past Surgical History:  Procedure Laterality Date  . ABDOMINAL HYSTERECTOMY    . APPENDECTOMY    . CATARACT EXTRACTION Bilateral    Separate dates  . CHOLECYSTECTOMY    . COLONOSCOPY  multiple  . flex sigmoidoscopy with biospy  01/17/12  . FLEXIBLE SIGMOIDOSCOPY  06/08/2011   severe Crohn's colitis to descending colon at least  . FOOT GANGLION EXCISION     left  . SHOULDER SURGERY     right  . TONSILLECTOMY        Past Medical History:  Diagnosis Date  . Allergy    year around allergies   . Arthritis    shoulder  . Bell's palsy   . Blood transfusion    1966  . Blood transfusion without reported diagnosis   . Cataract    bilaterally removed  . Colon polyps    adenomatous polyp June 2012  . Complication of anesthesia    slow to wake  . Crohn's colitis ([place])   . Dry eyes    left eye worst one  . GERD (gastroesophageal reflux disease)   .  Headache(784.0)    hx migraines  . Hypertension   . Shortness of breath       Review of Systems  Constitutional: Denies fever, chills, fatigue, or significant weight gain/loss. HENT: Head: Denies headache or neck pain Ears: Denies changes in hearing, ringing in ears, earache, drainage Nose: Denies discharge, stuffiness, itching, nosebleed, sinus pain Throat: Denies sore throat, hoarseness, dry mouth, sores, thrush Eyes: Denies loss/changes in vision, pain, redness, blurry/double vision, flashing lights Cardiovascular: Denies chest pain/discomfort, tightness, palpitations, shortness of breath with activity, difficulty lying down, swelling, sudden awakening with shortness of breath Respiratory: Denies shortness of breath, cough, sputum production, wheezing Gastrointestinal: Denies dysphasia, heartburn, change in appetite, nausea, change in bowel habits, rectal bleeding, constipation, diarrhea, yellow skin or eyes Genitourinary: Denies frequency, urgency, burning/pain, blood in urine, incontinence, change in urinary strength. Musculoskeletal: Denies muscle/joint pain, stiffness, redness or swelling of joints, trauma Positive for back pain Skin: Denies rashes, lumps, itching, dryness, color changes, or hair/nail changes Neurological: Denies dizziness, fainting, seizures, weakness, numbness, tingling, tremor Psychiatric - Denies nervousness, stress, depression or memory loss Endocrine: Denies heat or cold intolerance, sweating, frequent urination, excessive thirst, changes in appetite Hematologic: Denies ease of bruising or bleeding      Objective:    BP 122/76 (BP Location: Left Arm, Patient Position: Sitting, Cuff Size: Large)   Pulse 74   Temp 98.4 F (36.9 C) (Oral)   Resp 16   Ht 5' 5.5" (1.664 m)   Wt 185 lb 1.9 oz (84 kg)   SpO2 96%   BMI 30.34 kg/m  Nursing note and vital signs reviewed.  Physical Exam  Constitutional: She is oriented to person, place, and time. She  appears well-developed and well-nourished.  HENT:  Head: Normocephalic.  Right Ear: Hearing, tympanic membrane, external ear and ear canal normal.  Left Ear: Hearing, tympanic membrane, external ear and ear canal normal.  Nose: Nose normal.  Mouth/Throat: Uvula is midline, oropharynx is clear and moist and mucous membranes are normal.  Eyes: Conjunctivae and EOM are normal. Pupils are equal, round, and reactive to light.  Neck: Neck supple. No JVD present. No tracheal deviation present. No thyromegaly present.  Cardiovascular: Normal rate, regular rhythm, normal heart sounds and intact distal pulses.   Pulmonary/Chest: Effort normal and breath sounds normal.  Abdominal: Soft. Bowel sounds are normal. She exhibits no distension and no mass. There is no tenderness. There is no rebound and no guarding.  Musculoskeletal: Normal range of motion. She exhibits no edema or tenderness.  Lymphadenopathy:    She has no cervical adenopathy.  Neurological: She is  alert and oriented to person, place, and time. She has normal reflexes. No cranial nerve deficit. She exhibits normal muscle tone. Coordination normal.  Skin: Skin is warm and dry.  Psychiatric: She has a normal mood and affect. Her behavior is normal. Judgment and thought content normal.       Assessment & Plan:   Problem List Items Addressed This Visit      Digestive   Crohn's colitis ([place])   Relevant Orders   INR/PT (Completed)     Nervous and Auditory   Lumbar radiculopathy - Primary    Lumbar radiculopathy impacting her daily activity that has been refractory to conservative treatment with the recommendation for surgical intervention with plan for fusion.       Relevant Medications   gabapentin (NEURONTIN) 300 MG capsule   Other Relevant Orders   CBC (Completed)   Comprehensive metabolic panel (Completed)     Other   Hyperglycemia   Relevant Orders   Hemoglobin A1c (Completed)   Pre-op exam    Allergies, medications,  medical, and surgical histories reviewed with completed pre-operative physical exam. In office EKG completed for pre-operative clearance shows sinus rhythm with potential old infarct consistent with previous EKGs. Obtain CBC, CMET, A1c and INR/PT.   Blood work reviewed with elevated white blood cell count consistent with previous values. Hemoglobin A1c is 6.8 which is most likely associated with steroid induced. Patient is can be safely be cleared for surgery as her chronic conditions appear adequately control. May require blood sugar management secondary to steroids.       Relevant Orders   EKG 12-Lead (Completed)   CBC (Completed)   Comprehensive metabolic panel (Completed)       I am having Ms. Breslin maintain her cetirizine, b complex vitamins, multivitamins ther. w/minerals, carboxymethylcellulose, pantoprazole, famotidine, ondansetron, diclofenac sodium, hydrochlorothiazide, BYSTOLIC, predniSONE, diphenoxylate-atropine, predniSONE, dicyclomine, traMADol, amLODipine, and gabapentin.   Meds ordered this encounter  Medications  . gabapentin (NEURONTIN) 300 MG capsule    Sig: Take 300 mg by mouth 3 (three) times daily.     Follow-up: Return if symptoms worsen or fail to improve.  Mauricio Po, FNP

## 2017-01-04 DIAGNOSIS — Z4689 Encounter for fitting and adjustment of other specified devices: Secondary | ICD-10-CM | POA: Diagnosis not present

## 2017-01-04 DIAGNOSIS — M48061 Spinal stenosis, lumbar region without neurogenic claudication: Secondary | ICD-10-CM | POA: Diagnosis not present

## 2017-01-04 DIAGNOSIS — M4316 Spondylolisthesis, lumbar region: Secondary | ICD-10-CM | POA: Diagnosis not present

## 2017-01-04 DIAGNOSIS — M5136 Other intervertebral disc degeneration, lumbar region: Secondary | ICD-10-CM | POA: Diagnosis not present

## 2017-01-04 DIAGNOSIS — M4716 Other spondylosis with myelopathy, lumbar region: Secondary | ICD-10-CM | POA: Diagnosis not present

## 2017-01-08 DIAGNOSIS — M4316 Spondylolisthesis, lumbar region: Secondary | ICD-10-CM | POA: Diagnosis not present

## 2017-01-08 DIAGNOSIS — Z01818 Encounter for other preprocedural examination: Secondary | ICD-10-CM | POA: Diagnosis not present

## 2017-01-08 DIAGNOSIS — Z5189 Encounter for other specified aftercare: Secondary | ICD-10-CM | POA: Diagnosis not present

## 2017-01-14 DIAGNOSIS — K219 Gastro-esophageal reflux disease without esophagitis: Secondary | ICD-10-CM | POA: Diagnosis present

## 2017-01-14 DIAGNOSIS — I10 Essential (primary) hypertension: Secondary | ICD-10-CM | POA: Diagnosis present

## 2017-01-14 DIAGNOSIS — M48061 Spinal stenosis, lumbar region without neurogenic claudication: Secondary | ICD-10-CM | POA: Diagnosis not present

## 2017-01-14 DIAGNOSIS — K5090 Crohn's disease, unspecified, without complications: Secondary | ICD-10-CM | POA: Diagnosis present

## 2017-01-14 DIAGNOSIS — M4326 Fusion of spine, lumbar region: Secondary | ICD-10-CM | POA: Diagnosis not present

## 2017-01-14 DIAGNOSIS — M6281 Muscle weakness (generalized): Secondary | ICD-10-CM | POA: Diagnosis not present

## 2017-01-14 DIAGNOSIS — Z5189 Encounter for other specified aftercare: Secondary | ICD-10-CM | POA: Diagnosis not present

## 2017-01-14 DIAGNOSIS — G43909 Migraine, unspecified, not intractable, without status migrainosus: Secondary | ICD-10-CM | POA: Diagnosis present

## 2017-01-14 DIAGNOSIS — M4716 Other spondylosis with myelopathy, lumbar region: Secondary | ICD-10-CM | POA: Diagnosis not present

## 2017-01-14 DIAGNOSIS — M4327 Fusion of spine, lumbosacral region: Secondary | ICD-10-CM | POA: Diagnosis not present

## 2017-01-14 DIAGNOSIS — M5136 Other intervertebral disc degeneration, lumbar region: Secondary | ICD-10-CM | POA: Diagnosis not present

## 2017-01-14 DIAGNOSIS — Z87891 Personal history of nicotine dependence: Secondary | ICD-10-CM | POA: Diagnosis not present

## 2017-01-14 DIAGNOSIS — Z741 Need for assistance with personal care: Secondary | ICD-10-CM | POA: Diagnosis not present

## 2017-01-14 DIAGNOSIS — M545 Low back pain: Secondary | ICD-10-CM | POA: Diagnosis not present

## 2017-01-14 DIAGNOSIS — M5416 Radiculopathy, lumbar region: Secondary | ICD-10-CM | POA: Diagnosis not present

## 2017-01-14 DIAGNOSIS — M1990 Unspecified osteoarthritis, unspecified site: Secondary | ICD-10-CM | POA: Diagnosis not present

## 2017-01-14 DIAGNOSIS — M4316 Spondylolisthesis, lumbar region: Secondary | ICD-10-CM | POA: Diagnosis not present

## 2017-01-14 DIAGNOSIS — R2681 Unsteadiness on feet: Secondary | ICD-10-CM | POA: Diagnosis not present

## 2017-01-14 DIAGNOSIS — M47816 Spondylosis without myelopathy or radiculopathy, lumbar region: Secondary | ICD-10-CM | POA: Diagnosis not present

## 2017-01-14 DIAGNOSIS — M549 Dorsalgia, unspecified: Secondary | ICD-10-CM | POA: Diagnosis not present

## 2017-01-14 DIAGNOSIS — S3992XA Unspecified injury of lower back, initial encounter: Secondary | ICD-10-CM | POA: Diagnosis not present

## 2017-01-14 DIAGNOSIS — M5106 Intervertebral disc disorders with myelopathy, lumbar region: Secondary | ICD-10-CM | POA: Diagnosis present

## 2017-01-18 DIAGNOSIS — Z5189 Encounter for other specified aftercare: Secondary | ICD-10-CM | POA: Diagnosis not present

## 2017-01-18 DIAGNOSIS — M1990 Unspecified osteoarthritis, unspecified site: Secondary | ICD-10-CM | POA: Diagnosis not present

## 2017-01-18 DIAGNOSIS — R2681 Unsteadiness on feet: Secondary | ICD-10-CM | POA: Diagnosis not present

## 2017-01-18 DIAGNOSIS — Z981 Arthrodesis status: Secondary | ICD-10-CM | POA: Diagnosis not present

## 2017-01-18 DIAGNOSIS — K529 Noninfective gastroenteritis and colitis, unspecified: Secondary | ICD-10-CM | POA: Diagnosis not present

## 2017-01-18 DIAGNOSIS — M4326 Fusion of spine, lumbar region: Secondary | ICD-10-CM | POA: Diagnosis not present

## 2017-01-18 DIAGNOSIS — M545 Low back pain: Secondary | ICD-10-CM | POA: Diagnosis not present

## 2017-01-18 DIAGNOSIS — Z741 Need for assistance with personal care: Secondary | ICD-10-CM | POA: Diagnosis not present

## 2017-01-18 DIAGNOSIS — M4327 Fusion of spine, lumbosacral region: Secondary | ICD-10-CM | POA: Diagnosis not present

## 2017-01-18 DIAGNOSIS — I10 Essential (primary) hypertension: Secondary | ICD-10-CM | POA: Diagnosis not present

## 2017-01-18 DIAGNOSIS — G43909 Migraine, unspecified, not intractable, without status migrainosus: Secondary | ICD-10-CM | POA: Diagnosis not present

## 2017-01-18 DIAGNOSIS — M549 Dorsalgia, unspecified: Secondary | ICD-10-CM | POA: Diagnosis not present

## 2017-01-18 DIAGNOSIS — M6281 Muscle weakness (generalized): Secondary | ICD-10-CM | POA: Diagnosis not present

## 2017-01-18 DIAGNOSIS — S3992XA Unspecified injury of lower back, initial encounter: Secondary | ICD-10-CM | POA: Diagnosis not present

## 2017-01-24 DIAGNOSIS — K529 Noninfective gastroenteritis and colitis, unspecified: Secondary | ICD-10-CM | POA: Diagnosis not present

## 2017-01-24 DIAGNOSIS — G43909 Migraine, unspecified, not intractable, without status migrainosus: Secondary | ICD-10-CM | POA: Diagnosis not present

## 2017-01-24 DIAGNOSIS — Z981 Arthrodesis status: Secondary | ICD-10-CM | POA: Diagnosis not present

## 2017-01-24 DIAGNOSIS — I10 Essential (primary) hypertension: Secondary | ICD-10-CM | POA: Diagnosis not present

## 2017-01-31 DIAGNOSIS — K529 Noninfective gastroenteritis and colitis, unspecified: Secondary | ICD-10-CM | POA: Diagnosis not present

## 2017-01-31 DIAGNOSIS — G43909 Migraine, unspecified, not intractable, without status migrainosus: Secondary | ICD-10-CM | POA: Diagnosis not present

## 2017-01-31 DIAGNOSIS — Z981 Arthrodesis status: Secondary | ICD-10-CM | POA: Diagnosis not present

## 2017-01-31 DIAGNOSIS — I10 Essential (primary) hypertension: Secondary | ICD-10-CM | POA: Diagnosis not present

## 2017-02-06 DIAGNOSIS — M549 Dorsalgia, unspecified: Secondary | ICD-10-CM | POA: Diagnosis not present

## 2017-02-08 ENCOUNTER — Other Ambulatory Visit: Payer: Self-pay

## 2017-02-25 ENCOUNTER — Other Ambulatory Visit: Payer: Self-pay | Admitting: Internal Medicine

## 2017-02-27 ENCOUNTER — Other Ambulatory Visit: Payer: Self-pay | Admitting: Internal Medicine

## 2017-03-19 ENCOUNTER — Other Ambulatory Visit: Payer: Self-pay | Admitting: Internal Medicine

## 2017-03-20 ENCOUNTER — Telehealth: Payer: Self-pay | Admitting: Internal Medicine

## 2017-03-20 NOTE — Telephone Encounter (Signed)
Spoke with pt regarding AWV. Pt stated that she just had back surgery and will have to wait until she gets clearance to drive before she can make an appt. Last AWV on 03/11/14, appt can be scheduled at anytime.

## 2017-03-23 ENCOUNTER — Other Ambulatory Visit: Payer: Self-pay | Admitting: Internal Medicine

## 2017-04-03 ENCOUNTER — Other Ambulatory Visit: Payer: Self-pay | Admitting: Internal Medicine

## 2017-04-03 DIAGNOSIS — M545 Low back pain: Secondary | ICD-10-CM | POA: Diagnosis not present

## 2017-04-12 DIAGNOSIS — M25551 Pain in right hip: Secondary | ICD-10-CM | POA: Diagnosis not present

## 2017-04-23 ENCOUNTER — Other Ambulatory Visit: Payer: Self-pay | Admitting: Internal Medicine

## 2017-04-23 DIAGNOSIS — M4326 Fusion of spine, lumbar region: Secondary | ICD-10-CM | POA: Diagnosis not present

## 2017-04-23 DIAGNOSIS — M4716 Other spondylosis with myelopathy, lumbar region: Secondary | ICD-10-CM | POA: Diagnosis not present

## 2017-04-23 DIAGNOSIS — M545 Low back pain: Secondary | ICD-10-CM | POA: Diagnosis not present

## 2017-04-25 ENCOUNTER — Other Ambulatory Visit: Payer: Self-pay | Admitting: Orthopaedic Surgery

## 2017-04-25 DIAGNOSIS — M546 Pain in thoracic spine: Secondary | ICD-10-CM

## 2017-04-26 ENCOUNTER — Ambulatory Visit
Admission: RE | Admit: 2017-04-26 | Discharge: 2017-04-26 | Disposition: A | Discharge status: Home / Self Care | Payer: Medicare Other | Source: Ambulatory Visit | Attending: Orthopaedic Surgery | Admitting: Orthopaedic Surgery

## 2017-04-26 DIAGNOSIS — S22060A Wedge compression fracture of T7-T8 vertebra, initial encounter for closed fracture: Secondary | ICD-10-CM | POA: Diagnosis not present

## 2017-04-26 DIAGNOSIS — M546 Pain in thoracic spine: Secondary | ICD-10-CM

## 2017-05-06 DIAGNOSIS — S22070A Wedge compression fracture of T9-T10 vertebra, initial encounter for closed fracture: Secondary | ICD-10-CM | POA: Diagnosis not present

## 2017-05-06 DIAGNOSIS — M8008XA Age-related osteoporosis with current pathological fracture, vertebra(e), initial encounter for fracture: Secondary | ICD-10-CM | POA: Diagnosis not present

## 2017-05-06 DIAGNOSIS — S22060A Wedge compression fracture of T7-T8 vertebra, initial encounter for closed fracture: Secondary | ICD-10-CM | POA: Diagnosis not present

## 2017-05-17 ENCOUNTER — Other Ambulatory Visit: Payer: Self-pay | Admitting: Internal Medicine

## 2017-05-27 DIAGNOSIS — M546 Pain in thoracic spine: Secondary | ICD-10-CM | POA: Diagnosis not present

## 2017-07-03 ENCOUNTER — Other Ambulatory Visit: Payer: Self-pay | Admitting: Internal Medicine

## 2017-07-03 DIAGNOSIS — M4326 Fusion of spine, lumbar region: Secondary | ICD-10-CM | POA: Diagnosis not present

## 2017-07-03 DIAGNOSIS — M545 Low back pain: Secondary | ICD-10-CM | POA: Diagnosis not present

## 2017-07-03 DIAGNOSIS — M546 Pain in thoracic spine: Secondary | ICD-10-CM | POA: Diagnosis not present

## 2017-07-03 DIAGNOSIS — M5136 Other intervertebral disc degeneration, lumbar region: Secondary | ICD-10-CM | POA: Diagnosis not present

## 2017-07-06 ENCOUNTER — Other Ambulatory Visit: Payer: Self-pay | Admitting: Internal Medicine

## 2017-07-31 ENCOUNTER — Other Ambulatory Visit: Payer: Self-pay | Admitting: Internal Medicine

## 2017-08-27 DIAGNOSIS — M4716 Other spondylosis with myelopathy, lumbar region: Secondary | ICD-10-CM | POA: Diagnosis not present

## 2017-08-27 DIAGNOSIS — M545 Low back pain: Secondary | ICD-10-CM | POA: Diagnosis not present

## 2017-08-27 DIAGNOSIS — M4326 Fusion of spine, lumbar region: Secondary | ICD-10-CM | POA: Diagnosis not present

## 2017-08-27 DIAGNOSIS — M5136 Other intervertebral disc degeneration, lumbar region: Secondary | ICD-10-CM | POA: Diagnosis not present

## 2017-09-03 DIAGNOSIS — M545 Low back pain: Secondary | ICD-10-CM | POA: Diagnosis not present

## 2017-09-03 DIAGNOSIS — M4326 Fusion of spine, lumbar region: Secondary | ICD-10-CM | POA: Diagnosis not present

## 2017-09-04 DIAGNOSIS — M545 Low back pain: Secondary | ICD-10-CM | POA: Diagnosis not present

## 2017-09-04 DIAGNOSIS — M4326 Fusion of spine, lumbar region: Secondary | ICD-10-CM | POA: Diagnosis not present

## 2017-09-06 DIAGNOSIS — M4326 Fusion of spine, lumbar region: Secondary | ICD-10-CM | POA: Diagnosis not present

## 2017-09-06 DIAGNOSIS — M545 Low back pain: Secondary | ICD-10-CM | POA: Diagnosis not present

## 2017-09-10 DIAGNOSIS — M545 Low back pain: Secondary | ICD-10-CM | POA: Diagnosis not present

## 2017-09-10 DIAGNOSIS — M4326 Fusion of spine, lumbar region: Secondary | ICD-10-CM | POA: Diagnosis not present

## 2017-09-12 DIAGNOSIS — M4326 Fusion of spine, lumbar region: Secondary | ICD-10-CM | POA: Diagnosis not present

## 2017-09-12 DIAGNOSIS — M545 Low back pain: Secondary | ICD-10-CM | POA: Diagnosis not present

## 2017-09-17 DIAGNOSIS — M545 Low back pain: Secondary | ICD-10-CM | POA: Diagnosis not present

## 2017-09-17 DIAGNOSIS — M4326 Fusion of spine, lumbar region: Secondary | ICD-10-CM | POA: Diagnosis not present

## 2017-09-19 DIAGNOSIS — M545 Low back pain: Secondary | ICD-10-CM | POA: Diagnosis not present

## 2017-09-19 DIAGNOSIS — M4326 Fusion of spine, lumbar region: Secondary | ICD-10-CM | POA: Diagnosis not present

## 2017-09-24 DIAGNOSIS — M545 Low back pain: Secondary | ICD-10-CM | POA: Diagnosis not present

## 2017-09-24 DIAGNOSIS — M4326 Fusion of spine, lumbar region: Secondary | ICD-10-CM | POA: Diagnosis not present

## 2017-09-26 DIAGNOSIS — M4326 Fusion of spine, lumbar region: Secondary | ICD-10-CM | POA: Diagnosis not present

## 2017-09-26 DIAGNOSIS — M545 Low back pain: Secondary | ICD-10-CM | POA: Diagnosis not present

## 2017-10-01 DIAGNOSIS — M545 Low back pain: Secondary | ICD-10-CM | POA: Diagnosis not present

## 2017-10-01 DIAGNOSIS — M4326 Fusion of spine, lumbar region: Secondary | ICD-10-CM | POA: Diagnosis not present

## 2017-10-03 DIAGNOSIS — M4326 Fusion of spine, lumbar region: Secondary | ICD-10-CM | POA: Diagnosis not present

## 2017-10-03 DIAGNOSIS — M545 Low back pain: Secondary | ICD-10-CM | POA: Diagnosis not present

## 2017-10-08 DIAGNOSIS — M545 Low back pain: Secondary | ICD-10-CM | POA: Diagnosis not present

## 2017-10-08 DIAGNOSIS — M4326 Fusion of spine, lumbar region: Secondary | ICD-10-CM | POA: Diagnosis not present

## 2017-10-10 DIAGNOSIS — M545 Low back pain: Secondary | ICD-10-CM | POA: Diagnosis not present

## 2017-10-10 DIAGNOSIS — M4326 Fusion of spine, lumbar region: Secondary | ICD-10-CM | POA: Diagnosis not present

## 2017-10-24 ENCOUNTER — Other Ambulatory Visit: Payer: Self-pay | Admitting: Internal Medicine

## 2017-10-24 NOTE — Telephone Encounter (Signed)
May I refill Sir?

## 2017-10-31 ENCOUNTER — Other Ambulatory Visit (INDEPENDENT_AMBULATORY_CARE_PROVIDER_SITE_OTHER): Payer: Medicare Other

## 2017-10-31 ENCOUNTER — Ambulatory Visit (INDEPENDENT_AMBULATORY_CARE_PROVIDER_SITE_OTHER): Payer: Medicare Other | Admitting: Internal Medicine

## 2017-10-31 ENCOUNTER — Encounter: Payer: Self-pay | Admitting: Internal Medicine

## 2017-10-31 VITALS — BP 130/88 | HR 97 | Temp 97.8°F | Ht 65.5 in | Wt 171.0 lb

## 2017-10-31 DIAGNOSIS — R7301 Impaired fasting glucose: Secondary | ICD-10-CM | POA: Diagnosis not present

## 2017-10-31 DIAGNOSIS — I10 Essential (primary) hypertension: Secondary | ICD-10-CM

## 2017-10-31 DIAGNOSIS — R413 Other amnesia: Secondary | ICD-10-CM

## 2017-10-31 DIAGNOSIS — R5383 Other fatigue: Secondary | ICD-10-CM

## 2017-10-31 DIAGNOSIS — E538 Deficiency of other specified B group vitamins: Secondary | ICD-10-CM

## 2017-10-31 DIAGNOSIS — E559 Vitamin D deficiency, unspecified: Secondary | ICD-10-CM

## 2017-10-31 LAB — COMPREHENSIVE METABOLIC PANEL
ALBUMIN: 3.7 g/dL (ref 3.5–5.2)
ALK PHOS: 94 U/L (ref 39–117)
ALT: 10 U/L (ref 0–35)
AST: 13 U/L (ref 0–37)
BILIRUBIN TOTAL: 0.3 mg/dL (ref 0.2–1.2)
BUN: 12 mg/dL (ref 6–23)
CO2: 27 mEq/L (ref 19–32)
Calcium: 9.3 mg/dL (ref 8.4–10.5)
Chloride: 99 mEq/L (ref 96–112)
Creatinine, Ser: 0.96 mg/dL (ref 0.40–1.20)
GFR: 60.05 mL/min (ref >60.00)
Glucose, Bld: 140 mg/dL — ABNORMAL HIGH (ref 70–99)
POTASSIUM: 3.8 meq/L (ref 3.5–5.1)
Sodium: 134 mEq/L — ABNORMAL LOW (ref 135–145)
TOTAL PROTEIN: 7.5 g/dL (ref 6.0–8.3)

## 2017-10-31 LAB — CBC
HEMATOCRIT: 40.6 % (ref 36.0–46.0)
HEMOGLOBIN: 13 g/dL (ref 12.0–15.0)
MCHC: 32.1 g/dL (ref 30.0–36.0)
MCV: 82.4 fl (ref 78.0–100.0)
PLATELETS: 724 10*3/uL — AB (ref 150.0–400.0)
RBC: 4.93 Mil/uL (ref 3.87–5.11)
RDW: 15.8 % — ABNORMAL HIGH (ref 11.5–15.5)
WBC: 14.3 10*3/uL — AB (ref 4.0–10.5)

## 2017-10-31 LAB — LIPID PANEL
CHOLESTEROL: 228 mg/dL — AB (ref 0–200)
HDL: 52 mg/dL (ref >39.00)
NonHDL: 175.63
Total CHOL/HDL Ratio: 4
Triglycerides: 221 mg/dL — ABNORMAL HIGH (ref 0.0–149.0)
VLDL: 44.2 mg/dL — ABNORMAL HIGH (ref 0.0–40.0)

## 2017-10-31 LAB — VITAMIN B12: VITAMIN B 12: 1064 pg/mL — AB (ref 211–911)

## 2017-10-31 LAB — VITAMIN D 25 HYDROXY (VIT D DEFICIENCY, FRACTURES): VITD: 24.11 ng/mL — AB (ref 30.00–100.00)

## 2017-10-31 LAB — TSH: TSH: 0.53 u[IU]/mL (ref 0.35–4.50)

## 2017-10-31 LAB — HEMOGLOBIN A1C: Hgb A1c MFr Bld: 6.5 % (ref 4.6–6.5)

## 2017-10-31 LAB — LDL CHOLESTEROL, DIRECT: LDL DIRECT: 156 mg/dL

## 2017-10-31 NOTE — Patient Instructions (Signed)
We will refill the bystolic.   We are checking the labs today.  Use a pill pack to keep track of the medicines.   Memory Compensation Strategies  1. Use "WARM" strategy.  W= write it down  A= associate it  R= repeat it  M= make a mental note  2.   You can keep a Social worker.  Use a 3-ring notebook with sections for the following: calendar, important names and phone numbers,  medications, doctors' names/phone numbers, lists/reminders, and a section to journal what you did  each day.   3.    Use a calendar to write appointments down.  4.    Write yourself a schedule for the day.  This can be placed on the calendar or in a separate section of the Memory Notebook.  Keeping a  regular schedule can help memory.  5.    Use medication organizer with sections for each day or morning/evening pills.  You may need help loading it  6.    Keep a basket, or pegboard by the door.  Place items that you need to take out with you in the basket or on the pegboard.  You may also want to  include a message board for reminders.  7.    Use sticky notes.  Place sticky notes with reminders in a place where the task is performed.  For example: " turn off the  stove" placed by the stove, "lock the door" placed on the door at eye level, " take your medications" on  the bathroom mirror or by the place where you normally take your medications.  8.    Use alarms/timers.  Use while cooking to remind yourself to check on food or as a reminder to take your medicine, or as a  reminder to make a call, or as a reminder to perform another task, etc.

## 2017-10-31 NOTE — Progress Notes (Signed)
   Subjective:    Patient ID: [name] [name], female    DOB: Oct 08, 1940, 77 y.o.   MRN: [id number]  HPI The patient is a 77 YO female coming in with her sister for conerns about memory. She feels like she is forgetting things sometimes. Maybe a word or where something is placed. She denies leaving stove on, problems with directions, problems with bills. She denies depression or anxiety. Sleeping well. Denies guilt or worry. She does not know which medications she is taking right now. Some of her medications have been without refills for some time as her last well visit was about 1.5 years ago. Denies infection symptoms. Denies weight change.   Review of Systems  Constitutional: Negative.   HENT: Negative.   Eyes: Negative.   Respiratory: Negative for cough, chest tightness and shortness of breath.   Cardiovascular: Negative for chest pain, palpitations and leg swelling.  Gastrointestinal: Negative for abdominal distention, abdominal pain, constipation, diarrhea, nausea and vomiting.  Musculoskeletal: Negative.   Skin: Negative.   Neurological: Negative.   Psychiatric/Behavioral: Negative.       Objective:   Physical Exam  Constitutional: She is oriented to person, place, and time. She appears well-developed and well-nourished.  HENT:  Head: Normocephalic and atraumatic.  Eyes: EOM are normal.  Neck: Normal range of motion.  Cardiovascular: Normal rate and regular rhythm.  Pulmonary/Chest: Effort normal and breath sounds normal. No respiratory distress. She has no wheezes. She has no rales.  Abdominal: Soft. Bowel sounds are normal. She exhibits no distension. There is no tenderness. There is no rebound.  Musculoskeletal: She exhibits no edema.  Neurological: She is alert and oriented to person, place, and time. Coordination normal.  MMSE 30  Skin: Skin is warm and dry.  Psychiatric: She has a normal mood and affect.   Vitals:   10/31/17 1300  BP: 130/88  Pulse: 97  Temp: 97.8  F (36.6 C)  TempSrc: Oral  SpO2: 97%  Weight: 171 lb (77.6 kg)  Height: 5' 5.5" (1.664 m)      Assessment & Plan:

## 2017-11-01 DIAGNOSIS — F0390 Unspecified dementia without behavioral disturbance: Secondary | ICD-10-CM | POA: Insufficient documentation

## 2017-11-01 DIAGNOSIS — R413 Other amnesia: Principal | ICD-10-CM

## 2017-11-01 NOTE — Assessment & Plan Note (Signed)
She is likely only taking bystolic at this time so will remove others from list instead of refilling today. If needed can resume in the future. Advised to get a pill box and fill weekly or every other week depending on size to help with remembering meds.

## 2017-11-01 NOTE — Assessment & Plan Note (Signed)
Checking for reversible causes with labs. 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 No signs of depression or mood problem causing memory change. Advised of memory compensation strategies and asked to use a pill box for medications.

## 2017-11-04 ENCOUNTER — Encounter: Payer: Self-pay | Admitting: Gastroenterology

## 2017-11-04 ENCOUNTER — Ambulatory Visit (INDEPENDENT_AMBULATORY_CARE_PROVIDER_SITE_OTHER): Payer: Medicare Other | Admitting: Gastroenterology

## 2017-11-04 ENCOUNTER — Other Ambulatory Visit: Payer: Self-pay | Admitting: *Deleted

## 2017-11-04 ENCOUNTER — Other Ambulatory Visit: Payer: Self-pay | Admitting: Internal Medicine

## 2017-11-04 ENCOUNTER — Telehealth: Payer: Self-pay | Admitting: Gastroenterology

## 2017-11-04 ENCOUNTER — Telehealth: Payer: Self-pay | Admitting: *Deleted

## 2017-11-04 VITALS — BP 140/80 | HR 84 | Ht 65.5 in | Wt 171.8 lb

## 2017-11-04 DIAGNOSIS — K50111 Crohn's disease of large intestine with rectal bleeding: Secondary | ICD-10-CM | POA: Diagnosis not present

## 2017-11-04 DIAGNOSIS — K625 Hemorrhage of anus and rectum: Secondary | ICD-10-CM

## 2017-11-04 DIAGNOSIS — R7989 Other specified abnormal findings of blood chemistry: Secondary | ICD-10-CM

## 2017-11-04 MED ORDER — MESALAMINE 4 G RE ENEM: ENEMA | RECTAL | 1 refills | Status: DC

## 2017-11-04 MED ORDER — MESALAMINE 1000 MG RE SUPP: 1000.0000 mg | Freq: Every day | RECTAL | 1 refills | Status: DC

## 2017-11-04 NOTE — Progress Notes (Addendum)
11/04/2017 [name] [name] [id number] 1940-11-18   HISTORY OF PRESENT ILLNESS:  This is a 77 year old female who is a patient of Dr. Celesta Aver.  She has Crohn's colitis for years but has only been maintained on prednisone 10 mg daily.  She has been intolerant to other medications or they did not help.  She comes in today complaining of rectal bleeding.  Says that she has intermittent diarrhea but not consistently.  Some lower abdominal cramping on and off.  Does report that joints feel achy.  Increased her prednisone to 20 mg daily about 2 weeks ago but still has not noticed resolution of her symptoms.     Past Medical History:  Diagnosis Date  . Allergy    year around allergies   . Arthritis    shoulder  . Bell's palsy   . Blood transfusion    1966  . Blood transfusion without reported diagnosis   . Cataract    bilaterally removed  . Colon polyps    adenomatous polyp June 2012  . Complication of anesthesia    slow to wake  . Crohn's colitis ([place])   . Dry eyes    left eye worst one  . GERD (gastroesophageal reflux disease)   . Headache(784.0)    hx migraines  . Hypertension   . Shortness of breath    Past Surgical History:  Procedure Laterality Date  . ABDOMINAL HYSTERECTOMY    . APPENDECTOMY    . CATARACT EXTRACTION Bilateral    Separate dates  . CHOLECYSTECTOMY    . COLONOSCOPY  multiple  . flex sigmoidoscopy with biospy  01/17/12  . FLEXIBLE SIGMOIDOSCOPY  06/08/2011   severe Crohn's colitis to descending colon at least  . FOOT GANGLION EXCISION     left  . SHOULDER SURGERY     right  . TONSILLECTOMY      reports that she quit smoking about 49 years ago. Her smoking use included cigarettes. She has a 2.50 pack-year smoking history. She has never used smokeless tobacco. She reports that she does not drink alcohol or use drugs. family history includes Breast cancer in her paternal aunt; Heart attack in her father; Heart disease in her brother, father, and  mother; Leukemia in her maternal grandfather and mother; Ovarian cancer in her paternal aunt; Parkinsonism in her brother; Throat cancer in her paternal aunt. Allergies  Allergen Reactions  . Azathioprine Nausea Only    Elevated lipase also - ? Pancreatitis   . Entyvio [Vedolizumab] Shortness Of Breath  . Adhesive [Tape] Rash    Taking skin off  . Sulfa Antibiotics Itching and Rash      Outpatient Encounter Medications as of 11/04/2017  Medication Sig  . b complex vitamins capsule Take 1 capsule by mouth daily.    Marland Kitchen BYSTOLIC 10 MG tablet Take 1 tablet (10 mg total) by mouth daily. Annual appt is due must see provider for future refills  . cetirizine (ZYRTEC) 10 MG tablet Take 10 mg by mouth daily as needed for allergies.   Marland Kitchen dicyclomine (BENTYL) 20 MG tablet TAKE 1 TABLET (20 MG TOTAL) BY MOUTH 3 (THREE) TIMES DAILY. FOR STOMACH PAIN  . diphenoxylate-atropine (LOMOTIL) 2.5-0.025 MG tablet TAKE 1 TO 2 TABLETS BY MOUTH EVERY 4 HOURS AS NEEDED FOR DIARRHEA  . famotidine (PEPCID) 20 MG tablet Take 20 mg by mouth 2 (two) times daily.  Marland Kitchen gabapentin (NEURONTIN) 300 MG capsule Take 300 mg by mouth 3 (three) times daily.  Marland Kitchen  Multiple Vitamins-Minerals (MULTIVITAMINS THER. W/MINERALS) TABS Take 1 tablet by mouth daily as needed.   . ondansetron (ZOFRAN) 4 MG tablet Take 1 tablet (4 mg total) by mouth every 8 (eight) hours as needed for nausea or vomiting.  . pantoprazole (PROTONIX) 40 MG tablet Take 1 tablet (40 mg total) by mouth daily.  . predniSONE (DELTASONE) 10 MG tablet Take 1 tablet (10 mg total) by mouth daily.  . traMADol (ULTRAM) 50 MG tablet TAKE 1 TABLET BY MOUTH EVERY 8 HOURS AS NEEDED   No facility-administered encounter medications on file as of 11/04/2017.      REVIEW OF SYSTEMS  : All other systems reviewed and negative except where noted in the History of Present Illness.   PHYSICAL EXAM: BP 140/80   Pulse 84   Ht 5' 5.5" (1.664 m)   Wt 171 lb 12.8 oz (77.9 kg)   BMI  28.15 kg/m  General: Well developed white female in no acute distress Head: Normocephalic and atraumatic Eyes:  Sclerae anicteric, conjunctiva pink. Ears: Normal auditory acuity Lungs: Clear throughout to auscultation; no increased WOB. Heart: Regular rate and rhythm; no M/R/G. Abdomen: Soft, non-distended.  BS present.  Non-tender. Rectal:  No external abnormalities noted.  DRE did not reveal any masses.  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   Musculoskeletal: Symmetrical with no gross deformities  Skin: No lesions on visible extremities Extremities: No edema  Neurological: Alert oriented x 4, grossly non-focal Psychological:  Alert and cooperative. Normal mood and affect  ASSESSMENT AND PLAN: *Crohn's colitis with rectal bleeding/flare:  Only on chronic prednisone 10 mg daily.  Increase dose of 20 mg has not helped.  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  Will treat with topical mesalamine, Rowasa enemas at bedtime to see if this helps.  If it is too expensive or does not have any improve after a week or so then may need to increase prednisone to 40 mg daily but would be preferable if we did not have to do that.   CC:  Hoyt Koch, *

## 2017-11-04 NOTE — Patient Instructions (Signed)
We sent a prescription for Rowasa Enema to [facility] [place], [place].  We will prescribe something else if too expensive. Please let us know.  If you are age 76 or older, your body mass index should be between 23-30. Your Body mass index is 28.15 kg/m. If this is out of the aforementioned range listed, please consider follow up with your Primary Care Provider.

## 2017-11-04 NOTE — Telephone Encounter (Signed)
Spoke to patient and asked her to call me tomorrow.  I sent Canasa Suppositories to her pharmacy. I told her to call them before she goes to find out how much they are.  If too expensive, she is to let me know.

## 2017-11-05 NOTE — Telephone Encounter (Signed)
The patient said after insurance was run the Rowasa enemas were still $ 280.00.  I asked Alonza Bogus PA for an alternative and she advised me to send Canasa Suppositories.  I told the patient to call me if she had a problem with cost.

## 2017-11-06 ENCOUNTER — Telehealth: Payer: Self-pay | Admitting: Hematology and Oncology

## 2017-11-06 ENCOUNTER — Encounter: Payer: Self-pay | Admitting: Hematology and Oncology

## 2017-11-06 ENCOUNTER — Telehealth: Payer: Self-pay | Admitting: *Deleted

## 2017-11-06 ENCOUNTER — Other Ambulatory Visit: Payer: Self-pay | Admitting: *Deleted

## 2017-11-06 MED ORDER — PREDNISONE 10 MG PO TABS: ORAL_TABLET | ORAL | 1 refills | Status: DC

## 2017-11-06 NOTE — Telephone Encounter (Signed)
Appt has been scheduled for the pt to see Dr. Lebron Conners on 5/2 at [facility] and directions mailed to the pt.

## 2017-11-06 NOTE — Telephone Encounter (Signed)
Pt said she is returning your call °

## 2017-11-06 NOTE — Telephone Encounter (Signed)
Per Janett Billow, I  advised the patient that we sent a prescription for a Prednisone taper ( 10 mg tablets) to her pharmacy. I explained the taper in detail. The patient verbalized understanding and thanked me for calling this in for her.

## 2017-11-06 NOTE — Telephone Encounter (Signed)
Well then our only other option is to increase her prednisone.  Let's increase to 40 mg daily for one week and taper by 10 mg every week until she is back to her normal 10 mg daily.  Please have her call us back with an update in a couple of weeks.  Send new prescription if needed.  Thank you,  Jess

## 2017-11-06 NOTE — Telephone Encounter (Signed)
The patient called to report the Canasa Suppositories were also over $200.00 after insurance filed.  She wanted me to know she could not pick this prescription up either.

## 2017-11-18 ENCOUNTER — Telehealth: Payer: Self-pay | Admitting: Internal Medicine

## 2017-11-18 NOTE — Telephone Encounter (Signed)
Spoke with [name] [name] regarding AWV. Patient stated that she is not interested in scheduling an appointment at this time. SF

## 2017-11-21 ENCOUNTER — Encounter: Payer: Self-pay | Admitting: Hematology and Oncology

## 2017-11-21 ENCOUNTER — Inpatient Hospital Stay: Payer: Medicare Other

## 2017-11-21 ENCOUNTER — Inpatient Hospital Stay: Payer: Medicare Other | Attending: Hematology and Oncology | Admitting: Hematology and Oncology

## 2017-11-21 ENCOUNTER — Telehealth: Payer: Self-pay | Admitting: Hematology and Oncology

## 2017-11-21 VITALS — BP 155/96 | HR 86 | Temp 99.3°F | Resp 19 | Ht 65.5 in | Wt 175.3 lb

## 2017-11-21 DIAGNOSIS — M129 Arthropathy, unspecified: Secondary | ICD-10-CM | POA: Diagnosis not present

## 2017-11-21 DIAGNOSIS — I10 Essential (primary) hypertension: Secondary | ICD-10-CM

## 2017-11-21 DIAGNOSIS — R7989 Other specified abnormal findings of blood chemistry: Secondary | ICD-10-CM | POA: Insufficient documentation

## 2017-11-21 DIAGNOSIS — Z79899 Other long term (current) drug therapy: Secondary | ICD-10-CM | POA: Diagnosis not present

## 2017-11-21 DIAGNOSIS — K5090 Crohn's disease, unspecified, without complications: Secondary | ICD-10-CM | POA: Diagnosis not present

## 2017-11-21 DIAGNOSIS — Z806 Family history of leukemia: Secondary | ICD-10-CM | POA: Diagnosis not present

## 2017-11-21 DIAGNOSIS — Z808 Family history of malignant neoplasm of other organs or systems: Secondary | ICD-10-CM | POA: Insufficient documentation

## 2017-11-21 DIAGNOSIS — Z86010 Personal history of colonic polyps: Secondary | ICD-10-CM | POA: Insufficient documentation

## 2017-11-21 DIAGNOSIS — Z87891 Personal history of nicotine dependence: Secondary | ICD-10-CM | POA: Diagnosis not present

## 2017-11-21 DIAGNOSIS — D500 Iron deficiency anemia secondary to blood loss (chronic): Secondary | ICD-10-CM | POA: Diagnosis not present

## 2017-11-21 DIAGNOSIS — Z8041 Family history of malignant neoplasm of ovary: Secondary | ICD-10-CM | POA: Insufficient documentation

## 2017-11-21 DIAGNOSIS — K219 Gastro-esophageal reflux disease without esophagitis: Secondary | ICD-10-CM | POA: Diagnosis not present

## 2017-11-21 DIAGNOSIS — Z803 Family history of malignant neoplasm of breast: Secondary | ICD-10-CM | POA: Insufficient documentation

## 2017-11-21 LAB — CMP (CANCER CENTER ONLY)
ALK PHOS: 98 U/L (ref 40–150)
ALT: 21 U/L (ref 0–55)
AST: 23 U/L (ref 5–34)
Albumin: 3.5 g/dL (ref 3.5–5.0)
Anion gap: 10 (ref 3–11)
BUN: 15 mg/dL (ref 7–26)
CALCIUM: 9.4 mg/dL (ref 8.4–10.4)
CO2: 21 mmol/L — AB (ref 22–29)
CREATININE: 0.93 mg/dL (ref 0.60–1.10)
Chloride: 102 mmol/L (ref 98–109)
GFR, Estimated: 58 mL/min — ABNORMAL LOW (ref >60)
GLUCOSE: 124 mg/dL (ref 70–140)
Potassium: 3.5 mmol/L (ref 3.5–5.1)
SODIUM: 133 mmol/L — AB (ref 136–145)
TOTAL PROTEIN: 7.4 g/dL (ref 6.4–8.3)
Total Bilirubin: 0.4 mg/dL (ref 0.2–1.2)

## 2017-11-21 LAB — CBC WITH DIFFERENTIAL (CANCER CENTER ONLY)
BASOS ABS: 0 10*3/uL (ref 0.0–0.1)
BASOS PCT: 0 %
EOS ABS: 0 10*3/uL (ref 0.0–0.5)
Eosinophils Relative: 0 %
HCT: 41.9 % (ref 34.8–46.6)
Hemoglobin: 13.1 g/dL (ref 11.6–15.9)
Lymphocytes Relative: 3 %
Lymphs Abs: 0.6 10*3/uL — ABNORMAL LOW (ref 0.9–3.3)
MCH: 27 pg (ref 25.1–34.0)
MCHC: 31.3 g/dL — AB (ref 31.5–36.0)
MCV: 86.4 fL (ref 79.5–101.0)
MONO ABS: 0.3 10*3/uL (ref 0.1–0.9)
MONOS PCT: 2 %
NEUTROS PCT: 95 %
Neutro Abs: 17.2 10*3/uL — ABNORMAL HIGH (ref 1.5–6.5)
Platelet Count: 421 10*3/uL — ABNORMAL HIGH (ref 145–400)
RBC: 4.85 MIL/uL (ref 3.70–5.45)
RDW: 16.1 % — AB (ref 11.2–14.5)
WBC Count: 18.1 10*3/uL — ABNORMAL HIGH (ref 3.9–10.3)

## 2017-11-21 LAB — IRON AND TIBC
IRON: 26 ug/dL — AB (ref 41–142)
SATURATION RATIOS: 8 % — AB (ref 21–57)
TIBC: 331 ug/dL (ref 236–444)
UIBC: 305 ug/dL

## 2017-11-21 LAB — LACTATE DEHYDROGENASE: LDH: 203 U/L (ref 125–245)

## 2017-11-21 LAB — FERRITIN: Ferritin: 16 ng/mL (ref 9–269)

## 2017-11-21 NOTE — Telephone Encounter (Signed)
Scheduled appt per 5/2 los - Gave patient AVS and calender per los.

## 2017-12-02 ENCOUNTER — Telehealth: Payer: Self-pay | Admitting: Hematology and Oncology

## 2017-12-02 ENCOUNTER — Encounter: Payer: Self-pay | Admitting: Hematology and Oncology

## 2017-12-02 ENCOUNTER — Inpatient Hospital Stay (HOSPITAL_BASED_OUTPATIENT_CLINIC_OR_DEPARTMENT_OTHER): Payer: Medicare Other | Admitting: Hematology and Oncology

## 2017-12-02 VITALS — BP 154/80 | HR 77 | Temp 97.7°F | Resp 18 | Ht 65.5 in | Wt 175.7 lb

## 2017-12-02 DIAGNOSIS — R7989 Other specified abnormal findings of blood chemistry: Secondary | ICD-10-CM | POA: Diagnosis not present

## 2017-12-02 DIAGNOSIS — Z803 Family history of malignant neoplasm of breast: Secondary | ICD-10-CM

## 2017-12-02 DIAGNOSIS — K5090 Crohn's disease, unspecified, without complications: Secondary | ICD-10-CM | POA: Diagnosis not present

## 2017-12-02 DIAGNOSIS — Z86010 Personal history of colonic polyps: Secondary | ICD-10-CM

## 2017-12-02 DIAGNOSIS — D500 Iron deficiency anemia secondary to blood loss (chronic): Secondary | ICD-10-CM | POA: Diagnosis not present

## 2017-12-02 DIAGNOSIS — Z79899 Other long term (current) drug therapy: Secondary | ICD-10-CM

## 2017-12-02 DIAGNOSIS — M129 Arthropathy, unspecified: Secondary | ICD-10-CM | POA: Diagnosis not present

## 2017-12-02 DIAGNOSIS — K219 Gastro-esophageal reflux disease without esophagitis: Secondary | ICD-10-CM | POA: Diagnosis not present

## 2017-12-02 DIAGNOSIS — Z806 Family history of leukemia: Secondary | ICD-10-CM

## 2017-12-02 DIAGNOSIS — Z808 Family history of malignant neoplasm of other organs or systems: Secondary | ICD-10-CM

## 2017-12-02 DIAGNOSIS — I10 Essential (primary) hypertension: Secondary | ICD-10-CM | POA: Diagnosis not present

## 2017-12-02 DIAGNOSIS — Z8041 Family history of malignant neoplasm of ovary: Secondary | ICD-10-CM | POA: Diagnosis not present

## 2017-12-02 DIAGNOSIS — Z87891 Personal history of nicotine dependence: Secondary | ICD-10-CM | POA: Diagnosis not present

## 2017-12-02 MED ORDER — FERROUS SULFATE 325 (65 FE) MG PO TBEC: 325.0000 mg | DELAYED_RELEASE_TABLET | Freq: Three times a day (TID) | ORAL | 3 refills | Status: DC

## 2017-12-02 NOTE — Telephone Encounter (Signed)
Scheduled appt per 5/13 los - Gave patient AVS and calender per los.

## 2017-12-11 DIAGNOSIS — R7989 Other specified abnormal findings of blood chemistry: Secondary | ICD-10-CM | POA: Insufficient documentation

## 2017-12-11 DIAGNOSIS — D75839 Thrombocytosis, unspecified: Secondary | ICD-10-CM | POA: Insufficient documentation

## 2017-12-11 NOTE — Progress Notes (Signed)
[name] New Visit:  Assessment: Elevated platelet count 77 y.o. female with active Crohn's colitis referred to the clinic for evaluation of elevated platelet count.  On review of historical hematological profile, patient has chronic leukocytosis and chronic elevated platelet count with the most recent value in April 2019 being the highest value recorded.  Concurrently, patient has developed progressive drop in hemoglobin down from 15.3 in 2016 to 13.0 in April 2019.  Differential diagnosis includes reactive thrombocythemia and leukocytosis due to recurrent GI bleeding which is supported by progressive decline in the hemoglobin.  Thrombocytosis as well as leukocytosis could be also reactive change due to persistent inflammatory state of Crohn's colitis.  Finally, primary hematological abnormalities such as essential thrombocythemia or polycythemia vera could be the underlying drivers.  Plan: - Labs today as outlined below. - Return to clinic in 1 week to review the findings.   Voice recognition software was used and creation of this note. Despite my best effort at editing the text, some misspelling/errors may have occurred. Orders Placed This Encounter  Procedures  . CBC with Differential (Cancer Center Only)    Standing Status:   Future    Number of Occurrences:   1    Standing Expiration Date:   11/22/2018  . CMP ([place] only)    Standing Status:   Future    Number of Occurrences:   1    Standing Expiration Date:   11/22/2018  . Lactate dehydrogenase (LDH)    Standing Status:   Future    Number of Occurrences:   1    Standing Expiration Date:   11/21/2018  . Iron and TIBC    Standing Status:   Future    Number of Occurrences:   1    Standing Expiration Date:   11/22/2018  . Ferritin    Standing Status:   Future    Number of Occurrences:   1    Standing Expiration Date:   11/22/2018    All questions were answered.  . The patient knows to call the clinic  with any problems, questions or concerns.  This note was electronically signed.    History of Presenting Illness [name] [name] 77 y.o. presenting to the [facility] for elevation of platelet count, referred by Dr Pricilla Holm.  Patient's past medical history is significant for Bell's palsy, history of cataracts, GERD, hypertension, and Crohn's colitis for which she is presently receiving prednisone.  Patient's family history significant for paternal aunt with breast cancer, maternal grandfather and mother with history of leukemia, paternal aunt with ovarian cancer, and paternal aunt with head and neck squamous cell carcinoma associated with tobacco abuse.  Patient reports recurrent rectal bleeding episode on 11/04/2017 she also complains of achy joints despite prednisone being increased for 2 weeks up to 20 mg daily and additionally increased up to 40 mg on 11/06/2017.  Patient denies fevers, chills, night sweats.  No headaches, vision changes, numbness, tingling, or burning in the extremities.  Denies nausea, vomiting, abdominal pain, diarrhea.  No dysuria or hematuria.  Oncological/hematological History: --Labs, 04/01/15: WBC 23.6, Hgb 15.3, Plt 503; --Labs, 03/19/16: WBC 16.9, Hgb 14.5, Plt 708 --Labs, 12/31/16: WBC 15.8, Hgb 14.4, Plt 478 --Labs, 10/31/17: WBC 14.3, Hgb 13.0, Plt 724;   Medical History: Past Medical History:  Diagnosis Date  . Allergy    year around allergies   . Arthritis    shoulder  . Bell's palsy   . Blood transfusion  1966  . Blood transfusion without reported diagnosis   . Cataract    bilaterally removed  . Colon polyps    adenomatous polyp June 2012  . Complication of anesthesia    slow to wake  . Crohn's colitis ([place])   . Dry eyes    left eye worst one  . GERD (gastroesophageal reflux disease)   . Headache(784.0)    hx migraines  . Hypertension   . Shortness of breath     Surgical History: Past Surgical History:  Procedure  Laterality Date  . ABDOMINAL HYSTERECTOMY    . APPENDECTOMY    . CATARACT EXTRACTION Bilateral    Separate dates  . CHOLECYSTECTOMY    . COLONOSCOPY  multiple  . flex sigmoidoscopy with biospy  01/17/12  . FLEXIBLE SIGMOIDOSCOPY  06/08/2011   severe Crohn's colitis to descending colon at least  . FOOT GANGLION EXCISION     left  . SHOULDER SURGERY     right  . TONSILLECTOMY      Family History: Family History  Problem Relation Age of Onset  . Leukemia Mother   . Heart disease Mother   . Heart disease Father   . Heart attack Father   . Ovarian cancer Paternal Aunt        1/2 aunt  . Breast cancer Paternal Aunt        1/2 aunt  . Throat cancer Paternal Aunt        1/2 aunt  . Parkinsonism Brother   . Heart disease Brother   . Leukemia Maternal Grandfather   . Anesthesia problems Neg Hx   . Hypotension Neg Hx   . Malignant hyperthermia Neg Hx   . Pseudochol deficiency Neg Hx   . Colon cancer Neg Hx   . Diabetes Neg Hx   . Esophageal cancer Neg Hx   . Rectal cancer Neg Hx   . Stomach cancer Neg Hx     Social History: Social History   Socioeconomic History  . Marital status: Widowed    Spouse name: Not on file  . Number of children: 3  . Years of education: Not on file  . Highest education level: Not on file  Occupational History  . Occupation: retired     Fish farm manager: RETIRED  Social Needs  . Financial resource strain: Not on file  . Food insecurity:    Worry: Not on file    Inability: Not on file  . Transportation needs:    Medical: Not on file    Non-medical: Not on file  Tobacco Use  . Smoking status: Former Smoker    Packs/day: 0.25    Years: 10.00    Pack years: 2.50    Types: Cigarettes    Last attempt to quit: 09/26/1968    Years since quitting: 49.2  . Smokeless tobacco: Never Used  Substance and Sexual Activity  . Alcohol use: No    Alcohol/week: 0.0 oz    Comment: rare wine  . Drug use: No  . Sexual activity: Not Currently  Lifestyle  .  Physical activity:    Days per week: Not on file    Minutes per session: Not on file  . Stress: Not on file  Relationships  . Social connections:    Talks on phone: Not on file    Gets together: Not on file    Attends religious service: Not on file    Active member of club or organization: Not on file  Attends meetings of clubs or organizations: Not on file    Relationship status: Not on file  . Intimate partner violence:    Fear of current or ex partner: Not on file    Emotionally abused: Not on file    Physically abused: Not on file    Forced sexual activity: Not on file  Other Topics Concern  . Not on file  Social History Narrative  . Not on file    Allergies: Allergies  Allergen Reactions  . Azathioprine Nausea Only    Elevated lipase also - ? Pancreatitis   . Entyvio [Vedolizumab] Shortness Of Breath  . Adhesive [Tape] Rash    Taking skin off  . Sulfa Antibiotics Itching and Rash    Medications:  Current Outpatient Medications  Medication Sig Dispense Refill  . b complex vitamins capsule Take 1 capsule by mouth daily.      Marland Kitchen BYSTOLIC 10 MG tablet Take 1 tablet (10 mg total) by mouth daily. Annual appt is due must see provider for future refills 90 tablet 0  . cetirizine (ZYRTEC) 10 MG tablet Take 10 mg by mouth daily as needed for allergies.     Marland Kitchen dicyclomine (BENTYL) 20 MG tablet TAKE 1 TABLET (20 MG TOTAL) BY MOUTH 3 (THREE) TIMES DAILY. FOR STOMACH PAIN 90 tablet 0  . diphenoxylate-atropine (LOMOTIL) 2.5-0.025 MG tablet TAKE 1 TO 2 TABLETS BY MOUTH EVERY 4 HOURS AS NEEDED FOR DIARRHEA 90 tablet 0  . famotidine (PEPCID) 20 MG tablet Take 20 mg by mouth 2 (two) times daily.    . ferrous sulfate 325 (65 FE) MG EC tablet Take 1 tablet (325 mg total) by mouth 3 (three) times daily with meals. 90 tablet 3  . gabapentin (NEURONTIN) 300 MG capsule Take 300 mg by mouth 3 (three) times daily.    . mesalamine (CANASA) 1000 MG suppository Place 1 suppository (1,000 mg  total) rectally at bedtime. 30 suppository 1  . mesalamine (ROWASA) 4 g enema Use 1 enema at bedtime. 30 Bottle 1  . Multiple Vitamins-Minerals (MULTIVITAMINS THER. W/MINERALS) TABS Take 1 tablet by mouth daily as needed.     . ondansetron (ZOFRAN) 4 MG tablet Take 1 tablet (4 mg total) by mouth every 8 (eight) hours as needed for nausea or vomiting. 30 tablet 1  . pantoprazole (PROTONIX) 40 MG tablet Take 1 tablet (40 mg total) by mouth daily. 90 tablet 3  . predniSONE (DELTASONE) 10 MG tablet Take 1 tablet (10 mg total) by mouth daily. 100 tablet 1  . predniSONE (DELTASONE) 10 MG tablet Take 4 tabs x 7 days, take 3 tabs x 7 days, take 2 tabs x 7, then 10 mg tablets daily. 100 tablet 1  . traMADol (ULTRAM) 50 MG tablet TAKE 1 TABLET BY MOUTH EVERY 8 HOURS AS NEEDED 60 tablet 1   No current facility-administered medications for this visit.     Review of Systems: Review of Systems  Gastrointestinal: Positive for blood in stool.  Musculoskeletal: Positive for arthralgias.  All other systems reviewed and are negative.    PHYSICAL EXAMINATION Blood pressure (!) 155/96, pulse 86, temperature 99.3 F (37.4 C), temperature source Oral, resp. rate 19, height 5' 5.5" (1.664 m), weight 175 lb 4.8 oz (79.5 kg), SpO2 93 %.  ECOG PERFORMANCE STATUS: 2 - Symptomatic, <50% confined to bed  Physical Exam  Constitutional: She is oriented to person, place, and time. She appears well-developed and well-nourished. No distress.  HENT:  Head: Normocephalic and atraumatic.  Mouth/Throat: Oropharynx is clear and moist. No oropharyngeal exudate.  Eyes: Pupils are equal, round, and reactive to light. Conjunctivae and EOM are normal. No scleral icterus.  Neck: No thyromegaly present.  Cardiovascular: Normal rate, regular rhythm, normal heart sounds and intact distal pulses. Exam reveals no gallop and no friction rub.  No murmur heard. Pulmonary/Chest: Effort normal and breath sounds normal. No stridor. No  respiratory distress. She has no wheezes. She has no rales.  Abdominal: Soft. Bowel sounds are normal. She exhibits no distension and no mass. There is no tenderness. There is no guarding.  Musculoskeletal: She exhibits no edema.  Lymphadenopathy:    She has no cervical adenopathy.  Neurological: She is alert and oriented to person, place, and time. She displays normal reflexes. No cranial nerve deficit or sensory deficit.  Skin: Skin is warm and dry. No rash noted. She is not diaphoretic. No erythema. No pallor.     LABORATORY DATA: I have personally reviewed the data as listed: Appointment on 11/21/2017  Component Date Value Ref Range Status  . Ferritin 11/21/2017 16  9 - 269 ng/mL Final   Performed at [facility], [place] [address], [place], [place] [address]  . Iron 11/21/2017 26* 41 - 142 ug/dL Final  . TIBC 11/21/2017 331  236 - 444 ug/dL Final  . Saturation Ratios 11/21/2017 8* 21 - 57 % Final  . UIBC 11/21/2017 305  ug/dL Final   Performed at [facility], [place] [address], [place], [place] [address]  . LDH 11/21/2017 203  125 - 245 U/L Final   Performed at [facility], [place] [address] [place], [place] [address]  . Sodium 11/21/2017 133* 136 - 145 mmol/L Final  . Potassium 11/21/2017 3.5  3.5 - 5.1 mmol/L Final  . Chloride 11/21/2017 102  98 - 109 mmol/L Final  . CO2 11/21/2017 21* 22 - 29 mmol/L Final  . Glucose, Bld 11/21/2017 124  70 - 140 mg/dL Final  . BUN 11/21/2017 15  7 - 26 mg/dL Final  . Creatinine 11/21/2017 0.93  0.60 - 1.10 mg/dL Final  . Calcium 11/21/2017 9.4  8.4 - 10.4 mg/dL Final  . Total Protein 11/21/2017 7.4  6.4 - 8.3 g/dL Final  . Albumin 11/21/2017 3.5  3.5 - 5.0 g/dL Final  . AST 11/21/2017 23  5 - 34 U/L Final  . ALT 11/21/2017 21  0 - 55 U/L Final  . Alkaline Phosphatase 11/21/2017 98  40 - 150 U/L Final  . Total Bilirubin 11/21/2017 0.4  0.2 - 1.2 mg/dL Final  . GFR, Est Non  Af Am 11/21/2017 58* >60 mL/min Final  . GFR, Est AFR Am 11/21/2017 >60  >60 mL/min Final   Comment: (NOTE) The eGFR has been calculated using the CKD EPI equation. This calculation has not been validated in all clinical situations. eGFR's persistently <60 mL/min signify possible Chronic Kidney Disease.   Georgiann Hahn gap 11/21/2017 10  3 - 11 Final   Performed at [facility], [place] [address] [place], [place] [address]  . WBC Count 11/21/2017 18.1* 3.9 - 10.3 K/uL Final  . RBC 11/21/2017 4.85  3.70 - 5.45 MIL/uL Final  . Hemoglobin 11/21/2017 13.1  11.6 - 15.9 g/dL Final  . HCT 11/21/2017 41.9  34.8 - 46.6 % Final  . MCV 11/21/2017 86.4  79.5 - 101.0 fL Final  . MCH 11/21/2017 27.0  25.1 - 34.0 pg Final  .  MCHC 11/21/2017 31.3* 31.5 - 36.0 g/dL Final  . RDW 11/21/2017 16.1* 11.2 - 14.5 % Final  . Platelet Count 11/21/2017 421* 145 - 400 K/uL Final  . Neutrophils Relative % 11/21/2017 95  % Final  . Neutro Abs 11/21/2017 17.2* 1.5 - 6.5 K/uL Final  . Lymphocytes Relative 11/21/2017 3  % Final  . Lymphs Abs 11/21/2017 0.6* 0.9 - 3.3 K/uL Final  . Monocytes Relative 11/21/2017 2  % Final  . Monocytes Absolute 11/21/2017 0.3  0.1 - 0.9 K/uL Final  . Eosinophils Relative 11/21/2017 0  % Final  . Eosinophils Absolute 11/21/2017 0.0  0.0 - 0.5 K/uL Final  . Basophils Relative 11/21/2017 0  % Final  . Basophils Absolute 11/21/2017 0.0  0.0 - 0.1 K/uL Final   Performed at [facility], [place] [address] [place], [place] [address]         Ardath Sax, MD

## 2017-12-11 NOTE — Assessment & Plan Note (Signed)
77 y.o. female with active Crohn's colitis referred to the clinic for evaluation of elevated platelet count.  On review of historical hematological profile, patient has chronic leukocytosis and chronic elevated platelet count with the most recent value in April 2019 being the highest value recorded.  Concurrently, patient has developed progressive drop in hemoglobin down from 15.3 in 2016 to 13.0 in April 2019.  Differential diagnosis includes reactive thrombocythemia and leukocytosis due to recurrent GI bleeding which is supported by progressive decline in the hemoglobin.  Thrombocytosis as well as leukocytosis could be also reactive change due to persistent inflammatory state of Crohn's colitis.  Finally, primary hematological abnormalities such as essential thrombocythemia or polycythemia vera could be the underlying drivers.  Plan: - Labs today as outlined below. - Return to clinic in 1 week to review the findings.

## 2017-12-18 DIAGNOSIS — D500 Iron deficiency anemia secondary to blood loss (chronic): Secondary | ICD-10-CM | POA: Insufficient documentation

## 2017-12-18 NOTE — Progress Notes (Signed)
[name] Follow-up Visit:  Assessment: Iron deficiency anemia due to chronic blood loss 77 y.o. female with active Crohn's colitis referred to the clinic for evaluation of elevated platelet count.  On review of historical hematological profile, patient has chronic leukocytosis and chronic elevated platelet count with the most recent value in Apr 2019 being the highest value recorded.  Concurrently, patient has developed progressive drop in hemoglobin down from 15.3 in 2016 to 13.0 in Apr 2019.  Our additional evaluation confirmed suspicion that elevated platelet count is likely related to iron deficiency based on the values of both iron, iron saturation, and ferritin.  In the presence of Crohn's colitis with significant inflammatory reaction, ferritin is expected to be significantly elevated, nevertheless in this patient it is falsely normal.  Further elevation of white blood cell count is consistent with reaction to prednisone.  Concurrently, platelet count has dropped significantly suggesting that the previous elevation of platelets was in part due to inflammatory reaction.  Plan: - Recommend patient to follow-up with Dr. Carlean Purl who is her gastroenterologist.  At his discretion, suggest patient undergo EGD/colonoscopy as well as possible CT of the abdomen/pelvis. - Starting patient on ferrous sulfate 325 mg daily and increase up to 3 times per day as tolerated - Return to clinic in 1 month: Labs will be obtained 2 to 3 days prior to the visit, possible parenteral iron supplementation if patient does not tolerate oral supplementation.   Voice recognition software was used and creation of this note. Despite my best effort at editing the text, some misspelling/errors may have occurred.  Orders Placed This Encounter  Procedures  . CBC with Differential ([name] Only)    Standing Status:   Future    Standing Expiration Date:   12/03/2018  . CMP ([place] only)   Standing Status:   Future    Standing Expiration Date:   12/03/2018  . Iron and TIBC    Standing Status:   Future    Standing Expiration Date:   12/03/2018  . Ferritin    Standing Status:   Future    Standing Expiration Date:   12/03/2018    [name] Staging No matching staging information was found for the patient.  All questions were answered.  . The patient knows to call the clinic with any problems, questions or concerns.  This note was electronically signed.    History of Presenting Illness [name] [name] is a 77 y.o. female followed in the [place] for elevation of platelet count, referred by Dr Pricilla Holm.  Patient's past medical history is significant for Bell's palsy, history of cataracts, GERD, hypertension, and Crohn's colitis for which she is presently receiving prednisone.  Patient's family history significant for paternal aunt with breast [name], maternal grandfather and mother with history of leukemia, paternal aunt with ovarian [name], and paternal aunt with head and neck squamous cell carcinoma associated with tobacco abuse.  Patient reports recurrent rectal bleeding episode on 11/04/2017 she also complains of achy joints despite prednisone being increased for 2 weeks up to 20 mg daily and additionally increased up to 40 mg on 11/06/2017.  Patient denies fevers, chills, night sweats.  No headaches, vision changes, numbness, tingling, or burning in the extremities.  Denies nausea, vomiting, abdominal pain, diarrhea.  No dysuria or hematuria.  Patient returns to the clinic to review the findings of labs obtained at the last visit.  Her symptoms since last clinic visit.  Oncological/hematological History: --Labs, 04/01/15: WBC  23.6, Hgb 15.3,        Plt 503; --Labs, 03/19/16: WBC 16.9, Hgb 14.5,        Plt 708 --Labs, 12/31/16: WBC 15.8, Hgb 14.4,        Plt 478 --Labs, 10/31/17: WBC 14.3, Hgb 13.0,        Plt 724; --Labs, 11/21/17: WBC 18.1, Hgb 13.1, MCV 86.4,  MCH 27.0, MCHC 31.3, RDW 16.1,  Plt 421; Fe 26, FeSat 8%, TIBC 331, Ferritin 16   No history exists.    Medical History: Past Medical History:  Diagnosis Date  . Allergy    year around allergies   . Arthritis    shoulder  . Bell's palsy   . Blood transfusion    1966  . Blood transfusion without reported diagnosis   . Cataract    bilaterally removed  . Colon polyps    adenomatous polyp June 2012  . Complication of anesthesia    slow to wake  . Crohn's colitis ([place])   . Dry eyes    left eye worst one  . GERD (gastroesophageal reflux disease)   . Headache(784.0)    hx migraines  . Hypertension   . Shortness of breath     Surgical History: Past Surgical History:  Procedure Laterality Date  . ABDOMINAL HYSTERECTOMY    . APPENDECTOMY    . CATARACT EXTRACTION Bilateral    Separate dates  . CHOLECYSTECTOMY    . COLONOSCOPY  multiple  . flex sigmoidoscopy with biospy  01/17/12  . FLEXIBLE SIGMOIDOSCOPY  06/08/2011   severe Crohn's colitis to descending colon at least  . FOOT GANGLION EXCISION     left  . SHOULDER SURGERY     right  . TONSILLECTOMY      Family History: Family History  Problem Relation Age of Onset  . Leukemia Mother   . Heart disease Mother   . Heart disease Father   . Heart attack Father   . Ovarian [name] Paternal Aunt        1/2 aunt  . Breast [name] Paternal Aunt        1/2 aunt  . Throat [name] Paternal Aunt        1/2 aunt  . Parkinsonism Brother   . Heart disease Brother   . Leukemia Maternal Grandfather   . Anesthesia problems Neg Hx   . Hypotension Neg Hx   . Malignant hyperthermia Neg Hx   . Pseudochol deficiency Neg Hx   . Colon [name] Neg Hx   . Diabetes Neg Hx   . Esophageal [name] Neg Hx   . Rectal [name] Neg Hx   . Stomach [name] Neg Hx     Social History: Social History   Socioeconomic History  . Marital status: Widowed    Spouse name: Not on file  . Number of children: 3  . Years of education: Not on file  .  Highest education level: Not on file  Occupational History  . Occupation: retired     Fish farm manager: RETIRED  Social Needs  . Financial resource strain: Not on file  . Food insecurity:    Worry: Not on file    Inability: Not on file  . Transportation needs:    Medical: Not on file    Non-medical: Not on file  Tobacco Use  . Smoking status: Former Smoker    Packs/day: 0.25    Years: 10.00    Pack years: 2.50    Types: Cigarettes    Last  attempt to quit: 09/26/1968    Years since quitting: 49.2  . Smokeless tobacco: Never Used  Substance and Sexual Activity  . Alcohol use: No    Alcohol/week: 0.0 oz    Comment: rare wine  . Drug use: No  . Sexual activity: Not Currently  Lifestyle  . Physical activity:    Days per week: Not on file    Minutes per session: Not on file  . Stress: Not on file  Relationships  . Social connections:    Talks on phone: Not on file    Gets together: Not on file    Attends religious service: Not on file    Active member of club or organization: Not on file    Attends meetings of clubs or organizations: Not on file    Relationship status: Not on file  . Intimate partner violence:    Fear of current or ex partner: Not on file    Emotionally abused: Not on file    Physically abused: Not on file    Forced sexual activity: Not on file  Other Topics Concern  . Not on file  Social History Narrative  . Not on file    Allergies: Allergies  Allergen Reactions  . Azathioprine Nausea Only    Elevated lipase also - ? Pancreatitis   . Entyvio [Vedolizumab] Shortness Of Breath  . Adhesive [Tape] Rash    Taking skin off  . Sulfa Antibiotics Itching and Rash    Medications:  Current Outpatient Medications  Medication Sig Dispense Refill  . b complex vitamins capsule Take 1 capsule by mouth daily.      Marland Kitchen BYSTOLIC 10 MG tablet Take 1 tablet (10 mg total) by mouth daily. Annual appt is due must see provider for future refills 90 tablet 0  . cetirizine  (ZYRTEC) 10 MG tablet Take 10 mg by mouth daily as needed for allergies.     Marland Kitchen dicyclomine (BENTYL) 20 MG tablet TAKE 1 TABLET (20 MG TOTAL) BY MOUTH 3 (THREE) TIMES DAILY. FOR STOMACH PAIN 90 tablet 0  . diphenoxylate-atropine (LOMOTIL) 2.5-0.025 MG tablet TAKE 1 TO 2 TABLETS BY MOUTH EVERY 4 HOURS AS NEEDED FOR DIARRHEA 90 tablet 0  . famotidine (PEPCID) 20 MG tablet Take 20 mg by mouth 2 (two) times daily.    Marland Kitchen gabapentin (NEURONTIN) 300 MG capsule Take 300 mg by mouth 3 (three) times daily.    . mesalamine (CANASA) 1000 MG suppository Place 1 suppository (1,000 mg total) rectally at bedtime. 30 suppository 1  . mesalamine (ROWASA) 4 g enema Use 1 enema at bedtime. 30 Bottle 1  . Multiple Vitamins-Minerals (MULTIVITAMINS THER. W/MINERALS) TABS Take 1 tablet by mouth daily as needed.     . ondansetron (ZOFRAN) 4 MG tablet Take 1 tablet (4 mg total) by mouth every 8 (eight) hours as needed for nausea or vomiting. 30 tablet 1  . pantoprazole (PROTONIX) 40 MG tablet Take 1 tablet (40 mg total) by mouth daily. 90 tablet 3  . predniSONE (DELTASONE) 10 MG tablet Take 1 tablet (10 mg total) by mouth daily. 100 tablet 1  . predniSONE (DELTASONE) 10 MG tablet Take 4 tabs x 7 days, take 3 tabs x 7 days, take 2 tabs x 7, then 10 mg tablets daily. 100 tablet 1  . traMADol (ULTRAM) 50 MG tablet TAKE 1 TABLET BY MOUTH EVERY 8 HOURS AS NEEDED 60 tablet 1  . ferrous sulfate 325 (65 FE) MG EC tablet Take 1 tablet (325  mg total) by mouth 3 (three) times daily with meals. 90 tablet 3   No current facility-administered medications for this visit.     Review of Systems: Review of Systems  Gastrointestinal: Positive for blood in stool.  Musculoskeletal: Positive for arthralgias.  All other systems reviewed and are negative.    PHYSICAL EXAMINATION Blood pressure (!) 154/80, pulse 77, temperature 97.7 F (36.5 C), temperature source Oral, resp. rate 18, height 5' 5.5" (1.664 m), weight 175 lb 11.2 oz (79.7  kg), SpO2 99 %.  ECOG PERFORMANCE STATUS: 2 - Symptomatic, <50% confined to bed  Physical Exam  Constitutional: She is oriented to person, place, and time. She appears well-developed and well-nourished. No distress.  HENT:  Head: Normocephalic and atraumatic.  Mouth/Throat: Oropharynx is clear and moist. No oropharyngeal exudate.  Eyes: Pupils are equal, round, and reactive to light. Conjunctivae and EOM are normal. No scleral icterus.  Neck: No thyromegaly present.  Cardiovascular: Normal rate, regular rhythm, normal heart sounds and intact distal pulses. Exam reveals no gallop and no friction rub.  No murmur heard. Pulmonary/Chest: Effort normal and breath sounds normal. No stridor. No respiratory distress. She has no wheezes. She has no rales.  Abdominal: Soft. Bowel sounds are normal. She exhibits no distension and no mass. There is no tenderness. There is no guarding.  Musculoskeletal: She exhibits no edema.  Lymphadenopathy:    She has no cervical adenopathy.  Neurological: She is alert and oriented to person, place, and time. She displays normal reflexes. No cranial nerve deficit or sensory deficit.  Skin: Skin is warm and dry. No rash noted. She is not diaphoretic. No erythema. No pallor.     LABORATORY DATA: I have personally reviewed the data as listed: No visits with results within 1 Week(s) from this visit.  Latest known visit with results is:  Appointment on 11/21/2017  Component Date Value Ref Range Status  . Ferritin 11/21/2017 16  9 - 269 ng/mL Final   Performed at [facility], [place] [address] [place], [place] [address]  . Iron 11/21/2017 26* 41 - 142 ug/dL Final  . TIBC 11/21/2017 331  236 - 444 ug/dL Final  . Saturation Ratios 11/21/2017 8* 21 - 57 % Final  . UIBC 11/21/2017 305  ug/dL Final   Performed at [facility], [place] [address], [place], [place] [address]  . LDH 11/21/2017 203  125 - 245 U/L Final    Performed at [facility], [place] [address] [place], [place] [address]  . Sodium 11/21/2017 133* 136 - 145 mmol/L Final  . Potassium 11/21/2017 3.5  3.5 - 5.1 mmol/L Final  . Chloride 11/21/2017 102  98 - 109 mmol/L Final  . CO2 11/21/2017 21* 22 - 29 mmol/L Final  . Glucose, Bld 11/21/2017 124  70 - 140 mg/dL Final  . BUN 11/21/2017 15  7 - 26 mg/dL Final  . Creatinine 11/21/2017 0.93  0.60 - 1.10 mg/dL Final  . Calcium 11/21/2017 9.4  8.4 - 10.4 mg/dL Final  . Total Protein 11/21/2017 7.4  6.4 - 8.3 g/dL Final  . Albumin 11/21/2017 3.5  3.5 - 5.0 g/dL Final  . AST 11/21/2017 23  5 - 34 U/L Final  . ALT 11/21/2017 21  0 - 55 U/L Final  . Alkaline Phosphatase 11/21/2017 98  40 - 150 U/L Final  . Total Bilirubin 11/21/2017 0.4  0.2 - 1.2 mg/dL Final  . GFR, Est Non Af  Am 11/21/2017 58* >60 mL/min Final  . GFR, Est AFR Am 11/21/2017 >60  >60 mL/min Final   Comment: (NOTE) The eGFR has been calculated using the CKD EPI equation. This calculation has not been validated in all clinical situations. eGFR's persistently <60 mL/min signify possible Chronic Kidney Disease.   Georgiann Hahn gap 11/21/2017 10  3 - 11 Final   Performed at [facility], [place] [address] [place], [place] [address]  . WBC Count 11/21/2017 18.1* 3.9 - 10.3 K/uL Final  . RBC 11/21/2017 4.85  3.70 - 5.45 MIL/uL Final  . Hemoglobin 11/21/2017 13.1  11.6 - 15.9 g/dL Final  . HCT 11/21/2017 41.9  34.8 - 46.6 % Final  . MCV 11/21/2017 86.4  79.5 - 101.0 fL Final  . MCH 11/21/2017 27.0  25.1 - 34.0 pg Final  . MCHC 11/21/2017 31.3* 31.5 - 36.0 g/dL Final  . RDW 11/21/2017 16.1* 11.2 - 14.5 % Final  . Platelet Count 11/21/2017 421* 145 - 400 K/uL Final  . Neutrophils Relative % 11/21/2017 95  % Final  . Neutro Abs 11/21/2017 17.2* 1.5 - 6.5 K/uL Final  . Lymphocytes Relative 11/21/2017 3  % Final  . Lymphs Abs 11/21/2017 0.6* 0.9 - 3.3 K/uL Final  . Monocytes Relative 11/21/2017  2  % Final  . Monocytes Absolute 11/21/2017 0.3  0.1 - 0.9 K/uL Final  . Eosinophils Relative 11/21/2017 0  % Final  . Eosinophils Absolute 11/21/2017 0.0  0.0 - 0.5 K/uL Final  . Basophils Relative 11/21/2017 0  % Final  . Basophils Absolute 11/21/2017 0.0  0.0 - 0.1 K/uL Final   Performed at [facility], [place] [address], [place], [place] [address]       Ardath Sax, MD

## 2017-12-18 NOTE — Assessment & Plan Note (Signed)
77 y.o. female with active Crohn's colitis referred to the clinic for evaluation of elevated platelet count.  On review of historical hematological profile, patient has chronic leukocytosis and chronic elevated platelet count with the most recent value in Apr 2019 being the highest value recorded.  Concurrently, patient has developed progressive drop in hemoglobin down from 15.3 in 2016 to 13.0 in Apr 2019.  Our additional evaluation confirmed suspicion that elevated platelet count is likely related to iron deficiency based on the values of both iron, iron saturation, and ferritin.  In the presence of Crohn's colitis with significant inflammatory reaction, ferritin is expected to be significantly elevated, nevertheless in this patient it is falsely normal.  Further elevation of white blood cell count is consistent with reaction to prednisone.  Concurrently, platelet count has dropped significantly suggesting that the previous elevation of platelets was in part due to inflammatory reaction.  Plan: - Recommend patient to follow-up with Dr. Carlean Purl who is her gastroenterologist.  At his discretion, suggest patient undergo EGD/colonoscopy as well as possible CT of the abdomen/pelvis. - Starting patient on ferrous sulfate 325 mg daily and increase up to 3 times per day as tolerated - Return to clinic in 1 month: Labs will be obtained 2 to 3 days prior to the visit, possible parenteral iron supplementation if patient does not tolerate oral supplementation.

## 2017-12-19 ENCOUNTER — Other Ambulatory Visit: Payer: Self-pay | Admitting: Internal Medicine

## 2017-12-30 ENCOUNTER — Other Ambulatory Visit: Payer: Self-pay | Admitting: Hematology

## 2017-12-31 ENCOUNTER — Inpatient Hospital Stay: Payer: Medicare Other | Attending: Hematology and Oncology

## 2017-12-31 DIAGNOSIS — D509 Iron deficiency anemia, unspecified: Secondary | ICD-10-CM | POA: Diagnosis not present

## 2017-12-31 DIAGNOSIS — I10 Essential (primary) hypertension: Secondary | ICD-10-CM | POA: Insufficient documentation

## 2017-12-31 DIAGNOSIS — D696 Thrombocytopenia, unspecified: Secondary | ICD-10-CM | POA: Insufficient documentation

## 2017-12-31 DIAGNOSIS — D72829 Elevated white blood cell count, unspecified: Secondary | ICD-10-CM | POA: Insufficient documentation

## 2017-12-31 DIAGNOSIS — M549 Dorsalgia, unspecified: Secondary | ICD-10-CM | POA: Insufficient documentation

## 2017-12-31 DIAGNOSIS — Z79899 Other long term (current) drug therapy: Secondary | ICD-10-CM | POA: Insufficient documentation

## 2017-12-31 DIAGNOSIS — R5383 Other fatigue: Secondary | ICD-10-CM | POA: Diagnosis not present

## 2017-12-31 DIAGNOSIS — K219 Gastro-esophageal reflux disease without esophagitis: Secondary | ICD-10-CM | POA: Insufficient documentation

## 2017-12-31 DIAGNOSIS — Z86010 Personal history of colonic polyps: Secondary | ICD-10-CM | POA: Diagnosis not present

## 2017-12-31 DIAGNOSIS — Z806 Family history of leukemia: Secondary | ICD-10-CM | POA: Diagnosis not present

## 2017-12-31 DIAGNOSIS — D500 Iron deficiency anemia secondary to blood loss (chronic): Secondary | ICD-10-CM

## 2017-12-31 DIAGNOSIS — K5090 Crohn's disease, unspecified, without complications: Secondary | ICD-10-CM | POA: Insufficient documentation

## 2017-12-31 DIAGNOSIS — M129 Arthropathy, unspecified: Secondary | ICD-10-CM | POA: Diagnosis not present

## 2017-12-31 DIAGNOSIS — Z8041 Family history of malignant neoplasm of ovary: Secondary | ICD-10-CM | POA: Insufficient documentation

## 2017-12-31 DIAGNOSIS — M8580 Other specified disorders of bone density and structure, unspecified site: Secondary | ICD-10-CM | POA: Insufficient documentation

## 2017-12-31 LAB — CMP (CANCER CENTER ONLY)
ALK PHOS: 113 U/L (ref 40–150)
ALT: 33 U/L (ref 0–55)
ANION GAP: 7 (ref 3–11)
AST: 36 U/L — ABNORMAL HIGH (ref 5–34)
Albumin: 3.7 g/dL (ref 3.5–5.0)
BUN: 12 mg/dL (ref 7–26)
CALCIUM: 9.8 mg/dL (ref 8.4–10.4)
CO2: 26 mmol/L (ref 22–29)
Chloride: 104 mmol/L (ref 98–109)
Creatinine: 0.98 mg/dL (ref 0.60–1.10)
GFR, Estimated: 55 mL/min — ABNORMAL LOW (ref >60)
Glucose, Bld: 148 mg/dL — ABNORMAL HIGH (ref 70–140)
POTASSIUM: 5.2 mmol/L — AB (ref 3.5–5.1)
SODIUM: 137 mmol/L (ref 136–145)
TOTAL PROTEIN: 7.8 g/dL (ref 6.4–8.3)
Total Bilirubin: 0.3 mg/dL (ref 0.2–1.2)

## 2017-12-31 LAB — CBC WITH DIFFERENTIAL (CANCER CENTER ONLY)
BASOS ABS: 0 10*3/uL (ref 0.0–0.1)
BASOS PCT: 0 %
Eosinophils Absolute: 0 10*3/uL (ref 0.0–0.5)
Eosinophils Relative: 0 %
HEMATOCRIT: 41.7 % (ref 34.8–46.6)
HEMOGLOBIN: 13.2 g/dL (ref 11.6–15.9)
Lymphocytes Relative: 5 %
Lymphs Abs: 0.6 10*3/uL — ABNORMAL LOW (ref 0.9–3.3)
MCH: 26.4 pg (ref 25.1–34.0)
MCHC: 31.7 g/dL (ref 31.5–36.0)
MCV: 83.4 fL (ref 79.5–101.0)
MONOS PCT: 1 %
Monocytes Absolute: 0.1 10*3/uL (ref 0.1–0.9)
NEUTROS ABS: 10.7 10*3/uL — AB (ref 1.5–6.5)
Neutrophils Relative %: 94 %
Platelet Count: 459 10*3/uL — ABNORMAL HIGH (ref 145–400)
RBC: 5 MIL/uL (ref 3.70–5.45)
RDW: 15.9 % — ABNORMAL HIGH (ref 11.2–14.5)
WBC Count: 11.5 10*3/uL — ABNORMAL HIGH (ref 3.9–10.3)

## 2017-12-31 LAB — IRON AND TIBC
Iron: 19 ug/dL — ABNORMAL LOW (ref 41–142)
Saturation Ratios: 5 % — ABNORMAL LOW (ref 21–57)
TIBC: 369 ug/dL (ref 236–444)
UIBC: 350 ug/dL

## 2017-12-31 LAB — FERRITIN: FERRITIN: 19 ng/mL (ref 9–269)

## 2017-12-31 NOTE — Progress Notes (Addendum)
[place]  [phone] [phone]  Clinic Follow up Note   Patient Care Team: Hoyt Koch, MD as PCP - General (Internal Medicine) [name], MD as Consulting Physician (Gastroenterology) 01/02/2018  DIAGNOSIS: Iron deficiency anemia  HISTORY OF PRESENT ILLNESS:  Patient was initially seen by Dr. Lebron Conners on 11/21/17 for thrombocytosis, leukocytosis and decreased hemoglobin. Available records indicate chronic leukocytosis dating back to 05/2011 and thrombocytopenia in 08/2011 in the setting of active crohn's colitis diagnosed in 2000 s/p multiple treatments and on chronic steroids. She was referred for down trending hemoglobin which from what I can tell has remained in low-normal range around 13 recently. She has been under long term care of Dr. Carlean Purl for Crohn's. Labs checked at Dr. Clydene Laming initial consult on 11/21/17 revealed ferritin level 16, serum iron 26, and 8% saturation, consistent with iron deficiency anemia. She was initiated on oral iron therapy ferrous sulfate 1 tab TID with meals on 12/02/17. Thrombocytosis was felt to be reactive from iron deficiency anemia and inflammation. Leukocytosis was attributed to reaction to prednisone.   She reports her iron studies have never been checked previously.  She has a history of blood transfusion without associated diagnosis in 1966.  She does not recall the reason or surrounding events.  Denies heavy NSAID use.  Denies pica.  She is under care of Dr. Arelia Longest in GI for Crohn's disease, last flex sigmoid and 06/12/2015.  PMH includes HTN, chronic back and leg pain, arthritis, osteopenia, and GERD.  She is retired, lives with her son.  She uses a cane today but is independent of all ADLs.  She drives herself.  She feels safe in the home.  Family history is positive for mother with leukemia, maternal grandfather with leukemia, paternal aunt with ovarian cancer, another paternal aunt with breast  cancer, and another maternal aunt with throat cancer.   INTERVAL HISTORY: Ms. Sandiford returns for follow-up as scheduled.  She presents with her sister.  Se was prescribed ferrous sulfate 1 tab 3 times daily per Dr. Lebron Conners on 12/02/2017.  She takes 2/day consistently, occasionally 3 but she often misses the lunchtime dose.  She tolerates without constipation.  She has average 2-3 BM per day with occasional diarrhea secondary to Crohn's disease.  For the last week has had daily small amount of blood with BM, she notes this is chronic intermittent problem for her.  She was instructed to call Dr. Evern Bio office during last follow-up with Dr. Lebron Conners but has not done so. She continues prednisone for crohn's disease, fluctuates between 10 to 20 mg daily depending on BM and rectal bleeding. She has fatigue at baseline but slightly increased lately.   REVIEW OF SYSTEMS:   Constitutional: Denies fevers, chills or abnormal weight loss (+) fatigue  Eyes: Denies blurriness of vision Ears, nose, mouth, throat, and face: Denies mucositis or sore throat Respiratory: Denies cough or wheezes (+) DOE Cardiovascular: Denies palpitation, chest discomfort or lower extremity swelling Gastrointestinal:  Denies nausea, vomiting, constipation (+) GERD (+) Crohn's disease - 2-3 BM per day with occasional diarrhea (+) 1 week h/o rectal bleeding with BM (+) h/o chronic intermittent rectal bleeding  Skin: Denies abnormal skin rashes Lymphatics: Denies new lymphadenopathy or easy bruising Neurological:Denies numbness, tingling, headaches, or new weaknesses (+) occasional dizziness (+) mild hot flashes  Behavioral/Psych: Mood is stable, no new changes (+) forgetful (+) difficulty sleeping  All other systems were reviewed with the patient and are negative.  MEDICAL HISTORY:  Past Medical History:  Diagnosis Date  . Allergy    year around allergies   . Arthritis    shoulder  . Bell's palsy   . Blood transfusion    1966    . Blood transfusion without reported diagnosis   . Cataract    bilaterally removed  . Colon polyps    adenomatous polyp June 2012  . Complication of anesthesia    slow to wake  . Crohn's colitis ([place])   . Dry eyes    left eye worst one  . GERD (gastroesophageal reflux disease)   . Headache(784.0)    hx migraines  . Hypertension   . Shortness of breath     SURGICAL HISTORY: Past Surgical History:  Procedure Laterality Date  . ABDOMINAL HYSTERECTOMY    . APPENDECTOMY    . CATARACT EXTRACTION Bilateral    Separate dates  . CHOLECYSTECTOMY    . COLONOSCOPY  multiple  . flex sigmoidoscopy with biospy  01/17/12  . FLEXIBLE SIGMOIDOSCOPY  06/08/2011   severe Crohn's colitis to descending colon at least  . FOOT GANGLION EXCISION     left  . SHOULDER SURGERY     right  . TONSILLECTOMY      I have reviewed the social history and family history with the patient and they are unchanged from previous note.  ALLERGIES:  is allergic to azathioprine; entyvio [vedolizumab]; adhesive [tape]; and sulfa antibiotics.  MEDICATIONS:  Current Outpatient Medications  Medication Sig Dispense Refill  . b complex vitamins capsule Take 1 capsule by mouth daily.      Marland Kitchen BYSTOLIC 10 MG tablet Take 1 tablet (10 mg total) by mouth daily. Annual appt is due must see provider for future refills 90 tablet 0  . cetirizine (ZYRTEC) 10 MG tablet Take 10 mg by mouth daily as needed for allergies.     Marland Kitchen dicyclomine (BENTYL) 20 MG tablet TAKE 1 TABLET (20 MG TOTAL) BY MOUTH 3 (THREE) TIMES DAILY. FOR STOMACH PAIN 90 tablet 0  . diphenoxylate-atropine (LOMOTIL) 2.5-0.025 MG tablet TAKE 1 TO 2 TABLETS BY MOUTH EVERY 4 HOURS AS NEEDED FOR DIARRHEA 90 tablet 0  . famotidine (PEPCID) 20 MG tablet Take 20 mg by mouth 2 (two) times daily.    Marland Kitchen gabapentin (NEURONTIN) 300 MG capsule Take 300 mg by mouth 3 (three) times daily.    . mesalamine (CANASA) 1000 MG suppository Place 1 suppository (1,000 mg total) rectally at  bedtime. 30 suppository 1  . mesalamine (ROWASA) 4 g enema Use 1 enema at bedtime. 30 Bottle 1  . Multiple Vitamins-Minerals (MULTIVITAMINS THER. W/MINERALS) TABS Take 1 tablet by mouth daily as needed.     . ondansetron (ZOFRAN) 4 MG tablet Take 1 tablet (4 mg total) by mouth every 8 (eight) hours as needed for nausea or vomiting. 30 tablet 1  . pantoprazole (PROTONIX) 40 MG tablet Take 1 tablet (40 mg total) by mouth daily. 90 tablet 3  . predniSONE (DELTASONE) 10 MG tablet Take 1 tablet (10 mg total) by mouth daily. 100 tablet 1  . predniSONE (DELTASONE) 10 MG tablet Take 4 tabs x 7 days, take 3 tabs x 7 days, take 2 tabs x 7, then 10 mg tablets daily. 100 tablet 1  . traMADol (ULTRAM) 50 MG tablet TAKE 1 TABLET BY MOUTH EVERY 8 HOURS AS NEEDED 60 tablet 1  . ferrous sulfate 325 (65 FE) MG EC tablet Take 1 tablet (325 mg total) by mouth 3 (three) times daily with  meals. 90 tablet 3   No current facility-administered medications for this visit.     PHYSICAL EXAMINATION: ECOG PERFORMANCE STATUS: 1 - Symptomatic but completely ambulatory  Vitals:   01/02/18 1221  BP: (!) 198/97  Pulse: 81  Resp: 18  Temp: 97.9 F (36.6 C)  SpO2: 99%   Filed Weights   01/02/18 1221  Weight: 176 lb 8 oz (80.1 kg)    GENERAL:alert, no distress and comfortable SKIN: no rashes or significant lesions EYES: normal, Conjunctiva are pink and non-injected, sclera clear OROPHARYNX:no thrush or ulcers  LYMPH:  no palpable cervical, supraclavicular, or axillary lymphadenopathy LUNGS: clear to auscultation with normal breathing effort HEART: regular rate & rhythm and no murmurs and no lower extremity edema ABDOMEN:abdomen soft, non-tender and normal bowel sounds. No hepatosplenomegaly  Musculoskeletal:no cyanosis of digits and no clubbing  NEURO: alert & oriented x 3 with fluent speech, no focal motor/sensory deficits  LABORATORY DATA:  I have reviewed the data as listed CBC Latest Ref Rng & Units  12/31/2017 11/21/2017 10/31/2017  WBC 3.9 - 10.3 K/uL 11.5(H) 18.1(H) 14.3(H)  Hemoglobin 11.6 - 15.9 g/dL 13.2 13.1 13.0  Hematocrit 34.8 - 46.6 % 41.7 41.9 40.6  Platelets 145 - 400 K/uL 459(H) 421(H) 724.0(H)     CMP Latest Ref Rng & Units 12/31/2017 11/21/2017 10/31/2017  Glucose 70 - 140 mg/dL 148(H) 124 140(H)  BUN 7 - 26 mg/dL 12 15 12   Creatinine 0.60 - 1.10 mg/dL 0.98 0.93 0.96  Sodium 136 - 145 mmol/L 137 133(L) 134(L)  Potassium 3.5 - 5.1 mmol/L 5.2(H) 3.5 3.8  Chloride 98 - 109 mmol/L 104 102 99  CO2 22 - 29 mmol/L 26 21(L) 27  Calcium 8.4 - 10.4 mg/dL 9.8 9.4 9.3  Total Protein 6.4 - 8.3 g/dL 7.8 7.4 7.5  Total Bilirubin 0.2 - 1.2 mg/dL 0.3 0.4 0.3  Alkaline Phos 40 - 150 U/L 113 98 94  AST 5 - 34 U/L 36(H) 23 13  ALT 0 - 55 U/L 33 21 10      RADIOGRAPHIC STUDIES: I have personally reviewed the radiological images as listed and agreed with the findings in the report. No results found.   ASSESSMENT & PLAN: 77 yo WF with Crohn's disease and newly diagnosed iron deficiency without anemia  1. Iron deficiency without anemia -She was found to have downtrending Hgb on serial CBC, iron studies had never been checked per available records.  -Iron studies 11/21/17 revealed ferritin 16, serum iron 26, and 8% saturation, consistent with iron deficiency. She was started on oral iron 1 tab TID, she consistently takes 2 tabs daily.  -Serum iron today has decreased to 19, 5% sat, and ferritin 19. Appears she is not absorbing oral iron well. Dr. Burr Medico recommends IV Feraheme x1; we reviewed the risks and benefits especially rare but serious chance of anaphylaxis, she agrees to proceed. She will continue oral iron.  -Will repeat lab monthly and f/u in 3 months   2. Leukocytosis, thrombocytosis  -Dr. Burr Medico reviewed her leukocytosis is likely reactive to crohn's colitis and prednisone therapy; she feels thrombocytosis is reactive to iron deficiency  -Will recheck labs after IV iron therapy, if  thrombocytosis does not improve, will explore other causes and rule out ET.  3. Crohn's disease -Long term patient of Dr. Carlean Purl. She has failed multiple therapies, has been maintained on prednisone for many years, usually 10-20 mg daily.  -She was previously encouraged by Dr. Lebron Conners and again by Dr. Burr Medico and  I today to f/u with Dr. Carlean Purl for further evaluation of recent rectal bleeding and ongoing care for crohn's disease.   4. Osteopenia  -last DEXA 2015 T score -1.5 at femoral neck; she was instructed to take calcium and vitamin D.  -She reports to only taking vitamin D, I encouraged her to add calcium and explained there are tablets that contain both components so she doesn't have to take extra tablets. She agrees.   5. HTN -On Bystolic per PCP; blood pressure consistently high. 188/98 on repeat during iron infusion. She will need to consult with PCP for possible med adjustment.  PLAN: -IV Feraheme x1 today -Lab monthly x3 -F/u with Monetta Lick or Dr. Burr Medico in 3 months  -Patient to call Dr. Celesta Aver office for f/u   Orders Placed This Encounter  Procedures  . CBC with Differential (Cancer Center Only)    Standing Status:   Standing    Number of Occurrences:   100    Standing Expiration Date:   01/03/2024  . CMP ([facility] only)    Standing Status:   Standing    Number of Occurrences:   100    Standing Expiration Date:   01/03/2024  . Iron and TIBC    Standing Status:   Standing    Number of Occurrences:   100    Standing Expiration Date:   01/03/2024  . Ferritin    Standing Status:   Standing    Number of Occurrences:   100    Standing Expiration Date:   01/03/2024   All questions were answered. The patient knows to call the clinic with any problems, questions or concerns. No barriers to learning was detected.     Alla Feeling, NP 01/02/18   Addendum  I have seen the patient, examined her. I agree with the assessment and and plan and have edited the notes.   I have  reviewed Ms Brodman's medical records including lab results. She has lab evidence of iron deficiency without anemia, which is likely secondary to her Crohn disease. She has been taking oral iron twice a day for a month, but her repeated iron level is still pretty low. She is quite fatigued, I recommend a trial of iv feraheme to see if her fatigue, and thrombocytosis resolves. Potential side effects, especially allergy reaction, were discussed with her and she agrees to proceed.   I think her thrombocytosis and leukocytosis are likely reactive, secondary to iron deficiency and Crohn disease, much less likely MPN such as ET. If her thrombocytosis does not resolve after adequate iron replacement, I will consider checking JAK2 mutation.  Truitt Merle  01/02/2018

## 2018-01-02 ENCOUNTER — Ambulatory Visit: Payer: Medicare Other

## 2018-01-02 ENCOUNTER — Inpatient Hospital Stay: Payer: Medicare Other

## 2018-01-02 ENCOUNTER — Encounter: Payer: Self-pay | Admitting: Nurse Practitioner

## 2018-01-02 ENCOUNTER — Inpatient Hospital Stay (HOSPITAL_BASED_OUTPATIENT_CLINIC_OR_DEPARTMENT_OTHER): Payer: Medicare Other | Admitting: Nurse Practitioner

## 2018-01-02 ENCOUNTER — Telehealth: Payer: Self-pay | Admitting: Nurse Practitioner

## 2018-01-02 VITALS — BP 198/97 | HR 81 | Temp 97.9°F | Resp 18 | Ht 65.5 in | Wt 176.5 lb

## 2018-01-02 VITALS — BP 188/98

## 2018-01-02 VITALS — BP 182/94

## 2018-01-02 DIAGNOSIS — D509 Iron deficiency anemia, unspecified: Secondary | ICD-10-CM | POA: Diagnosis not present

## 2018-01-02 DIAGNOSIS — M8580 Other specified disorders of bone density and structure, unspecified site: Secondary | ICD-10-CM

## 2018-01-02 DIAGNOSIS — Z79899 Other long term (current) drug therapy: Secondary | ICD-10-CM

## 2018-01-02 DIAGNOSIS — R5383 Other fatigue: Secondary | ICD-10-CM

## 2018-01-02 DIAGNOSIS — R7989 Other specified abnormal findings of blood chemistry: Secondary | ICD-10-CM

## 2018-01-02 DIAGNOSIS — Z86010 Personal history of colonic polyps: Secondary | ICD-10-CM

## 2018-01-02 DIAGNOSIS — M129 Arthropathy, unspecified: Secondary | ICD-10-CM | POA: Diagnosis not present

## 2018-01-02 DIAGNOSIS — K219 Gastro-esophageal reflux disease without esophagitis: Secondary | ICD-10-CM | POA: Diagnosis not present

## 2018-01-02 DIAGNOSIS — Z806 Family history of leukemia: Secondary | ICD-10-CM | POA: Diagnosis not present

## 2018-01-02 DIAGNOSIS — Z8041 Family history of malignant neoplasm of ovary: Secondary | ICD-10-CM

## 2018-01-02 DIAGNOSIS — I10 Essential (primary) hypertension: Secondary | ICD-10-CM

## 2018-01-02 DIAGNOSIS — M549 Dorsalgia, unspecified: Secondary | ICD-10-CM

## 2018-01-02 DIAGNOSIS — D500 Iron deficiency anemia secondary to blood loss (chronic): Secondary | ICD-10-CM

## 2018-01-02 DIAGNOSIS — K5090 Crohn's disease, unspecified, without complications: Secondary | ICD-10-CM

## 2018-01-02 DIAGNOSIS — D72829 Elevated white blood cell count, unspecified: Secondary | ICD-10-CM | POA: Diagnosis not present

## 2018-01-02 DIAGNOSIS — D696 Thrombocytopenia, unspecified: Secondary | ICD-10-CM | POA: Diagnosis not present

## 2018-01-02 MED ORDER — SODIUM CHLORIDE 0.9 % IV SOLN
Freq: Once | INTRAVENOUS | Status: AC
  Administered 2018-01-02: 14:00:00 via INTRAVENOUS

## 2018-01-02 MED ORDER — FERUMOXYTOL INJECTION 510 MG/17 ML
510.0000 mg | Freq: Once | INTRAVENOUS | Status: AC
  Administered 2018-01-02: 510 mg via INTRAVENOUS
  Filled 2018-01-02: qty 17

## 2018-01-02 NOTE — Patient Instructions (Signed)

## 2018-01-02 NOTE — Progress Notes (Signed)
Please advise on blood pressure reading. 182/94.

## 2018-01-02 NOTE — Telephone Encounter (Signed)
Appointments scheduled AVS/Calendar printed per 6/13 los

## 2018-01-10 ENCOUNTER — Encounter: Payer: Self-pay | Admitting: Internal Medicine

## 2018-01-10 ENCOUNTER — Ambulatory Visit (INDEPENDENT_AMBULATORY_CARE_PROVIDER_SITE_OTHER): Payer: Medicare Other | Admitting: Internal Medicine

## 2018-01-10 ENCOUNTER — Other Ambulatory Visit: Payer: Medicare Other

## 2018-01-10 VITALS — BP 130/80 | HR 102 | Temp 97.8°F | Ht 65.5 in | Wt 176.0 lb

## 2018-01-10 DIAGNOSIS — Z0000 Encounter for general adult medical examination without abnormal findings: Secondary | ICD-10-CM | POA: Diagnosis not present

## 2018-01-10 DIAGNOSIS — D500 Iron deficiency anemia secondary to blood loss (chronic): Secondary | ICD-10-CM | POA: Diagnosis not present

## 2018-01-10 DIAGNOSIS — T50905A Adverse effect of unspecified drugs, medicaments and biological substances, initial encounter: Secondary | ICD-10-CM

## 2018-01-10 DIAGNOSIS — E2839 Other primary ovarian failure: Secondary | ICD-10-CM | POA: Diagnosis not present

## 2018-01-10 DIAGNOSIS — M8580 Other specified disorders of bone density and structure, unspecified site: Secondary | ICD-10-CM

## 2018-01-10 NOTE — Assessment & Plan Note (Signed)
Getting iron infusions and this has helped her fatigue some. She is still having fatigue but improved. Will follow labs and her high platelet level has decreased some recently.

## 2018-01-10 NOTE — Assessment & Plan Note (Signed)
Flu shot yearly. Pneumonia complete. Shingrix counseled. Tetanus up to date. Colonoscopy up to date. Mammogram up to date, pap smear not indicated and dexa ordered. Counseled about sun safety and mole surveillance. Counseled about the dangers of distracted driving. Given 10 year screening recommendations.

## 2018-01-10 NOTE — Assessment & Plan Note (Signed)
Due to prednisone. Needs repeat dexa since she was not able to transition from chronic prednisone. Ordered Dexa today.

## 2018-01-10 NOTE — Progress Notes (Signed)
   Subjective:    Patient ID: [place], female    DOB: 1941/06/19, 77 y.o.   MRN: [id number]  HPI Here for medicare wellness, no new complaints. Please see A/P for status and treatment of chronic medical problems.   HPI #2: Needs follow up of osteopenia (on chronic prednisone which makes her high risk for progression, last dexa with -1.5 and 5 years ago, denies falls or fractures) and her fatigue (seeing hematology, having treatment for her high platelets, this has helped some, still mild fatigue).   Diet: heart healthy Physical activity: sedentary Depression/mood screen: negative Hearing: intact to whispered voice Visual acuity: grossly normal, performs annual eye exam  ADLs: capable Fall risk: none Home safety: good Cognitive evaluation: intact to orientation, naming, recall and repetition EOL planning: adv directives discussed  I have personally reviewed and have noted 1. The patient's medical and social history - reviewed today no changes 2. Their use of alcohol, tobacco or illicit drugs 3. Their current medications and supplements 4. The patient's functional ability including ADL's, fall risks, home safety risks and hearing or visual impairment. 5. Diet and physical activities 6. Evidence for depression or mood disorders 7. Care team reviewed and updated (available in snapshot)  Review of Systems  Constitutional: Positive for fatigue.  HENT: Negative.   Eyes: Negative.   Respiratory: Negative for cough, chest tightness and shortness of breath.   Cardiovascular: Negative for chest pain, palpitations and leg swelling.  Gastrointestinal: Negative for abdominal distention, abdominal pain, constipation, diarrhea, nausea and vomiting.  Musculoskeletal: Negative.   Skin: Negative.   Neurological: Negative.   Psychiatric/Behavioral: Negative.       Objective:   Physical Exam  Constitutional: She is oriented to person, place, and time. She appears well-developed and  well-nourished.  HENT:  Head: Normocephalic and atraumatic.  Eyes: EOM are normal.  Neck: Normal range of motion.  Cardiovascular: Normal rate and regular rhythm.  Pulmonary/Chest: Effort normal and breath sounds normal. No respiratory distress. She has no wheezes. She has no rales.  Abdominal: Soft. Bowel sounds are normal. She exhibits no distension. There is no tenderness. There is no rebound.  Musculoskeletal: She exhibits no edema.  Neurological: She is alert and oriented to person, place, and time. Coordination normal.  Skin: Skin is warm and dry.  Psychiatric: She has a normal mood and affect.   Vitals:   01/10/18 1431  BP: 130/80  Pulse: (!) 102  Temp: 97.8 F (36.6 C)  TempSrc: Oral  SpO2: 97%  Weight: 176 lb (79.8 kg)  Height: 5' 5.5" (1.664 m)      Assessment & Plan:

## 2018-01-10 NOTE — Patient Instructions (Signed)
We are going to check the bone density test again to follow up on the bones.   Health Maintenance, Female Adopting a healthy lifestyle and getting preventive care can go a long way to promote health and wellness. Talk with your health care provider about what schedule of regular examinations is right for you. This is a good chance for you to check in with your provider about disease prevention and staying healthy. In between checkups, there are plenty of things you can do on your own. Experts have done a lot of research about which lifestyle changes and preventive measures are most likely to keep you healthy. Ask your health care provider for more information. Weight and diet Eat a healthy diet  Be sure to include plenty of vegetables, fruits, low-fat dairy products, and lean protein.  Do not eat a lot of foods high in solid fats, added sugars, or salt.  Get regular exercise. This is one of the most important things you can do for your health. ? Most adults should exercise for at least 150 minutes each week. The exercise should increase your heart rate and make you sweat (moderate-intensity exercise). ? Most adults should also do strengthening exercises at least twice a week. This is in addition to the moderate-intensity exercise.  Maintain a healthy weight  Body mass index (BMI) is a measurement that can be used to identify possible weight problems. It estimates body fat based on height and weight. Your health care provider can help determine your BMI and help you achieve or maintain a healthy weight.  For females 76 years of age and older: ? A BMI below 18.5 is considered underweight. ? A BMI of 18.5 to 24.9 is normal. ? A BMI of 25 to 29.9 is considered overweight. ? A BMI of 30 and above is considered obese.  Watch levels of cholesterol and blood lipids  You should start having your blood tested for lipids and cholesterol at 76 years of age, then have this test every 5 years.  You  may need to have your cholesterol levels checked more often if: ? Your lipid or cholesterol levels are high. ? You are older than 76 years of age. ? You are at high risk for heart disease.  Cancer screening Lung Cancer  Lung cancer screening is recommended for adults 76-82 years old who are at high risk for lung cancer because of a history of smoking.  A yearly low-dose CT scan of the lungs is recommended for people who: ? Currently smoke. ? Have quit within the past 15 years. ? Have at least a 30-pack-year history of smoking. A pack year is smoking an average of one pack of cigarettes a day for 1 year.  Yearly screening should continue until it has been 15 years since you quit.  Yearly screening should stop if you develop a health problem that would prevent you from having lung cancer treatment.  Breast Cancer  Practice breast self-awareness. This means understanding how your breasts normally appear and feel.  It also means doing regular breast self-exams. Let your health care provider know about any changes, no matter how small.  If you are in your 20s or 30s, you should have a clinical breast exam (CBE) by a health care provider every 1-3 years as part of a regular health exam.  If you are 65 or older, have a CBE every year. Also consider having a breast X-ray (mammogram) every year.  If you have a family history of  breast cancer, talk to your health care provider about genetic screening.  If you are at high risk for breast cancer, talk to your health care provider about having an MRI and a mammogram every year.  Breast cancer gene (BRCA) assessment is recommended for women who have family members with BRCA-related cancers. BRCA-related cancers include: ? Breast. ? Ovarian. ? Tubal. ? Peritoneal cancers.  Results of the assessment will determine the need for genetic counseling and BRCA1 and BRCA2 testing.  Cervical Cancer Your health care provider may recommend that you  be screened regularly for cancer of the pelvic organs (ovaries, uterus, and vagina). This screening involves a pelvic examination, including checking for microscopic changes to the surface of your cervix (Pap test). You may be encouraged to have this screening done every 3 years, beginning at age 30.  For women ages 35-65, health care providers may recommend pelvic exams and Pap testing every 3 years, or they may recommend the Pap and pelvic exam, combined with testing for human papilloma virus (HPV), every 5 years. Some types of HPV increase your risk of cervical cancer. Testing for HPV may also be done on women of any age with unclear Pap test results.  Other health care providers may not recommend any screening for nonpregnant women who are considered low risk for pelvic cancer and who do not have symptoms. Ask your health care provider if a screening pelvic exam is right for you.  If you have had past treatment for cervical cancer or a condition that could lead to cancer, you need Pap tests and screening for cancer for at least 20 years after your treatment. If Pap tests have been discontinued, your risk factors (such as having a new sexual partner) need to be reassessed to determine if screening should resume. Some women have medical problems that increase the chance of getting cervical cancer. In these cases, your health care provider may recommend more frequent screening and Pap tests.  Colorectal Cancer  This type of cancer can be detected and often prevented.  Routine colorectal cancer screening usually begins at 76 years of age and continues through 76 years of age.  Your health care provider may recommend screening at an earlier age if you have risk factors for colon cancer.  Your health care provider may also recommend using home test kits to check for hidden blood in the stool.  A small camera at the end of a tube can be used to examine your colon directly (sigmoidoscopy or  colonoscopy). This is done to check for the earliest forms of colorectal cancer.  Routine screening usually begins at age 76.  Direct examination of the colon should be repeated every 5-10 years through 76 years of age. However, you may need to be screened more often if early forms of precancerous polyps or small growths are found.  Skin Cancer  Check your skin from head to toe regularly.  Tell your health care provider about any new moles or changes in moles, especially if there is a change in a mole's shape or color.  Also tell your health care provider if you have a mole that is larger than the size of a pencil eraser.  Always use sunscreen. Apply sunscreen liberally and repeatedly throughout the day.  Protect yourself by wearing long sleeves, pants, a wide-brimmed hat, and sunglasses whenever you are outside.  Heart disease, diabetes, and high blood pressure  High blood pressure causes heart disease and increases the risk of stroke. High blood  pressure is more likely to develop in: ? People who have blood pressure in the high end of the normal range (130-139/85-89 mm Hg). ? People who are overweight or obese. ? People who are African American.  If you are 31-46 years of age, have your blood pressure checked every 3-5 years. If you are 69 years of age or older, have your blood pressure checked every year. You should have your blood pressure measured twice-once when you are at a hospital or clinic, and once when you are not at a hospital or clinic. Record the average of the two measurements. To check your blood pressure when you are not at a hospital or clinic, you can use: ? An automated blood pressure machine at a pharmacy. ? A home blood pressure monitor.  If you are between 54 years and 76 years old, ask your health care provider if you should take aspirin to prevent strokes.  Have regular diabetes screenings. This involves taking a blood sample to check your fasting blood sugar  level. ? If you are at a normal weight and have a low risk for diabetes, have this test once every three years after 76 years of age. ? If you are overweight and have a high risk for diabetes, consider being tested at a younger age or more often. Preventing infection Hepatitis B  If you have a higher risk for hepatitis B, you should be screened for this virus. You are considered at high risk for hepatitis B if: ? You were born in a country where hepatitis B is common. Ask your health care provider which countries are considered high risk. ? Your parents were born in a high-risk country, and you have not been immunized against hepatitis B (hepatitis B vaccine). ? You have HIV or AIDS. ? You use needles to inject street drugs. ? You live with someone who has hepatitis B. ? You have had sex with someone who has hepatitis B. ? You get hemodialysis treatment. ? You take certain medicines for conditions, including cancer, organ transplantation, and autoimmune conditions.  Hepatitis C  Blood testing is recommended for: ? Everyone born from [birth year] through 1964. ? Anyone with known risk factors for hepatitis C.  Sexually transmitted infections (STIs)  You should be screened for sexually transmitted infections (STIs) including gonorrhea and chlamydia if: ? You are sexually active and are younger than 76 years of age. ? You are older than 76 years of age and your health care provider tells you that you are at risk for this type of infection. ? Your sexual activity has changed since you were last screened and you are at an increased risk for chlamydia or gonorrhea. Ask your health care provider if you are at risk.  If you do not have HIV, but are at risk, it may be recommended that you take a prescription medicine daily to prevent HIV infection. This is called pre-exposure prophylaxis (PrEP). You are considered at risk if: ? You are sexually active and do not regularly use condoms or know the HIV  status of your partner(s). ? You take drugs by injection. ? You are sexually active with a partner who has HIV.  Talk with your health care provider about whether you are at high risk of being infected with HIV. If you choose to begin PrEP, you should first be tested for HIV. You should then be tested every 3 months for as long as you are taking PrEP. Pregnancy  If you are premenopausal  and you may become pregnant, ask your health care provider about preconception counseling.  If you may become pregnant, take 400 to 800 micrograms (mcg) of folic acid every day.  If you want to prevent pregnancy, talk to your health care provider about birth control (contraception). Osteoporosis and menopause  Osteoporosis is a disease in which the bones lose minerals and strength with aging. This can result in serious bone fractures. Your risk for osteoporosis can be identified using a bone density scan.  If you are 56 years of age or older, or if you are at risk for osteoporosis and fractures, ask your health care provider if you should be screened.  Ask your health care provider whether you should take a calcium or vitamin D supplement to lower your risk for osteoporosis.  Menopause may have certain physical symptoms and risks.  Hormone replacement therapy may reduce some of these symptoms and risks. Talk to your health care provider about whether hormone replacement therapy is right for you. Follow these instructions at home:  Schedule regular health, dental, and eye exams.  Stay current with your immunizations.  Do not use any tobacco products including cigarettes, chewing tobacco, or electronic cigarettes.  If you are pregnant, do not drink alcohol.  If you are breastfeeding, limit how much and how often you drink alcohol.  Limit alcohol intake to no more than 1 drink per day for nonpregnant women. One drink equals 12 ounces of beer, 5 ounces of wine, or 1 ounces of hard liquor.  Do not  use street drugs.  Do not share needles.  Ask your health care provider for help if you need support or information about quitting drugs.  Tell your health care provider if you often feel depressed.  Tell your health care provider if you have ever been abused or do not feel safe at home. This information is not intended to replace advice given to you by your health care provider. Make sure you discuss any questions you have with your health care provider. Document Released: 01/22/2011 Document Revised: 12/15/2015 Document Reviewed: 04/12/2015 Elsevier Interactive Patient Education  [employer].

## 2018-01-15 ENCOUNTER — Ambulatory Visit (INDEPENDENT_AMBULATORY_CARE_PROVIDER_SITE_OTHER)
Admission: RE | Admit: 2018-01-15 | Discharge: 2018-01-15 | Disposition: A | Discharge status: Home / Self Care | Payer: Medicare Other | Source: Ambulatory Visit | Attending: Internal Medicine | Admitting: Internal Medicine

## 2018-01-15 DIAGNOSIS — T50905A Adverse effect of unspecified drugs, medicaments and biological substances, initial encounter: Secondary | ICD-10-CM

## 2018-01-15 DIAGNOSIS — E2839 Other primary ovarian failure: Secondary | ICD-10-CM

## 2018-01-15 DIAGNOSIS — M8580 Other specified disorders of bone density and structure, unspecified site: Secondary | ICD-10-CM

## 2018-01-23 ENCOUNTER — Other Ambulatory Visit: Payer: Self-pay | Admitting: Gastroenterology

## 2018-01-24 ENCOUNTER — Ambulatory Visit: Payer: Self-pay | Admitting: *Deleted

## 2018-01-24 ENCOUNTER — Other Ambulatory Visit: Payer: Self-pay | Admitting: Family

## 2018-01-24 MED ORDER — HYDROCHLOROTHIAZIDE 25 MG PO TABS: 25.0000 mg | ORAL_TABLET | Freq: Every day | ORAL | 0 refills | Status: DC

## 2018-01-24 NOTE — Telephone Encounter (Signed)
Patient states she [place] out of her fluid [place] the [place]. Patient was in for her annual exam and the medication was not updated on her current medication list. Patient states she has been taking this pill for years.She states her foot is swelling bad and she needs it sent over.

## 2018-01-24 NOTE — Telephone Encounter (Signed)
Patient informed of [name] [name] response and stated understanding

## 2018-01-24 NOTE — Telephone Encounter (Signed)
Called patient and she states that she has been taking Hydrochlorothiazide 39m 1 tablet by mouth daily. Please advise

## 2018-01-24 NOTE — Telephone Encounter (Signed)
Will send in one month refill until she gets back with Dr. Sharlet Salina; please make sure she understands to go somewhere over the weekend if the swelling persists.

## 2018-01-24 NOTE — Telephone Encounter (Addendum)
Patient states she is having swelling due to lack of fluid pill- please review for refill of medication. Reason for Disposition . [1] MODERATE leg swelling (e.g., swelling extends up to knees) AND [2] new onset or worsening  Answer Assessment - Initial Assessment Questions 1. ONSET: "When did the swelling start?" (e.g., minutes, hours, days)     Last week- 3 days ago- but today it is worse 2. LOCATION: "What part of the leg is swollen?"  "Are both legs swollen or just one leg?"     Calf- mainly feet and ankles, both sides- started on L and now R has started 3. SEVERITY: "How bad is the swelling?" (e.g., localized; mild, moderate, severe)  - Localized - small area of swelling localized to one leg  - MILD pedal edema - swelling limited to foot and ankle, pitting edema < 1/4 inch (6 mm) deep, rest and elevation eliminate most or all swelling  - MODERATE edema - swelling of lower leg to knee, pitting edema > 1/4 inch (6 mm) deep, rest and elevation only partially reduce swelling  - SEVERE edema - swelling extends above knee, facial or hand swelling present      moderate 4. REDNESS: "Does the swelling look red or infected?"     no 5. PAIN: "Is the swelling painful to touch?" If so, ask: "How painful is it?"   (Scale 1-10; mild, moderate or severe)     No pain- some numbness 6. FEVER: "Do you have a fever?" If so, ask: "What is it, how was it measured, and when did it start?"      no 7. CAUSE: "What do you think is causing the leg swelling?"     Patient is out of mediaction 8. MEDICAL HISTORY: "Do you have a history of heart failure, kidney disease, liver failure, or cancer?"     Heart disease 9. RECURRENT SYMPTOM: "Have you had leg swelling before?" If so, ask: "When was the last time?" "What happened that time?"     Yes- fluid pill 10. OTHER SYMPTOMS: "Do you have any other symptoms?" (e.g., chest pain, difficulty breathing)       no 11. PREGNANCY: "Is there any chance you are pregnant?"  "When was your last menstrual period?"       n/a  Protocols used: LEG SWELLING AND EDEMA-A-AH

## 2018-01-27 ENCOUNTER — Other Ambulatory Visit (INDEPENDENT_AMBULATORY_CARE_PROVIDER_SITE_OTHER): Payer: Medicare Other

## 2018-01-27 ENCOUNTER — Encounter: Payer: Self-pay | Admitting: Internal Medicine

## 2018-01-27 ENCOUNTER — Ambulatory Visit (INDEPENDENT_AMBULATORY_CARE_PROVIDER_SITE_OTHER): Payer: Medicare Other | Admitting: Internal Medicine

## 2018-01-27 VITALS — BP 128/78 | HR 84 | Temp 98.0°F | Ht 65.5 in | Wt 181.0 lb

## 2018-01-27 DIAGNOSIS — I872 Venous insufficiency (chronic) (peripheral): Secondary | ICD-10-CM | POA: Diagnosis not present

## 2018-01-27 DIAGNOSIS — R0602 Shortness of breath: Secondary | ICD-10-CM | POA: Diagnosis not present

## 2018-01-27 LAB — BRAIN NATRIURETIC PEPTIDE: PRO B NATRI PEPTIDE: 104 pg/mL — AB (ref 0.0–100.0)

## 2018-01-27 MED ORDER — HYDROCHLOROTHIAZIDE 25 MG PO TABS: 25.0000 mg | ORAL_TABLET | Freq: Every day | ORAL | 3 refills | Status: DC

## 2018-01-27 NOTE — Progress Notes (Signed)
   Subjective:    Patient ID: [name] [name], female    DOB: [birth year], 77 y.o.   MRN: [id number]  HPI The patient is a 77 YO female coming in for swelling in her legs and follow up of blood pressure. She was seen at the cancer center and BP was in the high 190s/90s. She is not sure why that was. She was taking her medications and none of them had been missed or was out. She does check it rarely at home and it is typically more in the 120-130s/80. She is having swelling in her left leg for several weeks now and the right leg is swelling some as well. Denies recent travel or immobilization. Is tired and getting iron infusion in the next week or so. Denies salt in her diet and makes most things from scratch. She denies soups, deli meats, chips or sodas. They are worse after being on them. Go down in the morning or with elevation.   Review of Systems  Constitutional: Positive for fatigue.  HENT: Negative.   Eyes: Negative.   Respiratory: Negative for cough, chest tightness and shortness of breath.   Cardiovascular: Positive for leg swelling. Negative for chest pain and palpitations.  Gastrointestinal: Negative for abdominal distention, abdominal pain, constipation, diarrhea, nausea and vomiting.  Musculoskeletal: Negative.   Skin: Negative.   Neurological: Negative.       Objective:   Physical Exam  Constitutional: She is oriented to person, place, and time. She appears well-developed and well-nourished.  HENT:  Head: Normocephalic and atraumatic.  Eyes: EOM are normal.  Neck: Normal range of motion.  Cardiovascular: Normal rate and regular rhythm.  Pulmonary/Chest: Effort normal and breath sounds normal. No respiratory distress. She has no wheezes. She has no rales.  Abdominal: Soft. Bowel sounds are normal. She exhibits no distension. There is no tenderness. There is no rebound.  Musculoskeletal: She exhibits edema.  1-2+ in the left leg, 1+ in the right leg edema, pitting    Neurological: She is alert and oriented to person, place, and time. Coordination normal.  Skin: Skin is warm and dry.  Psychiatric: She has a normal mood and affect.   Vitals:   01/27/18 1420  BP: 128/78  Pulse: 84  Temp: 98 F (36.7 C)  TempSrc: Oral  SpO2: 98%  Weight: 181 lb (82.1 kg)  Height: 5' 5.5" (1.664 m)      Assessment & Plan:

## 2018-01-27 NOTE — Patient Instructions (Addendum)
We will check the labs today and call you back about the results.  We have sent in the refill of the blood pressure medicine.   Chronic Venous Insufficiency Chronic venous insufficiency, also called venous stasis, is a condition that prevents blood from being pumped effectively through the veins in your legs. Blood may no longer be pumped effectively from the legs back to the heart. This condition can range from mild to severe. With proper treatment, you should be able to continue with an active life. What are the causes? Chronic venous insufficiency occurs when the vein walls become stretched, weakened, or damaged, or when valves within the vein are damaged. Some common causes of this include:  High blood pressure inside the veins (venous hypertension).  Increased blood pressure in the leg veins from long periods of sitting or standing.  A blood clot that blocks blood flow in a vein (deep vein thrombosis, DVT).  Inflammation of a vein (phlebitis) that causes a blood clot to form.  Tumors in the pelvis that cause blood to back up.  What increases the risk? The following factors may make you more likely to develop this condition:  Having a family history of this condition.  Obesity.  Pregnancy.  Living without enough physical activity or exercise (sedentary lifestyle).  Smoking.  Having a job that requires long periods of standing or sitting in one place.  Being a certain age. Women in their 23s and 46s and men in their 50s are more likely to develop this condition.  What are the signs or symptoms? Symptoms of this condition include:  Veins that are enlarged, bulging, or twisted (varicose veins).  Skin breakdown or ulcers.  Reddened or discolored skin on the front of the leg.  Brown, smooth, tight, and painful skin just above the ankle, usually on the inside of the leg (lipodermatosclerosis).  Swelling.  How is this diagnosed? This condition may be diagnosed based  on:  Your medical history.  A physical exam.  Tests, such as: ? A procedure that creates an image of a blood vessel and nearby organs and provides information about blood flow through the blood vessel (duplex ultrasound). ? A procedure that tests blood flow (plethysmography). ? A procedure to look at the veins using X-ray and dye (venogram).  How is this treated? The goals of treatment are to help you return to an active life and to minimize pain or disability. Treatment depends on the severity of your condition, and it may include:  Wearing compression stockings. These can help relieve symptoms and help prevent your condition from getting worse. However, they do not cure the condition.  Sclerotherapy. This is a procedure involving an injection of a material that "dissolves" damaged veins.  Surgery. This may involve: ? Removing a diseased vein (vein stripping). ? Cutting off blood flow through the vein (laser ablation surgery). ? Repairing a valve.  Follow these instructions at home:  Wear compression stockings as told by your health care provider. These stockings help to prevent blood clots and reduce swelling in your legs.  Take over-the-counter and prescription medicines only as told by your health care provider.  Stay active by exercising, walking, or doing different activities. Ask your health care provider what activities are safe for you and how much exercise you need.  Drink enough fluid to keep your urine clear or pale yellow.  Do not use any products that contain nicotine or tobacco, such as cigarettes and e-cigarettes. If you need help quitting, ask  your health care provider.  Keep all follow-up visits as told by your health care provider. This is important. Contact a health care provider if:  You have redness, swelling, or more pain in the affected area.  You see a red streak or line that extends up or down from the affected area.  You have skin breakdown or a  loss of skin in the affected area, even if the breakdown is small.  You get an injury in the affected area. Get help right away if:  You get an injury and an open wound in the affected area.  You have severe pain that does not get better with medicine.  You have sudden numbness or weakness in the foot or ankle below the affected area, or you have trouble moving your foot or ankle.  You have a fever and you have worse or persistent symptoms.  You have chest pain.  You have shortness of breath. Summary  Chronic venous insufficiency, also called venous stasis, is a condition that prevents blood from being pumped effectively through the veins in your legs.  Chronic venous insufficiency occurs when the vein walls become stretched, weakened, or damaged, or when valves within the vein are damaged.  Treatment for this condition depends on how severe your condition is, and it may involve wearing compression stockings or having a procedure.  Make sure you stay active by exercising, walking, or doing different activities. Ask your health care provider what activities are safe for you and how much exercise you need. This information is not intended to replace advice given to you by your health care provider. Make sure you discuss any questions you have with your health care provider. Document Released: 11/12/2006 Document Revised: 05/28/2016 Document Reviewed: 05/28/2016 Elsevier Interactive Patient Education  2017 [employer].

## 2018-01-28 DIAGNOSIS — I872 Venous insufficiency (chronic) (peripheral): Secondary | ICD-10-CM | POA: Insufficient documentation

## 2018-01-28 NOTE — Assessment & Plan Note (Signed)
Checking BNP to rule out heart involvement although this sounds to be venous insufficiency. She is taking hctz and refilled that today. Talked to her about elevation and decreasing salt intake (although this is already low).

## 2018-01-31 ENCOUNTER — Inpatient Hospital Stay: Payer: Medicare Other | Attending: Hematology and Oncology

## 2018-01-31 DIAGNOSIS — R7989 Other specified abnormal findings of blood chemistry: Secondary | ICD-10-CM

## 2018-01-31 DIAGNOSIS — D509 Iron deficiency anemia, unspecified: Secondary | ICD-10-CM | POA: Diagnosis not present

## 2018-01-31 DIAGNOSIS — D500 Iron deficiency anemia secondary to blood loss (chronic): Secondary | ICD-10-CM

## 2018-01-31 LAB — CMP (CANCER CENTER ONLY)
ALK PHOS: 140 U/L — AB (ref 38–126)
ALT: 34 U/L (ref 0–44)
AST: 27 U/L (ref 15–41)
Albumin: 3.5 g/dL (ref 3.5–5.0)
Anion gap: 8 (ref 5–15)
BILIRUBIN TOTAL: 0.3 mg/dL (ref 0.3–1.2)
BUN: 15 mg/dL (ref 8–23)
CALCIUM: 9.7 mg/dL (ref 8.9–10.3)
CO2: 26 mmol/L (ref 22–32)
CREATININE: 1.21 mg/dL — AB (ref 0.44–1.00)
Chloride: 99 mmol/L (ref 98–111)
GFR, EST AFRICAN AMERICAN: 49 mL/min — AB (ref >60)
GFR, EST NON AFRICAN AMERICAN: 42 mL/min — AB (ref >60)
Glucose, Bld: 184 mg/dL — ABNORMAL HIGH (ref 70–99)
Potassium: 4.9 mmol/L (ref 3.5–5.1)
Sodium: 133 mmol/L — ABNORMAL LOW (ref 135–145)
Total Protein: 7.5 g/dL (ref 6.5–8.1)

## 2018-01-31 LAB — CBC WITH DIFFERENTIAL (CANCER CENTER ONLY)
BASOS PCT: 1 %
Basophils Absolute: 0.1 10*3/uL (ref 0.0–0.1)
EOS ABS: 0 10*3/uL (ref 0.0–0.5)
EOS PCT: 0 %
HCT: 45.8 % (ref 34.8–46.6)
HEMOGLOBIN: 14.7 g/dL (ref 11.6–15.9)
Lymphocytes Relative: 5 %
Lymphs Abs: 0.7 10*3/uL — ABNORMAL LOW (ref 0.9–3.3)
MCH: 28 pg (ref 25.1–34.0)
MCHC: 32 g/dL (ref 31.5–36.0)
MCV: 87.5 fL (ref 79.5–101.0)
Monocytes Absolute: 0.4 10*3/uL (ref 0.1–0.9)
Monocytes Relative: 3 %
NEUTROS PCT: 91 %
Neutro Abs: 13.4 10*3/uL — ABNORMAL HIGH (ref 1.5–6.5)
PLATELETS: 423 10*3/uL — AB (ref 145–400)
RBC: 5.24 MIL/uL (ref 3.70–5.45)
RDW: 20.5 % — ABNORMAL HIGH (ref 11.2–14.5)
WBC: 14.7 10*3/uL — AB (ref 3.9–10.3)

## 2018-01-31 LAB — IRON AND TIBC
IRON: 45 ug/dL (ref 41–142)
SATURATION RATIOS: 17 % — AB (ref 21–57)
TIBC: 268 ug/dL (ref 236–444)
UIBC: 222 ug/dL

## 2018-01-31 LAB — FERRITIN: Ferritin: 194 ng/mL (ref 11–307)

## 2018-02-13 ENCOUNTER — Other Ambulatory Visit: Payer: Self-pay | Admitting: Nurse Practitioner

## 2018-02-13 DIAGNOSIS — R7989 Other specified abnormal findings of blood chemistry: Secondary | ICD-10-CM

## 2018-03-03 ENCOUNTER — Inpatient Hospital Stay: Payer: Medicare Other | Attending: Hematology and Oncology

## 2018-03-03 DIAGNOSIS — D509 Iron deficiency anemia, unspecified: Secondary | ICD-10-CM | POA: Insufficient documentation

## 2018-03-03 DIAGNOSIS — D500 Iron deficiency anemia secondary to blood loss (chronic): Secondary | ICD-10-CM

## 2018-03-03 DIAGNOSIS — R7989 Other specified abnormal findings of blood chemistry: Secondary | ICD-10-CM

## 2018-03-03 LAB — IRON AND TIBC
Iron: 163 ug/dL — ABNORMAL HIGH (ref 41–142)
SATURATION RATIOS: 55 % (ref 21–57)
TIBC: 296 ug/dL (ref 236–444)
UIBC: 133 ug/dL

## 2018-03-03 LAB — CBC WITH DIFFERENTIAL (CANCER CENTER ONLY)
Basophils Absolute: 0 10*3/uL (ref 0.0–0.1)
Basophils Relative: 0 %
Eosinophils Absolute: 0 10*3/uL (ref 0.0–0.5)
Eosinophils Relative: 0 %
HEMATOCRIT: 45 % (ref 34.8–46.6)
HEMOGLOBIN: 14.3 g/dL (ref 11.6–15.9)
LYMPHS ABS: 1.8 10*3/uL (ref 0.9–3.3)
LYMPHS PCT: 10 %
MCH: 28.9 pg (ref 25.1–34.0)
MCHC: 31.8 g/dL (ref 31.5–36.0)
MCV: 90.9 fL (ref 79.5–101.0)
Monocytes Absolute: 0.7 10*3/uL (ref 0.1–0.9)
Monocytes Relative: 4 %
NEUTROS PCT: 86 %
Neutro Abs: 15.6 10*3/uL — ABNORMAL HIGH (ref 1.5–6.5)
Platelet Count: 583 10*3/uL — ABNORMAL HIGH (ref 145–400)
RBC: 4.95 MIL/uL (ref 3.70–5.45)
RDW: 18.1 % — ABNORMAL HIGH (ref 11.2–14.5)
WBC: 18.1 10*3/uL — AB (ref 3.9–10.3)

## 2018-03-03 LAB — CMP (CANCER CENTER ONLY)
ALT: 31 U/L (ref 0–44)
AST: 26 U/L (ref 15–41)
Albumin: 3.6 g/dL (ref 3.5–5.0)
Alkaline Phosphatase: 129 U/L — ABNORMAL HIGH (ref 38–126)
Anion gap: 12 (ref 5–15)
BUN: 17 mg/dL (ref 8–23)
CHLORIDE: 99 mmol/L (ref 98–111)
CO2: 25 mmol/L (ref 22–32)
Calcium: 9.8 mg/dL (ref 8.9–10.3)
Creatinine: 1.07 mg/dL — ABNORMAL HIGH (ref 0.44–1.00)
GFR, EST AFRICAN AMERICAN: 57 mL/min — AB (ref >60)
GFR, EST NON AFRICAN AMERICAN: 49 mL/min — AB (ref >60)
Glucose, Bld: 122 mg/dL — ABNORMAL HIGH (ref 70–99)
POTASSIUM: 4.6 mmol/L (ref 3.5–5.1)
SODIUM: 136 mmol/L (ref 135–145)
Total Bilirubin: 0.4 mg/dL (ref 0.3–1.2)
Total Protein: 8.2 g/dL — ABNORMAL HIGH (ref 6.5–8.1)

## 2018-03-03 LAB — FERRITIN: FERRITIN: 88 ng/mL (ref 11–307)

## 2018-03-12 LAB — JAK2 (INCLUDING V617F AND EXON 12), MPL,& CALR W/RFL MPN PANEL (NGS)

## 2018-03-21 ENCOUNTER — Telehealth: Payer: Self-pay | Admitting: Nurse Practitioner

## 2018-03-21 NOTE — Telephone Encounter (Signed)
I called patient to discuss MPN panel report. Daughter in law answered mobile phone and no answer at home phone. I did not leave identifying information and informed her I would attempt to reach Ms. Vanhook at another time.

## 2018-04-01 ENCOUNTER — Telehealth: Payer: Self-pay | Admitting: Nurse Practitioner

## 2018-04-01 NOTE — Telephone Encounter (Signed)
2nd call to patient to review JAK2 result. No answer, did not leave message. Patient coming in 04/03/18 for follow up with Dr. Burr Medico; will discuss results with her then.

## 2018-04-01 NOTE — Progress Notes (Signed)
[place]  [phone]) [phone] [phone]) [phone]  Clinic Follow up Note   Patient Care Team: Hoyt Koch, MD as PCP - General (Internal Medicine) [name], MD as Consulting Physician (Gastroenterology) 04/03/2018  DIAGNOSIS: Iron deficiency anemia  HISTORY OF PRESENT ILLNESS:  Patient was initially seen by Dr. Lebron Conners on 11/21/17 for thrombocytosis, leukocytosis and decreased hemoglobin. Available records indicate chronic leukocytosis dating back to 05/2011 and thrombocytopenia in 08/2011 in the setting of active crohn's colitis diagnosed in 2000 s/p multiple treatments and on chronic steroids. She was referred for down trending hemoglobin which from what I can tell has remained in low-normal range around 13 recently. She has been under long term care of Dr. Carlean Purl for Crohn's. Labs checked at Dr. Clydene Laming initial consult on 11/21/17 revealed ferritin level 16, serum iron 26, and 8% saturation, consistent with iron deficiency anemia. She was initiated on oral iron therapy ferrous sulfate 1 tab TID with meals on 12/02/17. Thrombocytosis was felt to be reactive from iron deficiency anemia and inflammation. Leukocytosis was attributed to reaction to prednisone.   She reports her iron studies have never been checked previously.  She has a history of blood transfusion without associated diagnosis in 1966.  She does not recall the reason or surrounding events.  Denies heavy NSAID use.  Denies pica.  She is under care of Dr. Arelia Longest in GI for Crohn's disease, last flex sigmoid and 06/12/2015.  PMH includes HTN, chronic back and leg pain, arthritis, osteopenia, and GERD.  She is retired, lives with her son.  She uses a cane today but is independent of all ADLs.  She drives herself.  She feels safe in the home.  Family history is positive for mother with leukemia, maternal grandfather with leukemia, paternal aunt with ovarian cancer, another paternal aunt with breast  cancer, and another maternal aunt with throat cancer.   CURRENT THERAPY Continue oral iron. IV iron as needed.   INTERVAL HISTORY: [name] returns for follow-up as scheduled. She is here alone at the clinic. She said that she doesn't take her iron pills 3 times daily. She usually takes it twice a day, but sometimes misses her morning dose.  She is taking Prednisone 20 mg and her Chron's disease is stable. She sometimes experiences right ear infections and right sided neck and ear pain.  She said that she tolerated infusion well and didn't experience side effects.   REVIEW OF SYSTEMS:  Constitutional: Denies fevers, chills or abnormal weight loss Eyes: Denies blurriness of vision Ears, nose, mouth, throat, and face: Denies mucositis or sore throat (+) right earache and neck pain Respiratory: Denies cough, dyspnea or wheezes Cardiovascular: Denies palpitation, chest discomfort or lower extremity swelling Gastrointestinal:  Denies nausea, heartburn or change in bowel habits Skin: Denies abnormal skin rashes Lymphatics: Denies new lymphadenopathy or easy bruising Neurological:Denies numbness, tingling or new weaknesses Behavioral/Psych: Mood is stable, no new changes  All other systems were reviewed with the patient and are negative.  MEDICAL HISTORY:  Past Medical History:  Diagnosis Date  . Allergy    year around allergies   . Arthritis    shoulder  . Bell's palsy   . Blood transfusion    1966  . Blood transfusion without reported diagnosis   . Cataract    bilaterally removed  . Colon polyps    adenomatous polyp June 2012  . Complication of anesthesia    slow to wake  . Crohn's colitis ([place])   .  Dry eyes    left eye worst one  . GERD (gastroesophageal reflux disease)   . Headache(784.0)    hx migraines  . Hypertension   . Shortness of breath     SURGICAL HISTORY: Past Surgical History:  Procedure Laterality Date  . ABDOMINAL HYSTERECTOMY    . APPENDECTOMY      . CATARACT EXTRACTION Bilateral    Separate dates  . CHOLECYSTECTOMY    . COLONOSCOPY  multiple  . flex sigmoidoscopy with biospy  01/17/12  . FLEXIBLE SIGMOIDOSCOPY  06/08/2011   severe Crohn's colitis to descending colon at least  . FOOT GANGLION EXCISION     left  . SHOULDER SURGERY     right  . TONSILLECTOMY      I have reviewed the social history and family history with the patient and they are unchanged from previous note.  ALLERGIES:  is allergic to azathioprine; entyvio [vedolizumab]; adhesive [tape]; and sulfa antibiotics.  MEDICATIONS:  Current Outpatient Medications  Medication Sig Dispense Refill  . b complex vitamins capsule Take 1 capsule by mouth daily.      Marland Kitchen BYSTOLIC 10 MG tablet Take 1 tablet (10 mg total) by mouth daily. Annual appt is due must see provider for future refills 90 tablet 0  . cetirizine (ZYRTEC) 10 MG tablet Take 10 mg by mouth daily as needed for allergies.     Marland Kitchen dicyclomine (BENTYL) 20 MG tablet TAKE 1 TABLET (20 MG TOTAL) BY MOUTH 3 (THREE) TIMES DAILY. FOR STOMACH PAIN 90 tablet 0  . diphenoxylate-atropine (LOMOTIL) 2.5-0.025 MG tablet TAKE 1 TO 2 TABLETS BY MOUTH EVERY 4 HOURS AS NEEDED FOR DIARRHEA 90 tablet 0  . famotidine (PEPCID) 20 MG tablet Take 20 mg by mouth 2 (two) times daily.    Marland Kitchen gabapentin (NEURONTIN) 300 MG capsule Take 300 mg by mouth 3 (three) times daily.    . hydrochlorothiazide (HYDRODIURIL) 25 MG tablet Take 1 tablet (25 mg total) by mouth daily. Need office visit for further refills 90 tablet 3  . mesalamine (CANASA) 1000 MG suppository Place 1 suppository (1,000 mg total) rectally at bedtime. 30 suppository 1  . mesalamine (ROWASA) 4 g enema Use 1 enema at bedtime. 30 Bottle 1  . Multiple Vitamins-Minerals (MULTIVITAMINS THER. W/MINERALS) TABS Take 1 tablet by mouth daily as needed.     . ondansetron (ZOFRAN) 4 MG tablet Take 1 tablet (4 mg total) by mouth every 8 (eight) hours as needed for nausea or vomiting. 30 tablet 1   . pantoprazole (PROTONIX) 40 MG tablet Take 1 tablet (40 mg total) by mouth daily. 90 tablet 3  . predniSONE (DELTASONE) 10 MG tablet Take 1 tablet (10 mg total) by mouth daily. 100 tablet 1  . predniSONE (DELTASONE) 10 MG tablet TAKE 4 TABS X 7 DAYS, TAKE 3 TABS X 7 DAYS, THEN 2 TABS X 7, THEN 1 TAB DAILY. 100 tablet 1  . traMADol (ULTRAM) 50 MG tablet TAKE 1 TABLET BY MOUTH EVERY 8 HOURS AS NEEDED 60 tablet 1  . ferrous sulfate 325 (65 FE) MG EC tablet Take 1 tablet (325 mg total) by mouth 3 (three) times daily with meals. 90 tablet 3   No current facility-administered medications for this visit.     PHYSICAL EXAMINATION: ECOG PERFORMANCE STATUS: 1 - Symptomatic but completely ambulatory  Vitals:   04/03/18 1123  BP: (!) 146/80  Pulse: 83  Resp: 18  Temp: 98.4 F (36.9 C)  SpO2: 97%   Filed Weights  04/03/18 1123  Weight: 176 lb 3.2 oz (79.9 kg)    GENERAL:alert, no distress and comfortable (+) uses cane SKIN: skin color, texture, turgor are normal, no rashes or significant lesions EYES: normal, Conjunctiva are pink and non-injected, sclera clear OROPHARYNX:no exudate, no erythema and lips, buccal mucosa, and tongue normal  NECK: supple, thyroid normal size, non-tender, without nodularity LYMPH:  no palpable lymphadenopathy in the cervical, axillary or inguinal LUNGS: clear to auscultation and percussion with normal breathing effort HEART: regular rate & rhythm and no murmurs and no lower extremity edema ABDOMEN:abdomen soft, non-tender and normal bowel sounds Musculoskeletal:no cyanosis of digits and no clubbing  NEURO: alert & oriented x 3 with fluent speech, no focal motor/sensory deficits  LABORATORY DATA:  I have reviewed the data as listed CBC Latest Ref Rng & Units 04/03/2018 03/03/2018 01/31/2018  WBC 3.9 - 10.3 K/uL 16.5(H) 18.1(H) 14.7(H)  Hemoglobin 11.6 - 15.9 g/dL 15.3 14.3 14.7  Hematocrit 34.8 - 46.6 % 46.9(H) 45.0 45.8  Platelets 145 - 400 K/uL 493(H)  583(H) 423(H)     CMP Latest Ref Rng & Units 04/03/2018 03/03/2018 01/31/2018  Glucose 70 - 99 mg/dL 106(H) 122(H) 184(H)  BUN 8 - 23 mg/dL 14 17 15   Creatinine 0.44 - 1.00 mg/dL 1.17(H) 1.07(H) 1.21(H)  Sodium 135 - 145 mmol/L 137 136 133(L)  Potassium 3.5 - 5.1 mmol/L 3.6 4.6 4.9  Chloride 98 - 111 mmol/L 103 99 99  CO2 22 - 32 mmol/L 25 25 26   Calcium 8.9 - 10.3 mg/dL 9.9 9.8 9.7  Total Protein 6.5 - 8.1 g/dL 7.9 8.2(H) 7.5  Total Bilirubin 0.3 - 1.2 mg/dL 0.4 0.4 0.3  Alkaline Phos 38 - 126 U/L 159(H) 129(H) 140(H)  AST 15 - 41 U/L 28 26 27   ALT 0 - 44 U/L 32 31 34   Pathology 03/11/2018 Genetic Testing    RADIOGRAPHIC STUDIES: I have personally reviewed the radiological images as listed and agreed with the findings in the report. No results found.   ASSESSMENT & PLAN:  77 y.o.  WF with Crohn's disease   1. Iron deficiency without anemia -She was found to have downtrending Hgb on serial CBC, iron studies had never been checked per available records.  -Iron studies 11/21/17 revealed ferritin 16, serum iron 26, and 8% saturation, consistent with iron deficiency. She was started on oral iron 1 tab TID, she consistently takes 2 tabs daily.  -Serum iron previously decreased to 19, 5% sat, and ferritin 19. Appears she is not absorbing oral iron well. I recommended IV Feraheme x1, she tolerated well -I think her thrombocytosis and leukocytosis are likely reactive, secondary to iron deficiency and Crohn disease, much less likely MPN such as ET.  -JAK2 and MPN panel mutation testing was negative -Labs reviewed and discussed with patient, CBC showed WBC 16.5K Hg 15.3 Hct 46.9 and platelets 493K.  -continue monitoring   2. Leukocytosis, thrombocytosis, fluctuating  -I think her leukocytosis is likely reactive to crohn's colitis and prednisone therapy; -Her thrombocytosis is likely reactive to iron deficiency and chronic inflammation from Chron disease -Her thrombocytosis does not  improve after IV iron, overall level fluctuating, no worsening -I have checked her Jak 2 and MPN panel gene mutations, which came back negative. -He is overall mild and fluctuating thrombocytosis, this is unlikely MPN, I will hold bone marrow biopsy for now.  3. Crohn's disease -Long term patient of Dr. Carlean Purl. She has failed multiple therapies, has been maintained on prednisone for many  years, usually 10-20 mg daily.  -She was previously encouraged by Dr. Lebron Conners and again by me and Lacie to f/u with Dr. Carlean Purl for further evaluation of recent rectal bleeding and ongoing care for crohn's disease.  -Stable  4. Osteopenia  -last DEXA 2015 T score -1.5 at femoral neck; she was instructed to take calcium and vitamin D.  -She reports to only taking vitamin D, Lacie encouraged her to add calcium and explained there are tablets that contain both components so she doesn't have to take extra tablets. She agrees.   5. HTN -On Bystolic per PCP; blood pressure consistently high. 188/98 on repeat during iron infusion. She will need to consult with PCP for possible med adjustment.  PLAN: -Lab results reviewed  -Continue oral arm  -IV iron if needed  -labs in 3 months -F/u in 6 months with labs a weeks before    No problem-specific Assessment & Plan notes found for this encounter.   No orders of the defined types were placed in this encounter.  All questions were answered. The patient knows to call the clinic with any problems, questions or concerns. No barriers to learning was detected. I spent 20 minutes counseling the patient face to face. The total time spent in the appointment was 25 minutes and more than 50% was on counseling and review of test results  I, Noor Dweik am acting as scribe for Dr. Truitt Merle.  I have reviewed the above documentation for accuracy and completeness, and I agree with the above.      Truitt Merle, MD 04/03/2018

## 2018-04-03 ENCOUNTER — Telehealth: Payer: Self-pay | Admitting: Hematology

## 2018-04-03 ENCOUNTER — Inpatient Hospital Stay (HOSPITAL_BASED_OUTPATIENT_CLINIC_OR_DEPARTMENT_OTHER): Payer: Medicare Other | Admitting: Hematology

## 2018-04-03 ENCOUNTER — Other Ambulatory Visit: Payer: Self-pay | Admitting: Internal Medicine

## 2018-04-03 ENCOUNTER — Inpatient Hospital Stay: Payer: Medicare Other | Attending: Hematology and Oncology

## 2018-04-03 ENCOUNTER — Encounter: Payer: Self-pay | Admitting: Hematology

## 2018-04-03 VITALS — BP 146/80 | HR 83 | Temp 98.4°F | Resp 18 | Ht 65.5 in | Wt 176.2 lb

## 2018-04-03 DIAGNOSIS — I10 Essential (primary) hypertension: Secondary | ICD-10-CM

## 2018-04-03 DIAGNOSIS — Z801 Family history of malignant neoplasm of trachea, bronchus and lung: Secondary | ICD-10-CM | POA: Diagnosis not present

## 2018-04-03 DIAGNOSIS — Z803 Family history of malignant neoplasm of breast: Secondary | ICD-10-CM

## 2018-04-03 DIAGNOSIS — R7989 Other specified abnormal findings of blood chemistry: Secondary | ICD-10-CM

## 2018-04-03 DIAGNOSIS — M1990 Unspecified osteoarthritis, unspecified site: Secondary | ICD-10-CM

## 2018-04-03 DIAGNOSIS — D500 Iron deficiency anemia secondary to blood loss (chronic): Secondary | ICD-10-CM

## 2018-04-03 DIAGNOSIS — M8580 Other specified disorders of bone density and structure, unspecified site: Secondary | ICD-10-CM | POA: Diagnosis not present

## 2018-04-03 DIAGNOSIS — D72829 Elevated white blood cell count, unspecified: Secondary | ICD-10-CM | POA: Insufficient documentation

## 2018-04-03 DIAGNOSIS — Z86010 Personal history of colonic polyps: Secondary | ICD-10-CM

## 2018-04-03 DIAGNOSIS — K5090 Crohn's disease, unspecified, without complications: Secondary | ICD-10-CM | POA: Insufficient documentation

## 2018-04-03 DIAGNOSIS — K219 Gastro-esophageal reflux disease without esophagitis: Secondary | ICD-10-CM | POA: Diagnosis not present

## 2018-04-03 DIAGNOSIS — Z806 Family history of leukemia: Secondary | ICD-10-CM | POA: Insufficient documentation

## 2018-04-03 DIAGNOSIS — G8929 Other chronic pain: Secondary | ICD-10-CM

## 2018-04-03 DIAGNOSIS — D509 Iron deficiency anemia, unspecified: Secondary | ICD-10-CM | POA: Insufficient documentation

## 2018-04-03 DIAGNOSIS — Z8041 Family history of malignant neoplasm of ovary: Secondary | ICD-10-CM | POA: Insufficient documentation

## 2018-04-03 LAB — FERRITIN: Ferritin: 64 ng/mL (ref 11–307)

## 2018-04-03 LAB — CBC WITH DIFFERENTIAL (CANCER CENTER ONLY)
Basophils Absolute: 0.1 10*3/uL (ref 0.0–0.1)
Basophils Relative: 1 %
EOS PCT: 0 %
Eosinophils Absolute: 0 10*3/uL (ref 0.0–0.5)
HCT: 46.9 % — ABNORMAL HIGH (ref 34.8–46.6)
Hemoglobin: 15.3 g/dL (ref 11.6–15.9)
LYMPHS PCT: 7 %
Lymphs Abs: 1.1 10*3/uL (ref 0.9–3.3)
MCH: 29.9 pg (ref 25.1–34.0)
MCHC: 32.7 g/dL (ref 31.5–36.0)
MCV: 91.5 fL (ref 79.5–101.0)
Monocytes Absolute: 0.6 10*3/uL (ref 0.1–0.9)
Monocytes Relative: 3 %
NEUTROS ABS: 14.7 10*3/uL — AB (ref 1.5–6.5)
Neutrophils Relative %: 89 %
PLATELETS: 493 10*3/uL — AB (ref 145–400)
RBC: 5.13 MIL/uL (ref 3.70–5.45)
RDW: 17 % — ABNORMAL HIGH (ref 11.2–14.5)
WBC: 16.5 10*3/uL — AB (ref 3.9–10.3)

## 2018-04-03 LAB — CMP (CANCER CENTER ONLY)
ALT: 32 U/L (ref 0–44)
ANION GAP: 9 (ref 5–15)
AST: 28 U/L (ref 15–41)
Albumin: 3.4 g/dL — ABNORMAL LOW (ref 3.5–5.0)
Alkaline Phosphatase: 159 U/L — ABNORMAL HIGH (ref 38–126)
BILIRUBIN TOTAL: 0.4 mg/dL (ref 0.3–1.2)
BUN: 14 mg/dL (ref 8–23)
CO2: 25 mmol/L (ref 22–32)
Calcium: 9.9 mg/dL (ref 8.9–10.3)
Chloride: 103 mmol/L (ref 98–111)
Creatinine: 1.17 mg/dL — ABNORMAL HIGH (ref 0.44–1.00)
GFR, EST NON AFRICAN AMERICAN: 44 mL/min — AB (ref >60)
GFR, Est AFR Am: 51 mL/min — ABNORMAL LOW (ref >60)
Glucose, Bld: 106 mg/dL — ABNORMAL HIGH (ref 70–99)
POTASSIUM: 3.6 mmol/L (ref 3.5–5.1)
Sodium: 137 mmol/L (ref 135–145)
Total Protein: 7.9 g/dL (ref 6.5–8.1)

## 2018-04-03 LAB — IRON AND TIBC
IRON: 62 ug/dL (ref 41–142)
SATURATION RATIOS: 21 % (ref 21–57)
TIBC: 291 ug/dL (ref 236–444)
UIBC: 229 ug/dL

## 2018-04-03 NOTE — Telephone Encounter (Signed)
Scheduled appt per 9/12 los - gave patient AVS and calender per los.

## 2018-04-15 ENCOUNTER — Encounter: Payer: Self-pay | Admitting: Internal Medicine

## 2018-04-15 ENCOUNTER — Ambulatory Visit (INDEPENDENT_AMBULATORY_CARE_PROVIDER_SITE_OTHER): Payer: Medicare Other | Admitting: Internal Medicine

## 2018-04-15 DIAGNOSIS — D899 Disorder involving the immune mechanism, unspecified: Secondary | ICD-10-CM

## 2018-04-15 DIAGNOSIS — I10 Essential (primary) hypertension: Secondary | ICD-10-CM

## 2018-04-15 DIAGNOSIS — K50111 Crohn's disease of large intestine with rectal bleeding: Secondary | ICD-10-CM

## 2018-04-15 DIAGNOSIS — D500 Iron deficiency anemia secondary to blood loss (chronic): Secondary | ICD-10-CM

## 2018-04-15 DIAGNOSIS — D849 Immunodeficiency, unspecified: Secondary | ICD-10-CM

## 2018-04-15 NOTE — Assessment & Plan Note (Signed)
BP up but did not take meds today F/U PCP

## 2018-04-15 NOTE — Patient Instructions (Addendum)
  You have been scheduled for a colonoscopy. Please follow written instructions given to you at your visit today.  Please pick up your prep supplies at the pharmacy within the next 1-3 days. If you use inhalers (even only as needed), please bring them with you on the day of your procedure.   Please cut your prednisone to 20m daily.   Make sure and get your flu shot for the 2019-2020 season.   I appreciate the opportunity to care for you. CSilvano Rusk MD, [facility]

## 2018-04-15 NOTE — Assessment & Plan Note (Signed)
Not in stable remission Higher doses of prednisone - tryto get back to 20 mg ? Alternative biologic BUT this has never worked well side effects/cost Evaluate with colonoscopy The risks and benefits as well as alternatives of endoscopic procedure(s) have been discussed and reviewed. All questions answered. The patient agrees to proceed.

## 2018-04-15 NOTE — Assessment & Plan Note (Signed)
Colonoscopy Hematology/parenteral iron

## 2018-04-15 NOTE — Assessment & Plan Note (Signed)
Get influenza vaccine
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
## 2018-04-23 ENCOUNTER — Other Ambulatory Visit: Payer: Self-pay | Admitting: Internal Medicine

## 2018-05-04 ENCOUNTER — Other Ambulatory Visit: Payer: Self-pay | Admitting: Gastroenterology

## 2018-05-05 ENCOUNTER — Telehealth: Payer: Self-pay | Admitting: *Deleted

## 2018-05-05 NOTE — Telephone Encounter (Signed)
Received request for Prednisone taper that was prescribed when the patient saw Alonza Bogus PA, 11-04-2017.  The patient saw Dr. [name] on 04-15-2018, and he recommended the patient stay on Prenidsone 20 mg daily.  I refused the Prednisone taper.

## 2018-05-12 ENCOUNTER — Other Ambulatory Visit: Payer: Self-pay | Admitting: Gastroenterology

## 2018-05-12 ENCOUNTER — Other Ambulatory Visit: Payer: Self-pay

## 2018-05-12 MED ORDER — PREDNISONE 10 MG PO TABS: 20.0000 mg | ORAL_TABLET | Freq: Every day | ORAL | 0 refills | Status: DC

## 2018-06-06 DIAGNOSIS — Z23 Encounter for immunization: Secondary | ICD-10-CM | POA: Diagnosis not present

## 2018-06-09 ENCOUNTER — Ambulatory Visit (AMBULATORY_SURGERY_CENTER): Payer: Medicare Other | Admitting: Internal Medicine

## 2018-06-09 ENCOUNTER — Encounter: Payer: Self-pay | Admitting: Internal Medicine

## 2018-06-09 VITALS — BP 154/75 | HR 62 | Temp 98.7°F | Resp 21

## 2018-06-09 DIAGNOSIS — D125 Benign neoplasm of sigmoid colon: Secondary | ICD-10-CM

## 2018-06-09 DIAGNOSIS — I10 Essential (primary) hypertension: Secondary | ICD-10-CM | POA: Diagnosis not present

## 2018-06-09 DIAGNOSIS — K51511 Left sided colitis with rectal bleeding: Secondary | ICD-10-CM | POA: Diagnosis not present

## 2018-06-09 DIAGNOSIS — K635 Polyp of colon: Secondary | ICD-10-CM

## 2018-06-09 DIAGNOSIS — D124 Benign neoplasm of descending colon: Secondary | ICD-10-CM

## 2018-06-09 DIAGNOSIS — K529 Noninfective gastroenteritis and colitis, unspecified: Secondary | ICD-10-CM | POA: Diagnosis not present

## 2018-06-09 DIAGNOSIS — K50111 Crohn's disease of large intestine with rectal bleeding: Secondary | ICD-10-CM

## 2018-06-09 DIAGNOSIS — K5090 Crohn's disease, unspecified, without complications: Secondary | ICD-10-CM | POA: Diagnosis not present

## 2018-06-09 DIAGNOSIS — D122 Benign neoplasm of ascending colon: Secondary | ICD-10-CM

## 2018-06-09 DIAGNOSIS — K51411 Inflammatory polyps of colon with rectal bleeding: Secondary | ICD-10-CM | POA: Diagnosis not present

## 2018-06-09 MED ORDER — PREDNISONE 20 MG PO TABS: ORAL_TABLET | ORAL | 1 refills | Status: DC

## 2018-06-09 MED ORDER — SODIUM CHLORIDE 0.9 % IV SOLN: 500.0000 mL | Freq: Once | INTRAVENOUS | Status: DC

## 2018-06-09 NOTE — Progress Notes (Signed)
Called to room to assist during endoscopic procedure.  Patient ID and intended procedure confirmed with present staff. Received instructions for my participation in the procedure from the performing physician.  

## 2018-06-09 NOTE — Progress Notes (Signed)
Report given to PACU, vss 

## 2018-06-09 NOTE — Op Note (Signed)
[facility] Patient Name: [name] [name] Procedure Date: 06/09/2018 11:47 AM MRN: [id number] Endoscopist: [name] , MD Age: 76 Referring MD:  Date of Birth: 1940/07/24 Gender: Female Account #: [id number] Procedure:                Colonoscopy Indications:              Crohn's disease of the colon, Follow-up of Crohn's                            disease of the colon, Disease activity assessment                            of Crohn's disease of the colon, Assess therapeutic                            response to therapy of Crohn's disease of the colon Medicines:                Propofol per Anesthesia, Monitored Anesthesia Care Procedure:                Pre-Anesthesia Assessment:                           - Prior to the procedure, a History and Physical                            was performed, and patient medications and                            allergies were reviewed. The patient's tolerance of                            previous anesthesia was also reviewed. The risks                            and benefits of the procedure and the sedation                            options and risks were discussed with the patient.                            All questions were answered, and informed consent                            was obtained. Prior Anticoagulants: The patient has                            taken no previous anticoagulant or antiplatelet                            agents. ASA Grade Assessment: II - A patient with                            mild systemic disease. After reviewing the risks  and benefits, the patient was deemed in                            satisfactory condition to undergo the procedure.                           After obtaining informed consent, the colonoscope                            was passed under direct vision. Throughout the                            procedure, the patient's blood pressure, pulse, and                     oxygen saturations were monitored continuously. The                            Colonoscope was introduced through the anus and                            advanced to the the cecum, identified by                            appendiceal orifice and ileocecal valve. The                            colonoscopy was performed without difficulty. The                            patient tolerated the procedure well. The quality                            of the bowel preparation was adequate. The                            ileocecal valve, appendiceal orifice, and rectum                            were photographed. Scope In: 11:53:09 AM Scope Out: 12:14:55 PM Scope Withdrawal Time: 0 hours 18 minutes 24 seconds  Total Procedure Duration: 0 hours 21 minutes 46 seconds  Findings:                 The perianal and digital rectal examinations were                            normal.                           An area of moderately congested, erythematous,                            inflamed, ulcerated and vascular-pattern-decreased  mucosa was found in the rectum, in the sigmoid                            colon, in the descending colon and in the cecum.                            Biopsies were taken with a cold forceps for                            histology. Verification of patient identification                            for the specimen was done. Estimated blood loss was                            minimal.                           Three sessile and semi-pedunculated polyps were                            found in the descending colon. The polyps were                            diminutive in size. These polyps were removed with                            a cold snare. Resection and retrieval were                            complete. Verification of patient identification                            for the specimen was done. Estimated blood loss was                             minimal.                           Diffuse pseudopolyps were found in the sigmoid                            colon. Biopsies were taken with a cold forceps for                            histology. Verification of patient identification                            for the specimen was done. Estimated blood loss was                            minimal.  Multiple diverticula were found in the sigmoid                            colon.                           The exam was otherwise without abnormality on                            direct and retroflexion views.                           Biopsies for histology were taken with a cold                            forceps from the transverse colon for evaluation of                            microscopic colitis.                           A 2 mm polyp was found in the ascending colon. The                            polyp was sessile. The polyp was removed with a                            cold biopsy forceps. Resection and retrieval were                            complete. Verification of patient identification                            for the specimen was done. Estimated blood loss was                            minimal. Complications:            No immediate complications. Estimated Blood Loss:     Estimated blood loss was minimal. Impression:               - Congested, erythematous, inflamed, ulcerated and                            vascular-pattern-decreased mucosa in the rectum, in                            the sigmoid colon, in the descending colon and in                            the cecum. Biopsied. Active IBD, Crohn's is working                            dx - entire left colon moderately inflamed and a  patch of cecum. Could not enter terminal ileum.                           - Three diminutive polyps in the descending colon,                            removed with  a cold snare. Resected and retrieved.                           - Pseudopolyps in the sigmoid colon. Biopsied.                           - Diverticulosis in the sigmoid colon.                           - The examination was otherwise normal on direct                            and retroflexion views.                           - One 2 mm polyp in the ascending colon, removed                            with a cold biopsy forceps. Resected and retrieved.                           - Biopsies were taken with a cold forceps from the                            transverse colon for evaluation of microscopic                            colitis. Recommendation:           - Patient has a contact number available for                            emergencies. The signs and symptoms of potential                            delayed complications were discussed with the                            patient. Return to normal activities tomorrow.                            Written discharge instructions were provided to the                            patient.                           - Resume previous diet.                           -  Continue present medications.                           - Await pathology results.                           - Increase prednisone to 40 mg daily                           Schedule office visit follow-up for 1 month will do                            that from [facility] today [name], MD 06/09/2018 12:26:19 PM This report has been signed electronically.

## 2018-06-09 NOTE — Patient Instructions (Addendum)
No surprise but it is inflamed in the colon.  I took biopsies and will let you know.  I think raising prednisone to 40 mg daily makes sense now so do that.  We will schedule an appointment for 1 month from now also.  I appreciate the opportunity to care for you. [name], MD, [facility]    Await pathology results from Dr Carlean Purl  Information on polyps given to you today    YOU HAD AN ENDOSCOPIC PROCEDURE TODAY AT [facility]:   Refer to the procedure report that was given to you for any specific questions about what was found during the examination.  If the procedure report does not answer your questions, please call your gastroenterologist to clarify.  If you requested that your care partner not be given the details of your procedure findings, then the procedure report has been included in a sealed envelope for you to review at your convenience later.  YOU SHOULD EXPECT: Some feelings of bloating in the abdomen. Passage of more gas than usual.  Walking can help get rid of the air that was put into your GI tract during the procedure and reduce the bloating. If you had a lower endoscopy (such as a colonoscopy or flexible sigmoidoscopy) you may notice spotting of blood in your stool or on the toilet paper. If you underwent a bowel prep for your procedure, you may not have a normal bowel movement for a few days.  Please Note:  You might notice some irritation and congestion in your nose or some drainage.  This is from the oxygen used during your procedure.  There is no need for concern and it should clear up in a day or so.  SYMPTOMS TO REPORT IMMEDIATELY:   Following lower endoscopy (colonoscopy or flexible sigmoidoscopy):  Excessive amounts of blood in the stool  Significant tenderness or worsening of abdominal pains  Swelling of the abdomen that is new, acute  Fever of 100F or higher   For urgent or emergent issues, a gastroenterologist can be reached at  any hour by calling [phone].   DIET:  We do recommend a small meal at first, but then you may proceed to your regular diet.  Drink plenty of fluids but you should avoid alcoholic beverages for 24 hours.  ACTIVITY:  You should plan to take it easy for the rest of today and you should NOT DRIVE or use heavy machinery until tomorrow (because of the sedation medicines used during the test).    FOLLOW UP: Our staff will call the number listed on your records the next business day following your procedure to check on you and address any questions or concerns that you may have regarding the information given to you following your procedure. If we do not reach you, we will leave a message.  However, if you are feeling well and you are not experiencing any problems, there is no need to return our call.  We will assume that you have returned to your regular daily activities without incident.  If any biopsies were taken you will be contacted by phone or by letter within the next 1-3 weeks.  Please call us at [phone] if you have not heard about the biopsies in 3 weeks.    SIGNATURES/CONFIDENTIALITY: You and/or your care partner have signed paperwork which will be entered into your electronic medical record.  These signatures attest to the fact that that the information above on your After  Visit Summary has been reviewed and is understood.  Full responsibility of the confidentiality of this discharge information lies with you and/or your care-partner.

## 2018-06-09 NOTE — Progress Notes (Signed)
Pt's states no medical or surgical changes since previsit or office visit. 

## 2018-06-10 ENCOUNTER — Telehealth: Payer: Self-pay | Admitting: *Deleted

## 2018-06-10 NOTE — Telephone Encounter (Signed)
  Follow up Call-  Call back number 06/09/2018  Post procedure Call Back phone  # [phone]  Permission to leave phone message Yes  Some recent data might be hidden     Patient questions:  Do you have a fever, pain , or abdominal swelling? No. Pain Score  0 *  Have you tolerated food without any problems? Yes.    Have you been able to return to your normal activities? Yes.    Do you have any questions about your discharge instructions: Diet   No. Medications  No. Follow up visit  No.  Do you have questions or concerns about your Care? No.  Actions: * If pain score is 4 or above: No action needed, pain <4.

## 2018-06-24 ENCOUNTER — Telehealth: Payer: Self-pay | Admitting: Internal Medicine

## 2018-06-24 MED ORDER — MESALAMINE 1.2 G PO TBEC: 2.4000 g | DELAYED_RELEASE_TABLET | Freq: Two times a day (BID) | ORAL | 2 refills | Status: DC

## 2018-06-24 NOTE — Telephone Encounter (Signed)
I reviewed colonoscopy results with the patient.  I think the a sending colon polyp was a sporadic adenoma.  The possibility of a dysplastic lesion and colitis does exist though she had normal mucosa in that area and I doubt it.   She had quite a bit of inflammation with the pseudopolyps and they were indeterminate for dysplasia, in the left colon where she has active Crohn's.   She is on 40 mg prednisone since the colonoscopy on 1118 having been on 20 before that.  She believes her symptoms are under control at this time.   Plans are as follows:  #1 reduce prednisone to 30 mg daily  #2 I have prescribed generic Lialda to take 2.4 g twice daily.  #3 if the prescription is too expensive she is to contact us and we will see if I can do something different  #4 asked her to call us in follow-up in 2 weeks with an update on her signs and symptoms and I will consider tapering the prednisone further  #5 January follow-up appointment needs to be scheduled and the patient needs to be notified

## 2018-06-24 NOTE — Progress Notes (Signed)
See phone note from today, and short I have tapered her prednisone to 30 and started her on generic Lialda.  Does not need a letter from the [facility] but needs a six-month colonoscopy recall

## 2018-06-25 NOTE — Telephone Encounter (Signed)
Patient notified of the recommendations Follow up arranged for 08/06/18

## 2018-06-27 ENCOUNTER — Other Ambulatory Visit: Payer: Self-pay

## 2018-06-27 ENCOUNTER — Other Ambulatory Visit: Payer: Self-pay | Admitting: Gastroenterology

## 2018-06-27 MED ORDER — PREDNISONE 10 MG PO TABS: 30.0000 mg | ORAL_TABLET | Freq: Every day | ORAL | 0 refills | Status: DC

## 2018-07-03 ENCOUNTER — Inpatient Hospital Stay: Payer: Medicare Other | Attending: Hematology and Oncology

## 2018-07-03 DIAGNOSIS — D509 Iron deficiency anemia, unspecified: Secondary | ICD-10-CM | POA: Diagnosis not present

## 2018-07-03 DIAGNOSIS — D500 Iron deficiency anemia secondary to blood loss (chronic): Secondary | ICD-10-CM

## 2018-07-03 DIAGNOSIS — R7989 Other specified abnormal findings of blood chemistry: Secondary | ICD-10-CM

## 2018-07-03 LAB — CMP (CANCER CENTER ONLY)
ALBUMIN: 3.7 g/dL (ref 3.5–5.0)
ALT: 27 U/L (ref 0–44)
ANION GAP: 11 (ref 5–15)
AST: 22 U/L (ref 15–41)
Alkaline Phosphatase: 158 U/L — ABNORMAL HIGH (ref 38–126)
BUN: 14 mg/dL (ref 8–23)
CO2: 29 mmol/L (ref 22–32)
CREATININE: 1.01 mg/dL — AB (ref 0.44–1.00)
Calcium: 10.1 mg/dL (ref 8.9–10.3)
Chloride: 101 mmol/L (ref 98–111)
GFR, Estimated: 54 mL/min — ABNORMAL LOW (ref >60)
Glucose, Bld: 99 mg/dL (ref 70–99)
POTASSIUM: 4.3 mmol/L (ref 3.5–5.1)
Sodium: 141 mmol/L (ref 135–145)
TOTAL PROTEIN: 8.1 g/dL (ref 6.5–8.1)
Total Bilirubin: 0.7 mg/dL (ref 0.3–1.2)

## 2018-07-03 LAB — IRON AND TIBC
IRON: 178 ug/dL — AB (ref 41–142)
Saturation Ratios: 53 % (ref 21–57)
TIBC: 338 ug/dL (ref 236–444)
UIBC: 159 ug/dL (ref 120–384)

## 2018-07-03 LAB — CBC WITH DIFFERENTIAL (CANCER CENTER ONLY)
Abs Immature Granulocytes: 0.12 10*3/uL — ABNORMAL HIGH (ref 0.00–0.07)
BASOS ABS: 0 10*3/uL (ref 0.0–0.1)
Basophils Relative: 0 %
EOS PCT: 0 %
Eosinophils Absolute: 0 10*3/uL (ref 0.0–0.5)
HEMATOCRIT: 48.3 % — AB (ref 36.0–46.0)
HEMOGLOBIN: 15.6 g/dL — AB (ref 12.0–15.0)
IMMATURE GRANULOCYTES: 1 %
LYMPHS ABS: 2.3 10*3/uL (ref 0.7–4.0)
Lymphocytes Relative: 13 %
MCH: 30.6 pg (ref 26.0–34.0)
MCHC: 32.3 g/dL (ref 30.0–36.0)
MCV: 94.7 fL (ref 80.0–100.0)
Monocytes Absolute: 1.2 10*3/uL — ABNORMAL HIGH (ref 0.1–1.0)
Monocytes Relative: 7 %
NEUTROS ABS: 13.7 10*3/uL — AB (ref 1.7–7.7)
NRBC: 0 % (ref 0.0–0.2)
Neutrophils Relative %: 79 %
Platelet Count: 453 10*3/uL — ABNORMAL HIGH (ref 150–400)
RBC: 5.1 MIL/uL (ref 3.87–5.11)
RDW: 13.5 % (ref 11.5–15.5)
WBC Count: 17.4 10*3/uL — ABNORMAL HIGH (ref 4.0–10.5)

## 2018-07-03 LAB — FERRITIN: Ferritin: 70 ng/mL (ref 11–307)

## 2018-07-10 ENCOUNTER — Ambulatory Visit: Payer: Medicare Other | Admitting: Internal Medicine

## 2018-07-11 ENCOUNTER — Ambulatory Visit: Payer: Medicare Other | Admitting: Internal Medicine

## 2018-07-11 DIAGNOSIS — Z0289 Encounter for other administrative examinations: Secondary | ICD-10-CM

## 2018-08-06 ENCOUNTER — Ambulatory Visit (INDEPENDENT_AMBULATORY_CARE_PROVIDER_SITE_OTHER): Payer: Medicare Other | Admitting: Internal Medicine

## 2018-08-06 ENCOUNTER — Encounter: Payer: Self-pay | Admitting: Internal Medicine

## 2018-08-06 DIAGNOSIS — K50111 Crohn's disease of large intestine with rectal bleeding: Secondary | ICD-10-CM | POA: Diagnosis not present

## 2018-08-06 DIAGNOSIS — D500 Iron deficiency anemia secondary to blood loss (chronic): Secondary | ICD-10-CM | POA: Diagnosis not present

## 2018-08-06 MED ORDER — PREDNISONE 5 MG PO TABS: 25.0000 mg | ORAL_TABLET | Freq: Every day | ORAL | 1 refills | Status: DC

## 2018-08-06 NOTE — Progress Notes (Signed)
[name] [name] 78 y.o. 1941-06-23 [id number]  Assessment & Plan:  Crohn's disease of colon with rectal bleeding (HCC) Reduce prednisone to 25 mg x 1 month then 20 mg daily See me in May routine, sooner prn  Iron deficiency anemia due to chronic blood loss Continue iron supplementation      Subjective:   Chief Complaint: Follow-up of Crohn's colitis  HPI The patient presents for follow-up, I last saw her a colonoscopy on June 09, 2018.  She had some mild to moderate left-sided colitis, a patch in the cecum, a 2 mm ascending adenoma that I think was sporadic, some pseudopolyps.  I increased her to 40 mg of prednisone at that time and she has since backed down to 30 and says she is doing very well overall with occasional rectal bleeding but no significant diarrhea or abdominal cramping lately.  I prescribed mesalamine, generic Lialda, but it would have been $500 a month so she did not purchase it.  She continues on her iron therapy and had good numbers when she was last seen in hematology clinic this past December.  See the EMR for details. Allergies  Allergen Reactions  . Azathioprine Nausea Only    Elevated lipase also - ? Pancreatitis   . Entyvio [Vedolizumab] Shortness Of Breath  . Adhesive [Tape] Rash    Taking skin off  . Sulfa Antibiotics Itching and Rash   Current Meds  Medication Sig  . b complex vitamins capsule Take 1 capsule by mouth daily.    Marland Kitchen BYSTOLIC 10 MG tablet Take 1 tablet (10 mg total) by mouth daily. Annual appt is due must see provider for future refills  . cetirizine (ZYRTEC) 10 MG tablet Take 10 mg by mouth daily as needed for allergies.   Marland Kitchen dicyclomine (BENTYL) 20 MG tablet TAKE 1 TABLET (20 MG TOTAL) BY MOUTH 3 (THREE) TIMES DAILY. FOR STOMACH PAIN (Patient taking differently: Take 20 mg by mouth as needed. )  . diphenoxylate-atropine (LOMOTIL) 2.5-0.025 MG tablet TAKE 1 TO 2 TABLETS BY MOUTH EVERY 4 HOURS AS NEEDED FOR DIARRHEA (Patient taking  differently: as needed. )  . famotidine (PEPCID) 20 MG tablet Take 20 mg by mouth 2 (two) times daily as needed.   . gabapentin (NEURONTIN) 300 MG capsule Take 300 mg by mouth 3 (three) times daily.  . hydrochlorothiazide (HYDRODIURIL) 25 MG tablet Take 1 tablet (25 mg total) by mouth daily. Need office visit for further refills  . mesalamine (LIALDA) 1.2 g EC tablet Take 2 tablets (2.4 g total) by mouth 2 (two) times daily.  . Multiple Vitamins-Minerals (MULTIVITAMINS THER. W/MINERALS) TABS Take 1 tablet by mouth daily as needed.   . ondansetron (ZOFRAN) 4 MG tablet Take 1 tablet (4 mg total) by mouth every 8 (eight) hours as needed for nausea or vomiting.  . pantoprazole (PROTONIX) 40 MG tablet Take 1 tablet (40 mg total) by mouth daily. (Patient taking differently: Take 40 mg by mouth as needed. )  . predniSONE (DELTASONE) 10 MG tablet Take 3 tablets (30 mg total) by mouth daily with breakfast.  . traMADol (ULTRAM) 50 MG tablet TAKE 1 TABLET BY MOUTH EVERY 8 HOURS AS NEEDED   Past Medical History:  Diagnosis Date  . Allergy    year around allergies   . Arthritis    shoulder  . Bell's palsy   . Blood transfusion    1966  . Blood transfusion without reported diagnosis   . Cataract    bilaterally  removed  . Colon polyps    adenomatous polyp June 2012  . Complication of anesthesia    slow to wake  . Crohn's colitis ([place])   . Dry eyes    left eye worst one  . GERD (gastroesophageal reflux disease)   . Headache(784.0)    hx migraines  . Hypertension   . Shortness of breath    Past Surgical History:  Procedure Laterality Date  . ABDOMINAL HYSTERECTOMY    . APPENDECTOMY    . CATARACT EXTRACTION Bilateral    Separate dates  . CHOLECYSTECTOMY    . COLONOSCOPY  multiple  . flex sigmoidoscopy with biospy  01/17/12  . FLEXIBLE SIGMOIDOSCOPY  06/08/2011   severe Crohn's colitis to descending colon at least  . FOOT GANGLION EXCISION     left  . SHOULDER SURGERY     right  .  TONSILLECTOMY     Social History   Social History Narrative   The patient is retired and widowed.  She has 3 children and grandchildren.   Second home in [place] she does not go there much anymore.   She is a former smoker but does not use alcohol or tobacco or drugs.   family history includes Breast cancer in her paternal aunt; Heart attack in her father; Heart disease in her brother, father, and mother; Leukemia in her maternal grandfather and mother; Ovarian cancer in her paternal aunt; Parkinsonism in her brother; Throat cancer in her paternal aunt.   Review of Systems As above  Objective:   Physical Exam @BP  124/90   Pulse 82   Ht 5' 5"  (1.651 m)   Wt 171 lb 8 oz (77.8 kg)   BMI 28.54 kg/m @  General:  NAD, cushingoid  Eyes:   anicteric Lungs:  clear Heart::  S1S2 no rubs, murmurs or gallops Abdomen:  soft and nontender, BS+ Ext:   no edema, cyanosis or clubbing - moves slowly to exam table with support    Data Reviewed:    See HPI

## 2018-08-06 NOTE — Patient Instructions (Signed)
We have sent the following medications to your pharmacy for you to pick up at your convenience: Prednisone  Today Dr Carlean Purl is adjusting your prednisone to 25 mg daily for a month and then go to 3m daily and stay there until an office visit with Dr GCarlean Purlin April and May.    I appreciate the opportunity to care for you. CSilvano Rusk MD, [facility]

## 2018-08-06 NOTE — Assessment & Plan Note (Signed)
Continue iron supplementation

## 2018-08-06 NOTE — Assessment & Plan Note (Signed)
Reduce prednisone to 25 mg x 1 month then 20 mg daily See me in May routine, sooner prn

## 2018-08-10 ENCOUNTER — Other Ambulatory Visit: Payer: Self-pay | Admitting: Internal Medicine

## 2018-09-25 ENCOUNTER — Inpatient Hospital Stay: Payer: Medicare Other | Attending: Hematology and Oncology

## 2018-10-02 ENCOUNTER — Inpatient Hospital Stay: Payer: Medicare Other | Admitting: Hematology

## 2018-11-10 ENCOUNTER — Telehealth: Payer: Self-pay | Admitting: Internal Medicine

## 2018-11-10 NOTE — Telephone Encounter (Signed)
Pt thinks that she is having a flare up , she states that she has been passing blood since 2 weeks ago but it has gotten worse and now her stomach is bothering her. Pls call her.

## 2018-11-10 NOTE — Telephone Encounter (Signed)
Patient reports that she is having diarrhea for the last two weeks.  She has had 5 episodes today.  She is on prednisone taking 30 mg daily.  She started this a bout 2 days ago.  Previously she was off prednisone. She is also reports that she is passing blood with each BM.  "I feel lousy".  No sick contacts./  Has not been out of the house in 4 weeks.

## 2018-11-11 ENCOUNTER — Other Ambulatory Visit: Payer: Self-pay

## 2018-11-11 DIAGNOSIS — K50111 Crohn's disease of large intestine with rectal bleeding: Secondary | ICD-10-CM

## 2018-11-11 MED ORDER — PREDNISONE 5 MG PO TABS: 30.0000 mg | ORAL_TABLET | Freq: Every day | ORAL | 1 refills | Status: DC

## 2018-11-11 NOTE — Telephone Encounter (Signed)
Patient notified she will come for labs when she can Follow up arranged for 11/19/18 9:30

## 2018-11-11 NOTE — Telephone Encounter (Signed)
I spoke to her Today she is somewhat better  She will stay on 30 mg prednisone - had been on 20 mg qd  Please do the following  1) CBC, ferritin, CMET - she will come this week dx Crohn's colitis  2) Schedule phone visit for 1- 2 weeks please

## 2018-11-14 ENCOUNTER — Other Ambulatory Visit (INDEPENDENT_AMBULATORY_CARE_PROVIDER_SITE_OTHER): Payer: Medicare Other

## 2018-11-14 DIAGNOSIS — K50111 Crohn's disease of large intestine with rectal bleeding: Secondary | ICD-10-CM

## 2018-11-14 LAB — COMPREHENSIVE METABOLIC PANEL
ALT: 15 U/L (ref 0–35)
AST: 13 U/L (ref 0–37)
Albumin: 3.5 g/dL (ref 3.5–5.2)
Alkaline Phosphatase: 184 U/L — ABNORMAL HIGH (ref 39–117)
BUN: 14 mg/dL (ref 6–23)
CO2: 26 mEq/L (ref 19–32)
Calcium: 9.2 mg/dL (ref 8.4–10.5)
Chloride: 99 mEq/L (ref 96–112)
Creatinine, Ser: 0.95 mg/dL (ref 0.40–1.20)
GFR: 57.03 mL/min — ABNORMAL LOW (ref >60.00)
Glucose, Bld: 114 mg/dL — ABNORMAL HIGH (ref 70–99)
Potassium: 3.8 mEq/L (ref 3.5–5.1)
Sodium: 136 mEq/L (ref 135–145)
Total Bilirubin: 0.4 mg/dL (ref 0.2–1.2)
Total Protein: 7.4 g/dL (ref 6.0–8.3)

## 2018-11-14 LAB — CBC
HCT: 41.8 % (ref 36.0–46.0)
Hemoglobin: 14.1 g/dL (ref 12.0–15.0)
MCHC: 33.7 g/dL (ref 30.0–36.0)
MCV: 94.1 fl (ref 78.0–100.0)
Platelets: 656 10*3/uL — ABNORMAL HIGH (ref 150.0–400.0)
RBC: 4.44 Mil/uL (ref 3.87–5.11)
RDW: 14.5 % (ref 11.5–15.5)
WBC: 14.9 10*3/uL — ABNORMAL HIGH (ref 4.0–10.5)

## 2018-11-14 LAB — FERRITIN: Ferritin: 71 ng/mL (ref 10.0–291.0)

## 2018-11-18 ENCOUNTER — Encounter: Payer: Self-pay | Admitting: General Surgery

## 2018-11-19 ENCOUNTER — Encounter: Payer: Self-pay | Admitting: Internal Medicine

## 2018-11-19 ENCOUNTER — Other Ambulatory Visit: Payer: Self-pay

## 2018-11-19 ENCOUNTER — Ambulatory Visit (INDEPENDENT_AMBULATORY_CARE_PROVIDER_SITE_OTHER): Payer: Medicare Other | Admitting: Internal Medicine

## 2018-11-19 DIAGNOSIS — K50111 Crohn's disease of large intestine with rectal bleeding: Secondary | ICD-10-CM | POA: Diagnosis not present

## 2018-11-19 DIAGNOSIS — D500 Iron deficiency anemia secondary to blood loss (chronic): Secondary | ICD-10-CM | POA: Diagnosis not present

## 2018-11-19 DIAGNOSIS — K743 Primary biliary cirrhosis: Secondary | ICD-10-CM | POA: Insufficient documentation

## 2018-11-19 DIAGNOSIS — R748 Abnormal levels of other serum enzymes: Secondary | ICD-10-CM

## 2018-11-19 NOTE — Progress Notes (Signed)
TELEHEALTH ENCOUNTER IN SETTING OF COVID-19 PANDEMIC - REQUESTED BY PATIENT SERVICE PROVIDED BY TELEMEDECINE - TYPE: Phone PATIENT LOCATION: Home PATIENT HAS CONSENTED TO TELEHEALTH VISIT PROVIDER LOCATION: OFFICE PARTICIPANTS OTHER THAN PATIENT:none TIME SPENT ON CALL: 15 minutes    [name] [name] 78 y.o. [birth year] [id number]  Assessment & Plan:  Crohn's disease of colon with rectal bleeding ([facility]) Better Stay at 30 mg/day prednisone and in 1 week reduce to 25 mg daily x 2 weeks then 20 mg daily REV 1 month  Iron deficiency anemia due to chronic blood loss Nl Hgb abd ferritin  Alkaline phosphatase elevation ? Bone vs liver or could be intestine also Recheck in 1 month      Subjective:   Chief Complaint: f/u Crohn's colitis  HPI BM's are "steady" - 1-3 a day no diarrhea Rare bleeding Abdominal pain - "it takes spells" - some here and there She is better than from phone call 1-2 weeks  Frustrated with Covid-19 quarantine  Wt Readings from Last 3 Encounters:  11/19/18 171 lb (77.6 kg)  08/06/18 171 lb 8 oz (77.8 kg)  04/15/18 177 lb 9.6 oz (80.6 kg)     Allergies  Allergen Reactions  . Azathioprine Nausea Only    Elevated lipase also - ? Pancreatitis   . Entyvio [Vedolizumab] Shortness Of Breath  . Adhesive [Tape] Rash    Taking skin off  . Sulfa Antibiotics Itching and Rash   Current Meds  Medication Sig  . b complex vitamins capsule Take 1 capsule by mouth daily.    Marland Kitchen BYSTOLIC 10 MG tablet Take 1 tablet (10 mg total) by mouth daily. Annual appt is due must see provider for future refills  . cetirizine (ZYRTEC) 10 MG tablet Take 10 mg by mouth daily as needed for allergies.   Marland Kitchen dicyclomine (BENTYL) 20 MG tablet TAKE 1 TABLET (20 MG TOTAL) BY MOUTH 3 (THREE) TIMES DAILY. FOR STOMACH PAIN (Patient taking differently: Take 20 mg by mouth as needed. )  . diphenoxylate-atropine (LOMOTIL) 2.5-0.025 MG tablet TAKE 1 TO 2 TABLETS BY MOUTH EVERY 4 HOURS AS  NEEDED FOR DIARRHEA (Patient taking differently: as needed. )  . famotidine (PEPCID) 20 MG tablet Take 20 mg by mouth 2 (two) times daily as needed.   . hydrochlorothiazide (HYDRODIURIL) 25 MG tablet Take 1 tablet (25 mg total) by mouth daily. Need office visit for further refills  . Multiple Vitamins-Minerals (MULTIVITAMINS THER. W/MINERALS) TABS Take 1 tablet by mouth daily as needed.   . ondansetron (ZOFRAN) 4 MG tablet Take 1 tablet (4 mg total) by mouth every 8 (eight) hours as needed for nausea or vomiting.  . pantoprazole (PROTONIX) 40 MG tablet Take 1 tablet (40 mg total) by mouth daily.  . predniSONE (DELTASONE) 5 MG tablet Take 6 tablets (30 mg total) by mouth daily with breakfast. In 1 month reduce to 20 mg (4 pills)/day  . traMADol (ULTRAM) 50 MG tablet TAKE 1 TABLET BY MOUTH EVERY 8 HOURS AS NEEDED   Past Medical History:  Diagnosis Date  . Allergy    year around allergies   . Arthritis    shoulder  . Bell's palsy   . Blood transfusion    1966  . Blood transfusion without reported diagnosis   . Cataract    bilaterally removed  . Colon polyps    adenomatous polyp June 2012  . Complication of anesthesia    slow to wake  . Crohn's colitis ([place])   .  Dry eyes    left eye worst one  . GERD (gastroesophageal reflux disease)   . Headache(784.0)    hx migraines  . Hypertension   . Shortness of breath    Past Surgical History:  Procedure Laterality Date  . ABDOMINAL HYSTERECTOMY    . APPENDECTOMY    . CATARACT EXTRACTION Bilateral    Separate dates  . CHOLECYSTECTOMY    . COLONOSCOPY  multiple  . flex sigmoidoscopy with biospy  01/17/12  . FLEXIBLE SIGMOIDOSCOPY  06/08/2011   severe Crohn's colitis to descending colon at least  . FOOT GANGLION EXCISION     left  . SHOULDER SURGERY     right  . TONSILLECTOMY     Social History   Social History Narrative   The patient is retired and widowed.  She has 3 children and grandchildren.   Second home in [place] she  does not go there much anymore.   She is a former smoker but does not use alcohol or tobacco or drugs.   family history includes Breast cancer in her paternal aunt; Heart attack in her father; Heart disease in her brother, father, and mother; Leukemia in her maternal grandfather and mother; Ovarian cancer in her paternal aunt; Parkinsonism in her brother; Throat cancer in her paternal aunt.   Review of Systems Joint pain  Ht 5' 5"  (1.651 m)   Wt 171 lb (77.6 kg)   BMI 28.46 kg/m

## 2018-11-19 NOTE — Assessment & Plan Note (Signed)
?   Bone vs liver or could be intestine also Recheck in 1 month

## 2018-11-19 NOTE — Assessment & Plan Note (Signed)
Better Stay at 30 mg/day prednisone and in 1 week reduce to 25 mg daily x 2 weeks then 20 mg daily REV 1 month

## 2018-11-19 NOTE — Assessment & Plan Note (Signed)
Nl Hgb abd ferritin

## 2018-11-19 NOTE — Patient Instructions (Addendum)
Glad to hear you are better.    As we discussed stay on 30 mg of prednisone until May 6.  Then if you are doing okay go to 25 mg daily    May 20 go to 20 mg daily.   On or around May 18 come for labs.  We will be making an office visit appointment as well.  Call back sooner if any problems.    I appreciate the opportunity to care for you. [name], MD, [name]

## 2018-12-10 ENCOUNTER — Encounter: Payer: Self-pay | Admitting: Internal Medicine

## 2018-12-18 ENCOUNTER — Ambulatory Visit (INDEPENDENT_AMBULATORY_CARE_PROVIDER_SITE_OTHER): Payer: Medicare Other | Admitting: Internal Medicine

## 2018-12-18 ENCOUNTER — Other Ambulatory Visit: Payer: Self-pay

## 2018-12-18 ENCOUNTER — Encounter: Payer: Self-pay | Admitting: Internal Medicine

## 2018-12-18 VITALS — Ht 66.0 in | Wt 176.0 lb

## 2018-12-18 DIAGNOSIS — R748 Abnormal levels of other serum enzymes: Secondary | ICD-10-CM | POA: Diagnosis not present

## 2018-12-18 DIAGNOSIS — Z7952 Long term (current) use of systemic steroids: Secondary | ICD-10-CM

## 2018-12-18 DIAGNOSIS — K50111 Crohn's disease of large intestine with rectal bleeding: Secondary | ICD-10-CM | POA: Diagnosis not present

## 2018-12-18 MED ORDER — DICYCLOMINE HCL 20 MG PO TABS: 20.0000 mg | ORAL_TABLET | Freq: Three times a day (TID) | ORAL | 3 refills | Status: DC | PRN

## 2018-12-18 MED ORDER — MESALAMINE 400 MG PO CPDR: 1600.0000 mg | DELAYED_RELEASE_CAPSULE | Freq: Three times a day (TID) | ORAL | 2 refills | Status: DC

## 2018-12-18 NOTE — Assessment & Plan Note (Addendum)
Stable to improved from last month though it does appear that she is requiring higher doses of prednisone at a minimum to maintain a reasonable quality of life and disease control.  She feels like she has more pain when she gets down to 20 mg.  She will stay on 20 mg of prednisone and I am going to retry mesalamine at 4.8 g total dose daily.  Return visit in approximately 1 month.

## 2018-12-18 NOTE — Assessment & Plan Note (Signed)
She remains aware of the potential long-term problems of steroid use but due to side effects cost issues etc. lack of response this is been the mainstay of treatment for her Crohn's disease.

## 2018-12-18 NOTE — Assessment & Plan Note (Addendum)
Recheck LFTs and check alkaline phosphatase isoenzymes.  5 prime nucleotidase as well as a GGT. Further plans pending these results.  Bone liver or intestine are possible sources are perhaps some combination.  If it appears to be liver we need to consider imaging looking for primary sclerosing cholangitis.

## 2018-12-18 NOTE — Patient Instructions (Addendum)
As we discussed I want you to come in for some lab test when you are able to this week or next week.  The orders are in.  I have refilled dicyclomine and we will retry mesalamine treatment for your Crohn's disease.  I want to have a visit with you in about 1 month and we will arrange that.  If things worsen before then please contact me.   I appreciate the opportunity to care for you. [name], MD, [name]

## 2018-12-18 NOTE — Progress Notes (Signed)
TELEHEALTH ENCOUNTER IN SETTING OF COVID-19 PANDEMIC - REQUESTED BY PATIENT SERVICE PROVIDED BY TELEMEDECINE - TYPE: telephone - AV not possible PATIENT LOCATION:home PATIENT HAS CONSENTED TO TELEHEALTH VISIT PROVIDER LOCATION: OFFICE REFERRING PROVIDER:N/APARTICIPANTS OTHER THAN PATIENT:none TIME SPENT ON CALL: 86 mins   [name] [name] 78 y.o. September 02, 1940 [id number]  Assessment & Plan:  Crohn's disease of colon with rectal bleeding ([place]) Stable to improved from last month though it does appear that she is requiring higher doses of prednisone at a minimum to maintain a reasonable quality of life and disease control.  She feels like she has more pain when she gets down to 20 mg.  She will stay on 20 mg of prednisone and I am going to retry mesalamine at 4.8 g total dose daily.  Return visit in approximately 1 month.  Alkaline phosphatase elevation Recheck LFTs and check alkaline phosphatase isoenzymes.  5 prime nucleotidase as well as a GGT. Further plans pending these results.  Bone liver or intestine are possible sources are perhaps some combination.  If it appears to be liver we need to consider imaging looking for primary sclerosing cholangitis.  Long term (current) use of systemic steroids-prednisone She remains aware of the potential long-term problems of steroid use but due to side effects cost issues etc. lack of response this is been the mainstay of treatment for her Crohn's disease.      Subjective:   Chief Complaint: f/u Crohn's  HPI [name] [name] reports that diarrhea is episodic and not too much of a problem.  She is having some right lower quadrant cramps she is out of her dicyclomine.  She thinks that when the prednisone went down to 20 mg after tapering 30-25-20 over the past month she started having pain again. She has been staying in and avoiding contact with others as much as possible in the COVID-19 pandemic.   Wt Readings from Last 3 Encounters:  12/18/18 176 lb  (79.8 kg)  11/19/18 171 lb (77.6 kg)  08/06/18 171 lb 8 oz (77.8 kg)    Allergies  Allergen Reactions  . Azathioprine Nausea Only    Elevated lipase also - ? Pancreatitis   . Entyvio [Vedolizumab] Shortness Of Breath  . Adhesive [Tape] Rash    Taking skin off  . Sulfa Antibiotics Itching and Rash   Current Meds  Medication Sig  . acetaminophen (TYLENOL) 325 MG tablet Take 650 mg by mouth every 6 (six) hours as needed for moderate pain.  Marland Kitchen b complex vitamins capsule Take 1 capsule by mouth daily.    Marland Kitchen BYSTOLIC 10 MG tablet Take 1 tablet (10 mg total) by mouth daily. Annual appt is due must see provider for future refills  . cetirizine (ZYRTEC) 10 MG tablet Take 10 mg by mouth daily as needed for allergies.   . famotidine (PEPCID) 20 MG tablet Take 20 mg by mouth 2 (two) times daily as needed.   . hydrochlorothiazide (HYDRODIURIL) 25 MG tablet Take 1 tablet (25 mg total) by mouth daily. Need office visit for further refills  . loperamide (IMODIUM A-D) 2 MG tablet Take 2 mg by mouth 4 (four) times daily as needed for diarrhea or loose stools.  . Multiple Vitamins-Minerals (MULTIVITAMINS THER. W/MINERALS) TABS Take 1 tablet by mouth daily as needed.   . pantoprazole (PROTONIX) 40 MG tablet Take 1 tablet (40 mg total) by mouth daily.  . predniSONE (DELTASONE) 10 MG tablet Take 20 mg by mouth 2 (two) times daily with a meal.  Past Medical History:  Diagnosis Date  . Allergy    year around allergies   . Arthritis    shoulder  . Bell's palsy   . Blood transfusion    1966  . Blood transfusion without reported diagnosis   . Cataract    bilaterally removed  . Colon polyps    adenomatous polyp June 2012  . Complication of anesthesia    slow to wake  . Crohn's colitis ([place])   . Dry eyes    left eye worst one  . GERD (gastroesophageal reflux disease)   . Headache(784.0)    hx migraines  . Hypertension   . Shortness of breath    Past Surgical History:  Procedure Laterality  Date  . ABDOMINAL HYSTERECTOMY    . APPENDECTOMY    . CATARACT EXTRACTION Bilateral    Separate dates  . CHOLECYSTECTOMY    . COLONOSCOPY  multiple  . flex sigmoidoscopy with biospy  01/17/12  . FLEXIBLE SIGMOIDOSCOPY  06/08/2011   severe Crohn's colitis to descending colon at least  . FOOT GANGLION EXCISION     left  . SHOULDER SURGERY     right  . TONSILLECTOMY     Social History   Social History Narrative   The patient is retired and widowed.  She has 3 children and grandchildren.   Second home in [place] she does not go there much anymore.   She is a former smoker but does not use alcohol or tobacco or drugs.   4 dogs   family history includes Breast cancer in her paternal aunt; Heart attack in her father; Heart disease in her brother, father, and mother; Leukemia in her maternal grandfather and mother; Ovarian cancer in her paternal aunt; Parkinsonism in her brother; Throat cancer in her paternal aunt.   Review of Systems As above

## 2018-12-19 ENCOUNTER — Other Ambulatory Visit (INDEPENDENT_AMBULATORY_CARE_PROVIDER_SITE_OTHER): Payer: Medicare Other

## 2018-12-19 DIAGNOSIS — R748 Abnormal levels of other serum enzymes: Secondary | ICD-10-CM

## 2018-12-19 DIAGNOSIS — K50111 Crohn's disease of large intestine with rectal bleeding: Secondary | ICD-10-CM

## 2018-12-19 LAB — HEPATIC FUNCTION PANEL
ALT: 24 U/L (ref 0–35)
AST: 26 U/L (ref 0–37)
Albumin: 3.3 g/dL — ABNORMAL LOW (ref 3.5–5.2)
Alkaline Phosphatase: 202 U/L — ABNORMAL HIGH (ref 39–117)
Bilirubin, Direct: 0.1 mg/dL (ref 0.0–0.3)
Total Bilirubin: 0.3 mg/dL (ref 0.2–1.2)
Total Protein: 7.3 g/dL (ref 6.0–8.3)

## 2018-12-19 LAB — GAMMA GT: GGT: 284 U/L — ABNORMAL HIGH (ref 7–51)

## 2018-12-20 LAB — ALKALINE PHOSPHATASE: Alkaline phosphatase (APISO): 205 U/L — ABNORMAL HIGH (ref 37–153)

## 2018-12-25 LAB — NUCLEOTIDASE, 5', BLOOD: 5-Nucleotidase: 18 U/L — ABNORMAL HIGH (ref 0–10)

## 2019-01-22 ENCOUNTER — Ambulatory Visit (INDEPENDENT_AMBULATORY_CARE_PROVIDER_SITE_OTHER): Payer: Medicare Other | Admitting: Internal Medicine

## 2019-01-22 ENCOUNTER — Encounter: Payer: Self-pay | Admitting: Internal Medicine

## 2019-01-22 DIAGNOSIS — K50111 Crohn's disease of large intestine with rectal bleeding: Secondary | ICD-10-CM | POA: Diagnosis not present

## 2019-01-22 DIAGNOSIS — R413 Other amnesia: Secondary | ICD-10-CM | POA: Diagnosis not present

## 2019-01-22 NOTE — Progress Notes (Signed)
TELEHEALTH ENCOUNTER IN SETTING OF COVID-19 PANDEMIC - REQUESTED BY PATIENT SERVICE PROVIDED BY TELEMEDECINE - TYPE: Telephone PATIENT LOCATION: Home PATIENT HAS CONSENTED TO TELEHEALTH VISIT PROVIDER LOCATION: OFFICE REFERRING PROVIDER: Not applicable PARTICIPANTS OTHER THAN PATIENT: None TIME SPENT ON CALL: 10 minutes    [name] [name] Arman 78 y.o. [birth year] [id number]  Assessment & Plan:   Crohn's disease of colon with rectal bleeding ([place]) This seems stable to perhaps slightly improved.  I am not sure if she started her mesalamine.  She has been steroid-dependent and I elected to add that back at last visit.  She seems to have some memory issues at this point.  Either that or she has surrendered control of her treatment to her son and daughter-in-law.  I need to clarify her medication list and will regroup after that.  Memory change This was evident today, question component of depressed mood.  Certainly possible given the coronavirus lockdown to have a situational issue.    Subjective:   Chief Complaint: Follow-up of Crohn's colitis  HPI The patient is an elderly white woman with a history of Crohn's colitis that is only responded to steroids or she was either intolerant of Biologics immunomodulators or unable to afford certain Biologics.  She had a visit with me a month ago and was somewhat stable with some persistent diarrhea and bleeding and I added mesalamine to her treatment regimen.  She does not recall starting a new medicine and says "you will have to ask my son or daughter-in-law".  Has a bit of a flat affect today but is not suicidal or anything remotely closed but does say she is feeling down and I have good and bad days".  Not very elaborative regarding the history. Allergies  Allergen Reactions  . Azathioprine Nausea Only    Elevated lipase also - ? Pancreatitis   . Entyvio [Vedolizumab] Shortness Of Breath  . Adhesive [Tape] Rash    Taking skin off  . Sulfa  Antibiotics Itching and Rash   Current Meds  Medication Sig  . acetaminophen (TYLENOL) 325 MG tablet Take 650 mg by mouth every 6 (six) hours as needed for moderate pain.  Marland Kitchen b complex vitamins capsule Take 1 capsule by mouth daily.    Marland Kitchen BYSTOLIC 10 MG tablet Take 1 tablet (10 mg total) by mouth daily. Annual appt is due must see provider for future refills  . cetirizine (ZYRTEC) 10 MG tablet Take 10 mg by mouth daily as needed for allergies.   Marland Kitchen dicyclomine (BENTYL) 20 MG tablet Take 1 tablet (20 mg total) by mouth 3 (three) times daily as needed for spasms (abdominal pain).  . famotidine (PEPCID) 20 MG tablet Take 20 mg by mouth 2 (two) times daily as needed.   . hydrochlorothiazide (HYDRODIURIL) 25 MG tablet Take 1 tablet (25 mg total) by mouth daily. Need office visit for further refills  . loperamide (IMODIUM A-D) 2 MG tablet Take 2 mg by mouth 4 (four) times daily as needed for diarrhea or loose stools.  . Mesalamine (ASACOL) 400 MG CPDR DR capsule Take 4 capsules (1,600 mg total) by mouth 3 (three) times daily. May take 6 capsules twice daily  . Multiple Vitamins-Minerals (MULTIVITAMINS THER. W/MINERALS) TABS Take 1 tablet by mouth daily as needed.   . pantoprazole (PROTONIX) 40 MG tablet Take 1 tablet (40 mg total) by mouth daily.  . predniSONE (DELTASONE) 10 MG tablet Take 20 mg by mouth 2 (two) times daily with a meal.  Past Medical History:  Diagnosis Date  . Allergy    year around allergies   . Arthritis    shoulder  . Bell's palsy   . Blood transfusion    1966  . Blood transfusion without reported diagnosis   . Cataract    bilaterally removed  . Colon polyps    adenomatous polyp June 2012  . Complication of anesthesia    slow to wake  . Crohn's colitis ([place])   . Dry eyes    left eye worst one  . GERD (gastroesophageal reflux disease)   . Headache(784.0)    hx migraines  . Hypertension   . Shortness of breath    Past Surgical History:  Procedure Laterality Date   . ABDOMINAL HYSTERECTOMY    . APPENDECTOMY    . CATARACT EXTRACTION Bilateral    Separate dates  . CHOLECYSTECTOMY    . COLONOSCOPY  multiple  . flex sigmoidoscopy with biospy  01/17/12  . FLEXIBLE SIGMOIDOSCOPY  06/08/2011   severe Crohn's colitis to descending colon at least  . FOOT GANGLION EXCISION     left  . SHOULDER SURGERY     right  . TONSILLECTOMY     Social History   Social History Narrative   The patient is retired and widowed.  She has 3 children and grandchildren.   Second home in [place] she does not go there much anymore.   She is a former smoker but does not use alcohol or tobacco or drugs.   4 dogs   family history includes Breast cancer in her paternal aunt; Heart attack in her father; Heart disease in her brother, father, and mother; Leukemia in her maternal grandfather and mother; Ovarian cancer in her paternal aunt; Parkinsonism in her brother; Throat cancer in her paternal aunt.   Review of Systems As per HPI

## 2019-01-22 NOTE — Patient Instructions (Addendum)
As we discussed I cannot tell exactly which medications you are on.  In particular you are not aware if you are on a new medication called mesalamine that I wanted to start in late May.  It sounds like you probably are not taking that since it involves taking 12 pills a day.   We will send this message out in the mail or by my chart for your son or daughter-in-law to check on your medication list.  I need to know exactly what you are on, especially when it comes to the Crohn's medications that would be prednisone and mesalamine.  Once I get that information we will come up with a plan for follow-up.  I appreciate the opportunity to care for you. [name], MD, [name]

## 2019-01-24 NOTE — Assessment & Plan Note (Signed)
This seems stable to perhaps slightly improved.  I am not sure if she started her mesalamine.  She has been steroid-dependent and I elected to add that back at last visit.  She seems to have some memory issues at this point.  Either that or she has surrendered control of her treatment to her son and daughter-in-law.  I need to clarify her medication list and will regroup after that.

## 2019-01-24 NOTE — Assessment & Plan Note (Signed)
This was evident today, question component of depressed mood.  Certainly possible given the coronavirus lockdown to have a situational issue.

## 2019-01-26 ENCOUNTER — Telehealth: Payer: Self-pay

## 2019-01-26 NOTE — Telephone Encounter (Signed)
I spoke with [name] [name]'s daughter in law [name] [name] and we updated her medicine list removing multiple medicines. She never got the asacol due to cost. She is out of pepcid, she is not taking any vitamins. They are very concerned about her. She isn't eating much and she sleeps a lot. They found her in the floor today, she said she slipped out of the chair playing with the puppy. She couldn't get up off the toilet yesterday.  She is losing muscle mass per [name] [name]. She doesn't have a life alert and won't even keep her cell phone in her pocket. [name] [name] can be reached at # [phone]. [name] [name] lives with them.

## 2019-01-26 NOTE — Telephone Encounter (Signed)
Spoke to son [name] and daughter-in-law Rosana Fret is weak and requiring assistance to stand Difficulty even w/ walker  Hypersomnolent, forgetful and anhedonic  ? Depression, ? From other problems she has   Plan  1) Have her get CBC, CMET, B12, TSH dx Crohn's and generalized weakness 2) Schedule complete abdominal ultrasound re: elevated alkaline phosphatase 3) Am ccing Dr. Sharlet Salina her PCP as I think she needs to be seen by her this week re these problems 4) Advised to take her to ED for severe problems

## 2019-01-27 ENCOUNTER — Emergency Department (HOSPITAL_COMMUNITY): Payer: Medicare Other

## 2019-01-27 ENCOUNTER — Encounter (HOSPITAL_COMMUNITY): Payer: Self-pay

## 2019-01-27 ENCOUNTER — Inpatient Hospital Stay (HOSPITAL_COMMUNITY)
Admission: EM | Admit: 2019-01-27 | Discharge: 2019-02-02 | DRG: 386 | Disposition: A | Discharge status: Skilled Nursing Facility | Payer: Medicare Other | Attending: Internal Medicine | Admitting: Internal Medicine

## 2019-01-27 ENCOUNTER — Other Ambulatory Visit: Payer: Self-pay

## 2019-01-27 DIAGNOSIS — K7290 Hepatic failure, unspecified without coma: Secondary | ICD-10-CM | POA: Diagnosis not present

## 2019-01-27 DIAGNOSIS — M19012 Primary osteoarthritis, left shoulder: Secondary | ICD-10-CM | POA: Diagnosis not present

## 2019-01-27 DIAGNOSIS — S199XXA Unspecified injury of neck, initial encounter: Secondary | ICD-10-CM | POA: Diagnosis not present

## 2019-01-27 DIAGNOSIS — E43 Unspecified severe protein-calorie malnutrition: Secondary | ICD-10-CM | POA: Diagnosis not present

## 2019-01-27 DIAGNOSIS — Z9109 Other allergy status, other than to drugs and biological substances: Secondary | ICD-10-CM

## 2019-01-27 DIAGNOSIS — R2681 Unsteadiness on feet: Secondary | ICD-10-CM | POA: Diagnosis not present

## 2019-01-27 DIAGNOSIS — M19011 Primary osteoarthritis, right shoulder: Secondary | ICD-10-CM | POA: Diagnosis not present

## 2019-01-27 DIAGNOSIS — M810 Age-related osteoporosis without current pathological fracture: Secondary | ICD-10-CM | POA: Diagnosis not present

## 2019-01-27 DIAGNOSIS — R1310 Dysphagia, unspecified: Secondary | ICD-10-CM | POA: Diagnosis not present

## 2019-01-27 DIAGNOSIS — K529 Noninfective gastroenteritis and colitis, unspecified: Secondary | ICD-10-CM | POA: Diagnosis not present

## 2019-01-27 DIAGNOSIS — E876 Hypokalemia: Secondary | ICD-10-CM | POA: Diagnosis not present

## 2019-01-27 DIAGNOSIS — Z1159 Encounter for screening for other viral diseases: Secondary | ICD-10-CM

## 2019-01-27 DIAGNOSIS — S299XXA Unspecified injury of thorax, initial encounter: Secondary | ICD-10-CM | POA: Diagnosis not present

## 2019-01-27 DIAGNOSIS — R109 Unspecified abdominal pain: Secondary | ICD-10-CM | POA: Diagnosis not present

## 2019-01-27 DIAGNOSIS — Z9181 History of falling: Secondary | ICD-10-CM | POA: Diagnosis not present

## 2019-01-27 DIAGNOSIS — M6281 Muscle weakness (generalized): Secondary | ICD-10-CM | POA: Diagnosis not present

## 2019-01-27 DIAGNOSIS — G8929 Other chronic pain: Secondary | ICD-10-CM | POA: Diagnosis present

## 2019-01-27 DIAGNOSIS — Z7952 Long term (current) use of systemic steroids: Secondary | ICD-10-CM

## 2019-01-27 DIAGNOSIS — R748 Abnormal levels of other serum enzymes: Secondary | ICD-10-CM | POA: Diagnosis present

## 2019-01-27 DIAGNOSIS — M549 Dorsalgia, unspecified: Secondary | ICD-10-CM | POA: Diagnosis present

## 2019-01-27 DIAGNOSIS — R5381 Other malaise: Secondary | ICD-10-CM | POA: Diagnosis present

## 2019-01-27 DIAGNOSIS — K5010 Crohn's disease of large intestine without complications: Secondary | ICD-10-CM | POA: Diagnosis present

## 2019-01-27 DIAGNOSIS — W19XXXA Unspecified fall, initial encounter: Secondary | ICD-10-CM | POA: Diagnosis present

## 2019-01-27 DIAGNOSIS — S0990XA Unspecified injury of head, initial encounter: Secondary | ICD-10-CM | POA: Diagnosis not present

## 2019-01-27 DIAGNOSIS — M545 Low back pain: Secondary | ICD-10-CM | POA: Diagnosis not present

## 2019-01-27 DIAGNOSIS — Z87891 Personal history of nicotine dependence: Secondary | ICD-10-CM | POA: Diagnosis not present

## 2019-01-27 DIAGNOSIS — Z90710 Acquired absence of both cervix and uterus: Secondary | ICD-10-CM

## 2019-01-27 DIAGNOSIS — N179 Acute kidney failure, unspecified: Secondary | ICD-10-CM | POA: Diagnosis present

## 2019-01-27 DIAGNOSIS — Z7401 Bed confinement status: Secondary | ICD-10-CM | POA: Diagnosis not present

## 2019-01-27 DIAGNOSIS — R52 Pain, unspecified: Secondary | ICD-10-CM | POA: Diagnosis not present

## 2019-01-27 DIAGNOSIS — R278 Other lack of coordination: Secondary | ICD-10-CM | POA: Diagnosis not present

## 2019-01-27 DIAGNOSIS — Z79899 Other long term (current) drug therapy: Secondary | ICD-10-CM | POA: Diagnosis not present

## 2019-01-27 DIAGNOSIS — E860 Dehydration: Secondary | ICD-10-CM | POA: Diagnosis present

## 2019-01-27 DIAGNOSIS — M546 Pain in thoracic spine: Secondary | ICD-10-CM | POA: Diagnosis not present

## 2019-01-27 DIAGNOSIS — R51 Headache: Secondary | ICD-10-CM | POA: Diagnosis not present

## 2019-01-27 DIAGNOSIS — Z9049 Acquired absence of other specified parts of digestive tract: Secondary | ICD-10-CM | POA: Diagnosis not present

## 2019-01-27 DIAGNOSIS — Z888 Allergy status to other drugs, medicaments and biological substances status: Secondary | ICD-10-CM

## 2019-01-27 DIAGNOSIS — M2550 Pain in unspecified joint: Secondary | ICD-10-CM | POA: Diagnosis not present

## 2019-01-27 DIAGNOSIS — I10 Essential (primary) hypertension: Secondary | ICD-10-CM | POA: Diagnosis not present

## 2019-01-27 DIAGNOSIS — Z882 Allergy status to sulfonamides status: Secondary | ICD-10-CM | POA: Diagnosis not present

## 2019-01-27 DIAGNOSIS — K50919 Crohn's disease, unspecified, with unspecified complications: Secondary | ICD-10-CM | POA: Diagnosis not present

## 2019-01-27 DIAGNOSIS — K50111 Crohn's disease of large intestine with rectal bleeding: Secondary | ICD-10-CM

## 2019-01-27 DIAGNOSIS — M1612 Unilateral primary osteoarthritis, left hip: Secondary | ICD-10-CM | POA: Diagnosis not present

## 2019-01-27 DIAGNOSIS — R1084 Generalized abdominal pain: Secondary | ICD-10-CM | POA: Diagnosis not present

## 2019-01-27 DIAGNOSIS — K50811 Crohn's disease of both small and large intestine with rectal bleeding: Secondary | ICD-10-CM | POA: Diagnosis not present

## 2019-01-27 DIAGNOSIS — H04123 Dry eye syndrome of bilateral lacrimal glands: Secondary | ICD-10-CM | POA: Diagnosis not present

## 2019-01-27 DIAGNOSIS — K219 Gastro-esophageal reflux disease without esophagitis: Secondary | ICD-10-CM | POA: Diagnosis present

## 2019-01-27 DIAGNOSIS — F329 Major depressive disorder, single episode, unspecified: Secondary | ICD-10-CM | POA: Diagnosis not present

## 2019-01-27 DIAGNOSIS — R41841 Cognitive communication deficit: Secondary | ICD-10-CM | POA: Diagnosis not present

## 2019-01-27 DIAGNOSIS — R58 Hemorrhage, not elsewhere classified: Secondary | ICD-10-CM | POA: Diagnosis not present

## 2019-01-27 DIAGNOSIS — G510 Bell's palsy: Secondary | ICD-10-CM | POA: Diagnosis not present

## 2019-01-27 DIAGNOSIS — K5730 Diverticulosis of large intestine without perforation or abscess without bleeding: Secondary | ICD-10-CM | POA: Diagnosis not present

## 2019-01-27 DIAGNOSIS — R1030 Lower abdominal pain, unspecified: Secondary | ICD-10-CM | POA: Diagnosis not present

## 2019-01-27 LAB — COMPREHENSIVE METABOLIC PANEL
ALT: 19 U/L (ref 0–44)
ALT: 16 U/L (ref 0–44)
AST: 27 U/L (ref 15–41)
AST: 30 U/L (ref 15–41)
Albumin: 2.7 g/dL — ABNORMAL LOW (ref 3.5–5.0)
Albumin: 2.4 g/dL — ABNORMAL LOW (ref 3.5–5.0)
Alkaline Phosphatase: 359 U/L — ABNORMAL HIGH (ref 38–126)
Alkaline Phosphatase: 376 U/L — ABNORMAL HIGH (ref 38–126)
Anion gap: 9 (ref 5–15)
Anion gap: 8 (ref 5–15)
BUN: 38 mg/dL — ABNORMAL HIGH (ref 8–23)
BUN: 30 mg/dL — ABNORMAL HIGH (ref 8–23)
CO2: 24 mmol/L (ref 22–32)
CO2: 20 mmol/L — ABNORMAL LOW (ref 22–32)
Calcium: 8.3 mg/dL — ABNORMAL LOW (ref 8.9–10.3)
Calcium: 7.5 mg/dL — ABNORMAL LOW (ref 8.9–10.3)
Chloride: 104 mmol/L (ref 98–111)
Chloride: 107 mmol/L (ref 98–111)
Creatinine, Ser: 1.86 mg/dL — ABNORMAL HIGH (ref 0.44–1.00)
Creatinine, Ser: 1.36 mg/dL — ABNORMAL HIGH (ref 0.44–1.00)
GFR calc Af Amer: 30 mL/min — ABNORMAL LOW (ref >60)
GFR calc Af Amer: 43 mL/min — ABNORMAL LOW (ref >60)
GFR calc non Af Amer: 26 mL/min — ABNORMAL LOW (ref >60)
GFR calc non Af Amer: 37 mL/min — ABNORMAL LOW (ref >60)
Glucose, Bld: 100 mg/dL — ABNORMAL HIGH (ref 70–99)
Glucose, Bld: 70 mg/dL (ref 70–99)
Potassium: 2.7 mmol/L — CL (ref 3.5–5.1)
Potassium: 3.8 mmol/L (ref 3.5–5.1)
Sodium: 137 mmol/L (ref 135–145)
Sodium: 135 mmol/L (ref 135–145)
Total Bilirubin: 0.7 mg/dL (ref 0.3–1.2)
Total Bilirubin: 0.3 mg/dL (ref 0.3–1.2)
Total Protein: 7.2 g/dL (ref 6.5–8.1)
Total Protein: 6 g/dL — ABNORMAL LOW (ref 6.5–8.1)

## 2019-01-27 LAB — URINALYSIS, ROUTINE W REFLEX MICROSCOPIC
Bacteria, UA: NONE SEEN
Bilirubin Urine: NEGATIVE
Glucose, UA: NEGATIVE mg/dL
Ketones, ur: NEGATIVE mg/dL
Leukocytes,Ua: NEGATIVE
Nitrite: NEGATIVE
Protein, ur: NEGATIVE mg/dL
Specific Gravity, Urine: 1.012 (ref 1.005–1.030)
pH: 6 (ref 5.0–8.0)

## 2019-01-27 LAB — PHOSPHORUS: Phosphorus: 1.5 mg/dL — ABNORMAL LOW (ref 2.5–4.6)

## 2019-01-27 LAB — MAGNESIUM
Magnesium: 2.1 mg/dL (ref 1.7–2.4)
Magnesium: 2.6 mg/dL — ABNORMAL HIGH (ref 1.7–2.4)

## 2019-01-27 LAB — CBC
HCT: 42.5 % (ref 36.0–46.0)
Hemoglobin: 13.9 g/dL (ref 12.0–15.0)
MCH: 31.2 pg (ref 26.0–34.0)
MCHC: 32.7 g/dL (ref 30.0–36.0)
MCV: 95.3 fL (ref 80.0–100.0)
Platelets: 476 10*3/uL — ABNORMAL HIGH (ref 150–400)
RBC: 4.46 MIL/uL (ref 3.87–5.11)
RDW: 13.4 % (ref 11.5–15.5)
WBC: 16.1 10*3/uL — ABNORMAL HIGH (ref 4.0–10.5)
nRBC: 0 % (ref 0.0–0.2)

## 2019-01-27 LAB — LACTIC ACID, PLASMA: Lactic Acid, Venous: 1.3 mmol/L (ref 0.5–1.9)

## 2019-01-27 LAB — SARS CORONAVIRUS 2 BY RT PCR (HOSPITAL ORDER, PERFORMED IN ~~LOC~~ HOSPITAL LAB): SARS Coronavirus 2: NEGATIVE

## 2019-01-27 LAB — LIPASE, BLOOD: Lipase: 46 U/L (ref 11–51)

## 2019-01-27 MED ORDER — SODIUM CHLORIDE (PF) 0.9 % IJ SOLN
INTRAMUSCULAR | Status: AC
  Filled 2019-01-27: qty 50

## 2019-01-27 MED ORDER — MAGNESIUM SULFATE 2 GM/50ML IV SOLN
2.0000 g | Freq: Once | INTRAVENOUS | Status: AC
  Administered 2019-01-27: 13:00:00 2 g via INTRAVENOUS
  Filled 2019-01-27: qty 50

## 2019-01-27 MED ORDER — ACETAMINOPHEN 325 MG PO TABS
650.0000 mg | ORAL_TABLET | Freq: Once | ORAL | Status: AC
  Administered 2019-01-27: 650 mg via ORAL
  Filled 2019-01-27: qty 2

## 2019-01-27 MED ORDER — SODIUM CHLORIDE 0.9 % IV SOLN
INTRAVENOUS | Status: DC
  Administered 2019-01-27 – 2019-01-29 (×2): via INTRAVENOUS

## 2019-01-27 MED ORDER — METHYLPREDNISOLONE SODIUM SUCC 40 MG IJ SOLR
40.0000 mg | Freq: Two times a day (BID) | INTRAMUSCULAR | Status: DC
  Administered 2019-01-27 – 2019-01-29 (×4): 40 mg via INTRAVENOUS
  Filled 2019-01-27 (×4): qty 1

## 2019-01-27 MED ORDER — ONDANSETRON HCL 4 MG/2ML IJ SOLN
4.0000 mg | Freq: Once | INTRAMUSCULAR | Status: AC
  Administered 2019-01-27: 4 mg via INTRAVENOUS
  Filled 2019-01-27: qty 2

## 2019-01-27 MED ORDER — SODIUM CHLORIDE 0.9% FLUSH: 3.0000 mL | Freq: Once | INTRAVENOUS | Status: DC

## 2019-01-27 MED ORDER — POTASSIUM CHLORIDE CRYS ER 20 MEQ PO TBCR
40.0000 meq | EXTENDED_RELEASE_TABLET | Freq: Once | ORAL | Status: AC
  Administered 2019-01-27: 40 meq via ORAL
  Filled 2019-01-27: qty 2

## 2019-01-27 MED ORDER — POTASSIUM CHLORIDE 10 MEQ/100ML IV SOLN
10.0000 meq | INTRAVENOUS | Status: AC
  Administered 2019-01-27 (×2): 10 meq via INTRAVENOUS
  Filled 2019-01-27 (×2): qty 100

## 2019-01-27 MED ORDER — IOHEXOL 300 MG/ML  SOLN
30.0000 mL | Freq: Once | INTRAMUSCULAR | Status: AC
  Administered 2019-01-27: 14:00:00 30 mL via ORAL

## 2019-01-27 MED ORDER — SODIUM CHLORIDE 0.9 % IV BOLUS
1000.0000 mL | Freq: Once | INTRAVENOUS | Status: AC
  Administered 2019-01-27: 1000 mL via INTRAVENOUS

## 2019-01-27 MED ORDER — K PHOS MONO-SOD PHOS DI & MONO 155-852-130 MG PO TABS
500.0000 mg | ORAL_TABLET | Freq: Three times a day (TID) | ORAL | Status: AC
  Administered 2019-01-28 – 2019-01-29 (×5): 500 mg via ORAL
  Filled 2019-01-27 (×5): qty 2

## 2019-01-27 MED ORDER — PANTOPRAZOLE SODIUM 40 MG PO TBEC
40.0000 mg | DELAYED_RELEASE_TABLET | Freq: Every day | ORAL | Status: DC
  Administered 2019-01-28 – 2019-02-02 (×6): 40 mg via ORAL
  Filled 2019-01-27 (×6): qty 1

## 2019-01-27 MED ORDER — OXYCODONE-ACETAMINOPHEN 5-325 MG PO TABS
1.0000 | ORAL_TABLET | ORAL | Status: DC | PRN
  Administered 2019-01-27 – 2019-01-29 (×5): 1 via ORAL
  Filled 2019-01-27 (×5): qty 1

## 2019-01-27 NOTE — ED Provider Notes (Signed)
Care assumed from [facility], [place], at shift change, please see their notes for full documentation of patient's complaint/HPI. Briefly, pt here with 2 separate complaints; initially complaining of lower abdominal pain and bloody diarrhea for the past 2 weeks; patient has a hx of Crohn's disease and is on 20 mg Prednisone BID chronically; she is followed by Dr. Carlean Purl with GI. She also complains of a fall yesterday where he legs gave out and she landed onto her buttock area; no red flag symptoms concerning for cauda equina. Results so far show negative images including CT Head, C spine, DG T spine, and CXR. Pt with hypokalemia at 2.7 likely from the diarrhea. Elevated leukocytosis at 16.1 although patient has chronic elevation due to steroid use. Pt also mildly confused; questionable metabolic encephalopathy; per family this occurs when patient has crohn's flare; may need MRI while in the hospital although not acute; no focal neuro deficits on exam today. Awaiting CT A/P and urine. Plan is to admit patient for Crohn's flare; hypokalemia; renal failure likely due to dehydration.    Hx crohsn, generalized weakness and confusion, fell yesterday. Lower abd pain, diarrhea  Bloody x 2 weeks. Plan: CT A/P and urine, then admit. Carlean Purl GI will see patient.  ?metabolic encephalopathy, no focal defecits.  Chronic pred, elevated wbc.  Renal failure, dehydration Physical Exam  BP (!) 150/95   Pulse 87   Temp 98.9 F (37.2 C) (Rectal)   Resp (!) 21   Ht 5' 6"  (1.676 m)   Wt 79.8 kg   SpO2 100%   BMI 28.41 kg/m   Physical Exam Vitals signs and nursing note reviewed.  Constitutional:      Appearance: She is not ill-appearing.  HENT:     Head: Normocephalic and atraumatic.  Eyes:     Conjunctiva/sclera: Conjunctivae normal.  Cardiovascular:     Rate and Rhythm: Normal rate and regular rhythm.  Pulmonary:     Effort: Pulmonary effort is normal.     Breath sounds: Normal breath sounds.  Abdominal:      Tenderness: There is abdominal tenderness.  Skin:    General: Skin is warm and dry.     Coloration: Skin is not jaundiced.  Neurological:     Mental Status: She is alert.     ED Course/Procedures   Clinical Course as of Jan 27 153  Tue Jan 27, 2019  [id number] Discussed case with Dr. Marthenia Rolling with hospitalist service who recommends repeat CMP, Mag, and Phosphorus given patient has been in the ED for almost 8 hours. He agrees to accept patient for admission.    [MV]    Clinical Course User Index [MV] Eustaquio Maize, PA-C    Procedures  MDM  CT scan with findings of colitis vs infection; no change in white blood cell count to suggest infectious etiology; patient without a fever in the ED and normal lactic acid. Suspect crohn's flare at this point in time. Urine without signs of infection as well. Will call hospitalist for admission with Dr. Carlean Purl following.   Dr. Marthenia Rolling to admit. Updated patient's family on plan as well. They and patient are in agreement with plan at this time.        Eustaquio Maize, PA-C 01/28/19 0154    Hayden Rasmussen, MD 01/28/19 [id number]

## 2019-01-27 NOTE — H&P (Signed)
History and Physical  [name] [name] LSL:[id number] DOB: [birth year] DOA: 01/27/2019  Referring physician: ER provider PCP: Hoyt Koch, MD  Outpatient Specialists: GI, Dr. Carlean Purl Patient coming from: Home  Chief Complaint: Abdominal pain  HPI:  Patient is a 78 year old lady who with past medical history significant for Crohn's disease, hypertension, GERD, allergies and Bell's palsy.  Patient is not a particularly good historian.  Apparently, patient developed abdominal pain about 4 to 5 days prior to presentation, and was said to have falling yesterday.  Collateral information also revealed bloody diarrhea in the last 2 weeks, but no significant drop in hemoglobin is noted, however, patient may be volume depleted.  On presentation to the hospital, potassium was found to be 2.7 a phosphorus of 1.5.  Patient was on HCTZ prior to presentation.  CT scan of the abdomen done revealed colitis involving the descending and sigmoid colon.  Patient will be admitted for further assessment and management.  No headache, no neck pain, no chest pain, no shortness of breath no urinary symptoms.  ED Course: On presentation to the ER, temperature was 98.8, blood pressure 137/88, heart rate of 90, respiratory rate of 21.  O2 sat is 97% on room air.  Pertinent labs: Sodium of 137, potassium of 2.7, chloride 104, CO2 of 24, BUN of 38 and creatinine of 1.86 with blood sugar of 100.  Magnesium is 2.1, alkaline phosphatase is 359, albumin is 2.7, total protein is 7.1, AST is 27 and ALT is 19.  CBC reveals WBC of 16.1, hemoglobin of 13.9, hematocrit of 42.5, MCV of 95.3 with platelet count of 176.  Repeat COVID-19 testing is negative.  EKG: Independently reviewed.  Low voltage precordial leads noted.  Imaging: independently reviewed.  CT abdomen and pelvis without contrast revealed "Descending and sigmoid colon colitis, either infectious or inflammatory.  Milder inflammatory changes of the transverse colon.   Calcific atherosclerotic disease of the aorta".   Review of Systems:  Negative for fever, visual changes, sore throat, rash, new muscle aches, chest pain, SOB, dysuria.  Past Medical History:  Diagnosis Date   Allergy    year around allergies    Arthritis    shoulder   Bell's palsy    Blood transfusion    1966   Blood transfusion without reported diagnosis    Cataract    bilaterally removed   Colon polyps    adenomatous polyp June 1157   Complication of anesthesia    slow to wake   Crohn's colitis ([place])    Dry eyes    left eye worst one   GERD (gastroesophageal reflux disease)    Headache(784.0)    hx migraines   Hypertension    Shortness of breath     Past Surgical History:  Procedure Laterality Date   ABDOMINAL HYSTERECTOMY     APPENDECTOMY     CATARACT EXTRACTION Bilateral    Separate dates   CHOLECYSTECTOMY     COLONOSCOPY  multiple   flex sigmoidoscopy with biospy  01/17/12   FLEXIBLE SIGMOIDOSCOPY  06/08/2011   severe Crohn's colitis to descending colon at least   [place]     left   SHOULDER SURGERY     right   TONSILLECTOMY       reports that she quit smoking about 50 years ago. Her smoking use included cigarettes. She has a 2.50 pack-year smoking history. She has never used smokeless tobacco. She reports current alcohol use. She reports that she does not  use drugs.  Allergies  Allergen Reactions   Azathioprine Nausea Only    Elevated lipase also - ? Pancreatitis    Entyvio [Vedolizumab] Shortness Of Breath   Adhesive [Tape] Rash    Taking skin off   Sulfa Antibiotics Itching and Rash    Family History  Problem Relation Age of Onset   Leukemia Mother    Heart disease Mother    Heart disease Father    Heart attack Father    Ovarian cancer Paternal Aunt        1/2 aunt   Breast cancer Paternal 39        1/2 aunt   Throat cancer Paternal Aunt        1/2 aunt   Parkinsonism Brother     Heart disease Brother    Leukemia Maternal Grandfather    Anesthesia problems Neg Hx    Hypotension Neg Hx    Malignant hyperthermia Neg Hx    Pseudochol deficiency Neg Hx    Colon cancer Neg Hx    Diabetes Neg Hx    Esophageal cancer Neg Hx    Rectal cancer Neg Hx    Stomach cancer Neg Hx      Prior to Admission medications   Medication Sig Start Date End Date Taking? Authorizing Provider  acetaminophen (TYLENOL) 325 MG tablet Take 650 mg by mouth every 6 (six) hours as needed for moderate pain.   Yes [provider]  dicyclomine (BENTYL) 20 MG tablet Take 1 tablet (20 mg total) by mouth 3 (three) times daily as needed for spasms (abdominal pain). 12/18/18  Yes [name], MD  hydrochlorothiazide (HYDRODIURIL) 25 MG tablet Take 1 tablet (25 mg total) by mouth daily. Need office visit for further refills 01/27/18  Yes Hoyt Koch, MD  Multiple Vitamin (MULTIVITAMIN WITH MINERALS) TABS tablet Take 1 tablet by mouth daily.   Yes [provider]  predniSONE (DELTASONE) 10 MG tablet Take 20 mg by mouth 2 (two) times daily with a meal.   Yes [provider]    Physical Exam: Vitals:   01/27/19 1900 01/27/19 1930 01/27/19 2000 01/27/19 2030  BP: (!) 145/75 (!) 153/70 (!) 138/97 137/88  Pulse: 85 82 94 90  Resp: (!) 21 14 (!) 22 (!) 21  Temp:      TempSrc:      SpO2: 98% 97% 94% 97%  Weight:      Height:        Constitutional:   Appears calm and comfortable Eyes:   No pallor. No jaundice.  ENMT:   external ears, nose appear normal Neck:   Neck is supple. No JVD Respiratory:   CTA bilaterally, no w/r/r.   Respiratory effort normal. No retractions or accessory muscle use Cardiovascular:   S1S2  No LE extremity edema   Abdomen:   Abdomen is soft and non tender. Organs are difficult to assess. Neurologic:   Awake and alert.  Moves all limbs.  Wt Readings from Last 3 Encounters:  01/27/19 79.8 kg  01/22/19 79.8  kg  12/18/18 79.8 kg    I have personally reviewed following labs and imaging studies  Labs on Admission:  CBC: Recent Labs  Lab 01/27/19 1151  WBC 16.1*  HGB 13.9  HCT 42.5  MCV 95.3  PLT 814*   Basic Metabolic Panel: Recent Labs  Lab 01/27/19 1151 01/27/19 1836  NA 137 135  K 2.7* 3.8  CL 104 107  CO2 24 20*  GLUCOSE  100* 70  BUN 38* 30*  CREATININE 1.86* 1.36*  CALCIUM 8.3* 7.5*  MG 2.1 2.6*  PHOS  --  1.5*   Liver Function Tests: Recent Labs  Lab 01/27/19 1151 01/27/19 1836  AST 27 30  ALT 19 16  ALKPHOS 359* 376*  BILITOT 0.7 0.3  PROT 7.2 6.0*  ALBUMIN 2.7* 2.4*   Recent Labs  Lab 01/27/19 1151  LIPASE 46   No results for input(s): AMMONIA in the last 168 hours. Coagulation Profile: No results for input(s): INR, PROTIME in the last 168 hours. Cardiac Enzymes: No results for input(s): CKTOTAL, CKMB, CKMBINDEX, TROPONINI in the last 168 hours. BNP (last 3 results) No results for input(s): PROBNP in the last 8760 hours. HbA1C: No results for input(s): HGBA1C in the last 72 hours. CBG: No results for input(s): GLUCAP in the last 168 hours. Lipid Profile: No results for input(s): CHOL, HDL, LDLCALC, TRIG, CHOLHDL, LDLDIRECT in the last 72 hours. Thyroid Function Tests: No results for input(s): TSH, T4TOTAL, FREET4, T3FREE, THYROIDAB in the last 72 hours. Anemia Panel: No results for input(s): VITAMINB12, FOLATE, FERRITIN, TIBC, IRON, RETICCTPCT in the last 72 hours. Urine analysis:    Component Value Date/Time   COLORURINE YELLOW 01/27/2019 1703   APPEARANCEUR CLEAR 01/27/2019 1703   LABSPEC 1.012 01/27/2019 1703   PHURINE 6.0 01/27/2019 1703   GLUCOSEU NEGATIVE 01/27/2019 1703   HGBUR LARGE (A) 01/27/2019 1703   BILIRUBINUR NEGATIVE 01/27/2019 1703   KETONESUR NEGATIVE 01/27/2019 1703   PROTEINUR NEGATIVE 01/27/2019 1703   UROBILINOGEN 0.2 12/13/2014 1506   NITRITE NEGATIVE 01/27/2019 1703   LEUKOCYTESUR NEGATIVE 01/27/2019 1703    Sepsis Labs: @LABRCNTIP (procalcitonin:4,lacticidven:4) ) Recent Results (from the past 240 hour(s))  SARS Coronavirus 2 (CEPHEID - Performed in [facility] hospital lab), Hosp Order     Status: None   Collection Time: 01/27/19 12:26 PM   Specimen: Nasopharyngeal Swab  Result Value Ref Range Status   SARS Coronavirus 2 NEGATIVE NEGATIVE Final    Comment: (NOTE) If result is NEGATIVE SARS-CoV-2 target nucleic acids are NOT DETECTED. The SARS-CoV-2 RNA is generally detectable in upper and lower  respiratory specimens during the acute phase of infection. The lowest  concentration of SARS-CoV-2 viral copies this assay can detect is 250  copies / mL. A negative result does not preclude SARS-CoV-2 infection  and should not be used as the sole basis for treatment or other  patient management decisions.  A negative result may occur with  improper specimen collection / handling, submission of specimen other  than nasopharyngeal swab, presence of viral mutation(s) within the  areas targeted by this assay, and inadequate number of viral copies  (<250 copies / mL). A negative result must be combined with clinical  observations, patient history, and epidemiological information. If result is POSITIVE SARS-CoV-2 target nucleic acids are DETECTED. The SARS-CoV-2 RNA is generally detectable in upper and lower  respiratory specimens dur ing the acute phase of infection.  Positive  results are indicative of active infection with SARS-CoV-2.  Clinical  correlation with patient history and other diagnostic information is  necessary to determine patient infection status.  Positive results do  not rule out bacterial infection or co-infection with other viruses. If result is PRESUMPTIVE POSTIVE SARS-CoV-2 nucleic acids MAY BE PRESENT.   A presumptive positive result was obtained on the submitted specimen  and confirmed on repeat testing.  While 2019 novel coronavirus  (SARS-CoV-2) nucleic acids may be  present in the submitted sample  additional confirmatory testing may be necessary for epidemiological  and / or clinical management purposes  to differentiate between  SARS-CoV-2 and other Sarbecovirus currently known to infect humans.  If clinically indicated additional testing with an alternate test  methodology [phone]) is advised. The SARS-CoV-2 RNA is generally  detectable in upper and lower respiratory sp ecimens during the acute  phase of infection. The expected result is Negative. Fact Sheet for Patients:  StrictlyIdeas.no Fact Sheet for Healthcare Providers: BankingDealers.co.za This test is not yet approved or cleared by the [place] FDA and has been authorized for detection and/or diagnosis of SARS-CoV-2 by FDA under an Emergency Use Authorization (EUA).  This EUA will remain in effect (meaning this test can be used) for the duration of the COVID-19 declaration under Section 564(b)(1) of the Act, 21 U.S.C. section 360bbb-3(b)(1), unless the authorization is terminated or revoked sooner. Performed at [facility], [place] [address] [place], [place] [address]       Radiological Exams on Admission: Ct Abdomen Pelvis Wo Contrast  Result Date: 01/27/2019 CLINICAL DATA:  Lower abdominal pain for 4-5 days. EXAM: CT ABDOMEN AND PELVIS WITHOUT CONTRAST TECHNIQUE: Multidetector CT imaging of the abdomen and pelvis was performed following the standard protocol without IV contrast. COMPARISON:  None. FINDINGS: Lower chest: No acute abnormality. Calcific atherosclerotic disease of the coronary arteries and aorta. Hepatobiliary: No focal liver abnormality is seen. Status post cholecystectomy. No biliary dilatation. Pancreas: Unremarkable. No pancreatic ductal dilatation or surrounding inflammatory changes. Spleen: Normal in size without focal abnormality. Adrenals/Urinary Tract: Adrenal glands are unremarkable. Kidneys are  normal, without renal calculi, focal lesion, or hydronephrosis. Bladder is unremarkable. Stomach/Bowel: Stomach is within normal limits. Appendix appears normal. No evidence of small bowel wall thickening, distention, or inflammatory changes. Circumferential mucosal thickening of the descending and sigmoid colon with mild pericolonic inflammatory changes. Scatter left colonic diverticula. Milder mucosal thickening of the transverse colon. Vascular/Lymphatic: Aortic atherosclerosis. No enlarged abdominal or pelvic lymph nodes. Reproductive: Status post hysterectomy. No adnexal masses. Other: No abdominal wall hernia or abnormality. No abdominopelvic ascites. Musculoskeletal: Lower lumbosacral spine fusion with intact hardware. 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 IMPRESSION: Descending and sigmoid colon colitis, either infectious or inflammatory. Milder inflammatory changes of the transverse colon. Calcific atherosclerotic disease of the aorta. Electronically Signed   By: Fidela Salisbury M.D.   On: 01/27/2019 17:41   Dg Chest 1 View  Result Date: 01/27/2019 CLINICAL DATA:  Fall yesterday. EXAM: CHEST  1 VIEW COMPARISON:  January 03, 2015 FINDINGS: The heart, hila, and mediastinum are unchanged. Irregular density projected over the right first costochondral junction. The lungs are otherwise clear. Previous vertebroplasties identified. IMPRESSION: 1. Irregular density projected over the right first costochondral junction is favored to be degenerative. A superimposed nodules not excluded. A PA and lateral chest x-ray may better evaluate. 2. No acute abnormalities noted. Electronically Signed   By: Dorise Bullion III M.D   On: 01/27/2019 14:40   Dg Thoracic Spine 2 View  Result Date: 01/27/2019 CLINICAL DATA:  Patient reports a fall yesterday when her "legs gave out." No complaint of back pain, c/o abdomen pain. EXAM: THORACIC SPINE 2 VIEWS COMPARISON:  Thoracic MRI, 04/26/2017 FINDINGS: No  acute fracture. There are fractures of T7 and T9, treated with vertebroplasty, performed since the prior MRI. The fractures are stable in severity from the prior MRI. No other fractures. No bone lesions.  No spondylolisthesis. There are mild disc degenerative changes most evident along the  mid to lower thoracic spine reflected by mild loss of disc height and small endplate osteophytes. The skeletal structures are diffusely demineralized. Soft tissues are unremarkable. IMPRESSION: 1. No acute findings. 2. Old treated fractures of T7 and T9.  No other fractures. 3. Mild degenerative changes. Electronically Signed   By: Lajean Manes M.D.   On: 01/27/2019 14:41   Ct Head Wo Contrast  Result Date: 01/27/2019 CLINICAL DATA:  Fall this morning.  Headache and abdominal pain. EXAM: CT HEAD WITHOUT CONTRAST CT CERVICAL SPINE WITHOUT CONTRAST TECHNIQUE: Multidetector CT imaging of the head and cervical spine was performed following the standard protocol without intravenous contrast. Multiplanar CT image reconstructions of the cervical spine were also generated. COMPARISON:  CT head 08/31/2009. FINDINGS: CT HEAD FINDINGS Brain: There is no evidence of acute intracranial hemorrhage, mass lesion, brain edema or extra-axial fluid collection. The ventricles and subarachnoid spaces are appropriately sized for age. There are moderate chronic small vessel ischemic changes in the periventricular and subcortical white matter which have progressed from 2011. No evidence of acute cortical stroke. Vascular:  No hyperdense vessel identified. Skull: Negative for fracture or focal lesion. Sinuses/Orbits: The visualized paranasal sinuses and mastoid air cells are clear. No orbital abnormalities are seen. Other: None. CT CERVICAL SPINE FINDINGS Alignment: Normal. Skull base and vertebrae: No evidence of acute fracture or traumatic subluxation. Soft tissues and spinal canal: No prevertebral fluid or swelling. No visible canal hematoma. Disc  levels: The disc spaces are relatively preserved. There is mild uncinate spurring. There are moderate facet degenerative changes contributing to foraminal narrowing, greatest on the right at C3-4 and on the left at C4-5. Upper chest: Probable right apical scarring. Other: Bilateral carotid atherosclerosis. IMPRESSION: 1. No acute intracranial or calvarial findings. Progressive moderate chronic small vessel ischemic changes. 2. No evidence of acute cervical spine fracture, traumatic subluxation or static signs of instability. Electronically Signed   By: Richardean Sale M.D.   On: 01/27/2019 14:26   Ct Cervical Spine Wo Contrast  Result Date: 01/27/2019 CLINICAL DATA:  Fall this morning.  Headache and abdominal pain. EXAM: CT HEAD WITHOUT CONTRAST CT CERVICAL SPINE WITHOUT CONTRAST TECHNIQUE: Multidetector CT imaging of the head and cervical spine was performed following the standard protocol without intravenous contrast. Multiplanar CT image reconstructions of the cervical spine were also generated. COMPARISON:  CT head 08/31/2009. FINDINGS: CT HEAD FINDINGS Brain: There is no evidence of acute intracranial hemorrhage, mass lesion, brain edema or extra-axial fluid collection. The ventricles and subarachnoid spaces are appropriately sized for age. There are moderate chronic small vessel ischemic changes in the periventricular and subcortical white matter which have progressed from 2011. No evidence of acute cortical stroke. Vascular:  No hyperdense vessel identified. Skull: Negative for fracture or focal lesion. Sinuses/Orbits: The visualized paranasal sinuses and mastoid air cells are clear. No orbital abnormalities are seen. Other: None. CT CERVICAL SPINE FINDINGS Alignment: Normal. Skull base and vertebrae: No evidence of acute fracture or traumatic subluxation. Soft tissues and spinal canal: No prevertebral fluid or swelling. No visible canal hematoma. Disc levels: The disc spaces are relatively preserved.  There is mild uncinate spurring. There are moderate facet degenerative changes contributing to foraminal narrowing, greatest on the right at C3-4 and on the left at C4-5. Upper chest: Probable right apical scarring. Other: Bilateral carotid atherosclerosis. IMPRESSION: 1. No acute intracranial or calvarial findings. Progressive moderate chronic small vessel ischemic changes. 2. No evidence of acute cervical spine fracture, traumatic subluxation or static signs of instability.  Electronically Signed   By: Richardean Sale M.D.   On: 01/27/2019 14:26    EKG: Independently reviewed.   Active Problems:   * No active hospital problems. *   Assessment/Plan Likely flare up of Crohn's disease: Admit patient for further assessment management Change oral prednisone to IV Solu-Medrol ER provider has already discussed with Dr. Carlean Purl, patient's primary GI Further management depend on hospital course  Hypokalemia: Patient was 2.7 on presentation ER has administered potassium with repeat potassium level being 3.8 We will discontinue HCTZ Continue to monitor and replete  Hypophosphatemia: Replete.  Hypertension, essential: DC HCTZ Continue to monitor and optimize.  DVT prophylaxis: SCD Code Status: Full code Family Communication:  Disposition Plan: Home eventually Consults called: ER Team has consulted GI, Dr. Carlean Purl   Admission status: Inpatient  Time spent: 45 minutes  Dana Allan, MD  [facility] Pager #: [phone] 7PM-7AM contact night coverage as above  01/27/2019, 9:55 PM

## 2019-01-27 NOTE — ED Notes (Signed)
Purwic placed on pt. Nurses aware.

## 2019-01-27 NOTE — Telephone Encounter (Signed)
Patient has gone to ED so Dr Carlean Purl said we won't place any orders at this time.

## 2019-01-27 NOTE — ED Triage Notes (Signed)
Patient reports a fall yesterday when her "legs gave out." Patient states she did not hit her head or have LOC. Patient states she just went down easy. Patient c/o lower abdominal pain x 4-5 days. Patient states she has a history of Crohn's disease.

## 2019-01-27 NOTE — ED Notes (Signed)
ED TO INPATIENT HANDOFF REPORT  Name/Age/Gender [name] [name] 78 y.o. female  Code Status Code Status History    Date Active Date Inactive Code Status Order ID Comments User Context   06/06/2011 0100 06/13/2011 1858 Full Code [id number]  Colin Broach, RN Inpatient   Advance Care Planning Activity      Home/SNF/Other Home  Chief Complaint Weakness, Fall, AMS  Level of Care/Admitting Diagnosis ED Disposition    ED Disposition Condition [facility] Area: [facility] [[id number]]  Level of Care: Telemetry [5]  Admit to tele based on following criteria: Monitor QTC interval  Covid Evaluation: Confirmed COVID Negative  Diagnosis: Colitis [[id number]]  Admitting Physician: Shirlean Mylar  Attending Physician: Dana Allan I [[id number]]  Estimated length of stay: past midnight tomorrow  Certification:: I certify this patient will need inpatient services for at least 2 midnights  PT Class (Do Not Modify): Inpatient [101]  PT Acc Code (Do Not Modify): Private [1]       Medical History Past Medical History:  Diagnosis Date  . Allergy    year around allergies   . Arthritis    shoulder  . Bell's palsy   . Blood transfusion    1966  . Blood transfusion without reported diagnosis   . Cataract    bilaterally removed  . Colon polyps    adenomatous polyp June 2012  . Complication of anesthesia    slow to wake  . Crohn's colitis ([place])   . Dry eyes    left eye worst one  . GERD (gastroesophageal reflux disease)   . Headache(784.0)    hx migraines  . Hypertension   . Shortness of breath     Allergies Allergies  Allergen Reactions  . Azathioprine Nausea Only    Elevated lipase also - ? Pancreatitis   . Entyvio [Vedolizumab] Shortness Of Breath  . Adhesive [Tape] Rash    Taking skin off  . Sulfa Antibiotics Itching and Rash    IV Location/Drains/Wounds Patient Lines/Drains/Airways Status   Active Line/Drains/Airways     Name:   Placement date:   Placement time:   Site:   Days:   Peripheral IV 01/27/19 Left Antecubital   01/27/19    1150    Antecubital   less than 1          Labs/Imaging Results for orders placed or performed during the hospital encounter of 01/27/19 (from the past 48 hour(s))  Lipase, blood     Status: None   Collection Time: 01/27/19 11:51 AM  Result Value Ref Range   Lipase 46 11 - 51 U/L    Comment: Performed at [facility], [place] [address] [place], [place] [address]  Comprehensive metabolic panel     Status: Abnormal   Collection Time: 01/27/19 11:51 AM  Result Value Ref Range   Sodium 137 135 - 145 mmol/L   Potassium 2.7 (LL) 3.5 - 5.1 mmol/L    Comment: CRITICAL RESULT CALLED TO, READ BACK BY AND VERIFIED WITH: BINGHAM,S. RN AT 1247 01/27/19 MULLINS,T    Chloride 104 98 - 111 mmol/L   CO2 24 22 - 32 mmol/L   Glucose, Bld 100 (H) 70 - 99 mg/dL   BUN 38 (H) 8 - 23 mg/dL   Creatinine, Ser 1.86 (H) 0.44 - 1.00 mg/dL   Calcium 8.3 (L) 8.9 - 10.3 mg/dL   Total Protein 7.2 6.5 - 8.1 g/dL   Albumin 2.7 (L)  3.5 - 5.0 g/dL   AST 27 15 - 41 U/L   ALT 19 0 - 44 U/L   Alkaline Phosphatase 359 (H) 38 - 126 U/L   Total Bilirubin 0.7 0.3 - 1.2 mg/dL   GFR calc non Af Amer 26 (L) >60 mL/min   GFR calc Af Amer 30 (L) >60 mL/min   Anion gap 9 5 - 15    Comment: Performed at [facility], [place] [address], [place], [place] [address]  CBC     Status: Abnormal   Collection Time: 01/27/19 11:51 AM  Result Value Ref Range   WBC 16.1 (H) 4.0 - 10.5 K/uL   RBC 4.46 3.87 - 5.11 MIL/uL   Hemoglobin 13.9 12.0 - 15.0 g/dL   HCT 42.5 36.0 - 46.0 %   MCV 95.3 80.0 - 100.0 fL   MCH 31.2 26.0 - 34.0 pg   MCHC 32.7 30.0 - 36.0 g/dL   RDW 13.4 11.5 - 15.5 %   Platelets 476 (H) 150 - 400 K/uL   nRBC 0.0 0.0 - 0.2 %    Comment: Performed at [facility], [place] [address], [place], [place] [address]  Magnesium     Status: None   Collection  Time: 01/27/19 11:51 AM  Result Value Ref Range   Magnesium 2.1 1.7 - 2.4 mg/dL    Comment: Performed at [facility], [place] [address] [place], [place] [address]  SARS Coronavirus 2 (CEPHEID - Performed in [place] hospital lab), Hosp Order     Status: None   Collection Time: 01/27/19 12:26 PM   Specimen: Nasopharyngeal Swab  Result Value Ref Range   SARS Coronavirus 2 NEGATIVE NEGATIVE    Comment: (NOTE) If result is NEGATIVE SARS-CoV-2 target nucleic acids are NOT DETECTED. The SARS-CoV-2 RNA is generally detectable in upper and lower  respiratory specimens during the acute phase of infection. The lowest  concentration of SARS-CoV-2 viral copies this assay can detect is 250  copies / mL. A negative result does not preclude SARS-CoV-2 infection  and should not be used as the sole basis for treatment or other  patient management decisions.  A negative result may occur with  improper specimen collection / handling, submission of specimen other  than nasopharyngeal swab, presence of viral mutation(s) within the  areas targeted by this assay, and inadequate number of viral copies  (<250 copies / mL). A negative result must be combined with clinical  observations, patient history, and epidemiological information. If result is POSITIVE SARS-CoV-2 target nucleic acids are DETECTED. The SARS-CoV-2 RNA is generally detectable in upper and lower  respiratory specimens dur ing the acute phase of infection.  Positive  results are indicative of active infection with SARS-CoV-2.  Clinical  correlation with patient history and other diagnostic information is  necessary to determine patient infection status.  Positive results do  not rule out bacterial infection or co-infection with other viruses. If result is PRESUMPTIVE POSTIVE SARS-CoV-2 nucleic acids MAY BE PRESENT.   A presumptive positive result was obtained on the submitted specimen  and confirmed on repeat testing.   While 2019 novel coronavirus  (SARS-CoV-2) nucleic acids may be present in the submitted sample  additional confirmatory testing may be necessary for epidemiological  and / or clinical management purposes  to differentiate between  SARS-CoV-2 and other Sarbecovirus currently known to infect humans.  If clinically indicated additional testing with an alternate test  methodology [id number]) is advised. The SARS-CoV-2  RNA is generally  detectable in upper and lower respiratory sp ecimens during the acute  phase of infection. The expected result is Negative. Fact Sheet for Patients:  StrictlyIdeas.no Fact Sheet for Healthcare Providers: BankingDealers.co.za This test is not yet approved or cleared by the [place] FDA and has been authorized for detection and/or diagnosis of SARS-CoV-2 by FDA under an Emergency Use Authorization (EUA).  This EUA will remain in effect (meaning this test can be used) for the duration of the COVID-19 declaration under Section 564(b)(1) of the Act, 21 U.S.C. section 360bbb-3(b)(1), unless the authorization is terminated or revoked sooner. Performed at [facility], [place] [address] [place], [place] [address]   Lactic acid, plasma     Status: None   Collection Time: 01/27/19 12:54 PM  Result Value Ref Range   Lactic Acid, Venous 1.3 0.5 - 1.9 mmol/L    Comment: Performed at [facility], [place] [address], [place], [place] [address]  Urinalysis, Routine w reflex microscopic     Status: Abnormal   Collection Time: 01/27/19  5:03 PM  Result Value Ref Range   Color, Urine YELLOW YELLOW   APPearance CLEAR CLEAR   Specific Gravity, Urine 1.012 1.005 - 1.030   pH 6.0 5.0 - 8.0   Glucose, UA NEGATIVE NEGATIVE mg/dL   Hgb urine dipstick LARGE (A) NEGATIVE   Bilirubin Urine NEGATIVE NEGATIVE   Ketones, ur NEGATIVE NEGATIVE mg/dL   Protein, ur NEGATIVE NEGATIVE mg/dL   Nitrite  NEGATIVE NEGATIVE   Leukocytes,Ua NEGATIVE NEGATIVE   RBC / HPF 11-20 0 - 5 RBC/hpf   WBC, UA 0-5 0 - 5 WBC/hpf   Bacteria, UA NONE SEEN NONE SEEN   Squamous Epithelial / LPF 0-5 0 - 5    Comment: Performed at [facility], [place] [address], [place], [place] [address]  Comprehensive metabolic panel     Status: Abnormal   Collection Time: 01/27/19  6:36 PM  Result Value Ref Range   Sodium 135 135 - 145 mmol/L   Potassium 3.8 3.5 - 5.1 mmol/L    Comment: DELTA CHECK NOTED REPEATED TO VERIFY NO VISIBLE HEMOLYSIS    Chloride 107 98 - 111 mmol/L   CO2 20 (L) 22 - 32 mmol/L   Glucose, Bld 70 70 - 99 mg/dL   BUN 30 (H) 8 - 23 mg/dL   Creatinine, Ser 1.36 (H) 0.44 - 1.00 mg/dL   Calcium 7.5 (L) 8.9 - 10.3 mg/dL   Total Protein 6.0 (L) 6.5 - 8.1 g/dL   Albumin 2.4 (L) 3.5 - 5.0 g/dL   AST 30 15 - 41 U/L   ALT 16 0 - 44 U/L   Alkaline Phosphatase 376 (H) 38 - 126 U/L   Total Bilirubin 0.3 0.3 - 1.2 mg/dL   GFR calc non Af Amer 37 (L) >60 mL/min   GFR calc Af Amer 43 (L) >60 mL/min   Anion gap 8 5 - 15    Comment: Performed at [facility], [place] [address], [place], [place] [address]  Magnesium     Status: Abnormal   Collection Time: 01/27/19  6:36 PM  Result Value Ref Range   Magnesium 2.6 (H) 1.7 - 2.4 mg/dL    Comment: Performed at [facility], [place] [address] [place], [place] [address]  Phosphorus     Status: Abnormal   Collection Time: 01/27/19  6:36 PM  Result Value Ref Range   Phosphorus 1.5 (L)  2.5 - 4.6 mg/dL    Comment: Performed at [facility], [place] [address], [place], [place] [address]   Ct Abdomen Pelvis Wo Contrast  Result Date: 01/27/2019 CLINICAL DATA:  Lower abdominal pain for 4-5 days. EXAM: CT ABDOMEN AND PELVIS WITHOUT CONTRAST TECHNIQUE: Multidetector CT imaging of the abdomen and pelvis was performed following the standard protocol without IV contrast. COMPARISON:  None. FINDINGS: Lower  chest: No acute abnormality. Calcific atherosclerotic disease of the coronary arteries and aorta. Hepatobiliary: No focal liver abnormality is seen. Status post cholecystectomy. No biliary dilatation. Pancreas: Unremarkable. No pancreatic ductal dilatation or surrounding inflammatory changes. Spleen: Normal in size without focal abnormality. Adrenals/Urinary Tract: Adrenal glands are unremarkable. Kidneys are normal, without renal calculi, focal lesion, or hydronephrosis. Bladder is unremarkable. Stomach/Bowel: Stomach is within normal limits. Appendix appears normal. No evidence of small bowel wall thickening, distention, or inflammatory changes. Circumferential mucosal thickening of the descending and sigmoid colon with mild pericolonic inflammatory changes. Scatter left colonic diverticula. Milder mucosal thickening of the transverse colon. Vascular/Lymphatic: Aortic atherosclerosis. No enlarged abdominal or pelvic lymph nodes. Reproductive: Status post hysterectomy. No adnexal masses. Other: No abdominal wall hernia or abnormality. No abdominopelvic ascites. Musculoskeletal: Lower lumbosacral spine fusion with intact hardware. 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 IMPRESSION: Descending and sigmoid colon colitis, either infectious or inflammatory. Milder inflammatory changes of the transverse colon. Calcific atherosclerotic disease of the aorta. Electronically Signed   By: Fidela Salisbury M.D.   On: 01/27/2019 17:41   Dg Chest 1 View  Result Date: 01/27/2019 CLINICAL DATA:  Fall yesterday. EXAM: CHEST  1 VIEW COMPARISON:  January 03, 2015 FINDINGS: The heart, hila, and mediastinum are unchanged. Irregular density projected over the right first costochondral junction. The lungs are otherwise clear. Previous vertebroplasties identified. IMPRESSION: 1. Irregular density projected over the right first costochondral junction is favored to be degenerative. A superimposed nodules not excluded. A  PA and lateral chest x-ray may better evaluate. 2. No acute abnormalities noted. Electronically Signed   By: Dorise Bullion III M.D   On: 01/27/2019 14:40   Dg Thoracic Spine 2 View  Result Date: 01/27/2019 CLINICAL DATA:  Patient reports a fall yesterday when her "legs gave out." No complaint of back pain, c/o abdomen pain. EXAM: THORACIC SPINE 2 VIEWS COMPARISON:  Thoracic MRI, 04/26/2017 FINDINGS: No acute fracture. There are fractures of T7 and T9, treated with vertebroplasty, performed since the prior MRI. The fractures are stable in severity from the prior MRI. No other fractures. No bone lesions.  No spondylolisthesis. There are mild disc degenerative changes most evident along the mid to lower thoracic spine reflected by mild loss of disc height and small endplate osteophytes. The skeletal structures are diffusely demineralized. Soft tissues are unremarkable. IMPRESSION: 1. No acute findings. 2. Old treated fractures of T7 and T9.  No other fractures. 3. Mild degenerative changes. Electronically Signed   By: Lajean Manes M.D.   On: 01/27/2019 14:41   Ct Head Wo Contrast  Result Date: 01/27/2019 CLINICAL DATA:  Fall this morning.  Headache and abdominal pain. EXAM: CT HEAD WITHOUT CONTRAST CT CERVICAL SPINE WITHOUT CONTRAST TECHNIQUE: Multidetector CT imaging of the head and cervical spine was performed following the standard protocol without intravenous contrast. Multiplanar CT image reconstructions of the cervical spine were also generated. COMPARISON:  CT head 08/31/2009. FINDINGS: CT HEAD FINDINGS Brain: There is no evidence of acute intracranial hemorrhage, mass lesion, brain edema or extra-axial fluid collection. The ventricles and  subarachnoid spaces are appropriately sized for age. There are moderate chronic small vessel ischemic changes in the periventricular and subcortical white matter which have progressed from 2011. No evidence of acute cortical stroke. Vascular:  No hyperdense vessel  identified. Skull: Negative for fracture or focal lesion. Sinuses/Orbits: The visualized paranasal sinuses and mastoid air cells are clear. No orbital abnormalities are seen. Other: None. CT CERVICAL SPINE FINDINGS Alignment: Normal. Skull base and vertebrae: No evidence of acute fracture or traumatic subluxation. Soft tissues and spinal canal: No prevertebral fluid or swelling. No visible canal hematoma. Disc levels: The disc spaces are relatively preserved. There is mild uncinate spurring. There are moderate facet degenerative changes contributing to foraminal narrowing, greatest on the right at C3-4 and on the left at C4-5. Upper chest: Probable right apical scarring. Other: Bilateral carotid atherosclerosis. IMPRESSION: 1. No acute intracranial or calvarial findings. Progressive moderate chronic small vessel ischemic changes. 2. No evidence of acute cervical spine fracture, traumatic subluxation or static signs of instability. Electronically Signed   By: Richardean Sale M.D.   On: 01/27/2019 14:26   Ct Cervical Spine Wo Contrast  Result Date: 01/27/2019 CLINICAL DATA:  Fall this morning.  Headache and abdominal pain. EXAM: CT HEAD WITHOUT CONTRAST CT CERVICAL SPINE WITHOUT CONTRAST TECHNIQUE: Multidetector CT imaging of the head and cervical spine was performed following the standard protocol without intravenous contrast. Multiplanar CT image reconstructions of the cervical spine were also generated. COMPARISON:  CT head 08/31/2009. FINDINGS: CT HEAD FINDINGS Brain: There is no evidence of acute intracranial hemorrhage, mass lesion, brain edema or extra-axial fluid collection. The ventricles and subarachnoid spaces are appropriately sized for age. There are moderate chronic small vessel ischemic changes in the periventricular and subcortical white matter which have progressed from 2011. No evidence of acute cortical stroke. Vascular:  No hyperdense vessel identified. Skull: Negative for fracture or focal  lesion. Sinuses/Orbits: The visualized paranasal sinuses and mastoid air cells are clear. No orbital abnormalities are seen. Other: None. CT CERVICAL SPINE FINDINGS Alignment: Normal. Skull base and vertebrae: No evidence of acute fracture or traumatic subluxation. Soft tissues and spinal canal: No prevertebral fluid or swelling. No visible canal hematoma. Disc levels: The disc spaces are relatively preserved. There is mild uncinate spurring. There are moderate facet degenerative changes contributing to foraminal narrowing, greatest on the right at C3-4 and on the left at C4-5. Upper chest: Probable right apical scarring. Other: Bilateral carotid atherosclerosis. IMPRESSION: 1. No acute intracranial or calvarial findings. Progressive moderate chronic small vessel ischemic changes. 2. No evidence of acute cervical spine fracture, traumatic subluxation or static signs of instability. Electronically Signed   By: Richardean Sale M.D.   On: 01/27/2019 14:26    Pending Labs Unresulted Labs (From admission, onward)    Start     Ordered   01/27/19 1658  Urine culture  ONCE - STAT,   STAT     01/27/19 1657   Signed and Held  Protime-INR  Once,   R     Signed and Held   Signed and Held  Urinalysis, Complete w Microscopic  Once,   R     Signed and Held   Visual merchandiser and Occupational hygienist morning,   R     Signed and Held   Signed and Held  CBC  Tomorrow morning,   R     Signed and Held   Signed and Held  Phosphorus  Tomorrow morning,   R  Signed and Held          Vitals/Pain Today's Vitals   01/27/19 2030 01/27/19 2100 01/27/19 2130 01/27/19 2200  BP: 137/88 126/70 104/78 113/65  Pulse: 90 84 79 78  Resp: (!) 21 15 (!) 22 (!) 0  Temp:      TempSrc:      SpO2: 97% 95% 94% 93%  Weight:      Height:      PainSc:        Isolation Precautions No active isolations  Medications Medications  sodium chloride 0.9 % bolus 1,000 mL (0 mLs Intravenous Stopped 01/27/19 1504)   ondansetron (ZOFRAN) injection 4 mg (4 mg Intravenous Given 01/27/19 1242)  potassium chloride SA (K-DUR) CR tablet 40 mEq (40 mEq Oral Given 01/27/19 1308)  potassium chloride 10 mEq in 100 mL IVPB (0 mEq Intravenous Stopped 01/27/19 1705)  magnesium sulfate IVPB 2 g 50 mL (0 g Intravenous Stopped 01/27/19 1504)  iohexol (OMNIPAQUE) 300 MG/ML solution 30 mL (30 mLs Oral Contrast Given 01/27/19 1424)  acetaminophen (TYLENOL) tablet 650 mg (650 mg Oral Given 01/27/19 2022)    Mobility walks with person assist

## 2019-01-27 NOTE — Telephone Encounter (Signed)
Informed daughter in-law of MD response and reminded of Dr. Carlean Purl message. States understanding. Will get patient to ED as soon as possible.

## 2019-01-27 NOTE — ED Provider Notes (Signed)
[facility] DEPT Provider Note   CSN: [id number] Arrival date & time: 01/27/19  1043    History   Chief Complaint Chief Complaint  Patient presents with  . Fall  . Fatigue  . Abdominal Pain    HPI Archbald is a 78 y.o. female.     HPI   Pt is a 78 y/o female with a h/o Crohn's disease, GERD, headaches, hypertension, who presents to the ED today for eval of multiple complaints abd pain and fall. Pain located to lower abd. Pain feels aching and sharp. Rates pain 2/10. Pain States pain has been intermittent for the last 2 weeks but started getting worse 2 days ago. She also reports nausea, diarrhea, and bloody stools. Blood is red.  Denies vomiting, dysuria, or fevers. States she called her GI doc, Dr. Carlean Purl, about her sxs. She is on chronic prednisone 72m BID.    She also states that she fell yesterday. States she was walking and her legs gave out. States this happens to her frequently. She landed on her buttocks. Denies syncope. She does not remember if she hit her head. Denies headaches, vision changes, denies persistent unilateral weakness/numbness. Denies loss of control of bowel or bladder. Reports chronic back pain that is worse since the fall. She denies other injuries. She has been ambulatory since the fall.   Patient states that she felt somewhat confused recently.  Obtain additional history from the patient's son and daughter-in-law who agree that patient has been somewhat confused over the past several days.  They state that this happened in the past when she had a bad Crohn's flare.  Past Medical History:  Diagnosis Date  . Allergy    year around allergies   . Arthritis    shoulder  . Bell's palsy   . Blood transfusion    1966  . Blood transfusion without reported diagnosis   . Cataract    bilaterally removed  . Colon polyps    adenomatous polyp June 2012  . Complication of anesthesia    slow to wake  . Crohn's colitis  ([facility]   . Dry eyes    left eye worst one  . GERD (gastroesophageal reflux disease)   . Headache(784.0)    hx migraines  . Hypertension   . Shortness of breath     Patient Active Problem List   Diagnosis Date Noted  . Alkaline phosphatase elevation 11/19/2018  . Venous insufficiency 01/28/2018  . Routine general medical examination at a health care facility 01/10/2018  . Iron deficiency anemia due to chronic blood loss 12/18/2017  . Elevated platelet count 12/11/2017  . Memory change 11/01/2017  . Lumbar radiculopathy 12/31/2016  . Neck pain 03/05/2016  . Cervical paraspinal muscle spasm 03/01/2016  . H/O Bell's palsy 01/09/2016  . 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 Cough variant asthma vs uacs vs bronchiectasis 01/14/2015  . Hyperglycemia 10/11/2014  . Esophageal reflux 09/22/2014  . Long term (current) use of systemic steroids-prednisone 08/25/2012  . 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 Immunosuppression - on chronic prednisone, hx of Remicade and azathioprine 07/09/2011  . [place] disease of [place] with rectal bleeding [place]   . Hypertension   . Arthritis     Past Surgical History:  Procedure Laterality Date  . ABDOMINAL HYSTERECTOMY    . APPENDECTOMY    . CATARACT EXTRACTION Bilateral    Separate dates  . CHOLECYSTECTOMY    . COLONOSCOPY  multiple  . flex sigmoidoscopy with  biospy  01/17/12  . FLEXIBLE SIGMOIDOSCOPY  06/08/2011   severe Crohn's colitis to descending colon at least  . FOOT GANGLION EXCISION     left  . SHOULDER SURGERY     right  . TONSILLECTOMY       OB History   No obstetric history on file.      Home Medications    Prior to Admission medications   Medication Sig Start Date End Date Taking? Authorizing Provider  acetaminophen (TYLENOL) 325 MG tablet Take 650 mg by mouth every 6 (six) hours as needed for moderate pain.   Yes [provider]  dicyclomine (BENTYL) 20 MG tablet Take 1 tablet (20 mg total) by mouth 3  (three) times daily as needed for spasms (abdominal pain). 12/18/18  Yes [name], MD  hydrochlorothiazide (HYDRODIURIL) 25 MG tablet Take 1 tablet (25 mg total) by mouth daily. Need office visit for further refills 01/27/18  Yes Hoyt Koch, MD  Multiple Vitamin (MULTIVITAMIN WITH MINERALS) TABS tablet Take 1 tablet by mouth daily.   Yes [provider]  predniSONE (DELTASONE) 10 MG tablet Take 20 mg by mouth 2 (two) times daily with a meal.   Yes [provider]    Family History Family History  Problem Relation Age of Onset  . Leukemia Mother   . Heart disease Mother   . Heart disease Father   . Heart attack Father   . Ovarian cancer Paternal Aunt        1/2 aunt  . Breast cancer Paternal Aunt        1/2 aunt  . Throat cancer Paternal Aunt        1/2 aunt  . Parkinsonism Brother   . Heart disease Brother   . Leukemia Maternal Grandfather   . Anesthesia problems Neg Hx   . Hypotension Neg Hx   . Malignant hyperthermia Neg Hx   . Pseudochol deficiency Neg Hx   . Colon cancer Neg Hx   . Diabetes Neg Hx   . Esophageal cancer Neg Hx   . Rectal cancer Neg Hx   . Stomach cancer Neg Hx     Social History Social History   Tobacco Use  . Smoking status: Former Smoker    Packs/day: 0.25    Years: 10.00    Pack years: 2.50    Types: Cigarettes    Quit date: 09/26/1968    Years since quitting: 50.3  . Smokeless tobacco: Never Used  Substance Use Topics  . Alcohol use: Yes    Alcohol/week: 0.0 standard drinks    Comment: rare wine  . Drug use: No     Allergies   Azathioprine, Entyvio [vedolizumab], Adhesive [tape], and Sulfa antibiotics   Review of Systems Review of Systems  Constitutional: Negative for fever.  HENT: Negative for ear pain and sore throat.   Eyes: Negative for visual disturbance.  Respiratory: Negative for cough and shortness of breath.   Cardiovascular: Negative for chest pain.  Gastrointestinal: Positive for  abdominal pain, blood in stool, diarrhea and nausea. Negative for constipation and vomiting.  Genitourinary: Negative for dysuria and hematuria.  Musculoskeletal: Positive for back pain.  Skin: Negative for color change.  Neurological: Positive for weakness (generalized). Negative for numbness and headaches.       Confusion  All other systems reviewed and are negative.   Physical Exam Updated Vital Signs BP (!) 150/95   Pulse 87   Temp 98.9 F (37.2 C) (Rectal)  Resp (!) 21   Ht 5' 6"  (1.676 m)   Wt 79.8 kg   SpO2 100%   BMI 28.41 kg/m   Physical Exam Vitals signs and nursing note reviewed.  Constitutional:      General: She is not in acute distress.    Appearance: She is well-developed. She is obese.  HENT:     Head: Normocephalic and atraumatic.  Eyes:     Conjunctiva/sclera: Conjunctivae normal.  Neck:     Musculoskeletal: Neck supple.  Cardiovascular:     Rate and Rhythm: Normal rate and regular rhythm.     Heart sounds: No murmur.  Pulmonary:     Effort: Pulmonary effort is normal. No respiratory distress.     Breath sounds: Normal breath sounds.  Abdominal:     General: Bowel sounds are normal.     Palpations: Abdomen is soft.     Tenderness: There is abdominal tenderness (mild generalized ttp, worse to bilat lower abdomen). There is no guarding or rebound.  Musculoskeletal:     Comments: No tenderness to lumbar spine.  Does have some cervical and mid thoracic spine tenderness.  Skin:    General: Skin is warm and dry.  Neurological:     Mental Status: She is alert.     Comments: Mental Status:  Alert, thought content appropriate, able to give a coherent history. Speech fluent without evidence of aphasia. Able to follow 2 step commands without difficulty.  Cranial Nerves:  II:  pupils equal, round, reactive to light III,IV, VI: ptosis not present, extra-ocular motions intact bilaterally  V,VII: smile symmetric, facial light touch sensation equal VIII:  hearing grossly normal to voice  X: uvula elevates symmetrically  XI: bilateral shoulder shrug symmetric and strong XII: midline tongue extension without fassiculations Motor:  Normal tone. 5/5 strength of BUE and BLE major muscle groups including strong and equal grip strength and dorsiflexion/plantar flexion Sensory: light touch normal in all extremities. CV: 2+ radial and DP pulses      ED Treatments / Results  Labs (all labs ordered are listed, but only abnormal results are displayed) Labs Reviewed  COMPREHENSIVE METABOLIC PANEL - Abnormal; Notable for the following components:      Result Value   Potassium 2.7 (*)    Glucose, Bld 100 (*)    BUN 38 (*)    Creatinine, Ser 1.86 (*)    Calcium 8.3 (*)    Albumin 2.7 (*)    Alkaline Phosphatase 359 (*)    GFR calc non Af Amer 26 (*)    GFR calc Af Amer 30 (*)    All other components within normal limits  CBC - Abnormal; Notable for the following components:   WBC 16.1 (*)    Platelets 476 (*)    All other components within normal limits  SARS CORONAVIRUS 2 (HOSPITAL ORDER, [facility])  URINE CULTURE  LIPASE, BLOOD  LACTIC ACID, PLASMA  MAGNESIUM  URINALYSIS, ROUTINE W REFLEX MICROSCOPIC    EKG EKG Interpretation  Date/Time:  Tuesday January 27 2019 12:39:36 EDT Ventricular Rate:  88 PR Interval:    QRS Duration: 97 QT Interval:  384 QTC Calculation: 465 R Axis:   -21 Text Interpretation:  Sinus rhythm Borderline left axis deviation Low voltage, precordial leads Consider anterior infarct Minimal ST depression, lateral leads Confirmed by Quintella Reichert [phone]) on 01/27/2019 4:07:53 PM   Radiology Dg Chest 1 View  Result Date: 01/27/2019 CLINICAL DATA:  Fall yesterday. EXAM: CHEST  1 VIEW COMPARISON:  January 03, 2015 FINDINGS: The heart, hila, and mediastinum are unchanged. Irregular density projected over the right first costochondral junction. The lungs are otherwise clear. Previous  vertebroplasties identified. IMPRESSION: 1. Irregular density projected over the right first costochondral junction is favored to be degenerative. A superimposed nodules not excluded. A PA and lateral chest x-ray may better evaluate. 2. No acute abnormalities noted. Electronically Signed   By: Dorise Bullion III M.D   On: 01/27/2019 14:40   Dg Thoracic Spine 2 View  Result Date: 01/27/2019 CLINICAL DATA:  Patient reports a fall yesterday when her "legs gave out." No complaint of back pain, c/o abdomen pain. EXAM: THORACIC SPINE 2 VIEWS COMPARISON:  Thoracic MRI, 04/26/2017 FINDINGS: No acute fracture. There are fractures of T7 and T9, treated with vertebroplasty, performed since the prior MRI. The fractures are stable in severity from the prior MRI. No other fractures. No bone lesions.  No spondylolisthesis. There are mild disc degenerative changes most evident along the mid to lower thoracic spine reflected by mild loss of disc height and small endplate osteophytes. The skeletal structures are diffusely demineralized. Soft tissues are unremarkable. IMPRESSION: 1. No acute findings. 2. Old treated fractures of T7 and T9.  No other fractures. 3. Mild degenerative changes. Electronically Signed   By: Lajean Manes M.D.   On: 01/27/2019 14:41   Ct Head Wo Contrast  Result Date: 01/27/2019 CLINICAL DATA:  Fall this morning.  Headache and abdominal pain. EXAM: CT HEAD WITHOUT CONTRAST CT CERVICAL SPINE WITHOUT CONTRAST TECHNIQUE: Multidetector CT imaging of the head and cervical spine was performed following the standard protocol without intravenous contrast. Multiplanar CT image reconstructions of the cervical spine were also generated. COMPARISON:  CT head 08/31/2009. FINDINGS: CT HEAD FINDINGS Brain: There is no evidence of acute intracranial hemorrhage, mass lesion, brain edema or extra-axial fluid collection. The ventricles and subarachnoid spaces are appropriately sized for age. There are moderate chronic  small vessel ischemic changes in the periventricular and subcortical white matter which have progressed from 2011. No evidence of acute cortical stroke. Vascular:  No hyperdense vessel identified. Skull: Negative for fracture or focal lesion. Sinuses/Orbits: The visualized paranasal sinuses and mastoid air cells are clear. No orbital abnormalities are seen. Other: None. CT CERVICAL SPINE FINDINGS Alignment: Normal. Skull base and vertebrae: No evidence of acute fracture or traumatic subluxation. Soft tissues and spinal canal: No prevertebral fluid or swelling. No visible canal hematoma. Disc levels: The disc spaces are relatively preserved. There is mild uncinate spurring. There are moderate facet degenerative changes contributing to foraminal narrowing, greatest on the right at C3-4 and on the left at C4-5. Upper chest: Probable right apical scarring. Other: Bilateral carotid atherosclerosis. IMPRESSION: 1. No acute intracranial or calvarial findings. Progressive moderate chronic small vessel ischemic changes. 2. No evidence of acute cervical spine fracture, traumatic subluxation or static signs of instability. Electronically Signed   By: Richardean Sale M.D.   On: 01/27/2019 14:26   Ct Cervical Spine Wo Contrast  Result Date: 01/27/2019 CLINICAL DATA:  Fall this morning.  Headache and abdominal pain. EXAM: CT HEAD WITHOUT CONTRAST CT CERVICAL SPINE WITHOUT CONTRAST TECHNIQUE: Multidetector CT imaging of the head and cervical spine was performed following the standard protocol without intravenous contrast. Multiplanar CT image reconstructions of the cervical spine were also generated. COMPARISON:  CT head 08/31/2009. FINDINGS: CT HEAD FINDINGS Brain: There is no evidence of acute intracranial hemorrhage, mass lesion, brain edema or extra-axial fluid collection. The ventricles  and subarachnoid spaces are appropriately sized for age. There are moderate chronic small vessel ischemic changes in the periventricular  and subcortical white matter which have progressed from 2011. No evidence of acute cortical stroke. Vascular:  No hyperdense vessel identified. Skull: Negative for fracture or focal lesion. Sinuses/Orbits: The visualized paranasal sinuses and mastoid air cells are clear. No orbital abnormalities are seen. Other: None. CT CERVICAL SPINE FINDINGS Alignment: Normal. Skull base and vertebrae: No evidence of acute fracture or traumatic subluxation. Soft tissues and spinal canal: No prevertebral fluid or swelling. No visible canal hematoma. Disc levels: The disc spaces are relatively preserved. There is mild uncinate spurring. There are moderate facet degenerative changes contributing to foraminal narrowing, greatest on the right at C3-4 and on the left at C4-5. Upper chest: Probable right apical scarring. Other: Bilateral carotid atherosclerosis. IMPRESSION: 1. No acute intracranial or calvarial findings. Progressive moderate chronic small vessel ischemic changes. 2. No evidence of acute cervical spine fracture, traumatic subluxation or static signs of instability. Electronically Signed   By: Richardean Sale M.D.   On: 01/27/2019 14:26    Procedures Procedures (including critical care time)  Medications Ordered in ED Medications  sodium chloride 0.9 % bolus 1,000 mL (0 mLs Intravenous Stopped 01/27/19 1504)  ondansetron (ZOFRAN) injection 4 mg (4 mg Intravenous Given 01/27/19 1242)  potassium chloride SA (K-DUR) CR tablet 40 mEq (40 mEq Oral Given 01/27/19 1308)  potassium chloride 10 mEq in 100 mL IVPB (0 mEq Intravenous Stopped 01/27/19 1705)  magnesium sulfate IVPB 2 g 50 mL (0 g Intravenous Stopped 01/27/19 1504)  iohexol (OMNIPAQUE) 300 MG/ML solution 30 mL (30 mLs Oral Contrast Given 01/27/19 1424)     Initial Impression / Assessment and Plan / ED Course  I have reviewed the triage vital signs and the nursing notes.  Pertinent labs & imaging results that were available during my care of the patient were  reviewed by me and considered in my medical decision making (see chart for details).     Final Clinical Impressions(s) / ED Diagnoses   Final diagnoses:  Pain   78 year old female presents emergency department today for evaluation of abdominal pain, generalized weakness and falls.  Abdominal pain and bloody diarrhea ongoing for the last 2 weeks.  History of Crohn's disease.  Generalized weakness and falls have been present for the last few days.  Family states patient has been confused for several days.  She arrived to the ED tachycardic, but afebrile.  She had one episode of hypotension however on my initial eval patient's blood pressure was normal.  Abdomen with normoactive bowel sounds.  Does have some generalized tenderness that is worse to the lower abdomen.  There is no rebound, rigidity or guarding.  The abdomen is completely soft.  CBC shows leukocytosis and thrombocytosis.  Clear cause of leukocytosis however may be secondary to chronic prednisone use. CMP shows hypokalemia with potassium of 2.7.  Patient given 40 of K-Dur and 2 rounds of IV potassium.  Also has elevated BUN/creatinine at 38 and 1.86 which is worse from prior.  Alk phos continues to trend upward. Lipase is negative. Magnesium is normal. COVID testing is negative. Lactic acid is negative.  CT imaging of the head does not show any acute abnormalities.  CT cervical spine does not show any acute fracture or acute abnormality.  X-ray of the thoracic spine shows old healed fractures in the T-spine but no new fractures.  Chest x-ray does not show any evidence of pneumonia  or other abnormalities.  5:00 PM contact the patient's son, with her permission.  Gave update on patient's course of care including results of imaging studies and need for follow-up.  Advised on plan for admission.  They are aware in agreement with the plan.  At shift change, CT imaging of the abdomen is pending as well as urinalysis.  Care was  transitioned to [facility], PA-C with plan to follow-up on pending work-up.  Anticipate admission for further treatment of acute renal failure and dehydration.  ED Discharge Orders    None       Bishop Dublin 01/27/19 1716    Carmin Muskrat, MD 02/01/19 1712

## 2019-01-27 NOTE — Telephone Encounter (Signed)
It sounds like she is very sick and very different from usual. I would like recommend ER for better, quicker evaluation if these are recent changes. If this has been a gradual change from normal (over weeks to months) can be in office (if symptom free).

## 2019-01-27 NOTE — ED Notes (Signed)
Unable to get patient for CT scan at this time. Patient being cleaned up by nursing staff.

## 2019-01-28 LAB — CBC
HCT: 37.3 % (ref 36.0–46.0)
Hemoglobin: 12 g/dL (ref 12.0–15.0)
MCH: 31.7 pg (ref 26.0–34.0)
MCHC: 32.2 g/dL (ref 30.0–36.0)
MCV: 98.4 fL (ref 80.0–100.0)
Platelets: 371 10*3/uL (ref 150–400)
RBC: 3.79 MIL/uL — ABNORMAL LOW (ref 3.87–5.11)
RDW: 13.8 % (ref 11.5–15.5)
WBC: 22.2 10*3/uL — ABNORMAL HIGH (ref 4.0–10.5)
nRBC: 0 % (ref 0.0–0.2)

## 2019-01-28 LAB — PHOSPHORUS: Phosphorus: 3.5 mg/dL (ref 2.5–4.6)

## 2019-01-28 LAB — PROTIME-INR
INR: 0.9 (ref 0.8–1.2)
Prothrombin Time: 12.3 seconds (ref 11.4–15.2)

## 2019-01-28 LAB — URINE CULTURE: Culture: NO GROWTH

## 2019-01-28 NOTE — Progress Notes (Signed)
PROGRESS NOTE    [name] [name]  OVZ:[id number] DOB: 1940/08/15 DOA: 01/27/2019 PCP: Hoyt Koch, MD    Brief Narrative:  78 year old female with history of Crohn's disease, hypertension, GERD and Bell's palsy.  Patient admitted to the hospital with 4 to 5 days of intermittent cramping abdominal pain and bloody diarrhea.  Patient followed by GI as outpatient.  On oral steroids at home.  On presentation to emergency room, potassium was 2.7, phosphorus was 1.5.  CT scan of the abdomen showed colitis involving descending and sigmoid colon.  Admitted and treated as Crohn's flare.   Assessment & Plan:   Active Problems:   Colitis  Abdominal pain/diarrhea: Flareup of Crohn's disease.  Continue IV fluids.  Continue IV steroids.  Some clinical improvement today.  Will be seen by gastroenterology.  Anticipate changing to oral steroids tomorrow if adequate clinical improvement.  Hypokalemia: Probably due to ongoing diarrhea and use of hydrochlorothiazide.  Hydrochlorothiazide on hold.  Replaced with adequate improvement.  Recheck level tomorrow morning.  Hypophosphatemia: Replaced and improved.  Hypertension: Blood pressure is stable.  HCTZ on hold.  Will add with potassium replacement if needed.  Acute renal insufficiency: Due to #1.  Treated with IV fluids with improvement.  Recheck levels tomorrow morning.   DVT prophylaxis: SCDs Code Status: Full code Family Communication: patient's son , Mr Eddie Dibbles over the phone. Disposition Plan: Inpatient.  Anticipate home tomorrow if is stable.   Consultants:   Gastroenterology  Procedures:   None  Antimicrobials:   None   Subjective: Patient seen and examined.  She is a poor historian.  Her main complaint was back pain which is chronic.  Denies any nausea vomiting.  Reportedly had only 1 loose stool overnight.  Remains afebrile.  Objective: Vitals:   01/27/19 2200 01/27/19 2308 01/28/19 0014 01/28/19 0552  BP: 113/65 136/85   (!) 144/96  Pulse: 78 77  77  Resp: (!) 0 16  16  Temp:  98 F (36.7 C)  98 F (36.7 C)  TempSrc:  Oral  Oral  SpO2: 93% 98%  96%  Weight:   69.6 kg   Height:   5' 6"  (1.676 m)     Intake/Output Summary (Last 24 hours) at 01/28/2019 1251 Last data filed at 01/28/2019 0500 Gross per 24 hour  Intake 1596.65 ml  Output --  Net 1596.65 ml   Filed Weights   01/27/19 1109 01/28/19 0014  Weight: 79.8 kg 69.6 kg    Examination:  General exam: Appears calm and comfortable  Respiratory system: Clear to auscultation. Respiratory effort normal. Cardiovascular system: S1 & S2 heard, RRR. No JVD, murmurs, rubs, gallops or clicks. No pedal edema. Gastrointestinal system: Abdomen is nondistended, soft and nontender. No organomegaly or masses felt. Normal bowel sounds heard. Central nervous system: Alert and oriented. No focal neurological deficits. Extremities: Symmetric 5 x 5 power. Skin: No rashes, lesions or ulcers Psychiatry: Judgement and insight appear normal. Mood & affect appropriate.     Data Reviewed: I have personally reviewed following labs and imaging studies  CBC: Recent Labs  Lab 01/27/19 1151 01/28/19 0613  WBC 16.1* 22.2*  HGB 13.9 12.0  HCT 42.5 37.3  MCV 95.3 98.4  PLT 476* 412   Basic Metabolic Panel: Recent Labs  Lab 01/27/19 1151 01/27/19 1836 01/28/19 0613  NA 137 135  --   K 2.7* 3.8  --   CL 104 107  --   CO2 24 20*  --   GLUCOSE 100*  70  --   BUN 38* 30*  --   CREATININE 1.86* 1.36*  --   CALCIUM 8.3* 7.5*  --   MG 2.1 2.6*  --   PHOS  --  1.5* 3.5   GFR: Estimated Creatinine Clearance: 32.4 mL/min (A) (by C-G formula based on SCr of 1.36 mg/dL (H)). Liver Function Tests: Recent Labs  Lab 01/27/19 1151 01/27/19 1836  AST 27 30  ALT 19 16  ALKPHOS 359* 376*  BILITOT 0.7 0.3  PROT 7.2 6.0*  ALBUMIN 2.7* 2.4*   Recent Labs  Lab 01/27/19 1151  LIPASE 46   No results for input(s): AMMONIA in the last 168 hours. Coagulation  Profile: Recent Labs  Lab 01/28/19 0613  INR 0.9   Cardiac Enzymes: No results for input(s): CKTOTAL, CKMB, CKMBINDEX, TROPONINI in the last 168 hours. BNP (last 3 results) No results for input(s): PROBNP in the last 8760 hours. HbA1C: No results for input(s): HGBA1C in the last 72 hours. CBG: No results for input(s): GLUCAP in the last 168 hours. Lipid Profile: No results for input(s): CHOL, HDL, LDLCALC, TRIG, CHOLHDL, LDLDIRECT in the last 72 hours. Thyroid Function Tests: No results for input(s): TSH, T4TOTAL, FREET4, T3FREE, THYROIDAB in the last 72 hours. Anemia Panel: No results for input(s): VITAMINB12, FOLATE, FERRITIN, TIBC, IRON, RETICCTPCT in the last 72 hours. Sepsis Labs: Recent Labs  Lab 01/27/19 1254  LATICACIDVEN 1.3    Recent Results (from the past 240 hour(s))  SARS Coronavirus 2 (CEPHEID - Performed in [facility] hospital lab), Hosp Order     Status: None   Collection Time: 01/27/19 12:26 PM   Specimen: Nasopharyngeal Swab  Result Value Ref Range Status   SARS Coronavirus 2 NEGATIVE NEGATIVE Final    Comment: (NOTE) If result is NEGATIVE SARS-CoV-2 target nucleic acids are NOT DETECTED. The SARS-CoV-2 RNA is generally detectable in upper and lower  respiratory specimens during the acute phase of infection. The lowest  concentration of SARS-CoV-2 viral copies this assay can detect is 250  copies / mL. A negative result does not preclude SARS-CoV-2 infection  and should not be used as the sole basis for treatment or other  patient management decisions.  A negative result may occur with  improper specimen collection / handling, submission of specimen other  than nasopharyngeal swab, presence of viral mutation(s) within the  areas targeted by this assay, and inadequate number of viral copies  (<250 copies / mL). A negative result must be combined with clinical  observations, patient history, and epidemiological information. If result is  POSITIVE SARS-CoV-2 target nucleic acids are DETECTED. The SARS-CoV-2 RNA is generally detectable in upper and lower  respiratory specimens dur ing the acute phase of infection.  Positive  results are indicative of active infection with SARS-CoV-2.  Clinical  correlation with patient history and other diagnostic information is  necessary to determine patient infection status.  Positive results do  not rule out bacterial infection or co-infection with other viruses. If result is PRESUMPTIVE POSTIVE SARS-CoV-2 nucleic acids MAY BE PRESENT.   A presumptive positive result was obtained on the submitted specimen  and confirmed on repeat testing.  While 2019 novel coronavirus  (SARS-CoV-2) nucleic acids may be present in the submitted sample  additional confirmatory testing may be necessary for epidemiological  and / or clinical management purposes  to differentiate between  SARS-CoV-2 and other Sarbecovirus currently known to infect humans.  If clinically indicated additional testing with an alternate test  methodology (  [id number]) is advised. The SARS-CoV-2 RNA is generally  detectable in upper and lower respiratory sp ecimens during the acute  phase of infection. The expected result is Negative. Fact Sheet for Patients:  StrictlyIdeas.no Fact Sheet for Healthcare Providers: BankingDealers.co.za This test is not yet approved or cleared by the [place] FDA and has been authorized for detection and/or diagnosis of SARS-CoV-2 by FDA under an Emergency Use Authorization (EUA).  This EUA will remain in effect (meaning this test can be used) for the duration of the COVID-19 declaration under Section 564(b)(1) of the Act, 21 U.S.C. section 360bbb-3(b)(1), unless the authorization is terminated or revoked sooner. Performed at [facility], [place] [address], [place], [place] [address]          Radiology Studies: Ct Abdomen  Pelvis Wo Contrast  Result Date: 01/27/2019 CLINICAL DATA:  Lower abdominal pain for 4-5 days. EXAM: CT ABDOMEN AND PELVIS WITHOUT CONTRAST TECHNIQUE: Multidetector CT imaging of the abdomen and pelvis was performed following the standard protocol without IV contrast. COMPARISON:  None. FINDINGS: Lower chest: No acute abnormality. Calcific atherosclerotic disease of the coronary arteries and aorta. Hepatobiliary: No focal liver abnormality is seen. Status post cholecystectomy. No biliary dilatation. Pancreas: Unremarkable. No pancreatic ductal dilatation or surrounding inflammatory changes. Spleen: Normal in size without focal abnormality. Adrenals/Urinary Tract: Adrenal glands are unremarkable. Kidneys are normal, without renal calculi, focal lesion, or hydronephrosis. Bladder is unremarkable. Stomach/Bowel: Stomach is within normal limits. Appendix appears normal. No evidence of small bowel wall thickening, distention, or inflammatory changes. Circumferential mucosal thickening of the descending and sigmoid colon with mild pericolonic inflammatory changes. Scatter left colonic diverticula. Milder mucosal thickening of the transverse colon. Vascular/Lymphatic: Aortic atherosclerosis. No enlarged abdominal or pelvic lymph nodes. Reproductive: Status post hysterectomy. No adnexal masses. Other: No abdominal wall hernia or abnormality. No abdominopelvic ascites. Musculoskeletal: Lower lumbosacral spine fusion with intact hardware. 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 IMPRESSION: Descending and sigmoid colon colitis, either infectious or inflammatory. Milder inflammatory changes of the transverse colon. Calcific atherosclerotic disease of the aorta. Electronically Signed   By: Fidela Salisbury M.D.   On: 01/27/2019 17:41   Dg Chest 1 View  Result Date: 01/27/2019 CLINICAL DATA:  Fall yesterday. EXAM: CHEST  1 VIEW COMPARISON:  January 03, 2015 FINDINGS: The heart, hila, and mediastinum are  unchanged. Irregular density projected over the right first costochondral junction. The lungs are otherwise clear. Previous vertebroplasties identified. IMPRESSION: 1. Irregular density projected over the right first costochondral junction is favored to be degenerative. A superimposed nodules not excluded. A PA and lateral chest x-ray may better evaluate. 2. No acute abnormalities noted. Electronically Signed   By: Dorise Bullion III M.D   On: 01/27/2019 14:40   Dg Thoracic Spine 2 View  Result Date: 01/27/2019 CLINICAL DATA:  Patient reports a fall yesterday when her "legs gave out." No complaint of back pain, c/o abdomen pain. EXAM: THORACIC SPINE 2 VIEWS COMPARISON:  Thoracic MRI, 04/26/2017 FINDINGS: No acute fracture. There are fractures of T7 and T9, treated with vertebroplasty, performed since the prior MRI. The fractures are stable in severity from the prior MRI. No other fractures. No bone lesions.  No spondylolisthesis. There are mild disc degenerative changes most evident along the mid to lower thoracic spine reflected by mild loss of disc height and small endplate osteophytes. The skeletal structures are diffusely demineralized. Soft tissues are unremarkable. IMPRESSION: 1. No acute findings. 2. Old treated fractures of T7 and T9.  No other fractures. 3. Mild degenerative changes. Electronically Signed   By: Lajean Manes M.D.   On: 01/27/2019 14:41   Ct Head Wo Contrast  Result Date: 01/27/2019 CLINICAL DATA:  Fall this morning.  Headache and abdominal pain. EXAM: CT HEAD WITHOUT CONTRAST CT CERVICAL SPINE WITHOUT CONTRAST TECHNIQUE: Multidetector CT imaging of the head and cervical spine was performed following the standard protocol without intravenous contrast. Multiplanar CT image reconstructions of the cervical spine were also generated. COMPARISON:  CT head 08/31/2009. FINDINGS: CT HEAD FINDINGS Brain: There is no evidence of acute intracranial hemorrhage, mass lesion, brain edema or  extra-axial fluid collection. The ventricles and subarachnoid spaces are appropriately sized for age. There are moderate chronic small vessel ischemic changes in the periventricular and subcortical white matter which have progressed from 2011. No evidence of acute cortical stroke. Vascular:  No hyperdense vessel identified. Skull: Negative for fracture or focal lesion. Sinuses/Orbits: The visualized paranasal sinuses and mastoid air cells are clear. No orbital abnormalities are seen. Other: None. CT CERVICAL SPINE FINDINGS Alignment: Normal. Skull base and vertebrae: No evidence of acute fracture or traumatic subluxation. Soft tissues and spinal canal: No prevertebral fluid or swelling. No visible canal hematoma. Disc levels: The disc spaces are relatively preserved. There is mild uncinate spurring. There are moderate facet degenerative changes contributing to foraminal narrowing, greatest on the right at C3-4 and on the left at C4-5. Upper chest: Probable right apical scarring. Other: Bilateral carotid atherosclerosis. IMPRESSION: 1. No acute intracranial or calvarial findings. Progressive moderate chronic small vessel ischemic changes. 2. No evidence of acute cervical spine fracture, traumatic subluxation or static signs of instability. Electronically Signed   By: Richardean Sale M.D.   On: 01/27/2019 14:26   Ct Cervical Spine Wo Contrast  Result Date: 01/27/2019 CLINICAL DATA:  Fall this morning.  Headache and abdominal pain. EXAM: CT HEAD WITHOUT CONTRAST CT CERVICAL SPINE WITHOUT CONTRAST TECHNIQUE: Multidetector CT imaging of the head and cervical spine was performed following the standard protocol without intravenous contrast. Multiplanar CT image reconstructions of the cervical spine were also generated. COMPARISON:  CT head 08/31/2009. FINDINGS: CT HEAD FINDINGS Brain: There is no evidence of acute intracranial hemorrhage, mass lesion, brain edema or extra-axial fluid collection. The ventricles and  subarachnoid spaces are appropriately sized for age. There are moderate chronic small vessel ischemic changes in the periventricular and subcortical white matter which have progressed from 2011. No evidence of acute cortical stroke. Vascular:  No hyperdense vessel identified. Skull: Negative for fracture or focal lesion. Sinuses/Orbits: The visualized paranasal sinuses and mastoid air cells are clear. No orbital abnormalities are seen. Other: None. CT CERVICAL SPINE FINDINGS Alignment: Normal. Skull base and vertebrae: No evidence of acute fracture or traumatic subluxation. Soft tissues and spinal canal: No prevertebral fluid or swelling. No visible canal hematoma. Disc levels: The disc spaces are relatively preserved. There is mild uncinate spurring. There are moderate facet degenerative changes contributing to foraminal narrowing, greatest on the right at C3-4 and on the left at C4-5. Upper chest: Probable right apical scarring. Other: Bilateral carotid atherosclerosis. IMPRESSION: 1. No acute intracranial or calvarial findings. Progressive moderate chronic small vessel ischemic changes. 2. No evidence of acute cervical spine fracture, traumatic subluxation or static signs of instability. Electronically Signed   By: Richardean Sale M.D.   On: 01/27/2019 14:26        Scheduled Meds:  methylPREDNISolone (SOLU-MEDROL) injection  40 mg Intravenous Q12H   pantoprazole  40 mg Oral Daily  phosphorus  500 mg Oral TID   Continuous Infusions:  sodium chloride 75 mL/hr at 01/27/19 2302     LOS: 1 day    Time spent: 25 minutes    Barb Merino, MD [facility] Pager [phone]  If 7PM-7AM, please contact night-coverage [email or web address] Password [facility] 01/28/2019, 12:51 PM

## 2019-01-29 DIAGNOSIS — K529 Noninfective gastroenteritis and colitis, unspecified: Secondary | ICD-10-CM

## 2019-01-29 LAB — CBC WITH DIFFERENTIAL/PLATELET
Abs Immature Granulocytes: 0.33 10*3/uL — ABNORMAL HIGH (ref 0.00–0.07)
Basophils Absolute: 0.1 10*3/uL (ref 0.0–0.1)
Basophils Relative: 0 %
Eosinophils Absolute: 0 10*3/uL (ref 0.0–0.5)
Eosinophils Relative: 0 %
HCT: 34.3 % — ABNORMAL LOW (ref 36.0–46.0)
Hemoglobin: 11.1 g/dL — ABNORMAL LOW (ref 12.0–15.0)
Immature Granulocytes: 2 %
Lymphocytes Relative: 6 %
Lymphs Abs: 1 10*3/uL (ref 0.7–4.0)
MCH: 30.5 pg (ref 26.0–34.0)
MCHC: 32.4 g/dL (ref 30.0–36.0)
MCV: 94.2 fL (ref 80.0–100.0)
Monocytes Absolute: 0.4 10*3/uL (ref 0.1–1.0)
Monocytes Relative: 2 %
Neutro Abs: 15.7 10*3/uL — ABNORMAL HIGH (ref 1.7–7.7)
Neutrophils Relative %: 90 %
Platelets: 411 10*3/uL — ABNORMAL HIGH (ref 150–400)
RBC: 3.64 MIL/uL — ABNORMAL LOW (ref 3.87–5.11)
RDW: 13.6 % (ref 11.5–15.5)
WBC: 17.5 10*3/uL — ABNORMAL HIGH (ref 4.0–10.5)
nRBC: 0 % (ref 0.0–0.2)

## 2019-01-29 LAB — BASIC METABOLIC PANEL
Anion gap: 9 (ref 5–15)
BUN: 29 mg/dL — ABNORMAL HIGH (ref 8–23)
CO2: 20 mmol/L — ABNORMAL LOW (ref 22–32)
Calcium: 7 mg/dL — ABNORMAL LOW (ref 8.9–10.3)
Chloride: 106 mmol/L (ref 98–111)
Creatinine, Ser: 1.19 mg/dL — ABNORMAL HIGH (ref 0.44–1.00)
GFR calc Af Amer: 51 mL/min — ABNORMAL LOW (ref >60)
GFR calc non Af Amer: 44 mL/min — ABNORMAL LOW (ref >60)
Glucose, Bld: 149 mg/dL — ABNORMAL HIGH (ref 70–99)
Potassium: 4 mmol/L (ref 3.5–5.1)
Sodium: 135 mmol/L (ref 135–145)

## 2019-01-29 LAB — MAGNESIUM: Magnesium: 2.1 mg/dL (ref 1.7–2.4)

## 2019-01-29 LAB — TSH: TSH: 0.75 u[IU]/mL (ref 0.350–4.500)

## 2019-01-29 LAB — VITAMIN B12: Vitamin B-12: 1594 pg/mL — ABNORMAL HIGH (ref 180–914)

## 2019-01-29 MED ORDER — PREDNISONE 10 MG PO TABS: 10.0000 mg | ORAL_TABLET | Freq: Two times a day (BID) | ORAL | Status: DC

## 2019-01-29 MED ORDER — PREDNISONE 20 MG PO TABS
20.0000 mg | ORAL_TABLET | Freq: Two times a day (BID) | ORAL | Status: DC
  Administered 2019-01-29 – 2019-02-02 (×8): 20 mg via ORAL
  Filled 2019-01-29 (×8): qty 1

## 2019-01-29 MED ORDER — FLUOXETINE HCL 10 MG PO CAPS
10.0000 mg | ORAL_CAPSULE | Freq: Every day | ORAL | Status: DC
  Administered 2019-01-29 – 2019-02-02 (×5): 10 mg via ORAL
  Filled 2019-01-29 (×5): qty 1

## 2019-01-29 MED ORDER — AMLODIPINE BESYLATE 5 MG PO TABS
5.0000 mg | ORAL_TABLET | Freq: Every day | ORAL | Status: DC
  Administered 2019-01-30 – 2019-01-31 (×2): 5 mg via ORAL
  Filled 2019-01-29 (×2): qty 1

## 2019-01-29 NOTE — Progress Notes (Signed)
  I appreciate hospitalist care of [name] [name].  She has been a patient of mine for years with Crohn's colitis that has been difficult to control and is only really responded to steroids due to side effects or inability to afford other medications.  I think her Crohn's symptoms are relatively stable.   What I had perceived was a change in memory and also a change in functional status.  She seems depressed to me.  She was not eating, had anhedonia and was not the same person on my last visit with her.  Telehealth challenges excepted.  She has had a rising alkaline phosphatase of unclear etiology though it looks like it tracks to the liver and she is at risk of [place]   Please order the following labs:  ANA Mitochondrial antibodies B12 level  And anything else you could think of regarding memory disturbance if needed.  I think thyroid testing is relatively up-to-date but if she has not had a TSH that would be a good idea.   Strongly consider initiation of an antidepressant which her primary care provider and I can follow-up on.  Psychiatry consult may not be needed but is another consideration.  Note she has had some memory difficulties in the past but I noticed a significant change that seen subacute to acute.  I suspect she is better with hydration and general care but again there is more than just a Crohn's flare going on here.  Thank you.  [name], MD, [facility] 01/29/2019 9:10 AM Pager [phone]

## 2019-01-29 NOTE — Evaluation (Signed)
Physical Therapy Evaluation Patient Details Name: [name] [name] MRN: [id number] DOB: Apr 30, 1941 Today's Date: 01/29/2019   History of Present Illness  78 year old female with history of Crohn's disease, hypertension, GERD and Bell's palsy and admitted with Crohn's flare and colitis  Clinical Impression  Pt admitted with above diagnosis. Pt currently with functional limitations due to the deficits listed below (see PT Problem List).  Pt will benefit from skilled PT to increase their independence and safety with mobility to allow discharge to the venue listed below. Pt eager to mobilize as she is hopeful for d/c home soon.  Pt reports she lives with her son and typically able to mobilize without difficulty.  Pt agreeable to use RW initially and HHPT upon d/c.  Pt eager to return home to her dogs.       Follow Up Recommendations Home health PT;Supervision - Intermittent    Equipment Recommendations  None recommended by PT    Recommendations for Other Services       Precautions / Restrictions Precautions Precautions: Fall Restrictions Weight Bearing Restrictions: No      Mobility  Bed Mobility Overal bed mobility: Needs Assistance Bed Mobility: Supine to Sit     Supine to sit: Supervision;HOB elevated     General bed mobility comments: increased time and effort  Transfers Overall transfer level: Needs assistance Equipment used: Rolling walker (2 wheeled) Transfers: Sit to/from Stand Sit to Stand: Min assist         General transfer comment: verbal cues for hand placement, difficulty with initial rise so light assist provided  Ambulation/Gait Ambulation/Gait assistance: Min guard Gait Distance (Feet): 140 Feet Assistive device: Rolling walker (2 wheeled) Gait Pattern/deviations: Step-through pattern;Decreased stride length     General Gait Details: verbal cues for RW positioning and safety  Stairs            Wheelchair Mobility    Modified Rankin (Stroke  Patients Only)       Balance Overall balance assessment: History of Falls                                           Pertinent Vitals/Pain Pain Assessment: No/denies pain    Home Living Family/patient expects to be discharged to:: Private residence Living Arrangements: Children Available Help at Discharge: Neighbor;Available PRN/intermittently;Family Type of Home: House Home Access: Stairs to enter Entrance Stairs-Rails: Right Entrance Stairs-Number of Steps: 4-5 Home Layout: One level Home Equipment: Shower seat;Bedside commode      Prior Function Level of Independence: Independent         Comments: used cane; cooked most of the time when she felt up to it     Hand Dominance        Extremity/Trunk Assessment   Upper Extremity Assessment Upper Extremity Assessment: Overall WFL for tasks assessed    Lower Extremity Assessment Lower Extremity Assessment: Generalized weakness       Communication   Communication: No difficulties  Cognition Arousal/Alertness: Awake/alert Behavior During Therapy: WFL for tasks assessed/performed Overall Cognitive Status: Within Functional Limits for tasks assessed                                        General Comments      Exercises     Assessment/Plan    PT  Assessment Patient needs continued PT services  PT Problem List Decreased strength;Decreased mobility;Decreased balance;Decreased activity tolerance;Decreased knowledge of use of DME       PT Treatment Interventions DME instruction;Therapeutic activities;Gait training;Therapeutic exercise;Patient/family education;Functional mobility training;Balance training    PT Goals (Current goals can be found in the Care Plan section)  Acute Rehab PT Goals PT Goal Formulation: With patient Time For Goal Achievement: 02/05/19 Potential to Achieve Goals: Good    Frequency Min 3X/week   Barriers to discharge        Co-evaluation                AM-PAC PT "6 Clicks" Mobility  Outcome Measure Help needed turning from your back to your side while in a flat bed without using bedrails?: A Little Help needed moving from lying on your back to sitting on the side of a flat bed without using bedrails?: A Little Help needed moving to and from a bed to a chair (including a wheelchair)?: A Little Help needed standing up from a chair using your arms (e.g., wheelchair or bedside chair)?: A Little Help needed to walk in hospital room?: A Little Help needed climbing 3-5 steps with a railing? : A Lot 6 Click Score: 17    End of Session Equipment Utilized During Treatment: Gait belt Activity Tolerance: Patient tolerated treatment well Patient left: with chair alarm set;with call bell/phone within reach;in chair   PT Visit Diagnosis: Other abnormalities of gait and mobility (R26.89);Unsteadiness on feet (R26.81)    Time: 1216-2446 PT Time Calculation (min) (ACUTE ONLY): 13 min   Charges:   PT Evaluation $PT Eval Low Complexity: [place], PT, DPT Acute Rehabilitation Services Office: [phone] Pager: [phone]  Trena Platt 01/29/2019, 12:15 PM

## 2019-01-29 NOTE — Progress Notes (Signed)
PROGRESS NOTE    [name] [name]  KZS:[id number] DOB: 1941/04/11 DOA: 01/27/2019 PCP: Hoyt Koch, MD    Brief Narrative:  78 year old female with history of Crohn's disease, hypertension, GERD and Bell's palsy.  Patient admitted to the hospital with 4 to 5 days of intermittent cramping abdominal pain and bloody diarrhea.  Patient followed by GI as outpatient.  On oral steroids at home.  On presentation to emergency room, potassium was 2.7, phosphorus was 1.5.  CT scan of the abdomen showed colitis involving descending and sigmoid colon.  Admitted and treated as Crohn's flare and electrolyte abnormalities.   Assessment & Plan:   Active Problems:   Colitis  Abdominal pain/diarrhea: Flareup of Crohn's disease.  Patient treated with IV fluids and IV steroids.  Some clinical improvement today.  Will change to her oral steroids that she takes long-term.  Followed by gastroenterology.   Hypokalemia: Probably due to ongoing diarrhea and use of hydrochlorothiazide.  Hydrochlorothiazide on hold.  Replaced with adequate improvement.    Hypophosphatemia: Replaced and improved.  Hypertension: Blood pressure now picking up.  She is very high risk to get dehydrated.  Will start patient on amlodipine.  Discontinue hydrochlorothiazide.    Acute renal insufficiency: Due to #1.  Treated with IV fluids with improvement.    Physical debility and deconditioning: Patient with profound physical debility and deconditioning.  She will work with PT OT.  She will benefit with inpatient physical therapy at a skilled level of care before returning home with family.  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  Start patient on low-dose SSRI.  Elevated alkaline phosphatase: Other LFTs normal.  Checking ANA antimitochondrial antibody.  We will follow-up with GI as outpatient.   DVT prophylaxis: SCDs Code Status: Full code Family Communication: patient's son , Mr [name].   He was concerned about patient's debilitated condition and was inquiring about rehab potential. Disposition Plan: Inpatient.  Anticipate discharge to SNF tomorrow.   Consultants:   Gastroenterology  Procedures:   None  Antimicrobials:   None   Subjective: Patient seen and examined.  2 bowel movements last 24 hours with no bleeding.  Denies any nausea or vomiting.  She has not been able to get out of bed.  Eager to go home but she was not sure what she will do at home when her family not at home. Case discussed with gastroenterology.  They have noted patient with progressive cognitive decline and apathy.  Objective: Vitals:   01/28/19 1353 01/28/19 1528 01/28/19 2117 01/29/19 0527  BP:  134/77 (!) 147/88 (!) 174/89  Pulse:  70 64 67  Resp:  20 16 18   Temp:  97.9 F (36.6 C) 98.2 F (36.8 C) 97.7 F (36.5 C)  TempSrc:  Oral Oral Oral  SpO2: 94% 96% 97% 96%  Weight:      Height:        Intake/Output Summary (Last 24 hours) at 01/29/2019 1053 Last data filed at 01/29/2019 0908 Gross per 24 hour  Intake 1613.35 ml  Output --  Net 1613.35 ml   Filed Weights   01/27/19 1109 01/28/19 0014  Weight: 79.8 kg 69.6 kg    Examination:  General exam: Appears calm and comfortable  Respiratory system: Clear to auscultation. Respiratory effort normal. Cardiovascular system: S1 & S2 heard, RRR. No JVD, murmurs, rubs, gallops or clicks. No pedal edema. Gastrointestinal system: Abdomen is nondistended, soft and nontender. No organomegaly or masses felt. Normal bowel sounds heard.  Central nervous system: Alert and oriented. No focal neurological deficits. Extremities: Symmetric 5 x 5 power. Skin: No rashes, lesions or ulcers Psychiatry: Judgement and insight appear normal. Mood & affect appropriate.     Data Reviewed: I have personally reviewed following labs and imaging studies  CBC: Recent Labs  Lab 01/27/19 1151 01/28/19 0613 01/29/19 0549  WBC 16.1* 22.2* 17.5*   NEUTROABS  --   --  15.7*  HGB 13.9 12.0 11.1*  HCT 42.5 37.3 34.3*  MCV 95.3 98.4 94.2  PLT 476* 371 093*   Basic Metabolic Panel: Recent Labs  Lab 01/27/19 1151 01/27/19 1836 01/28/19 0613 01/29/19 0549  NA 137 135  --  135  K 2.7* 3.8  --  4.0  CL 104 107  --  106  CO2 24 20*  --  20*  GLUCOSE 100* 70  --  149*  BUN 38* 30*  --  29*  CREATININE 1.86* 1.36*  --  1.19*  CALCIUM 8.3* 7.5*  --  7.0*  MG 2.1 2.6*  --  2.1  PHOS  --  1.5* 3.5  --    GFR: Estimated Creatinine Clearance: 37.1 mL/min (A) (by C-G formula based on SCr of 1.19 mg/dL (H)). Liver Function Tests: Recent Labs  Lab 01/27/19 1151 01/27/19 1836  AST 27 30  ALT 19 16  ALKPHOS 359* 376*  BILITOT 0.7 0.3  PROT 7.2 6.0*  ALBUMIN 2.7* 2.4*   Recent Labs  Lab 01/27/19 1151  LIPASE 46   No results for input(s): AMMONIA in the last 168 hours. Coagulation Profile: Recent Labs  Lab 01/28/19 0613  INR 0.9   Cardiac Enzymes: No results for input(s): CKTOTAL, CKMB, CKMBINDEX, TROPONINI in the last 168 hours. BNP (last 3 results) No results for input(s): PROBNP in the last 8760 hours. HbA1C: No results for input(s): HGBA1C in the last 72 hours. CBG: No results for input(s): GLUCAP in the last 168 hours. Lipid Profile: No results for input(s): CHOL, HDL, LDLCALC, TRIG, CHOLHDL, LDLDIRECT in the last 72 hours. Thyroid Function Tests: No results for input(s): TSH, T4TOTAL, FREET4, T3FREE, THYROIDAB in the last 72 hours. Anemia Panel: No results for input(s): VITAMINB12, FOLATE, FERRITIN, TIBC, IRON, RETICCTPCT in the last 72 hours. Sepsis Labs: Recent Labs  Lab 01/27/19 1254  LATICACIDVEN 1.3    Recent Results (from the past 240 hour(s))  SARS Coronavirus 2 (CEPHEID - Performed in [facility] hospital lab), Hosp Order     Status: None   Collection Time: 01/27/19 12:26 PM   Specimen: Nasopharyngeal Swab  Result Value Ref Range Status   SARS Coronavirus 2 NEGATIVE NEGATIVE Final     Comment: (NOTE) If result is NEGATIVE SARS-CoV-2 target nucleic acids are NOT DETECTED. The SARS-CoV-2 RNA is generally detectable in upper and lower  respiratory specimens during the acute phase of infection. The lowest  concentration of SARS-CoV-2 viral copies this assay can detect is 250  copies / mL. A negative result does not preclude SARS-CoV-2 infection  and should not be used as the sole basis for treatment or other  patient management decisions.  A negative result may occur with  improper specimen collection / handling, submission of specimen other  than nasopharyngeal swab, presence of viral mutation(s) within the  areas targeted by this assay, and inadequate number of viral copies  (<250 copies / mL). A negative result must be combined with clinical  observations, patient history, and epidemiological information. If result is POSITIVE SARS-CoV-2 target nucleic acids are  DETECTED. The SARS-CoV-2 RNA is generally detectable in upper and lower  respiratory specimens dur ing the acute phase of infection.  Positive  results are indicative of active infection with SARS-CoV-2.  Clinical  correlation with patient history and other diagnostic information is  necessary to determine patient infection status.  Positive results do  not rule out bacterial infection or co-infection with other viruses. If result is PRESUMPTIVE POSTIVE SARS-CoV-2 nucleic acids MAY BE PRESENT.   A presumptive positive result was obtained on the submitted specimen  and confirmed on repeat testing.  While 2019 novel coronavirus  (SARS-CoV-2) nucleic acids may be present in the submitted sample  additional confirmatory testing may be necessary for epidemiological  and / or clinical management purposes  to differentiate between  SARS-CoV-2 and other Sarbecovirus currently known to infect humans.  If clinically indicated additional testing with an alternate test  methodology [phone]) is advised. The SARS-CoV-2  RNA is generally  detectable in upper and lower respiratory sp ecimens during the acute  phase of infection. The expected result is Negative. Fact Sheet for Patients:  StrictlyIdeas.no Fact Sheet for Healthcare Providers: BankingDealers.co.za This test is not yet approved or cleared by the [place] FDA and has been authorized for detection and/or diagnosis of SARS-CoV-2 by FDA under an Emergency Use Authorization (EUA).  This EUA will remain in effect (meaning this test can be used) for the duration of the COVID-19 declaration under Section 564(b)(1) of the Act, 21 U.S.C. section 360bbb-3(b)(1), unless the authorization is terminated or revoked sooner. Performed at [facility], [place] [address] [place], [place] [address]   Urine culture     Status: None   Collection Time: 01/27/19  5:03 PM   Specimen: Urine, Catheterized  Result Value Ref Range Status   Specimen Description   Final    URINE, CATHETERIZED Performed at [facility] [address] [place], [place] [address]    Special Requests   Final    NONE Performed at [facility], [place] [address], [place], [place] [address]    Culture   Final    NO GROWTH Performed at [facility], [place] [address], [place],  [address]    Report Status 01/28/2019 FINAL  Final         Radiology Studies: Ct Abdomen Pelvis Wo Contrast  Result Date: 01/27/2019 CLINICAL DATA:  Lower abdominal pain for 4-5 days. EXAM: CT ABDOMEN AND PELVIS WITHOUT CONTRAST TECHNIQUE: Multidetector CT imaging of the abdomen and pelvis was performed following the standard protocol without IV contrast. COMPARISON:  None. FINDINGS: Lower chest: No acute abnormality. Calcific atherosclerotic disease of the coronary arteries and aorta. Hepatobiliary: No focal liver abnormality is seen. Status post cholecystectomy. No biliary dilatation. Pancreas:  Unremarkable. No pancreatic ductal dilatation or surrounding inflammatory changes. Spleen: Normal in size without focal abnormality. Adrenals/Urinary Tract: Adrenal glands are unremarkable. Kidneys are normal, without renal calculi, focal lesion, or hydronephrosis. Bladder is unremarkable. Stomach/Bowel: Stomach is within normal limits. Appendix appears normal. No evidence of small bowel wall thickening, distention, or inflammatory changes. Circumferential mucosal thickening of the descending and sigmoid colon with mild pericolonic inflammatory changes. Scatter left colonic diverticula. Milder mucosal thickening of the transverse colon. Vascular/Lymphatic: Aortic atherosclerosis. No enlarged abdominal or pelvic lymph nodes. Reproductive: Status post hysterectomy. No adnexal masses. Other: No abdominal wall hernia or abnormality. No abdominopelvic ascites. Musculoskeletal: Lower lumbosacral spine fusion with intact hardware. 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 IMPRESSION: Descending  and sigmoid colon colitis, either infectious or inflammatory. Milder inflammatory changes of the transverse colon. Calcific atherosclerotic disease of the aorta. Electronically Signed   By: Fidela Salisbury M.D.   On: 01/27/2019 17:41   Dg Chest 1 View  Result Date: 01/27/2019 CLINICAL DATA:  Fall yesterday. EXAM: CHEST  1 VIEW COMPARISON:  January 03, 2015 FINDINGS: The heart, hila, and mediastinum are unchanged. Irregular density projected over the right first costochondral junction. The lungs are otherwise clear. Previous vertebroplasties identified. IMPRESSION: 1. Irregular density projected over the right first costochondral junction is favored to be degenerative. A superimposed nodules not excluded. A PA and lateral chest x-ray may better evaluate. 2. No acute abnormalities noted. Electronically Signed   By: Dorise Bullion III M.D   On: 01/27/2019 14:40   Dg Thoracic Spine 2 View  Result Date:  01/27/2019 CLINICAL DATA:  Patient reports a fall yesterday when her "legs gave out." No complaint of back pain, c/o abdomen pain. EXAM: THORACIC SPINE 2 VIEWS COMPARISON:  Thoracic MRI, 04/26/2017 FINDINGS: No acute fracture. There are fractures of T7 and T9, treated with vertebroplasty, performed since the prior MRI. The fractures are stable in severity from the prior MRI. No other fractures. No bone lesions.  No spondylolisthesis. There are mild disc degenerative changes most evident along the mid to lower thoracic spine reflected by mild loss of disc height and small endplate osteophytes. The skeletal structures are diffusely demineralized. Soft tissues are unremarkable. IMPRESSION: 1. No acute findings. 2. Old treated fractures of T7 and T9.  No other fractures. 3. Mild degenerative changes. Electronically Signed   By: Lajean Manes M.D.   On: 01/27/2019 14:41   Ct Head Wo Contrast  Result Date: 01/27/2019 CLINICAL DATA:  Fall this morning.  Headache and abdominal pain. EXAM: CT HEAD WITHOUT CONTRAST CT CERVICAL SPINE WITHOUT CONTRAST TECHNIQUE: Multidetector CT imaging of the head and cervical spine was performed following the standard protocol without intravenous contrast. Multiplanar CT image reconstructions of the cervical spine were also generated. COMPARISON:  CT head 08/31/2009. FINDINGS: CT HEAD FINDINGS Brain: There is no evidence of acute intracranial hemorrhage, mass lesion, brain edema or extra-axial fluid collection. The ventricles and subarachnoid spaces are appropriately sized for age. There are moderate chronic small vessel ischemic changes in the periventricular and subcortical white matter which have progressed from 2011. No evidence of acute cortical stroke. Vascular:  No hyperdense vessel identified. Skull: Negative for fracture or focal lesion. Sinuses/Orbits: The visualized paranasal sinuses and mastoid air cells are clear. No orbital abnormalities are seen. Other: None. CT CERVICAL  SPINE FINDINGS Alignment: Normal. Skull base and vertebrae: No evidence of acute fracture or traumatic subluxation. Soft tissues and spinal canal: No prevertebral fluid or swelling. No visible canal hematoma. Disc levels: The disc spaces are relatively preserved. There is mild uncinate spurring. There are moderate facet degenerative changes contributing to foraminal narrowing, greatest on the right at C3-4 and on the left at C4-5. Upper chest: Probable right apical scarring. Other: Bilateral carotid atherosclerosis. IMPRESSION: 1. No acute intracranial or calvarial findings. Progressive moderate chronic small vessel ischemic changes. 2. No evidence of acute cervical spine fracture, traumatic subluxation or static signs of instability. Electronically Signed   By: Richardean Sale M.D.   On: 01/27/2019 14:26   Ct Cervical Spine Wo Contrast  Result Date: 01/27/2019 CLINICAL DATA:  Fall this morning.  Headache and abdominal pain. EXAM: CT HEAD WITHOUT CONTRAST CT CERVICAL SPINE WITHOUT CONTRAST TECHNIQUE: Multidetector CT imaging of  the head and cervical spine was performed following the standard protocol without intravenous contrast. Multiplanar CT image reconstructions of the cervical spine were also generated. COMPARISON:  CT head 08/31/2009. FINDINGS: CT HEAD FINDINGS Brain: There is no evidence of acute intracranial hemorrhage, mass lesion, brain edema or extra-axial fluid collection. The ventricles and subarachnoid spaces are appropriately sized for age. There are moderate chronic small vessel ischemic changes in the periventricular and subcortical white matter which have progressed from 2011. No evidence of acute cortical stroke. Vascular:  No hyperdense vessel identified. Skull: Negative for fracture or focal lesion. Sinuses/Orbits: The visualized paranasal sinuses and mastoid air cells are clear. No orbital abnormalities are seen. Other: None. CT CERVICAL SPINE FINDINGS Alignment: Normal. Skull base and  vertebrae: No evidence of acute fracture or traumatic subluxation. Soft tissues and spinal canal: No prevertebral fluid or swelling. No visible canal hematoma. Disc levels: The disc spaces are relatively preserved. There is mild uncinate spurring. There are moderate facet degenerative changes contributing to foraminal narrowing, greatest on the right at C3-4 and on the left at C4-5. Upper chest: Probable right apical scarring. Other: Bilateral carotid atherosclerosis. IMPRESSION: 1. No acute intracranial or calvarial findings. Progressive moderate chronic small vessel ischemic changes. 2. No evidence of acute cervical spine fracture, traumatic subluxation or static signs of instability. Electronically Signed   By: Richardean Sale M.D.   On: 01/27/2019 14:26        Scheduled Meds:  FLUoxetine  10 mg Oral Daily   methylPREDNISolone (SOLU-MEDROL) injection  40 mg Intravenous Q12H   pantoprazole  40 mg Oral Daily   phosphorus  500 mg Oral TID   Continuous Infusions:  sodium chloride 75 mL/hr at 01/27/19 2302     LOS: 2 days    Time spent: 25 minutes    Barb Merino, MD [facility] Pager [phone]  If 7PM-7AM, please contact night-coverage [email or web address] Password [email or web address] 01/29/2019, 10:53 AM

## 2019-01-29 NOTE — Evaluation (Signed)
Occupational Therapy Evaluation Patient Details Name: [name] [name] MRN: [id number] DOB: [birth year] Today's Date: 01/29/2019    History of Present Illness 78 year old female with history of Crohn's disease, hypertension, GERD and Bell's palsy and admitted with Crohn's flare and colitis   Clinical Impression   Pt was admitted for the above. At baseline, she is mod I, using a cane and usually does cooking. She lives with her son, who works driving a truck. She currently needs min A for sit to stand for adls and transfers. Will follow in acute setting with supervision to set up level goals    Follow Up Recommendations  Supervision - Intermittent;Home health OT    Equipment Recommendations  None recommended by OT    Recommendations for Other Services       Precautions / Restrictions Precautions Precautions: Fall Restrictions Weight Bearing Restrictions: No      Mobility Bed Mobility Overal bed mobility: Needs Assistance Bed Mobility: Supine to Sit     Supine to sit: Supervision;HOB elevated Sit to supine: Supervision   General bed mobility comments: increased time and effort  Transfers Overall transfer level: Needs assistance Equipment used: Rolling walker (2 wheeled) Transfers: Sit to/from Stand Sit to Stand: Min assist         General transfer comment: cues for UE placement and light assistance to stand    Balance Overall balance assessment: History of Falls                                         ADL either performed or assessed with clinical judgement   ADL Overall ADL's : Needs assistance/impaired                         Toilet Transfer: Minimal assistance;Ambulation;RW(around bed to chair)             General ADL Comments: Pt needs set up and min A to stand for adls.  Pt reports knees gave out on her at home and that they do this periodically, for as long as she can remember. She uses a cane at baseline     Vision         Perception     Praxis      Pertinent Vitals/Pain Pain Assessment: No/denies pain     Hand Dominance     Extremity/Trunk Assessment Upper Extremity Assessment Upper Extremity Assessment: Overall WFL for tasks assessed   Lower Extremity Assessment Lower Extremity Assessment: Generalized weakness       Communication Communication Communication: No difficulties   Cognition Arousal/Alertness: Awake/alert Behavior During Therapy: WFL for tasks assessed/performed Overall Cognitive Status: Within Functional Limits for tasks assessed                                     General Comments       Exercises     Shoulder Instructions      Home Living Family/patient expects to be discharged to:: Private residence Living Arrangements: Children Available Help at Discharge: Neighbor;Available PRN/intermittently;Family Type of Home: House Home Access: Stairs to enter [employer] of Steps: 4-5 Entrance Stairs-Rails: Right Home Layout: One level     Bathroom Shower/Tub: Teacher, early years/pre: Standard     Home Equipment: Shower seat;Bedside commode   Additional Comments:  has DME from husband, who died 93 years ago.  Son drives a truck      Prior Functioning/Environment Level of Independence: Independent        Comments: used cane; cooked most of the time when she felt up to it        OT Problem List: Decreased strength;Decreased activity tolerance;Impaired balance (sitting and/or standing)      OT Treatment/Interventions: Self-care/ADL training;Energy conservation;DME and/or AE instruction;Patient/family education;Balance training    OT Goals(Current goals can be found in the care plan section) Acute Rehab OT Goals Patient Stated Goal: return to PLOF OT Goal Formulation: With patient Time For Goal Achievement: 02/12/19 Potential to Achieve Goals: Good ADL Goals Pt Will Transfer to Toilet: with  supervision;ambulating;bedside commode Additional ADL Goal #1: pt will complete adl with set up only, sit to stand Additional ADL Goal #2: pt will verbalize 3 energy conservation strategies  OT Frequency: Min 2X/week   Barriers to D/C:            Co-evaluation              AM-PAC OT "6 Clicks" Daily Activity     Outcome Measure Help from another person eating meals?: None Help from another person taking care of personal grooming?: A Little Help from another person toileting, which includes using toliet, bedpan, or urinal?: A Little Help from another person bathing (including washing, rinsing, drying)?: A Little Help from another person to put on and taking off regular upper body clothing?: A Little Help from another person to put on and taking off regular lower body clothing?: A Little 6 Click Score: 19   End of Session    Activity Tolerance: Patient limited by fatigue Patient left: in bed;with call bell/phone within reach;with bed alarm set  OT Visit Diagnosis: History of falling (Z91.81);Muscle weakness (generalized) (M62.81)                Time: 7939-0300 OT Time Calculation (min): 15 min Charges:  OT General Charges $OT Visit: 1 Visit OT Evaluation $OT Eval Low Complexity: [place], OTR/L Acute Rehabilitation Services [phone] [facility] pager [phone] office 01/29/2019  [place] 01/29/2019, 12:25 PM

## 2019-01-30 LAB — ANA: Anti Nuclear Antibody (ANA): NEGATIVE

## 2019-01-30 MED ORDER — ALUM & MAG HYDROXIDE-SIMETH 200-200-20 MG/5ML PO SUSP
30.0000 mL | ORAL | Status: DC | PRN
  Administered 2019-01-30 – 2019-01-31 (×2): 30 mL via ORAL
  Filled 2019-01-30 (×2): qty 30

## 2019-01-30 NOTE — NC FL2 (Signed)
[place] LEVEL OF CARE SCREENING TOOL     IDENTIFICATION  Patient Name: [name] [name] Birthdate: 1940-08-28 Sex: female Admission Date (Current Location): 01/27/2019  [facility] and [place] Number:  Herbalist and Address:  [facility],  [place] [address] [place]      Provider Number: [id number]  Attending Physician Name and Address:  Barb Merino, MD  Relative Name and Phone Number:       Current Level of Care: Hospital Recommended Level of Care: [place] Prior Approval Number:    Date Approved/Denied: 01/17/17 PASRR Number: [id number]  Discharge Plan: SNF    Current Diagnoses: Patient Active Problem List   Diagnosis Date Noted  . Colitis 01/27/2019  . Alkaline phosphatase elevation 11/19/2018  . Venous insufficiency 01/28/2018  . Routine general medical examination at a health care facility 01/10/2018  . Iron deficiency anemia due to chronic blood loss 12/18/2017  . Elevated platelet count 12/11/2017  . Memory change 11/01/2017  . Lumbar radiculopathy 12/31/2016  . Neck pain 03/05/2016  . Cervical paraspinal muscle spasm 03/01/2016  . H/O Bell's palsy 01/09/2016  . 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 Cough variant asthma vs uacs vs bronchiectasis 01/14/2015  . Hyperglycemia 10/11/2014  . Esophageal reflux 09/22/2014  . Long term (current) use of systemic steroids-prednisone 08/25/2012  . 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 Immunosuppression - on chronic prednisone, hx of Remicade and azathioprine 07/09/2011  . Crohn's disease of colon with rectal bleeding ([place])   . Hypertension   . Arthritis     Orientation RESPIRATION BLADDER Height & Weight     Self, Time, Situation, Place  Normal Continent Weight: 69.6 kg Height:  5' 6"  (167.6 cm)  BEHAVIORAL SYMPTOMS/MOOD NEUROLOGICAL BOWEL NUTRITION STATUS      Continent Diet(regular)  AMBULATORY STATUS COMMUNICATION OF NEEDS Skin   Extensive  Assist Verbally Normal                       Personal Care Assistance Level of Assistance              Functional Limitations Info             SPECIAL CARE FACTORS FREQUENCY                       Contractures Contractures Info: Not present    Additional Factors Info  Code Status Code Status Info: full             Current Medications (01/30/2019):  This is the current hospital active medication list Current Facility-Administered Medications  Medication Dose Route Frequency Provider Last Rate Last Dose  . amLODipine (NORVASC) tablet 5 mg  5 mg Oral Daily Barb Merino, MD   5 mg at 01/30/19 0935  . FLUoxetine (PROZAC) capsule 10 mg  10 mg Oral Daily Barb Merino, MD   10 mg at 01/30/19 0936  . oxyCODONE-acetaminophen (PERCOCET/ROXICET) 5-325 MG per tablet 1 tablet  1 tablet Oral Q4H PRN Bonnell Public, MD   1 tablet at 01/29/19 1909  . pantoprazole (PROTONIX) EC tablet 40 mg  40 mg Oral Daily Dana Allan I, MD   40 mg at 01/30/19 0936  . predniSONE (DELTASONE) tablet 20 mg  20 mg Oral BID WC Barb Merino, MD   20 mg at 01/30/19 0935     Discharge Medications: Please see discharge summary for a list of discharge medications.  Relevant Imaging  Results:  Relevant Lab Results:   Additional Information HCO:[id number]  Leeroy Cha, RN

## 2019-01-30 NOTE — Progress Notes (Signed)
Occupational Therapy Treatment Patient Details Name: [name] [name] MRN: [id number] DOB: 02-Dec-1940 Today's Date: 01/30/2019    History of present illness 78 year old female with history of Crohn's disease, hypertension, GERD and Bell's palsy and admitted with Crohn's flare and colitis   OT comments  Pt needed min/mod A to stand up from 3:1 commode after standing twice with min guard assist. Feel she will need snf for rehab prior to home as she needs more assistance when fatiqued   Follow Up Recommendations  SNF(pt doesn't have 24/7)    Equipment Recommendations  None recommended by OT    Recommendations for Other Services      Precautions / Restrictions Precautions Precautions: Fall Restrictions Weight Bearing Restrictions: No       Mobility Bed Mobility           Sit to supine: Min assist   General bed mobility comments: for legs as pt was tired  Transfers   Equipment used: Rolling walker (2 wheeled)             General transfer comment: min guard to stand from bed and recliner with extra time and effort.  Min/mod to stand from 3;1 commode over toilet later    Balance                                           ADL either performed or assessed with clinical judgement   ADL Overall ADL's : Needs assistance/impaired                       Lower Body Dressing Details (indicate cue type and reason): donned slippers with set up Toilet Transfer: Minimal assistance;Moderate assistance;Ambulation;BSC;RW   Toileting- Clothing Manipulation and Hygiene: Minimal assistance;Moderate assistance;Sit to/from stand(for sit to stand)         General ADL Comments: Pt seen in 2 parts today. Assisted out of bed to chair for lunch.  Pt donned slippers and gown as robe.  Returned and ambulated to bathroom at min guard level, standing from chair with min guard. She needed light mod A to stand up from 3:1 commode after this.  Ambulated in the hallway  approximately 100' with 2 standing rest breaks then back to bed     Vision       Perception     Praxis      Cognition Arousal/Alertness: Awake/alert Behavior During Therapy: WFL for tasks assessed/performed Overall Cognitive Status: Within Functional Limits for tasks assessed                                          Exercises     Shoulder Instructions       General Comments sats 98% but 2/4 dyspnea when walking    Pertinent Vitals/ Pain       Pain Assessment: No/denies pain  Home Living                                          Prior Functioning/Environment              Frequency  Min 2X/week        Progress Toward Goals  OT  Goals(current goals can now be found in the care plan section)  Progress towards OT goals: Progressing toward goals(needed more assist with toileting today)     Plan Discharge plan needs to be updated    Co-evaluation                 AM-PAC OT "6 Clicks" Daily Activity     Outcome Measure   Help from another person eating meals?: None Help from another person taking care of personal grooming?: A Little Help from another person toileting, which includes using toliet, bedpan, or urinal?: A Lot Help from another person bathing (including washing, rinsing, drying)?: A Little Help from another person to put on and taking off regular upper body clothing?: A Little Help from another person to put on and taking off regular lower body clothing?: A Little 6 Click Score: 18    End of Session    OT Visit Diagnosis: History of falling (Z91.81);Muscle weakness (generalized) (M62.81)   Activity Tolerance Patient limited by fatigue   Patient Left in bed;with call bell/phone within reach;with bed alarm set   Nurse Communication          Time: [phone] and 1300 -1320 OT Time Calculation (min): 30 min  Charges: OT General Charges $OT Visit: 1 Visit OT Treatments $Self Care/Home  Management : 8-22 mins $Therapeutic Activity: 8-22 mins  Lesle Chris, OTR/L Acute Rehabilitation Services [phone] [place] pager [phone] office 01/30/2019   [place] 01/30/2019, 2:22 PM

## 2019-01-30 NOTE — TOC Progression Note (Signed)
Transition of Care [place]) - Progression Note    Patient Details  Name: [name] [name] MRN: [id number] Date of Birth: September 30, 1940  [facility] CM/SW Contact  Leeroy Cha, RN Phone Number: 01/30/2019, 1:26 PM  Clinical Narrative:    Patient faxed out to the area snf's for placement.  Prefers [facility] snf in [place].        Expected Discharge Plan and Services                                                 Social Determinants of Health (SDOH) Interventions    Readmission Risk Interventions No flowsheet data found.

## 2019-01-30 NOTE — Progress Notes (Signed)
PROGRESS NOTE    [name] [name]  XAJ:[id number] DOB: 01-09-1941 DOA: 01/27/2019 PCP: Hoyt Koch, MD    Brief Narrative:  78 year old female with history of Crohn's disease, hypertension, GERD and Bell's palsy.  Patient admitted to the hospital with 4 to 5 days of intermittent cramping abdominal pain and bloody diarrhea.  Patient followed by GI as outpatient.  On oral steroids at home.  On presentation to emergency room, potassium was 2.7, phosphorus was 1.5.  CT scan of the abdomen showed colitis involving descending and sigmoid colon.  Admitted and treated as Crohn's flare and electrolyte abnormalities.   Assessment & Plan:   Active Problems:   Colitis  Abdominal pain/diarrhea: presumed flareup of Crohn's disease.  Patient treated with IV fluids and IV steroids.  Clinically improved.  Change to oral steroids.  Will discontinue IV fluids. Followed by gastroenterology.   Hypokalemia: Probably due to ongoing diarrhea and use of hydrochlorothiazide.  Hydrochlorothiazide on hold.  Replaced with adequate improvement.    Hypophosphatemia: Replaced and improved.  Hypertension: Blood pressure now picking up.  She is very high risk to get dehydrated. start patient on amlodipine.  Discontinue hydrochlorothiazide.    Acute renal insufficiency: Due to #1.  Treated with IV fluids with improvement.  Discontinue IV fluids.  Physical debility and deconditioning: Patient with profound physical debility and deconditioning.  She will work with PT OT.  She will benefit with inpatient physical therapy at a skilled level of care before returning home with family.  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  TSH is normal.  Started on low-dose Paxil.  Elevated alkaline phosphatase: Other LFTs normal.  Checking ANA antimitochondrial antibody.  We will follow-up with GI as outpatient.   DVT prophylaxis: SCDs Code Status: Full code Family Communication: patient's son , Mr  [name].  He was concerned about patient's debilitated condition and was inquiring about rehab potential. Disposition Plan: Inpatient.  Anticipate discharge to SNF when bed available.   Consultants:   Gastroenterology  Procedures:   None  Antimicrobials:   None   Subjective: Patient seen and examined.  No overnight events.  No more diarrhea.  Is able to eat regular diet.  She wants to go home with dogs, however also realizes that she is very deconditioned and will need rehab.  She is not able to get out of the bed by herself.  Objective: Vitals:   01/29/19 0527 01/29/19 1300 01/29/19 2004 01/30/19 0505  BP: (!) 174/89 140/77 (!) 175/88 (!) 161/93  Pulse: 67 67 62 65  Resp: 18 16 20 18   Temp: 97.7 F (36.5 C) 98.3 F (36.8 C) 98.1 F (36.7 C) 98.2 F (36.8 C)  TempSrc: Oral Oral Oral Oral  SpO2: 96% 97% 97% 97%  Weight:      Height:        Intake/Output Summary (Last 24 hours) at 01/30/2019 1049 Last data filed at 01/30/2019 0800 Gross per 24 hour  Intake 1102.5 ml  Output -  Net 1102.5 ml   Filed Weights   01/27/19 1109 01/28/19 0014  Weight: 79.8 kg 69.6 kg    Examination:  General exam: Appears calm and comfortable  Respiratory system: Clear to auscultation. Respiratory effort normal. Cardiovascular system: S1 & S2 heard, RRR. No JVD, murmurs, rubs, gallops or clicks. No pedal edema. Gastrointestinal system: Abdomen is nondistended, soft and nontender. No organomegaly or masses felt. Normal bowel sounds heard. Central nervous system: Alert and oriented. No focal neurological deficits. Extremities: Symmetric 5 x  5 power. Skin: No rashes, lesions or ulcers Psychiatry: Judgement and insight appear normal. Mood & affect appropriate.     Data Reviewed: I have personally reviewed following labs and imaging studies  CBC: Recent Labs  Lab 01/27/19 1151 01/28/19 0613 01/29/19 0549  WBC 16.1* 22.2* 17.5*  NEUTROABS  --   --  15.7*  HGB 13.9 12.0 11.1*   HCT 42.5 37.3 34.3*  MCV 95.3 98.4 94.2  PLT 476* 371 335*   Basic Metabolic Panel: Recent Labs  Lab 01/27/19 1151 01/27/19 1836 01/28/19 0613 01/29/19 0549  NA 137 135  --  135  K 2.7* 3.8  --  4.0  CL 104 107  --  106  CO2 24 20*  --  20*  GLUCOSE 100* 70  --  149*  BUN 38* 30*  --  29*  CREATININE 1.86* 1.36*  --  1.19*  CALCIUM 8.3* 7.5*  --  7.0*  MG 2.1 2.6*  --  2.1  PHOS  --  1.5* 3.5  --    GFR: Estimated Creatinine Clearance: 37.1 mL/min (A) (by C-G formula based on SCr of 1.19 mg/dL (H)). Liver Function Tests: Recent Labs  Lab 01/27/19 1151 01/27/19 1836  AST 27 30  ALT 19 16  ALKPHOS 359* 376*  BILITOT 0.7 0.3  PROT 7.2 6.0*  ALBUMIN 2.7* 2.4*   Recent Labs  Lab 01/27/19 1151  LIPASE 46   No results for input(s): AMMONIA in the last 168 hours. Coagulation Profile: Recent Labs  Lab 01/28/19 0613  INR 0.9   Cardiac Enzymes: No results for input(s): CKTOTAL, CKMB, CKMBINDEX, TROPONINI in the last 168 hours. BNP (last 3 results) No results for input(s): PROBNP in the last 8760 hours. HbA1C: No results for input(s): HGBA1C in the last 72 hours. CBG: No results for input(s): GLUCAP in the last 168 hours. Lipid Profile: No results for input(s): CHOL, HDL, LDLCALC, TRIG, CHOLHDL, LDLDIRECT in the last 72 hours. Thyroid Function Tests: Recent Labs    01/29/19 1133  TSH 0.750   Anemia Panel: Recent Labs    01/29/19 1133  VITAMINB12 1,594*   Sepsis Labs: Recent Labs  Lab 01/27/19 1254  LATICACIDVEN 1.3    Recent Results (from the past 240 hour(s))  SARS Coronavirus 2 (CEPHEID - Performed in [place] hospital lab), Hosp Order     Status: None   Collection Time: 01/27/19 12:26 PM   Specimen: Nasopharyngeal Swab  Result Value Ref Range Status   SARS Coronavirus 2 NEGATIVE NEGATIVE Final    Comment: (NOTE) If result is NEGATIVE SARS-CoV-2 target nucleic acids are NOT DETECTED. The SARS-CoV-2 RNA is generally detectable in upper  and lower  respiratory specimens during the acute phase of infection. The lowest  concentration of SARS-CoV-2 viral copies this assay can detect is 250  copies / mL. A negative result does not preclude SARS-CoV-2 infection  and should not be used as the sole basis for treatment or other  patient management decisions.  A negative result may occur with  improper specimen collection / handling, submission of specimen other  than nasopharyngeal swab, presence of viral mutation(s) within the  areas targeted by this assay, and inadequate number of viral copies  (<250 copies / mL). A negative result must be combined with clinical  observations, patient history, and epidemiological information. If result is POSITIVE SARS-CoV-2 target nucleic acids are DETECTED. The SARS-CoV-2 RNA is generally detectable in upper and lower  respiratory specimens dur ing the acute phase  of infection.  Positive  results are indicative of active infection with SARS-CoV-2.  Clinical  correlation with patient history and other diagnostic information is  necessary to determine patient infection status.  Positive results do  not rule out bacterial infection or co-infection with other viruses. If result is PRESUMPTIVE POSTIVE SARS-CoV-2 nucleic acids MAY BE PRESENT.   A presumptive positive result was obtained on the submitted specimen  and confirmed on repeat testing.  While 2019 novel coronavirus  (SARS-CoV-2) nucleic acids may be present in the submitted sample  additional confirmatory testing may be necessary for epidemiological  and / or clinical management purposes  to differentiate between  SARS-CoV-2 and other Sarbecovirus currently known to infect humans.  If clinically indicated additional testing with an alternate test  methodology [phone]) is advised. The SARS-CoV-2 RNA is generally  detectable in upper and lower respiratory sp ecimens during the acute  phase of infection. The expected result is  Negative. Fact Sheet for Patients:  StrictlyIdeas.no Fact Sheet for Healthcare Providers: BankingDealers.co.za This test is not yet approved or cleared by the [place] FDA and has been authorized for detection and/or diagnosis of SARS-CoV-2 by FDA under an Emergency Use Authorization (EUA).  This EUA will remain in effect (meaning this test can be used) for the duration of the COVID-19 declaration under Section 564(b)(1) of the Act, 21 U.S.C. section 360bbb-3(b)(1), unless the authorization is terminated or revoked sooner. Performed at [facility], [place] [address] [place], [place] [address]   Urine culture     Status: None   Collection Time: 01/27/19  5:03 PM   Specimen: Urine, Catheterized  Result Value Ref Range Status   Specimen Description   Final    URINE, CATHETERIZED Performed at [place] [address] [place], [place] [address]    Special Requests   Final    NONE Performed at [facility], [place] [address], [place], [place] [address]    Culture   Final    NO GROWTH Performed at [facility], [place] [address] [place], [place] [address]    Report Status 01/28/2019 FINAL  Final         Radiology Studies: No results found.      Scheduled Meds: . amLODipine  5 mg Oral Daily  . FLUoxetine  10 mg Oral Daily  . pantoprazole  40 mg Oral Daily  . predniSONE  20 mg Oral BID WC   Continuous Infusions: . sodium chloride 75 mL/hr at 01/29/19 1911     LOS: 3 days    Time spent: 25 minutes    Barb Merino, MD [facility] Pager [phone]  If 7PM-7AM, please contact night-coverage [email or web address] Password [facility] 01/30/2019, 10:49 AM

## 2019-01-30 NOTE — Care Management Important Message (Signed)
Important Message  Patient Details IM Letter given to Velva Harman RN to present to the Patient Name: [name] [name] MRN: [id number] Date of Birth: [birth year]   Medicare Important Message Given:  Yes     Kerin Salen 01/30/2019, 12:38 PM

## 2019-01-31 MED ORDER — AMLODIPINE BESYLATE 10 MG PO TABS
10.0000 mg | ORAL_TABLET | Freq: Every day | ORAL | Status: DC
  Administered 2019-02-01 – 2019-02-02 (×2): 10 mg via ORAL
  Filled 2019-01-31 (×2): qty 1

## 2019-01-31 NOTE — Progress Notes (Signed)
PROGRESS NOTE    [name] [name]  LFY:[id number] DOB: 05-16-1941 DOA: 01/27/2019 PCP: Hoyt Koch, MD    Brief Narrative:  78 year old female with history of Crohn's disease, hypertension, GERD and Bell's palsy.  Patient admitted to the hospital with 4 to 5 days of intermittent cramping abdominal pain and bloody diarrhea.  Patient followed by GI as outpatient.  On oral steroids at home.  On presentation to emergency room, potassium was 2.7, phosphorus was 1.5.  CT scan of the abdomen showed colitis involving descending and sigmoid colon.  Admitted and treated as Crohn's flare and electrolyte abnormalities.   Assessment & Plan:   Active Problems:   Colitis  Abdominal pain/diarrhea: presumed flareup of Crohn's disease.  Patient treated with IV fluids and IV steroids.  Clinically improved.  Changed to oral steroids. Followed by gastroenterology, will have out patient follow up.   Hypokalemia: Probably due to ongoing diarrhea and use of hydrochlorothiazide.  Hydrochlorothiazide on hold.  Replaced with adequate improvement.    Hypophosphatemia: Replaced and improved.  Hypertension: Blood pressure now picking up.  She is very high risk to get dehydrated. started patient on amlodipine.  Discontinue hydrochlorothiazide.  Will increase dose of amlodipine today.  Acute renal insufficiency: Due to #1.  Treated with IV fluids with improvement.    Physical debility and deconditioning: Patient with profound physical debility and deconditioning.  She will work with PT OT.  She will benefit with inpatient physical therapy at a skilled level of care before returning home with family.  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  TSH is normal.  Started on low-dose Paxil.  Will discharge patient on Paxil.  Elevated alkaline phosphatase: Other LFTs normal.  Checking ANA antimitochondrial antibody.  We will follow-up with GI as outpatient.   DVT prophylaxis: SCDs Code Status:  Full code Family Communication: patient's son , Mr [name].  Disposition Plan: Inpatient.  Anticipate discharge to SNF when bed available.  Patient is medically stable.   Consultants:   Gastroenterology  Procedures:   None  Antimicrobials:   None   Subjective: No overnight events. "I do not know what happened to me I was not myself, I am back to myself now"   Objective: Vitals:   01/30/19 0505 01/30/19 1337 01/30/19 2100 01/31/19 0542  BP: (!) 161/93 (!) 146/76 (!) 160/89 (!) 176/85  Pulse: 65 73 76 61  Resp: 18 20 20 16   Temp: 98.2 F (36.8 C) 98 F (36.7 C) (!) 97.3 F (36.3 C) 97.8 F (36.6 C)  TempSrc: Oral Oral    SpO2: 97% 99% 93% 96%  Weight:      Height:        Intake/Output Summary (Last 24 hours) at 01/31/2019 0954 Last data filed at 01/31/2019 0848 Gross per 24 hour  Intake 960 ml  Output -  Net 960 ml   Filed Weights   01/27/19 1109 01/28/19 0014  Weight: 79.8 kg 69.6 kg    Examination:  General exam: Appears calm and comfortable  Respiratory system: Clear to auscultation. Respiratory effort normal. Cardiovascular system: S1 & S2 heard, RRR. No JVD, murmurs, rubs, gallops or clicks. No pedal edema. Gastrointestinal system: Abdomen is nondistended, soft and nontender. No organomegaly or masses felt. Normal bowel sounds heard. Central nervous system: Alert and oriented. No focal neurological deficits. Extremities: Symmetric 5 x 5 power. Skin: No rashes, lesions or ulcers Psychiatry: Judgement and insight appear normal. Mood & affect appropriate.     Data Reviewed: I have  personally reviewed following labs and imaging studies  CBC: Recent Labs  Lab 01/27/19 1151 01/28/19 0613 01/29/19 0549  WBC 16.1* 22.2* 17.5*  NEUTROABS  --   --  15.7*  HGB 13.9 12.0 11.1*  HCT 42.5 37.3 34.3*  MCV 95.3 98.4 94.2  PLT 476* 371 338*   Basic Metabolic Panel: Recent Labs  Lab 01/27/19 1151 01/27/19 1836 01/28/19 0613 01/29/19 0549  NA 137  135  --  135  K 2.7* 3.8  --  4.0  CL 104 107  --  106  CO2 24 20*  --  20*  GLUCOSE 100* 70  --  149*  BUN 38* 30*  --  29*  CREATININE 1.86* 1.36*  --  1.19*  CALCIUM 8.3* 7.5*  --  7.0*  MG 2.1 2.6*  --  2.1  PHOS  --  1.5* 3.5  --    GFR: Estimated Creatinine Clearance: 37.1 mL/min (A) (by C-G formula based on SCr of 1.19 mg/dL (H)). Liver Function Tests: Recent Labs  Lab 01/27/19 1151 01/27/19 1836  AST 27 30  ALT 19 16  ALKPHOS 359* 376*  BILITOT 0.7 0.3  PROT 7.2 6.0*  ALBUMIN 2.7* 2.4*   Recent Labs  Lab 01/27/19 1151  LIPASE 46   No results for input(s): AMMONIA in the last 168 hours. Coagulation Profile: Recent Labs  Lab 01/28/19 0613  INR 0.9   Cardiac Enzymes: No results for input(s): CKTOTAL, CKMB, CKMBINDEX, TROPONINI in the last 168 hours. BNP (last 3 results) No results for input(s): PROBNP in the last 8760 hours. HbA1C: No results for input(s): HGBA1C in the last 72 hours. CBG: No results for input(s): GLUCAP in the last 168 hours. Lipid Profile: No results for input(s): CHOL, HDL, LDLCALC, TRIG, CHOLHDL, LDLDIRECT in the last 72 hours. Thyroid Function Tests: Recent Labs    01/29/19 1133  TSH 0.750   Anemia Panel: Recent Labs    01/29/19 1133  VITAMINB12 1,594*   Sepsis Labs: Recent Labs  Lab 01/27/19 1254  LATICACIDVEN 1.3    Recent Results (from the past 240 hour(s))  SARS Coronavirus 2 (CEPHEID - Performed in [place] hospital lab), Hosp Order     Status: None   Collection Time: 01/27/19 12:26 PM   Specimen: Nasopharyngeal Swab  Result Value Ref Range Status   SARS Coronavirus 2 NEGATIVE NEGATIVE Final    Comment: (NOTE) If result is NEGATIVE SARS-CoV-2 target nucleic acids are NOT DETECTED. The SARS-CoV-2 RNA is generally detectable in upper and lower  respiratory specimens during the acute phase of infection. The lowest  concentration of SARS-CoV-2 viral copies this assay can detect is 250  copies / mL. A negative  result does not preclude SARS-CoV-2 infection  and should not be used as the sole basis for treatment or other  patient management decisions.  A negative result may occur with  improper specimen collection / handling, submission of specimen other  than nasopharyngeal swab, presence of viral mutation(s) within the  areas targeted by this assay, and inadequate number of viral copies  (<250 copies / mL). A negative result must be combined with clinical  observations, patient history, and epidemiological information. If result is POSITIVE SARS-CoV-2 target nucleic acids are DETECTED. The SARS-CoV-2 RNA is generally detectable in upper and lower  respiratory specimens dur ing the acute phase of infection.  Positive  results are indicative of active infection with SARS-CoV-2.  Clinical  correlation with patient history and other diagnostic information is  necessary to determine patient infection status.  Positive results do  not rule out bacterial infection or co-infection with other viruses. If result is PRESUMPTIVE POSTIVE SARS-CoV-2 nucleic acids MAY BE PRESENT.   A presumptive positive result was obtained on the submitted specimen  and confirmed on repeat testing.  While 2019 novel coronavirus  (SARS-CoV-2) nucleic acids may be present in the submitted sample  additional confirmatory testing may be necessary for epidemiological  and / or clinical management purposes  to differentiate between  SARS-CoV-2 and other Sarbecovirus currently known to infect humans.  If clinically indicated additional testing with an alternate test  methodology [phone]) is advised. The SARS-CoV-2 RNA is generally  detectable in upper and lower respiratory sp ecimens during the acute  phase of infection. The expected result is Negative. Fact Sheet for Patients:  StrictlyIdeas.no Fact Sheet for Healthcare Providers: BankingDealers.co.za This test is not yet  approved or cleared by the [place] FDA and has been authorized for detection and/or diagnosis of SARS-CoV-2 by FDA under an Emergency Use Authorization (EUA).  This EUA will remain in effect (meaning this test can be used) for the duration of the COVID-19 declaration under Section 564(b)(1) of the Act, 21 U.S.C. section 360bbb-3(b)(1), unless the authorization is terminated or revoked sooner. Performed at [facility], [place] [address], [place], [place] [address]   Urine culture     Status: None   Collection Time: 01/27/19  5:03 PM   Specimen: Urine, Catheterized  Result Value Ref Range Status   Specimen Description   Final    URINE, CATHETERIZED Performed at [facility] [address] [place], [place] [address]    Special Requests   Final    NONE Performed at [facility], [place] [address], [place], [place] [address]    Culture   Final    NO GROWTH Performed at [facility], [place] [address], [place], [place] [address]    Report Status 01/28/2019 FINAL  Final         Radiology Studies: No results found.      Scheduled Meds: . amLODipine  5 mg Oral Daily  . FLUoxetine  10 mg Oral Daily  . pantoprazole  40 mg Oral Daily  . predniSONE  20 mg Oral BID WC   Continuous Infusions:    LOS: 4 days    Time spent: 25 minutes    Barb Merino, MD [facility] Pager [phone]  If 7PM-7AM, please contact night-coverage [email or web address] Password [facility] 01/31/2019, 9:54 AM

## 2019-02-01 LAB — MITOCHONDRIAL ANTIBODIES: Mitochondrial M2 Ab, IgG: 181.8 Units — ABNORMAL HIGH (ref 0.0–20.0)

## 2019-02-01 MED ORDER — HYDRALAZINE HCL 20 MG/ML IJ SOLN
10.0000 mg | Freq: Four times a day (QID) | INTRAMUSCULAR | Status: DC | PRN
  Administered 2019-02-01: 10 mg via INTRAVENOUS
  Filled 2019-02-01: qty 1

## 2019-02-01 NOTE — Progress Notes (Signed)
PROGRESS NOTE    [name] [name]  QMG:[id number] DOB: [birth year] DOA: 01/27/2019 PCP: Hoyt Koch, MD    Brief Narrative:  78 year old female with history of Crohn's disease, hypertension, GERD and Bell's palsy.  Patient admitted to the hospital with 4 to 5 days of intermittent cramping abdominal pain and bloody diarrhea.  Patient followed by GI as outpatient.  On oral steroids at home.  On presentation to emergency room, potassium was 2.7, phosphorus was 1.5.  CT scan of the abdomen showed colitis involving descending and sigmoid colon.  Admitted and treated as Crohn's flare and electrolyte abnormalities.  Assessment & Plan:   Active Problems:   Colitis  Abdominal pain/diarrhea: presumed flareup of Crohn's disease.  Patient treated with IV fluids and IV steroids.  Clinically improved.  Changed to oral steroids. Followed by gastroenterology, will have out patient follow up.   Hypokalemia: Probably due to ongoing diarrhea and use of hydrochlorothiazide.  Hydrochlorothiazide on hold.  Replaced with adequate improvement.    Hypophosphatemia: Replaced and improved.  Hypertension: Blood pressure now picking up.  She is very high risk to get dehydrated. started patient on amlodipine.  Discontinue hydrochlorothiazide.  Better controlled with increasing amlodipine now.   Acute renal insufficiency: Due to #1.  Treated with IV fluids with improvement.    Physical debility and deconditioning: Patient with profound physical debility and deconditioning.  She will work with PT OT.  She will benefit with inpatient physical therapy at a skilled level of care before returning home with family.  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  TSH is normal.  Started on low-dose Paxil.  Will discharge patient on Paxil.  Elevated alkaline phosphatase: Other LFTs normal.  Checking ANA antimitochondrial antibody.  We will follow-up with GI as outpatient.   DVT prophylaxis: SCDs Code  Status: Full code Family Communication: patient's son , Mr [name].  Disposition Plan: Inpatient.  Anticipate discharge to SNF when bed available.  Patient is medically stable.   Consultants:   Gastroenterology  Procedures:   None  Antimicrobials:   None   Subjective: No overnight events. Waiting to go to rehab at SNF    Objective: Vitals:   01/31/19 2017 02/01/19 0500 02/01/19 0644 02/01/19 0904  BP: (!) 164/92 (!) 177/88 125/64 129/80  Pulse: 63 68  97  Resp: 15 18    Temp: 97.9 F (36.6 C) 97.9 F (36.6 C)    TempSrc: Oral Oral    SpO2: 96% 97%    Weight:      Height:        Intake/Output Summary (Last 24 hours) at 02/01/2019 1056 Last data filed at 02/01/2019 0955 Gross per 24 hour  Intake 660 ml  Output -  Net 660 ml   Filed Weights   01/27/19 1109 01/28/19 0014  Weight: 79.8 kg 69.6 kg    Examination:  General exam: Appears calm and comfortable  Respiratory system: Clear to auscultation. Respiratory effort normal. Cardiovascular system: S1 & S2 heard, RRR. No JVD, murmurs, rubs, gallops or clicks. No pedal edema. Gastrointestinal system: Abdomen is nondistended, soft and nontender. No organomegaly or masses felt. Normal bowel sounds heard. Central nervous system: Alert and oriented. No focal neurological deficits. Extremities: Symmetric 5 x 5 power. Skin: No rashes, lesions or ulcers Psychiatry: Judgement and insight appear normal. Mood & affect appropriate.     Data Reviewed: I have personally reviewed following labs and imaging studies  CBC: Recent Labs  Lab 01/27/19 1151 01/28/19 8916 01/29/19 0549  WBC 16.1* 22.2* 17.5*  NEUTROABS  --   --  15.7*  HGB 13.9 12.0 11.1*  HCT 42.5 37.3 34.3*  MCV 95.3 98.4 94.2  PLT 476* 371 419*   Basic Metabolic Panel: Recent Labs  Lab 01/27/19 1151 01/27/19 1836 01/28/19 0613 01/29/19 0549  NA 137 135  --  135  K 2.7* 3.8  --  4.0  CL 104 107  --  106  CO2 24 20*  --  20*  GLUCOSE 100* 70   --  149*  BUN 38* 30*  --  29*  CREATININE 1.86* 1.36*  --  1.19*  CALCIUM 8.3* 7.5*  --  7.0*  MG 2.1 2.6*  --  2.1  PHOS  --  1.5* 3.5  --    GFR: Estimated Creatinine Clearance: 37.1 mL/min (A) (by C-G formula based on SCr of 1.19 mg/dL (H)). Liver Function Tests: Recent Labs  Lab 01/27/19 1151 01/27/19 1836  AST 27 30  ALT 19 16  ALKPHOS 359* 376*  BILITOT 0.7 0.3  PROT 7.2 6.0*  ALBUMIN 2.7* 2.4*   Recent Labs  Lab 01/27/19 1151  LIPASE 46   No results for input(s): AMMONIA in the last 168 hours. Coagulation Profile: Recent Labs  Lab 01/28/19 0613  INR 0.9   Cardiac Enzymes: No results for input(s): CKTOTAL, CKMB, CKMBINDEX, TROPONINI in the last 168 hours. BNP (last 3 results) No results for input(s): PROBNP in the last 8760 hours. HbA1C: No results for input(s): HGBA1C in the last 72 hours. CBG: No results for input(s): GLUCAP in the last 168 hours. Lipid Profile: No results for input(s): CHOL, HDL, LDLCALC, TRIG, CHOLHDL, LDLDIRECT in the last 72 hours. Thyroid Function Tests: Recent Labs    01/29/19 1133  TSH 0.750   Anemia Panel: Recent Labs    01/29/19 1133  VITAMINB12 1,594*   Sepsis Labs: Recent Labs  Lab 01/27/19 1254  LATICACIDVEN 1.3    Recent Results (from the past 240 hour(s))  SARS Coronavirus 2 (CEPHEID - Performed in [place] hospital lab), Hosp Order     Status: None   Collection Time: 01/27/19 12:26 PM   Specimen: Nasopharyngeal Swab  Result Value Ref Range Status   SARS Coronavirus 2 NEGATIVE NEGATIVE Final    Comment: (NOTE) If result is NEGATIVE SARS-CoV-2 target nucleic acids are NOT DETECTED. The SARS-CoV-2 RNA is generally detectable in upper and lower  respiratory specimens during the acute phase of infection. The lowest  concentration of SARS-CoV-2 viral copies this assay can detect is 250  copies / mL. A negative result does not preclude SARS-CoV-2 infection  and should not be used as the sole basis for  treatment or other  patient management decisions.  A negative result may occur with  improper specimen collection / handling, submission of specimen other  than nasopharyngeal swab, presence of viral mutation(s) within the  areas targeted by this assay, and inadequate number of viral copies  (<250 copies / mL). A negative result must be combined with clinical  observations, patient history, and epidemiological information. If result is POSITIVE SARS-CoV-2 target nucleic acids are DETECTED. The SARS-CoV-2 RNA is generally detectable in upper and lower  respiratory specimens dur ing the acute phase of infection.  Positive  results are indicative of active infection with SARS-CoV-2.  Clinical  correlation with patient history and other diagnostic information is  necessary to determine patient infection status.  Positive results do  not rule out bacterial infection or co-infection with other  viruses. If result is PRESUMPTIVE POSTIVE SARS-CoV-2 nucleic acids MAY BE PRESENT.   A presumptive positive result was obtained on the submitted specimen  and confirmed on repeat testing.  While 2019 novel coronavirus  (SARS-CoV-2) nucleic acids may be present in the submitted sample  additional confirmatory testing may be necessary for epidemiological  and / or clinical management purposes  to differentiate between  SARS-CoV-2 and other Sarbecovirus currently known to infect humans.  If clinically indicated additional testing with an alternate test  methodology [phone]) is advised. The SARS-CoV-2 RNA is generally  detectable in upper and lower respiratory sp ecimens during the acute  phase of infection. The expected result is Negative. Fact Sheet for Patients:  StrictlyIdeas.no Fact Sheet for Healthcare Providers: BankingDealers.co.za This test is not yet approved or cleared by the [place] FDA and has been authorized for detection and/or  diagnosis of SARS-CoV-2 by FDA under an Emergency Use Authorization (EUA).  This EUA will remain in effect (meaning this test can be used) for the duration of the COVID-19 declaration under Section 564(b)(1) of the Act, 21 U.S.C. section 360bbb-3(b)(1), unless the authorization is terminated or revoked sooner. Performed at [facility], [place] [address], [place], [place] [address]   Urine culture     Status: None   Collection Time: 01/27/19  5:03 PM   Specimen: Urine, Catheterized  Result Value Ref Range Status   Specimen Description   Final    URINE, CATHETERIZED Performed at [place] [facility] [address], [place], [place] [address]    Special Requests   Final    NONE Performed at [facility], [place] [address], [place], [place] [address]    Culture   Final    NO GROWTH Performed at [facility], [place] [address], [place], [place] [address]    Report Status 01/28/2019 FINAL  Final         Radiology Studies: No results found.      Scheduled Meds: . amLODipine  10 mg Oral Daily  . FLUoxetine  10 mg Oral Daily  . pantoprazole  40 mg Oral Daily  . predniSONE  20 mg Oral BID WC   Continuous Infusions:    LOS: 5 days    Time spent: 15 minutes    Barb Merino, MD [facility] Pager [phone]  If 7PM-7AM, please contact night-coverage [email or web address] Password [facility] 02/01/2019, 10:56 AM

## 2019-02-02 ENCOUNTER — Telehealth: Payer: Self-pay | Admitting: *Deleted

## 2019-02-02 DIAGNOSIS — H04123 Dry eye syndrome of bilateral lacrimal glands: Secondary | ICD-10-CM | POA: Diagnosis not present

## 2019-02-02 DIAGNOSIS — G8929 Other chronic pain: Secondary | ICD-10-CM | POA: Diagnosis not present

## 2019-02-02 DIAGNOSIS — M2550 Pain in unspecified joint: Secondary | ICD-10-CM | POA: Diagnosis not present

## 2019-02-02 DIAGNOSIS — M6281 Muscle weakness (generalized): Secondary | ICD-10-CM | POA: Diagnosis not present

## 2019-02-02 DIAGNOSIS — I10 Essential (primary) hypertension: Secondary | ICD-10-CM | POA: Diagnosis not present

## 2019-02-02 DIAGNOSIS — E876 Hypokalemia: Secondary | ICD-10-CM | POA: Diagnosis not present

## 2019-02-02 DIAGNOSIS — Z7952 Long term (current) use of systemic steroids: Secondary | ICD-10-CM | POA: Diagnosis not present

## 2019-02-02 DIAGNOSIS — R52 Pain, unspecified: Secondary | ICD-10-CM | POA: Diagnosis not present

## 2019-02-02 DIAGNOSIS — R109 Unspecified abdominal pain: Secondary | ICD-10-CM | POA: Diagnosis not present

## 2019-02-02 DIAGNOSIS — G510 Bell's palsy: Secondary | ICD-10-CM | POA: Diagnosis not present

## 2019-02-02 DIAGNOSIS — R41841 Cognitive communication deficit: Secondary | ICD-10-CM | POA: Diagnosis not present

## 2019-02-02 DIAGNOSIS — R5381 Other malaise: Secondary | ICD-10-CM | POA: Diagnosis not present

## 2019-02-02 DIAGNOSIS — M19012 Primary osteoarthritis, left shoulder: Secondary | ICD-10-CM | POA: Diagnosis not present

## 2019-02-02 DIAGNOSIS — R58 Hemorrhage, not elsewhere classified: Secondary | ICD-10-CM | POA: Diagnosis not present

## 2019-02-02 DIAGNOSIS — Z7401 Bed confinement status: Secondary | ICD-10-CM | POA: Diagnosis not present

## 2019-02-02 DIAGNOSIS — M545 Low back pain: Secondary | ICD-10-CM | POA: Diagnosis not present

## 2019-02-02 DIAGNOSIS — K7290 Hepatic failure, unspecified without coma: Secondary | ICD-10-CM | POA: Diagnosis not present

## 2019-02-02 DIAGNOSIS — M1612 Unilateral primary osteoarthritis, left hip: Secondary | ICD-10-CM | POA: Diagnosis not present

## 2019-02-02 DIAGNOSIS — E43 Unspecified severe protein-calorie malnutrition: Secondary | ICD-10-CM | POA: Diagnosis not present

## 2019-02-02 DIAGNOSIS — K50919 Crohn's disease, unspecified, with unspecified complications: Secondary | ICD-10-CM | POA: Diagnosis not present

## 2019-02-02 DIAGNOSIS — R2681 Unsteadiness on feet: Secondary | ICD-10-CM | POA: Diagnosis not present

## 2019-02-02 DIAGNOSIS — Z723 Lack of physical exercise: Secondary | ICD-10-CM | POA: Diagnosis not present

## 2019-02-02 DIAGNOSIS — Z9181 History of falling: Secondary | ICD-10-CM | POA: Diagnosis not present

## 2019-02-02 DIAGNOSIS — K529 Noninfective gastroenteritis and colitis, unspecified: Secondary | ICD-10-CM | POA: Diagnosis not present

## 2019-02-02 DIAGNOSIS — R1084 Generalized abdominal pain: Secondary | ICD-10-CM | POA: Diagnosis not present

## 2019-02-02 DIAGNOSIS — R1310 Dysphagia, unspecified: Secondary | ICD-10-CM | POA: Diagnosis not present

## 2019-02-02 DIAGNOSIS — M810 Age-related osteoporosis without current pathological fracture: Secondary | ICD-10-CM | POA: Diagnosis not present

## 2019-02-02 DIAGNOSIS — R278 Other lack of coordination: Secondary | ICD-10-CM | POA: Diagnosis not present

## 2019-02-02 DIAGNOSIS — M19011 Primary osteoarthritis, right shoulder: Secondary | ICD-10-CM | POA: Diagnosis not present

## 2019-02-02 DIAGNOSIS — K219 Gastro-esophageal reflux disease without esophagitis: Secondary | ICD-10-CM | POA: Diagnosis not present

## 2019-02-02 DIAGNOSIS — F329 Major depressive disorder, single episode, unspecified: Secondary | ICD-10-CM | POA: Diagnosis not present

## 2019-02-02 MED ORDER — AMLODIPINE BESYLATE 10 MG PO TABS: 10.0000 mg | ORAL_TABLET | Freq: Every day | ORAL | Status: DC

## 2019-02-02 MED ORDER — PANTOPRAZOLE SODIUM 40 MG PO TBEC: 40.0000 mg | DELAYED_RELEASE_TABLET | Freq: Every day | ORAL | 0 refills | Status: DC

## 2019-02-02 MED ORDER — FLUOXETINE HCL 10 MG PO CAPS: 10.0000 mg | ORAL_CAPSULE | Freq: Every day | ORAL | 0 refills | Status: DC

## 2019-02-02 NOTE — Progress Notes (Signed)
This shift pt was persuaded to ambulate the hall, approx 100 feet with rests in beteween. When completed returned to room and dangled at the bedside for 20 minutes. Will continue to monitor

## 2019-02-02 NOTE — Telephone Encounter (Signed)
Pt was on TCM report admitted 01/27/19 to the hospital with 4 to 5 days of intermittent cramping abdominal pain and bloody diarrhea. CT scan of the abdomen showed colitis involving descending and sigmoid colon. Pt D/C 02/02/19 to SNF. Per summary will need to follow-up w/PCP after discharge from SNF.Marland KitchenJohny Chess

## 2019-02-02 NOTE — TOC Transition Note (Addendum)
Transition of Care [facility]) - CM/SW Discharge Note   Patient Details  Name: [name] [name] MRN: [id number] Date of Birth: 1941/01/15  [facility] CM/SW Contact:  Lynnell Catalan, RN Phone Number: 02/02/2019, 11:22 AM   Clinical Narrative:     [facility] offered SNF bed and pt accepted. Covid test results and DC summary sent to liaison at [facility]. Pt to go to room 204. RN to to call report to [phone]. [facility] called for transport.        Patient Goals and CMS Choice( website down)        Discharge Placement                       Discharge Plan and Services                                     Social Determinants of Health (SDOH) Interventions     Readmission Risk Interventions No flowsheet data found.

## 2019-02-02 NOTE — Discharge Summary (Signed)
Physician Discharge Summary  Woodward ZOX:[id number] DOB: 1941-06-09 DOA: 01/27/2019  PCP: Hoyt Koch, MD  Admit date: 01/27/2019 Discharge date: 02/02/2019  Admitted From: Home. Disposition: Home.  Recommendations for Outpatient Follow-up:  1. Follow up with PCP in 1-2 weeks 2. Follow-up with gastroenterology as scheduled.  Home Health: Not applicable Equipment/Devices: Not applicable  Discharge Condition: Stable CODE STATUS: Full code Diet recommendation: Low-salt diet  Brief/Interim Summary: 78 year old female with history of Crohn's disease, hypertension, GERD and Bell's palsy.  Patient admitted to the hospital with 4 to 5 days of intermittent cramping abdominal pain and bloody diarrhea.  Patient followed by GI as outpatient.  On oral steroids at home. On presentation to emergency room, potassium was 2.7, phosphorus was 1.5.  CT scan of the abdomen showed colitis involving descending and sigmoid colon.  Admitted and treated as Crohn's flare and electrolyte abnormalities.  Discharge Diagnoses:  Active Problems:   Colitis  Abdominal pain/diarrhea: presumed flareup of Crohn's disease.  Patient treated with IV fluids and IV steroids.  Clinically improved. Changed to oral steroids. Followed by gastroenterology, will have out patient follow up.   Electrolyte abnormalities: Replaced and improved.  Hypertension: Blood pressure now picking up.  She is very high risk to get dehydrated. started patient on amlodipine.  Discontinue hydrochlorothiazide.  Better controlled with increasing amlodipine now.   On amlodipine 10 mg.  Acute renal insufficiency: Due to #1.  Treated with IV fluids with improvement.    Physical debility and deconditioning: Patient with profound physical debility and deconditioning.  She will work with PT OT.  She will benefit with inpatient physical therapy at a skilled level of care before returning home with family.  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  TSH is normal.  Started on low-dose Paxil.  Will discharge patient on Paxil.  Patient needs a follow-up and further increasing dose of Paxil.   Elevated alkaline phosphatase: Other LFTs normal.  Checking ANA antimitochondrial antibody.  We will follow-up with GI as outpatient.  Patient currently clinically stable to transfer to skilled level of care to continue inpatient physical therapy.  Discharge Instructions  Discharge Instructions    Diet - low sodium heart healthy   Complete by: As directed    Increase activity slowly   Complete by: As directed      Allergies as of 02/02/2019      Reactions   Azathioprine Nausea Only   Elevated lipase also - ? Pancreatitis   Entyvio [vedolizumab] Shortness Of Breath   Adhesive [tape] Rash   Taking skin off   Sulfa Antibiotics Itching, Rash      Medication List    STOP taking these medications   hydrochlorothiazide 25 MG tablet Commonly known as: HYDRODIURIL     TAKE these medications   acetaminophen 325 MG tablet Commonly known as: TYLENOL Take 650 mg by mouth every 6 (six) hours as needed for moderate pain.   amLODipine 10 MG tablet Commonly known as: NORVASC Take 1 tablet (10 mg total) by mouth daily. Start taking on: February 03, 2019   dicyclomine 20 MG tablet Commonly known as: BENTYL Take 1 tablet (20 mg total) by mouth 3 (three) times daily as needed for spasms (abdominal pain).   FLUoxetine 10 MG capsule Commonly known as: PROZAC Take 1 capsule (10 mg total) by mouth daily. Start taking on: February 03, 2019   multivitamin with minerals Tabs tablet Take 1 tablet by mouth daily.   pantoprazole 40 MG tablet  Commonly known as: PROTONIX Take 1 tablet (40 mg total) by mouth daily. Start taking on: February 03, 2019   predniSONE 10 MG tablet Commonly known as: DELTASONE Take 20 mg by mouth 2 (two) times daily with a meal.       Allergies  Allergen Reactions  . Azathioprine Nausea Only     Elevated lipase also - ? Pancreatitis   . Entyvio [Vedolizumab] Shortness Of Breath  . Adhesive [Tape] Rash    Taking skin off  . Sulfa Antibiotics Itching and Rash    Consultations:  Gastroenterology   Procedures/Studies: Ct Abdomen Pelvis Wo Contrast  Result Date: 01/27/2019 CLINICAL DATA:  Lower abdominal pain for 4-5 days. EXAM: CT ABDOMEN AND PELVIS WITHOUT CONTRAST TECHNIQUE: Multidetector CT imaging of the abdomen and pelvis was performed following the standard protocol without IV contrast. COMPARISON:  None. FINDINGS: Lower chest: No acute abnormality. Calcific atherosclerotic disease of the coronary arteries and aorta. Hepatobiliary: No focal liver abnormality is seen. Status post cholecystectomy. No biliary dilatation. Pancreas: Unremarkable. No pancreatic ductal dilatation or surrounding inflammatory changes. Spleen: Normal in size without focal abnormality. Adrenals/Urinary Tract: Adrenal glands are unremarkable. Kidneys are normal, without renal calculi, focal lesion, or hydronephrosis. Bladder is unremarkable. Stomach/Bowel: Stomach is within normal limits. Appendix appears normal. No evidence of small bowel wall thickening, distention, or inflammatory changes. Circumferential mucosal thickening of the descending and sigmoid colon with mild pericolonic inflammatory changes. Scatter left colonic diverticula. Milder mucosal thickening of the transverse colon. Vascular/Lymphatic: Aortic atherosclerosis. No enlarged abdominal or pelvic lymph nodes. Reproductive: Status post hysterectomy. No adnexal masses. Other: No abdominal wall hernia or abnormality. No abdominopelvic ascites. Musculoskeletal: Lower lumbosacral spine fusion with intact hardware. 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 IMPRESSION: Descending and sigmoid colon colitis, either infectious or inflammatory. Milder inflammatory changes of the transverse colon. Calcific atherosclerotic disease of the aorta.  Electronically Signed   By: Fidela Salisbury M.D.   On: 01/27/2019 17:41   Dg Chest 1 View  Result Date: 01/27/2019 CLINICAL DATA:  Fall yesterday. EXAM: CHEST  1 VIEW COMPARISON:  January 03, 2015 FINDINGS: The heart, hila, and mediastinum are unchanged. Irregular density projected over the right first costochondral junction. The lungs are otherwise clear. Previous vertebroplasties identified. IMPRESSION: 1. Irregular density projected over the right first costochondral junction is favored to be degenerative. A superimposed nodules not excluded. A PA and lateral chest x-ray may better evaluate. 2. No acute abnormalities noted. Electronically Signed   By: Dorise Bullion III M.D   On: 01/27/2019 14:40   Dg Thoracic Spine 2 View  Result Date: 01/27/2019 CLINICAL DATA:  Patient reports a fall yesterday when her "legs gave out." No complaint of back pain, c/o abdomen pain. EXAM: THORACIC SPINE 2 VIEWS COMPARISON:  Thoracic MRI, 04/26/2017 FINDINGS: No acute fracture. There are fractures of T7 and T9, treated with vertebroplasty, performed since the prior MRI. The fractures are stable in severity from the prior MRI. No other fractures. No bone lesions.  No spondylolisthesis. There are mild disc degenerative changes most evident along the mid to lower thoracic spine reflected by mild loss of disc height and small endplate osteophytes. The skeletal structures are diffusely demineralized. Soft tissues are unremarkable. IMPRESSION: 1. No acute findings. 2. Old treated fractures of T7 and T9.  No other fractures. 3. Mild degenerative changes. Electronically Signed   By: Lajean Manes M.D.   On: 01/27/2019 14:41   Ct Head Wo Contrast  Result Date: 01/27/2019 CLINICAL DATA:  Fall this morning.  Headache and abdominal pain. EXAM: CT HEAD WITHOUT CONTRAST CT CERVICAL SPINE WITHOUT CONTRAST TECHNIQUE: Multidetector CT imaging of the head and cervical spine was performed following the standard protocol without intravenous  contrast. Multiplanar CT image reconstructions of the cervical spine were also generated. COMPARISON:  CT head 08/31/2009. FINDINGS: CT HEAD FINDINGS Brain: There is no evidence of acute intracranial hemorrhage, mass lesion, brain edema or extra-axial fluid collection. The ventricles and subarachnoid spaces are appropriately sized for age. There are moderate chronic small vessel ischemic changes in the periventricular and subcortical white matter which have progressed from 2011. No evidence of acute cortical stroke. Vascular:  No hyperdense vessel identified. Skull: Negative for fracture or focal lesion. Sinuses/Orbits: The visualized paranasal sinuses and mastoid air cells are clear. No orbital abnormalities are seen. Other: None. CT CERVICAL SPINE FINDINGS Alignment: Normal. Skull base and vertebrae: No evidence of acute fracture or traumatic subluxation. Soft tissues and spinal canal: No prevertebral fluid or swelling. No visible canal hematoma. Disc levels: The disc spaces are relatively preserved. There is mild uncinate spurring. There are moderate facet degenerative changes contributing to foraminal narrowing, greatest on the right at C3-4 and on the left at C4-5. Upper chest: Probable right apical scarring. Other: Bilateral carotid atherosclerosis. IMPRESSION: 1. No acute intracranial or calvarial findings. Progressive moderate chronic small vessel ischemic changes. 2. No evidence of acute cervical spine fracture, traumatic subluxation or static signs of instability. Electronically Signed   By: Richardean Sale M.D.   On: 01/27/2019 14:26   Ct Cervical Spine Wo Contrast  Result Date: 01/27/2019 CLINICAL DATA:  Fall this morning.  Headache and abdominal pain. EXAM: CT HEAD WITHOUT CONTRAST CT CERVICAL SPINE WITHOUT CONTRAST TECHNIQUE: Multidetector CT imaging of the head and cervical spine was performed following the standard protocol without intravenous contrast. Multiplanar CT image reconstructions of the  cervical spine were also generated. COMPARISON:  CT head 08/31/2009. FINDINGS: CT HEAD FINDINGS Brain: There is no evidence of acute intracranial hemorrhage, mass lesion, brain edema or extra-axial fluid collection. The ventricles and subarachnoid spaces are appropriately sized for age. There are moderate chronic small vessel ischemic changes in the periventricular and subcortical white matter which have progressed from 2011. No evidence of acute cortical stroke. Vascular:  No hyperdense vessel identified. Skull: Negative for fracture or focal lesion. Sinuses/Orbits: The visualized paranasal sinuses and mastoid air cells are clear. No orbital abnormalities are seen. Other: None. CT CERVICAL SPINE FINDINGS Alignment: Normal. Skull base and vertebrae: No evidence of acute fracture or traumatic subluxation. Soft tissues and spinal canal: No prevertebral fluid or swelling. No visible canal hematoma. Disc levels: The disc spaces are relatively preserved. There is mild uncinate spurring. There are moderate facet degenerative changes contributing to foraminal narrowing, greatest on the right at C3-4 and on the left at C4-5. Upper chest: Probable right apical scarring. Other: Bilateral carotid atherosclerosis. IMPRESSION: 1. No acute intracranial or calvarial findings. Progressive moderate chronic small vessel ischemic changes. 2. No evidence of acute cervical spine fracture, traumatic subluxation or static signs of instability. Electronically Signed   By: Richardean Sale M.D.   On: 01/27/2019 14:26    (Echo, Carotid, EGD, Colonoscopy, ERCP)    Subjective: Patient seen and examined today.  She is without any overnight events.  Eager to go to a skilled nursing rehab to get stronger and go back home with dogs.   Discharge Exam: Vitals:   02/01/19 1954 02/02/19 0559  BP: 120/88 (!) 154/94  Pulse:  87 73  Resp: 18 18  Temp: 97.8 F (36.6 C) 98.4 F (36.9 C)  SpO2: 100% 97%   Vitals:   02/01/19 0644 02/01/19  0904 02/01/19 1954 02/02/19 0559  BP: 125/64 129/80 120/88 (!) 154/94  Pulse:  97 87 73  Resp:   18 18  Temp:   97.8 F (36.6 C) 98.4 F (36.9 C)  TempSrc:   Oral Oral  SpO2:   100% 97%  Weight:      Height:        General: Pt is alert, awake, not in acute distress Cardiovascular: RRR, S1/S2 +, no rubs, no gallops Respiratory: CTA bilaterally, no wheezing, no rhonchi Abdominal: Soft, NT, ND, bowel sounds + Extremities: no edema, no cyanosis    The results of significant diagnostics from this hospitalization (including imaging, microbiology, ancillary and laboratory) are listed below for reference.     Microbiology: Recent Results (from the past 240 hour(s))  SARS Coronavirus 2 (CEPHEID - Performed in [place] hospital lab), Hosp Order     Status: None   Collection Time: 01/27/19 12:26 PM   Specimen: Nasopharyngeal Swab  Result Value Ref Range Status   SARS Coronavirus 2 NEGATIVE NEGATIVE Final    Comment: (NOTE) If result is NEGATIVE SARS-CoV-2 target nucleic acids are NOT DETECTED. The SARS-CoV-2 RNA is generally detectable in upper and lower  respiratory specimens during the acute phase of infection. The lowest  concentration of SARS-CoV-2 viral copies this assay can detect is 250  copies / mL. A negative result does not preclude SARS-CoV-2 infection  and should not be used as the sole basis for treatment or other  patient management decisions.  A negative result may occur with  improper specimen collection / handling, submission of specimen other  than nasopharyngeal swab, presence of viral mutation(s) within the  areas targeted by this assay, and inadequate number of viral copies  (<250 copies / mL). A negative result must be combined with clinical  observations, patient history, and epidemiological information. If result is POSITIVE SARS-CoV-2 target nucleic acids are DETECTED. The SARS-CoV-2 RNA is generally detectable in upper and lower  respiratory specimens  dur ing the acute phase of infection.  Positive  results are indicative of active infection with SARS-CoV-2.  Clinical  correlation with patient history and other diagnostic information is  necessary to determine patient infection status.  Positive results do  not rule out bacterial infection or co-infection with other viruses. If result is PRESUMPTIVE POSTIVE SARS-CoV-2 nucleic acids MAY BE PRESENT.   A presumptive positive result was obtained on the submitted specimen  and confirmed on repeat testing.  While 2019 novel coronavirus  (SARS-CoV-2) nucleic acids may be present in the submitted sample  additional confirmatory testing may be necessary for epidemiological  and / or clinical management purposes  to differentiate between  SARS-CoV-2 and other Sarbecovirus currently known to infect humans.  If clinically indicated additional testing with an alternate test  methodology [phone]) is advised. The SARS-CoV-2 RNA is generally  detectable in upper and lower respiratory sp ecimens during the acute  phase of infection. The expected result is Negative. Fact Sheet for Patients:  StrictlyIdeas.no Fact Sheet for Healthcare Providers: BankingDealers.co.za This test is not yet approved or cleared by the [place] FDA and has been authorized for detection and/or diagnosis of SARS-CoV-2 by FDA under an Emergency Use Authorization (EUA).  This EUA will remain in effect (meaning this test can be used) for the duration of the COVID-19  declaration under Section 564(b)(1) of the Act, 21 U.S.C. section 360bbb-3(b)(1), unless the authorization is terminated or revoked sooner. Performed at [facility], [place] [address], [place], [place] [address]   Urine culture     Status: None   Collection Time: 01/27/19  5:03 PM   Specimen: Urine, Catheterized  Result Value Ref Range Status   Specimen Description   Final    URINE,  CATHETERIZED Performed at [facility] [address] [place], [place] [address]    Special Requests   Final    NONE Performed at [facility], [place] [address] [place], [place] [address]    Culture   Final    NO GROWTH Performed at [facility], [place] [address] [place], [place] [address]    Report Status 01/28/2019 FINAL  Final     Labs: BNP (last 3 results) No results for input(s): BNP in the last 8760 hours. Basic Metabolic Panel: Recent Labs  Lab 01/27/19 1151 01/27/19 1836 01/28/19 0613 01/29/19 0549  NA 137 135  --  135  K 2.7* 3.8  --  4.0  CL 104 107  --  106  CO2 24 20*  --  20*  GLUCOSE 100* 70  --  149*  BUN 38* 30*  --  29*  CREATININE 1.86* 1.36*  --  1.19*  CALCIUM 8.3* 7.5*  --  7.0*  MG 2.1 2.6*  --  2.1  PHOS  --  1.5* 3.5  --    Liver Function Tests: Recent Labs  Lab 01/27/19 1151 01/27/19 1836  AST 27 30  ALT 19 16  ALKPHOS 359* 376*  BILITOT 0.7 0.3  PROT 7.2 6.0*  ALBUMIN 2.7* 2.4*   Recent Labs  Lab 01/27/19 1151  LIPASE 46   No results for input(s): AMMONIA in the last 168 hours. CBC: Recent Labs  Lab 01/27/19 1151 01/28/19 0613 01/29/19 0549  WBC 16.1* 22.2* 17.5*  NEUTROABS  --   --  15.7*  HGB 13.9 12.0 11.1*  HCT 42.5 37.3 34.3*  MCV 95.3 98.4 94.2  PLT 476* 371 411*   Cardiac Enzymes: No results for input(s): CKTOTAL, CKMB, CKMBINDEX, TROPONINI in the last 168 hours. BNP: Invalid input(s): POCBNP CBG: No results for input(s): GLUCAP in the last 168 hours. D-Dimer No results for input(s): DDIMER in the last 72 hours. Hgb A1c No results for input(s): HGBA1C in the last 72 hours. Lipid Profile No results for input(s): CHOL, HDL, LDLCALC, TRIG, CHOLHDL, LDLDIRECT in the last 72 hours. Thyroid function studies No results for input(s): TSH, T4TOTAL, T3FREE, THYROIDAB in the last 72 hours.  Invalid input(s): FREET3 Anemia work up No results for input(s): VITAMINB12,  FOLATE, FERRITIN, TIBC, IRON, RETICCTPCT in the last 72 hours. Urinalysis    Component Value Date/Time   COLORURINE YELLOW 01/27/2019 1703   APPEARANCEUR CLEAR 01/27/2019 1703   LABSPEC 1.012 01/27/2019 1703   PHURINE 6.0 01/27/2019 1703   GLUCOSEU NEGATIVE 01/27/2019 1703   HGBUR LARGE (A) 01/27/2019 1703   BILIRUBINUR NEGATIVE 01/27/2019 1703   KETONESUR NEGATIVE 01/27/2019 1703   PROTEINUR NEGATIVE 01/27/2019 1703   UROBILINOGEN 0.2 12/13/2014 1506   NITRITE NEGATIVE 01/27/2019 1703   LEUKOCYTESUR NEGATIVE 01/27/2019 1703   Sepsis Labs Invalid input(s): PROCALCITONIN,  WBC,  LACTICIDVEN Microbiology Recent Results (from the past 240 hour(s))  SARS Coronavirus 2 (CEPHEID - Performed in [place] hospital lab), Hosp Order     Status: None   Collection  Time: 01/27/19 12:26 PM   Specimen: Nasopharyngeal Swab  Result Value Ref Range Status   SARS Coronavirus 2 NEGATIVE NEGATIVE Final    Comment: (NOTE) If result is NEGATIVE SARS-CoV-2 target nucleic acids are NOT DETECTED. The SARS-CoV-2 RNA is generally detectable in upper and lower  respiratory specimens during the acute phase of infection. The lowest  concentration of SARS-CoV-2 viral copies this assay can detect is 250  copies / mL. A negative result does not preclude SARS-CoV-2 infection  and should not be used as the sole basis for treatment or other  patient management decisions.  A negative result may occur with  improper specimen collection / handling, submission of specimen other  than nasopharyngeal swab, presence of viral mutation(s) within the  areas targeted by this assay, and inadequate number of viral copies  (<250 copies / mL). A negative result must be combined with clinical  observations, patient history, and epidemiological information. If result is POSITIVE SARS-CoV-2 target nucleic acids are DETECTED. The SARS-CoV-2 RNA is generally detectable in upper and lower  respiratory specimens dur ing the  acute phase of infection.  Positive  results are indicative of active infection with SARS-CoV-2.  Clinical  correlation with patient history and other diagnostic information is  necessary to determine patient infection status.  Positive results do  not rule out bacterial infection or co-infection with other viruses. If result is PRESUMPTIVE POSTIVE SARS-CoV-2 nucleic acids MAY BE PRESENT.   A presumptive positive result was obtained on the submitted specimen  and confirmed on repeat testing.  While 2019 novel coronavirus  (SARS-CoV-2) nucleic acids may be present in the submitted sample  additional confirmatory testing may be necessary for epidemiological  and / or clinical management purposes  to differentiate between  SARS-CoV-2 and other Sarbecovirus currently known to infect humans.  If clinically indicated additional testing with an alternate test  methodology [phone]) is advised. The SARS-CoV-2 RNA is generally  detectable in upper and lower respiratory sp ecimens during the acute  phase of infection. The expected result is Negative. Fact Sheet for Patients:  StrictlyIdeas.no Fact Sheet for Healthcare Providers: BankingDealers.co.za This test is not yet approved or cleared by the [place] FDA and has been authorized for detection and/or diagnosis of SARS-CoV-2 by FDA under an Emergency Use Authorization (EUA).  This EUA will remain in effect (meaning this test can be used) for the duration of the COVID-19 declaration under Section 564(b)(1) of the Act, 21 U.S.C. section 360bbb-3(b)(1), unless the authorization is terminated or revoked sooner. Performed at [facility], [place] [address] [place], [place] [address]   Urine culture     Status: None   Collection Time: 01/27/19  5:03 PM   Specimen: Urine, Catheterized  Result Value Ref Range Status   Specimen Description   Final    URINE,  CATHETERIZED Performed at [facility] [address] [place], [place] [address]    Special Requests   Final    NONE Performed at [facility], [place] [address] [place], [place] [address]    Culture   Final    NO GROWTH Performed at [facility], [place] [address], [place], [place] [address]    Report Status 01/28/2019 FINAL  Final     Time coordinating discharge:  25 minutes  SIGNED:   Barb Merino, MD  [facility] 02/02/2019, 10:51 AM

## 2019-02-02 NOTE — Progress Notes (Signed)
Patient has discharged to SNF [facility] on 02/02/2019. Discharge instruction including medication and appointment was sent with DC package to SNF. RN called for report at 1130 and RN Claiborne Billings does not have any question at this time. PTAR is set up.

## 2019-02-05 ENCOUNTER — Other Ambulatory Visit: Payer: Self-pay | Admitting: *Deleted

## 2019-02-05 NOTE — Patient Outreach (Signed)
Member assessed for potential [facility] Care Management needs as a benefit of  [place] Medicare.  Member is currently at [facility] SNF receiving rehab therapy.   Discussed member with [facility] UM RN after her telephonic IDT meeting with facility staff today.  [facility] UM RN reports that Mrs. Keasling lived with her son prior. Her son drives trucks and member is home alone most of the day. Facility to have family care plan meeting next week to discuss further disposition plans.  Made [facility] UM RN that writer will continue to follow for disposition plans, progression, and for potential [facility] Care Management needs.   Will continue to collaborate with [facility] UM RN on member while at [facility].   Marthenia Rolling, MSN-Ed, RN,BSN [place] Acute Care Coordinator [phone] [facility]) [phone]  (Toll free office)

## 2019-02-08 DIAGNOSIS — K50919 Crohn's disease, unspecified, with unspecified complications: Secondary | ICD-10-CM | POA: Diagnosis not present

## 2019-02-08 DIAGNOSIS — R2681 Unsteadiness on feet: Secondary | ICD-10-CM | POA: Diagnosis not present

## 2019-02-08 DIAGNOSIS — Z7952 Long term (current) use of systemic steroids: Secondary | ICD-10-CM | POA: Diagnosis not present

## 2019-02-08 DIAGNOSIS — Z723 Lack of physical exercise: Secondary | ICD-10-CM | POA: Diagnosis not present

## 2019-02-08 DIAGNOSIS — K7290 Hepatic failure, unspecified without coma: Secondary | ICD-10-CM | POA: Diagnosis not present

## 2019-02-19 ENCOUNTER — Other Ambulatory Visit: Payer: Self-pay | Admitting: *Deleted

## 2019-02-19 ENCOUNTER — Telehealth: Payer: Self-pay | Admitting: Internal Medicine

## 2019-02-19 ENCOUNTER — Telehealth: Payer: Self-pay | Admitting: *Deleted

## 2019-02-19 DIAGNOSIS — R1310 Dysphagia, unspecified: Secondary | ICD-10-CM | POA: Diagnosis not present

## 2019-02-19 DIAGNOSIS — Z9181 History of falling: Secondary | ICD-10-CM | POA: Diagnosis not present

## 2019-02-19 DIAGNOSIS — H04123 Dry eye syndrome of bilateral lacrimal glands: Secondary | ICD-10-CM | POA: Diagnosis not present

## 2019-02-19 DIAGNOSIS — K529 Noninfective gastroenteritis and colitis, unspecified: Secondary | ICD-10-CM | POA: Diagnosis not present

## 2019-02-19 DIAGNOSIS — M19012 Primary osteoarthritis, left shoulder: Secondary | ICD-10-CM | POA: Diagnosis not present

## 2019-02-19 DIAGNOSIS — F329 Major depressive disorder, single episode, unspecified: Secondary | ICD-10-CM | POA: Diagnosis not present

## 2019-02-19 DIAGNOSIS — D649 Anemia, unspecified: Secondary | ICD-10-CM | POA: Diagnosis not present

## 2019-02-19 DIAGNOSIS — Z7952 Long term (current) use of systemic steroids: Secondary | ICD-10-CM | POA: Diagnosis not present

## 2019-02-19 DIAGNOSIS — K219 Gastro-esophageal reflux disease without esophagitis: Secondary | ICD-10-CM | POA: Diagnosis not present

## 2019-02-19 DIAGNOSIS — M1612 Unilateral primary osteoarthritis, left hip: Secondary | ICD-10-CM | POA: Diagnosis not present

## 2019-02-19 DIAGNOSIS — E876 Hypokalemia: Secondary | ICD-10-CM | POA: Diagnosis not present

## 2019-02-19 DIAGNOSIS — M810 Age-related osteoporosis without current pathological fracture: Secondary | ICD-10-CM | POA: Diagnosis not present

## 2019-02-19 DIAGNOSIS — R41841 Cognitive communication deficit: Secondary | ICD-10-CM | POA: Diagnosis not present

## 2019-02-19 DIAGNOSIS — E43 Unspecified severe protein-calorie malnutrition: Secondary | ICD-10-CM | POA: Diagnosis not present

## 2019-02-19 DIAGNOSIS — G8929 Other chronic pain: Secondary | ICD-10-CM | POA: Diagnosis not present

## 2019-02-19 DIAGNOSIS — G510 Bell's palsy: Secondary | ICD-10-CM | POA: Diagnosis not present

## 2019-02-19 DIAGNOSIS — M545 Low back pain: Secondary | ICD-10-CM | POA: Diagnosis not present

## 2019-02-19 DIAGNOSIS — M19011 Primary osteoarthritis, right shoulder: Secondary | ICD-10-CM | POA: Diagnosis not present

## 2019-02-19 DIAGNOSIS — I10 Essential (primary) hypertension: Secondary | ICD-10-CM | POA: Diagnosis not present

## 2019-02-19 DIAGNOSIS — Z87891 Personal history of nicotine dependence: Secondary | ICD-10-CM | POA: Diagnosis not present

## 2019-02-19 DIAGNOSIS — K7290 Hepatic failure, unspecified without coma: Secondary | ICD-10-CM | POA: Diagnosis not present

## 2019-02-19 DIAGNOSIS — Z9049 Acquired absence of other specified parts of digestive tract: Secondary | ICD-10-CM | POA: Diagnosis not present

## 2019-02-19 DIAGNOSIS — K50918 Crohn's disease, unspecified, with other complication: Secondary | ICD-10-CM | POA: Diagnosis not present

## 2019-02-19 NOTE — Telephone Encounter (Signed)
noted thanks

## 2019-02-19 NOTE — Telephone Encounter (Signed)
We can authorize it

## 2019-02-19 NOTE — Telephone Encounter (Signed)
Rec'd notification off Patient [name] [name] Portal stating on 02/02/19 pt was admitted to [name] from [facility]. On 02/18/19 @ 2:32 PM pt Discharged from [facility] to home. Will call pt to follow-up and make appt if need.Marland KitchenJohny Chess

## 2019-02-19 NOTE — Telephone Encounter (Signed)
Caller/Agency: [place] Number: [phone] [facility] to leave verbals on vm Requesting OT/PT/Skilled Nursing/Social Work/Speech Therapy: Nursing  Frequency: 1x a week for 3weeks, 1x a month for 1 month, 2 month 1,

## 2019-02-19 NOTE — Telephone Encounter (Signed)
Called pt per daughter she is schedule to see her GI provider on 03/03/19...[name] [name]

## 2019-02-19 NOTE — Telephone Encounter (Signed)
Should call GI, I have not seen or visit within 3 months and cannot supervise home health.

## 2019-02-19 NOTE — Patient Outreach (Signed)
Member assessed for potential [facility] Care Management needs as a benefit of [place] Medicare.  Confirmed with [facility] UM RN that Mrs. Krauss discharged from [facility] on 02/18/19.  Telephone call to [facility] home number to discuss [place] Management services. No answer. Telephone call made to mobile phone on file and it was member's daughter in law49 cell.  Daughter in Scientist, research (physical sciences) to contact Mrs. Colston's son [name]. She states states "[name] makes all of Mrs. Winer's decisions."  Telephone call made to [facility] to discuss [place] Management services. Patient identifiers confirmed.   [name] states "[facility] nurse came out there today and she did all of that. I do not think we need any other services."  [place] pleasantly declined [place] Management follow up.  [facility] UM RN aware.  Writer to sign off as [facility] Care Management services were declined.    Marthenia Rolling, MSN-Ed, RN,BSN [place] [facility] Care Coordinator [phone] [facility]) [phone]  (Toll free office)

## 2019-02-20 ENCOUNTER — Other Ambulatory Visit: Payer: Self-pay

## 2019-02-20 NOTE — Patient Outreach (Signed)
[facility]) Care Management  02/20/2019  [place] 09/28/1940 [id number]  EMMI: general discharge red alert Referral date: 02/20/19 Referral reason: Got discharge papers: No Insurance: Medicare Day # 1  Attempt #1  Telephone call to patient's son/ designated party release [employer] regarding EMMI general discharge red alert. Unable to reach. HIPAA compliant voice message left with call back phone number.   PLAN: RNCM will attempt 2nd telephone call to patient within 4 business days.   Quinn Plowman RN,BSN,CCM [facility] Telephonic  [phone]

## 2019-02-21 DIAGNOSIS — K219 Gastro-esophageal reflux disease without esophagitis: Secondary | ICD-10-CM | POA: Diagnosis not present

## 2019-02-21 DIAGNOSIS — M1612 Unilateral primary osteoarthritis, left hip: Secondary | ICD-10-CM | POA: Diagnosis not present

## 2019-02-21 DIAGNOSIS — Z7952 Long term (current) use of systemic steroids: Secondary | ICD-10-CM | POA: Diagnosis not present

## 2019-02-21 DIAGNOSIS — I10 Essential (primary) hypertension: Secondary | ICD-10-CM | POA: Diagnosis not present

## 2019-02-21 DIAGNOSIS — K50918 Crohn's disease, unspecified, with other complication: Secondary | ICD-10-CM | POA: Diagnosis not present

## 2019-02-21 DIAGNOSIS — F329 Major depressive disorder, single episode, unspecified: Secondary | ICD-10-CM | POA: Diagnosis not present

## 2019-02-23 ENCOUNTER — Other Ambulatory Visit: Payer: Self-pay

## 2019-02-23 ENCOUNTER — Telehealth: Payer: Self-pay | Admitting: Internal Medicine

## 2019-02-23 NOTE — Patient Outreach (Signed)
[facility]) Care Management  02/23/2019  [place] Apr 12, 1941 [id number]  EMMI: general discharge red alert Referral date: 02/20/19 Referral reason: Got discharge papers: No Insurance: Medicare Day # 1  Telephone call to patient regarding EMMI general discharge red alert. HIPAA verified by patient.  RNCM introduced herself and explained reason for call.  Patient states her son manages her health information and received her discharge paperwork.  Patient states she has a follow up appointment scheduled with Dr. Carlean Purl on 03/03/19.  Patient states she is taking her medications as prescribed. Patient states she lives with her son and her son manages her medications.  Patient states her sister provides her transportation to appointments.  Patient states she was in the hospital due to her crohns disease. Patient states she is not having any new  or ongoing symptoms since discharge. Patient states she is, " just tired."  Patient denies any further needs or concerns at this time.  RNCM advised patient to notify MD of any changes in condition prior to scheduled appointment. RNCM provided contact name and number: [phone] or main office number [phone] and 24 hour nurse advise line [phone].  RNCM verified patient aware of 911 services for urgent/ emergent needs.   PLAN:  RNCM will close case due to patient being assessed and having no further needs.  RNCM will mail patient to [facility] care management brochure/ magnet as discussed  Quinn Plowman RN,BSN,CCM [facility] Telephonic  [phone]

## 2019-02-23 NOTE — Telephone Encounter (Signed)
[place] with [facility] called and said that she will be doing  Nurse: 1x a week for 3weeks 1x a month for 1 month 2x a month for 1 month  Physical therapy: 1x a week for 1 week 2x a week for 3 week 1x a week for 4 weeks.

## 2019-02-27 DIAGNOSIS — K50918 Crohn's disease, unspecified, with other complication: Secondary | ICD-10-CM | POA: Diagnosis not present

## 2019-02-27 DIAGNOSIS — M1612 Unilateral primary osteoarthritis, left hip: Secondary | ICD-10-CM | POA: Diagnosis not present

## 2019-02-27 DIAGNOSIS — I10 Essential (primary) hypertension: Secondary | ICD-10-CM | POA: Diagnosis not present

## 2019-02-27 DIAGNOSIS — F329 Major depressive disorder, single episode, unspecified: Secondary | ICD-10-CM | POA: Diagnosis not present

## 2019-02-27 DIAGNOSIS — K219 Gastro-esophageal reflux disease without esophagitis: Secondary | ICD-10-CM | POA: Diagnosis not present

## 2019-02-27 DIAGNOSIS — Z7952 Long term (current) use of systemic steroids: Secondary | ICD-10-CM | POA: Diagnosis not present

## 2019-03-03 ENCOUNTER — Ambulatory Visit (INDEPENDENT_AMBULATORY_CARE_PROVIDER_SITE_OTHER): Payer: Medicare Other | Admitting: Internal Medicine

## 2019-03-03 ENCOUNTER — Encounter: Payer: Self-pay | Admitting: Internal Medicine

## 2019-03-03 ENCOUNTER — Other Ambulatory Visit: Payer: Self-pay

## 2019-03-03 ENCOUNTER — Other Ambulatory Visit (INDEPENDENT_AMBULATORY_CARE_PROVIDER_SITE_OTHER): Payer: Medicare Other

## 2019-03-03 VITALS — BP 150/90 | HR 100 | Temp 97.7°F | Ht 66.0 in | Wt 146.0 lb

## 2019-03-03 DIAGNOSIS — I10 Essential (primary) hypertension: Secondary | ICD-10-CM | POA: Diagnosis not present

## 2019-03-03 DIAGNOSIS — K219 Gastro-esophageal reflux disease without esophagitis: Secondary | ICD-10-CM | POA: Diagnosis not present

## 2019-03-03 DIAGNOSIS — F322 Major depressive disorder, single episode, severe without psychotic features: Secondary | ICD-10-CM

## 2019-03-03 DIAGNOSIS — F329 Major depressive disorder, single episode, unspecified: Secondary | ICD-10-CM | POA: Diagnosis not present

## 2019-03-03 DIAGNOSIS — K743 Primary biliary cirrhosis: Secondary | ICD-10-CM

## 2019-03-03 DIAGNOSIS — E46 Unspecified protein-calorie malnutrition: Secondary | ICD-10-CM

## 2019-03-03 DIAGNOSIS — K50111 Crohn's disease of large intestine with rectal bleeding: Secondary | ICD-10-CM | POA: Diagnosis not present

## 2019-03-03 DIAGNOSIS — Z7952 Long term (current) use of systemic steroids: Secondary | ICD-10-CM | POA: Diagnosis not present

## 2019-03-03 DIAGNOSIS — M1612 Unilateral primary osteoarthritis, left hip: Secondary | ICD-10-CM | POA: Diagnosis not present

## 2019-03-03 DIAGNOSIS — K50918 Crohn's disease, unspecified, with other complication: Secondary | ICD-10-CM | POA: Diagnosis not present

## 2019-03-03 HISTORY — DX: Major depressive disorder, single episode, severe without psychotic features: F32.2

## 2019-03-03 LAB — COMPREHENSIVE METABOLIC PANEL
ALT: 27 U/L (ref 0–35)
AST: 26 U/L (ref 0–37)
Albumin: 3.7 g/dL (ref 3.5–5.2)
Alkaline Phosphatase: 316 U/L — ABNORMAL HIGH (ref 39–117)
BUN: 20 mg/dL (ref 6–23)
CO2: 26 mEq/L (ref 19–32)
Calcium: 10 mg/dL (ref 8.4–10.5)
Chloride: 101 mEq/L (ref 96–112)
Creatinine, Ser: 1.28 mg/dL — ABNORMAL HIGH (ref 0.40–1.20)
GFR: 40.4 mL/min — ABNORMAL LOW (ref >60.00)
Glucose, Bld: 127 mg/dL — ABNORMAL HIGH (ref 70–99)
Potassium: 4.7 mEq/L (ref 3.5–5.1)
Sodium: 135 mEq/L (ref 135–145)
Total Bilirubin: 0.5 mg/dL (ref 0.2–1.2)
Total Protein: 7.4 g/dL (ref 6.0–8.3)

## 2019-03-03 LAB — CBC WITH DIFFERENTIAL/PLATELET
Basophils Absolute: 0.1 10*3/uL (ref 0.0–0.1)
Basophils Relative: 0.7 % (ref 0.0–3.0)
Eosinophils Absolute: 0 10*3/uL (ref 0.0–0.7)
Eosinophils Relative: 0 % (ref 0.0–5.0)
HCT: 41.6 % (ref 36.0–46.0)
Hemoglobin: 13.5 g/dL (ref 12.0–15.0)
Lymphocytes Relative: 6.7 % — ABNORMAL LOW (ref 12.0–46.0)
Lymphs Abs: 1.3 10*3/uL (ref 0.7–4.0)
MCHC: 32.4 g/dL (ref 30.0–36.0)
MCV: 93.5 fl (ref 78.0–100.0)
Monocytes Absolute: 0.7 10*3/uL (ref 0.1–1.0)
Monocytes Relative: 3.5 % (ref 3.0–12.0)
Neutro Abs: 17.9 10*3/uL — ABNORMAL HIGH (ref 1.4–7.7)
Neutrophils Relative %: 89.1 % — ABNORMAL HIGH (ref 43.0–77.0)
Platelets: 658 10*3/uL — ABNORMAL HIGH (ref 150.0–400.0)
RBC: 4.45 Mil/uL (ref 3.87–5.11)
RDW: 15.3 % (ref 11.5–15.5)
WBC: 20.1 10*3/uL (ref 4.0–10.5)

## 2019-03-03 MED ORDER — MIRTAZAPINE 7.5 MG PO TABS: 7.5000 mg | ORAL_TABLET | Freq: Every day | ORAL | 1 refills | Status: DC

## 2019-03-03 MED ORDER — URSODIOL 300 MG PO CAPS: 300.0000 mg | ORAL_CAPSULE | Freq: Two times a day (BID) | ORAL | 2 refills | Status: DC

## 2019-03-03 NOTE — Assessment & Plan Note (Addendum)
Stable it seems.  She is steroid-dependent.  Continue current dose of 20 mg prednisone.  She has trouble going below this dose and is either refractory or intolerant or cannot afford other medications.

## 2019-03-03 NOTE — Progress Notes (Addendum)
[name] [name] 78 y.o. July 31, 1940 [id number]  Assessment & Plan:  Primary biliary cholangitis ([place]) Need to start therapy with ursodiol.  300 mg twice daily hopefully will not cause diarrhea.  Liver function is otherwise good nothing concerning on imaging. I have explained the nature and course of this illness to some degree to the patient and her son today.  Crohn's disease of colon with rectal bleeding ([place]) Stable it seems.  She is steroid-dependent.  Continue current dose of 20 mg prednisone.  She has trouble going below this dose and is either refractory or intolerant or cannot afford other medications.   Severe major depression ([place]) I think this is her most significant issue right now  I will start mirtazapine 7.5 mg nightly which should help her sleep better at night and stimulate appetite I hope. Increase fluoxetine versus increase mirtazapine depending upon clinical course and follow-up. We will look in to try to facilitate further home care perhaps.  T [facility] did evaluate her but I think they did not appreciate her depression and associated memory disturbance and may have gotten the wrong idea about her situation.  Protein-calorie malnutrition ([place]) This was severe when she was in the hospital most likely and remains an issue I think due to her inadequate intake which I think is primarily a function of her depression.  Hopefully this will improve with treatment of her depression.    I will communicate with PHN about possible increased home care for other options and whether or not they can help with the Medicaid application.  She will follow-up on September 29.  Subjective:   Chief Complaint: Follow-up of Crohn's disease depression  HPI The patient is here with her son [name] [name], for follow-up of Crohn's disease and recent problems with depressive symptoms.  She was having some failure to thrive issues and I had her go to the hospital where she was admitted.  I communicated  with the hospitalist, she was having some pain and some rectal bleeding which she will have off and on and they thought she was having a Crohn's flare but I have been following her lately and noticed a change in memory and depressed mood.  Discussions with family confirmed this.  She was started on fluoxetine 10 mg.  That does not seem like it is made much of a difference in the 3 to 4 weeks she has been on it.  She still sleeps a lot, she is not eating much and she is losing weight.  She is getting some home health, but the family is concerned about leaving her alone.  She is not driving and they want me to reminder not to.  She has misrepresented or not remembered things that she was to do or is asked about.  She has not had any falls to speak of.  She seems to be ambulating with assistance okay.  PT and OT have started.  Or about 2.  She is not having rectal bleeding now sometimes has diarrhea.  Appetite is significantly off still.    Wt Readings from Last 3 Encounters:  03/03/19 146 lb (66.2 kg)  01/28/19 153 lb 7 oz (69.6 kg)  01/22/19 176 lb (79.8 kg)   She has had a rising alkaline phosphatase and has a positive mitochondrial antibody   Son also asking about the possibility of Medicaid.  He said that he had information from [facility] home that she had diagnoses of liver disease and elevated ammonia level.  I  have reviewed the and they did report hepatic insufficiency with elevated serum ammonia level.  He has been treated with some lactulose. Allergies  Allergen Reactions  . Azathioprine Nausea Only    Elevated lipase also - ? Pancreatitis   . Entyvio [Vedolizumab] Shortness Of Breath  . Adhesive [Tape] Rash    Taking skin off  . Sulfa Antibiotics Itching and Rash   Current Meds  Medication Sig  . acetaminophen (TYLENOL) 325 MG tablet Take 650 mg by mouth every 6 (six) hours as needed for moderate pain.  Marland Kitchen amLODipine (NORVASC) 10 MG tablet Take 1 tablet (10 mg total) by mouth  daily.  Marland Kitchen dicyclomine (BENTYL) 20 MG tablet Take 1 tablet (20 mg total) by mouth 3 (three) times daily as needed for spasms (abdominal pain).  Marland Kitchen FLUoxetine (PROZAC) 10 MG capsule Take 1 capsule (10 mg total) by mouth daily.  . Multiple Vitamin (MULTIVITAMIN WITH MINERALS) TABS tablet Take 1 tablet by mouth daily.  . pantoprazole (PROTONIX) 40 MG tablet Take 1 tablet (40 mg total) by mouth daily.  . predniSONE (DELTASONE) 10 MG tablet Take 20 mg by mouth 2 (two) times daily with a meal.   Past Medical History:  Diagnosis Date  . Allergy    year around allergies   . Arthritis    shoulder  . Bell's palsy   . Blood transfusion    1966  . Blood transfusion without reported diagnosis   . Cataract    bilaterally removed  . Colon polyps    adenomatous polyp June 2012  . Complication of anesthesia    slow to wake  . Crohn's colitis ([place])   . Dry eyes    left eye worst one  . GERD (gastroesophageal reflux disease)   . Headache(784.0)    hx migraines  . Hypertension   . Protein-calorie malnutrition ([place]) 03/06/2019  . Severe major depression ([place]) 03/03/2019  . Shortness of breath    Past Surgical History:  Procedure Laterality Date  . ABDOMINAL HYSTERECTOMY    . APPENDECTOMY    . CATARACT EXTRACTION Bilateral    Separate dates  . CHOLECYSTECTOMY    . COLONOSCOPY  multiple  . flex sigmoidoscopy with biospy  01/17/12  . FLEXIBLE SIGMOIDOSCOPY  06/08/2011   severe Crohn's colitis to descending colon at least  . FOOT GANGLION EXCISION     left  . SHOULDER SURGERY     right  . TONSILLECTOMY     Social History   Social History Narrative   The patient is retired and widowed.  She has 3 children and grandchildren.   Second home in [place] she does not go there much anymore.   She is a former smoker but does not use alcohol or tobacco or drugs.   4 dogs   family history includes Breast cancer in her paternal aunt; Heart attack in her father; Heart disease in her brother,  father, and mother; Leukemia in her maternal grandfather and mother; Ovarian cancer in her paternal aunt; Parkinsonism in her brother; Throat cancer in her paternal aunt.   Review of Systems See HPI  Objective:   Physical Exam BP (!) 150/90   Pulse 100   Temp 97.7 F (36.5 C) (Oral)   Ht 5' 6"  (1.676 m)   Wt 146 lb (66.2 kg)   BMI 23.57 kg/m  No acute distress cushingoid elderly white woman Eyes anicteric Lungs clear Normal heart sounds Abdomen is soft and nontender Extremities no cyanosis or clubbing She  ambulates slowly with assistance Spine is somewhat kyphotic She is awake and alert and oriented to person but place and time not so well could not tell me the president could not tell me who her primary care provider was and I think she may be confabulating some slightly No asterixis  Depression scoring PHQ 9 17 she is not suicidal or homicidal CT scan in July on the seventh showed left colitis consistent with her known Crohn's colitis  Data reviewed includes hospitalizations labs recent other notes.

## 2019-03-03 NOTE — Assessment & Plan Note (Addendum)
Need to start therapy with ursodiol.  300 mg twice daily hopefully will not cause diarrhea.  Liver function is otherwise good nothing concerning on imaging. I have explained the nature and course of this illness to some degree to the patient and her son today. I did not recheck ammonia No asterixis I do not think encephalopathic

## 2019-03-03 NOTE — Patient Instructions (Addendum)
Your provider has requested that you go to the basement level for lab work before leaving today. Press "B" on the elevator. The lab is located at the first door on the left as you exit the elevator.   We have sent the following medications to your pharmacy for you to pick up at your convenience: Ursodiol, remeron  We are going to get your records for review from [facility].   Do Not Drive per Dr Carlean Purl.   Dr Carlean Purl is going to check into [facility] home care.   Please have [name] fax [place] paperwork he has to [phone].   Please follow up with [place] on 04/21/2019 at 11:10AM.   I appreciate the opportunity to care for you. [name], MD, [facility]

## 2019-03-03 NOTE — Assessment & Plan Note (Addendum)
I think this is her most significant issue right now  I will start mirtazapine 7.5 mg nightly which should help her sleep better at night and stimulate appetite I hope. Increase fluoxetine versus increase mirtazapine depending upon clinical course and follow-up. We will look in to try to facilitate further home care perhaps.  T HN did evaluate her but I think they did not appreciate her depression and associated memory disturbance and may have gotten the wrong idea about her situation.

## 2019-03-05 DIAGNOSIS — M1612 Unilateral primary osteoarthritis, left hip: Secondary | ICD-10-CM | POA: Diagnosis not present

## 2019-03-05 DIAGNOSIS — I10 Essential (primary) hypertension: Secondary | ICD-10-CM | POA: Diagnosis not present

## 2019-03-05 DIAGNOSIS — K50918 Crohn's disease, unspecified, with other complication: Secondary | ICD-10-CM | POA: Diagnosis not present

## 2019-03-05 DIAGNOSIS — F329 Major depressive disorder, single episode, unspecified: Secondary | ICD-10-CM | POA: Diagnosis not present

## 2019-03-05 DIAGNOSIS — Z7952 Long term (current) use of systemic steroids: Secondary | ICD-10-CM | POA: Diagnosis not present

## 2019-03-05 DIAGNOSIS — K219 Gastro-esophageal reflux disease without esophagitis: Secondary | ICD-10-CM | POA: Diagnosis not present

## 2019-03-06 ENCOUNTER — Encounter: Payer: Self-pay | Admitting: Internal Medicine

## 2019-03-06 DIAGNOSIS — E46 Unspecified protein-calorie malnutrition: Secondary | ICD-10-CM

## 2019-03-06 HISTORY — DX: Unspecified protein-calorie malnutrition: E46

## 2019-03-06 NOTE — Assessment & Plan Note (Signed)
This was severe when she was in the hospital most likely and remains an issue I think due to her inadequate intake which I think is primarily a function of her depression.  Hopefully this will improve with treatment of her depression.

## 2019-03-09 DIAGNOSIS — F329 Major depressive disorder, single episode, unspecified: Secondary | ICD-10-CM | POA: Diagnosis not present

## 2019-03-09 DIAGNOSIS — M1612 Unilateral primary osteoarthritis, left hip: Secondary | ICD-10-CM | POA: Diagnosis not present

## 2019-03-09 DIAGNOSIS — I10 Essential (primary) hypertension: Secondary | ICD-10-CM | POA: Diagnosis not present

## 2019-03-09 DIAGNOSIS — K219 Gastro-esophageal reflux disease without esophagitis: Secondary | ICD-10-CM | POA: Diagnosis not present

## 2019-03-09 DIAGNOSIS — K50918 Crohn's disease, unspecified, with other complication: Secondary | ICD-10-CM | POA: Diagnosis not present

## 2019-03-09 DIAGNOSIS — Z7952 Long term (current) use of systemic steroids: Secondary | ICD-10-CM | POA: Diagnosis not present

## 2019-03-09 NOTE — Progress Notes (Signed)
Labs were ok  The abnormalities like high white blood cells are expected on the prednisone  The alkaline phosphatase is high due to the liver problem I discussed - we have started medication for this.  I hope the new medication mirtazapine (bedtime) is helping some with sleep and appetite - if not it may still.  I have communicated with the [facility] organization to look into more help.  [name], MD, [name]

## 2019-03-10 ENCOUNTER — Other Ambulatory Visit: Payer: Self-pay

## 2019-03-10 NOTE — Patient Outreach (Signed)
[facility]) Care Management  03/10/2019  [place] [birth year] [id number]  TELEPHONE SCREENING Referral date: 03/06/19 Referral source: gastroenterologist Referral reason: assess for additional needs. Insurance: Medicare  Telephone call to patient regarding specialist visit. Spoke with son / designated party release, [employer]. Son reports patient currently lives with him and his wife. Son states they would like to see if patient will qualify for Medicaid so that she can receive assistance with her care. He states they would like to keep patient at home but would like to know the steps to take if patient needs to go to a nursing home.     Son states patient is not helping herself.  He states she lays around and does not do anything. Son states patient does not want to bath and is not eating well.  He states patient does not have a good appetite. Son reports patient has had occasional bowel/bladder incontinence. Son states patient is home approximately 10-12 hours during the day alone because he and his wife work. Son reports patient is currently on medication for depression but does not see a psychiatrist or therapist at this time.  Son states patient, "lives in the moment." He states patient appears to have issues with her short term memory.  Son states patient is currently receiving home health care for nursing and physical therapy. Son reports  patient is unable to afford a newly prescribed medication ursodiol.  Son states patient is not currently taking the medication.    RNCM discussed and offered [facility] care management services.  Son verbally agreed to follow up for patient with [facility] social worker and pharmacist.   ASSESSMENT:  Per chart diagnosis: Primary biliary Cholangitis Crohn's disease. Severe Major depression Protein Calorie malnutrition  Patient will benefit from referral to social worker for Medicaid application assistance, community resources, and  assistance for depression follow up.  Patient will benefit from referral to [facility] pharmacist for medication assistance.   PLAN:  RNCM will refer patient to [facility] social worker and pharmacist.    Quinn Plowman RN,BSN,CCM [facility] Telephonic  [phone]

## 2019-03-11 DIAGNOSIS — K219 Gastro-esophageal reflux disease without esophagitis: Secondary | ICD-10-CM | POA: Diagnosis not present

## 2019-03-11 DIAGNOSIS — M1612 Unilateral primary osteoarthritis, left hip: Secondary | ICD-10-CM | POA: Diagnosis not present

## 2019-03-11 DIAGNOSIS — Z7952 Long term (current) use of systemic steroids: Secondary | ICD-10-CM | POA: Diagnosis not present

## 2019-03-11 DIAGNOSIS — K50918 Crohn's disease, unspecified, with other complication: Secondary | ICD-10-CM | POA: Diagnosis not present

## 2019-03-11 DIAGNOSIS — I10 Essential (primary) hypertension: Secondary | ICD-10-CM | POA: Diagnosis not present

## 2019-03-11 DIAGNOSIS — F329 Major depressive disorder, single episode, unspecified: Secondary | ICD-10-CM | POA: Diagnosis not present

## 2019-03-12 ENCOUNTER — Other Ambulatory Visit: Payer: Self-pay | Admitting: *Deleted

## 2019-03-12 ENCOUNTER — Other Ambulatory Visit: Payer: Self-pay | Admitting: Pharmacist

## 2019-03-12 NOTE — Patient Outreach (Signed)
[place] [facility]) Care Management  03/12/2019  [name] [name] Sep 16, 1940 [id number]   CSW was able to make initial contact with pt by phone and confirmed her identity. CSW introduced self, role and reason for call.  Pt shared with CSW that she lives with her son and daughter in law and that her son is her 58.  Per pt, she is independent and able to care for self and also drives.  CSW inquired about any depression to which she denies yet states, "everybody does from time to time".  She denies any history or formal diagnosis of depression as well as never being SI/HI or taking medication for depression.   Pt gave CSW permission to speak with her son and she had him talk with CSW. Per son, she does need help with ADL's and states, "she tells everybody she isn't depressed".  CSW advised son of plans to call pt again and complete depression screening.  Son is interested in getting some help for pt in the home as well as seeing if pt may be eligible for Medicaid which CSW advised her income is too high ($1700). CSW explained to son if she requires a facility placement at some time she may be eligible for long term care Medicaid support.    CSW will ask Amber Chrismon, BSW, to follow up with Medicaid application, [facility] referral and other support/services as they may identify.   CSW will follow up with pt for depression screening later this week or next.   Eduard Clos, MSW, [place] Worker  [place] [phone]

## 2019-03-12 NOTE — Patient Outreach (Signed)
[place] [facility]) Care Management  [place]   03/12/2019  [place] 1940/12/27 [id number]  Reason for referral: Medication Assistance with Ursodiol  Referral source: GI Dr. Carlean Purl Current insurance: Unknown  PMHx includes but not limited to:  Biliary cholangitis, Crohn's disease, depression, hx Bell's palsy, GERD, HA, HTN, arthritis, allergies  Outreach:  Successful telephone call with Ms. Koeneman.  HIPAA identifiers verified. Patient states her son, [name], fills a pillbox out for her each week and helps her manage her medications as she has memory issues.  She does not know the names of her medications.  She gives permission for me to speak with [name].    Successful outreach with [employer].  HIPAA identifiers verified. Son reports patient has been tried "on so many different medications" for her Crohns.  Office did not have samples of medication for patient to trial.  He reports he was told medication was >$300 and he cannot afford this. He does not know what Medicare Part D plan that patient has, only states she has a "supplement."  Per review of documents, patient has "[employer] Supplement F" for Medicare A/B.  No Medicare Part D on file.  Son unable to review medications as he is now at home right now.   Objective: The 10-year ASCVD risk score Mikey Bussing DC Brooke Bonito., et al., 2013) is: 33.4%   Values used to calculate the score:     Age: 78 years     Sex: Female     Is Non-Hispanic African American: No     Diabetic: No     Tobacco smoker: No     Systolic Blood Pressure: 756 mmHg     Is BP treated: Yes     HDL Cholesterol: 52 mg/dL     Total Cholesterol: 228 mg/dL  Lab Results  Component Value Date   CREATININE 1.28 (H) 03/03/2019   CREATININE 1.19 (H) 01/29/2019   CREATININE 1.36 (H) 01/27/2019    Lab Results  Component Value Date   HGBA1C 6.5 10/31/2017    Lipid Panel     Component Value Date/Time   CHOL 228 (H) 10/31/2017 1344   TRIG 221.0  (H) 10/31/2017 1344   HDL 52.00 10/31/2017 1344   CHOLHDL 4 10/31/2017 1344   VLDL 44.2 (H) 10/31/2017 1344   LDLCALC (H) 07/03/2010 0407    137        Total Cholesterol/HDL:CHD Risk Coronary Heart Disease Risk Table                     Men   Women  1/2 Average Risk   3.4   3.3  Average Risk       5.0   4.4  2 X Average Risk   9.6   7.1  3 X Average Risk  23.4   11.0        Use the calculated Patient Ratio above and the CHD Risk Table to determine the patient's CHD Risk.        ATP III CLASSIFICATION (LDL):  <100     mg/dL   Optimal  100-129  mg/dL   Near or Above                    Optimal  130-159  mg/dL   Borderline  160-189  mg/dL   High  >190     mg/dL   Very High   LDLDIRECT 156.0 10/31/2017 1344    BP Readings from Last  3 Encounters:  03/03/19 (!) 150/90  02/02/19 140/82  08/06/18 124/90    Allergies  Allergen Reactions  . Azathioprine Nausea Only    Elevated lipase also - ? Pancreatitis   . Entyvio [Vedolizumab] Shortness Of Breath  . Adhesive [Tape] Rash    Taking skin off  . Sulfa Antibiotics Itching and Rash    Medications Reviewed Today    Reviewed by Tiana Loft, CMA (Certified Medical Assistant) on 03/03/19 at 1101  Med List Status: <None>  Medication Order Taking? Sig Documenting Provider Last Dose Status Informant  acetaminophen (TYLENOL) 325 MG tablet [id number] Yes Take 650 mg by mouth every 6 (six) hours as needed for moderate pain. [provider] Taking Active Self  amLODipine (NORVASC) 10 MG tablet [id number] Yes Take 1 tablet (10 mg total) by mouth daily. Barb Merino, MD Taking Active   dicyclomine (BENTYL) 20 MG tablet [id number] Yes Take 1 tablet (20 mg total) by mouth 3 (three) times daily as needed for spasms (abdominal pain). [name], MD Taking Active Self  FLUoxetine (PROZAC) 10 MG capsule [id number] Yes Take 1 capsule (10 mg total) by mouth daily. Barb Merino, MD Taking Active   Multiple Vitamin (MULTIVITAMIN  WITH MINERALS) TABS tablet [id number] Yes Take 1 tablet by mouth daily. [provider] Taking Active Self  pantoprazole (PROTONIX) 40 MG tablet [id number] Yes Take 1 tablet (40 mg total) by mouth daily. Barb Merino, MD Taking Active   predniSONE (DELTASONE) 10 MG tablet [id number] Yes Take 20 mg by mouth 2 (two) times daily with a meal. [provider] Taking Active Self          Assessment: Medication Assistance Findings:  Medication assistance needs identified: Ursodiol   [facility] Per pharmacist, Ursodiol RX is ready.  #90 DS $375.60 / #30 DS $125.49.  Not able to identify which Medicare Part D plan that patient has.  Extra Help:  Not eligible for Extra Help Low Income Subsidy based on reported income and assets  Patient Assistance Programs: 2 brand names for Ursodiol: Actigall and Urso.  -Actigall: No patient assistance programs identified -[place]so:  Patient not eligible for patient assistance due to having Medicare Part D  Unable to determine if medication is non-formulary or high Tier due to not having specific Medicare Part D on file.   Updated son on information.  He reports he will be able to pay for the 30 DS and will reach out to me if he finds out more information on Part D program.   Plan: . Will close [facility] pharmacy case as no further medication needs identified at this time.  Am happy to assist in the future as needed.     Ralene Bathe, PharmD, [place] [phone]

## 2019-03-13 ENCOUNTER — Other Ambulatory Visit: Payer: Self-pay | Admitting: *Deleted

## 2019-03-13 DIAGNOSIS — Z7952 Long term (current) use of systemic steroids: Secondary | ICD-10-CM | POA: Diagnosis not present

## 2019-03-13 DIAGNOSIS — F329 Major depressive disorder, single episode, unspecified: Secondary | ICD-10-CM | POA: Diagnosis not present

## 2019-03-13 DIAGNOSIS — M1612 Unilateral primary osteoarthritis, left hip: Secondary | ICD-10-CM | POA: Diagnosis not present

## 2019-03-13 DIAGNOSIS — K50918 Crohn's disease, unspecified, with other complication: Secondary | ICD-10-CM | POA: Diagnosis not present

## 2019-03-13 DIAGNOSIS — K219 Gastro-esophageal reflux disease without esophagitis: Secondary | ICD-10-CM | POA: Diagnosis not present

## 2019-03-13 DIAGNOSIS — I10 Essential (primary) hypertension: Secondary | ICD-10-CM | POA: Diagnosis not present

## 2019-03-13 NOTE — Patient Outreach (Signed)
[place] [facility]) Care Management  03/13/2019  [place] 1941/02/28 [id number]   CSW spoke with pt again and completed depression screening. Pt denies feeling depressed; states, "everyone feels that way from time to time".  She denies any significant current or past mental health issues. She lost her husband of almost 44 years in 2010 and will call a family member or friend when she needs a "pick me up".  CSW completed depression screening with pt to which she scored low risk (4).  She denies any needs related to this.  Pt shared with CSW that she enjoys "messing around" and gardening.  She goes out to eat a lot with her family and has a sister she does lots with; "I don't have any boring days".  CSW offered support and to call again in 1-2 weeks.   Eduard Clos, MSW, [place] Worker  [place] [phone]

## 2019-03-16 DIAGNOSIS — K50918 Crohn's disease, unspecified, with other complication: Secondary | ICD-10-CM | POA: Diagnosis not present

## 2019-03-16 DIAGNOSIS — F329 Major depressive disorder, single episode, unspecified: Secondary | ICD-10-CM | POA: Diagnosis not present

## 2019-03-16 DIAGNOSIS — K219 Gastro-esophageal reflux disease without esophagitis: Secondary | ICD-10-CM | POA: Diagnosis not present

## 2019-03-16 DIAGNOSIS — I10 Essential (primary) hypertension: Secondary | ICD-10-CM | POA: Diagnosis not present

## 2019-03-16 DIAGNOSIS — M1612 Unilateral primary osteoarthritis, left hip: Secondary | ICD-10-CM | POA: Diagnosis not present

## 2019-03-16 DIAGNOSIS — Z7952 Long term (current) use of systemic steroids: Secondary | ICD-10-CM | POA: Diagnosis not present

## 2019-03-16 NOTE — Telephone Encounter (Signed)
This encounter was created in error - please disregard.

## 2019-03-18 ENCOUNTER — Telehealth: Payer: Self-pay

## 2019-03-18 DIAGNOSIS — M1612 Unilateral primary osteoarthritis, left hip: Secondary | ICD-10-CM | POA: Diagnosis not present

## 2019-03-18 DIAGNOSIS — K50918 Crohn's disease, unspecified, with other complication: Secondary | ICD-10-CM | POA: Diagnosis not present

## 2019-03-18 DIAGNOSIS — K219 Gastro-esophageal reflux disease without esophagitis: Secondary | ICD-10-CM | POA: Diagnosis not present

## 2019-03-18 DIAGNOSIS — F329 Major depressive disorder, single episode, unspecified: Secondary | ICD-10-CM | POA: Diagnosis not present

## 2019-03-18 DIAGNOSIS — I10 Essential (primary) hypertension: Secondary | ICD-10-CM | POA: Diagnosis not present

## 2019-03-18 DIAGNOSIS — Z7952 Long term (current) use of systemic steroids: Secondary | ICD-10-CM | POA: Diagnosis not present

## 2019-03-18 NOTE — Telephone Encounter (Signed)
Copied from [place] [phone]. Topic: General - Inquiry >> Mar 18, 2019  9:52 AM Richardo Priest, NT wrote: Reason for CRM: Ms.Kidd called in stating she would like to inform PCP that patient's BP was 160/100, however she had not taken BP medication yet. Patient will retake and then they shall continue with OT evaluation. Call back is [phone].

## 2019-03-18 NOTE — Telephone Encounter (Signed)
fyi

## 2019-03-19 ENCOUNTER — Other Ambulatory Visit: Payer: Self-pay

## 2019-03-19 NOTE — Patient Outreach (Addendum)
[name] [name]  03/19/2019  [name] [name] 1941-07-15 [id number]   In-basket message received from [place], [name] [name].  "Can you please follow up with this pt's son/HCPOA to provide info/assistance with Medicaid (application and/or info on types of eligibility for home vs NH) and link with any in home support services ([facility]?) for ADL assist." Contacted son, [name] [name], yesterday but he was unable to talk and asked for a call back.  Attempted to reach today but had to leave voicemail message.  Will attempt to reach again within four business days.   Addendum: Received return call from patient's son, [name] [name], and daughter-in-law, [name] [name].  They reported the following concerns:  Patient refusing to take regular showers/maintain hygiene, poor diet choices, lack of physical activity/socialization, occasional short-term memory loss.  Family stated that they are not sure if this is related to possible depression/mental health issues or if cognitive.  Discussed possible evaluation by a neurologist and informed them that a referral is likely needed. Family stated they will talk with her MD about this. Discussed various levels of care for patient.  Son reports that they would like for her to remain in the home for as long as possible but he acknowledges the toll it is taking on him and his spouse as caregivers.  Son stated that patient recently turned oven on and forgot about it so there are some safety concerns as well.   Family would like to have in-home aide services to assist patient.  Patient is over the income limit to qualify for Medicaid and family cannot afford to privately pay for  in-home aide services.  Consented to a  referral being submitted to [employer].  They are aware that an assessment will need to be schedule to determine eligibility.   Although family would like for patient to remain in the home, they have considered placement.  However,  they acknowledge that patient may not be open to this. They would like to have a family discussion with patient about current situation/options.  Will collaborate with CSW, [name] [name], and request that family session be scheduled.  Family is aware that this will have to take place via phone due to home visits not being conducted at this time.  They did request that Medicaid application be mailed in case the decision is made in the future to apply for long-term care Medicaid.   Will follow up with son and his spouse as soon as possible regarding referral to [facility].    [name] [name], BSW Social Worker [phone]                                   [name] [name], [place] Social Worker [phone]

## 2019-03-20 ENCOUNTER — Other Ambulatory Visit: Payer: Self-pay

## 2019-03-20 NOTE — Patient Outreach (Signed)
[place] [facility]) Care Management  03/20/2019  [name] [name] 1940-10-29 [id number]   Left voicemail message for [name] with [facility] regarding referral for [facility].  Collaborated with CSW, Eduard Clos, regarding son's request for family session.  Marcie Bal will contact son or daughter-in-law to schedule session. Will follow up with patient's son when response received from [facility].    Ronn Melena, BSW Social Worker [phone]

## 2019-03-21 DIAGNOSIS — F329 Major depressive disorder, single episode, unspecified: Secondary | ICD-10-CM | POA: Diagnosis not present

## 2019-03-21 DIAGNOSIS — Z7952 Long term (current) use of systemic steroids: Secondary | ICD-10-CM | POA: Diagnosis not present

## 2019-03-21 DIAGNOSIS — Z9049 Acquired absence of other specified parts of digestive tract: Secondary | ICD-10-CM | POA: Diagnosis not present

## 2019-03-21 DIAGNOSIS — R41841 Cognitive communication deficit: Secondary | ICD-10-CM | POA: Diagnosis not present

## 2019-03-21 DIAGNOSIS — M19012 Primary osteoarthritis, left shoulder: Secondary | ICD-10-CM | POA: Diagnosis not present

## 2019-03-21 DIAGNOSIS — M545 Low back pain: Secondary | ICD-10-CM | POA: Diagnosis not present

## 2019-03-21 DIAGNOSIS — E876 Hypokalemia: Secondary | ICD-10-CM | POA: Diagnosis not present

## 2019-03-21 DIAGNOSIS — M810 Age-related osteoporosis without current pathological fracture: Secondary | ICD-10-CM | POA: Diagnosis not present

## 2019-03-21 DIAGNOSIS — Z87891 Personal history of nicotine dependence: Secondary | ICD-10-CM | POA: Diagnosis not present

## 2019-03-21 DIAGNOSIS — Z9181 History of falling: Secondary | ICD-10-CM | POA: Diagnosis not present

## 2019-03-21 DIAGNOSIS — E43 Unspecified severe protein-calorie malnutrition: Secondary | ICD-10-CM | POA: Diagnosis not present

## 2019-03-21 DIAGNOSIS — R1310 Dysphagia, unspecified: Secondary | ICD-10-CM | POA: Diagnosis not present

## 2019-03-21 DIAGNOSIS — H04123 Dry eye syndrome of bilateral lacrimal glands: Secondary | ICD-10-CM | POA: Diagnosis not present

## 2019-03-21 DIAGNOSIS — K219 Gastro-esophageal reflux disease without esophagitis: Secondary | ICD-10-CM | POA: Diagnosis not present

## 2019-03-21 DIAGNOSIS — K50918 Crohn's disease, unspecified, with other complication: Secondary | ICD-10-CM | POA: Diagnosis not present

## 2019-03-21 DIAGNOSIS — G510 Bell's palsy: Secondary | ICD-10-CM | POA: Diagnosis not present

## 2019-03-21 DIAGNOSIS — K529 Noninfective gastroenteritis and colitis, unspecified: Secondary | ICD-10-CM | POA: Diagnosis not present

## 2019-03-21 DIAGNOSIS — K7290 Hepatic failure, unspecified without coma: Secondary | ICD-10-CM | POA: Diagnosis not present

## 2019-03-21 DIAGNOSIS — I10 Essential (primary) hypertension: Secondary | ICD-10-CM | POA: Diagnosis not present

## 2019-03-21 DIAGNOSIS — M19011 Primary osteoarthritis, right shoulder: Secondary | ICD-10-CM | POA: Diagnosis not present

## 2019-03-21 DIAGNOSIS — G8929 Other chronic pain: Secondary | ICD-10-CM | POA: Diagnosis not present

## 2019-03-21 DIAGNOSIS — D649 Anemia, unspecified: Secondary | ICD-10-CM | POA: Diagnosis not present

## 2019-03-21 DIAGNOSIS — M1612 Unilateral primary osteoarthritis, left hip: Secondary | ICD-10-CM | POA: Diagnosis not present

## 2019-03-23 ENCOUNTER — Ambulatory Visit: Payer: Medicare Other

## 2019-03-23 ENCOUNTER — Ambulatory Visit: Payer: Self-pay | Admitting: *Deleted

## 2019-03-23 ENCOUNTER — Other Ambulatory Visit: Payer: Self-pay | Admitting: *Deleted

## 2019-03-23 NOTE — Patient Outreach (Signed)
[facility]) Care Management  03/23/2019  [name] [name] October 21, 1940 [id number]   CSW attempted to reach pt/family today and left a HIPPA compliant voice message for son.  CSW will await callback or try again in 3-4 business days.   Eduard Clos, MSW, [place] Worker  [place] [phone]

## 2019-03-24 ENCOUNTER — Other Ambulatory Visit: Payer: Self-pay

## 2019-03-24 DIAGNOSIS — Z7952 Long term (current) use of systemic steroids: Secondary | ICD-10-CM | POA: Diagnosis not present

## 2019-03-24 DIAGNOSIS — I10 Essential (primary) hypertension: Secondary | ICD-10-CM | POA: Diagnosis not present

## 2019-03-24 DIAGNOSIS — K219 Gastro-esophageal reflux disease without esophagitis: Secondary | ICD-10-CM | POA: Diagnosis not present

## 2019-03-24 DIAGNOSIS — K50918 Crohn's disease, unspecified, with other complication: Secondary | ICD-10-CM | POA: Diagnosis not present

## 2019-03-24 DIAGNOSIS — M1612 Unilateral primary osteoarthritis, left hip: Secondary | ICD-10-CM | POA: Diagnosis not present

## 2019-03-24 DIAGNOSIS — F329 Major depressive disorder, single episode, unspecified: Secondary | ICD-10-CM | POA: Diagnosis not present

## 2019-03-24 NOTE — Patient Outreach (Signed)
[place] [facility]) Care Management  03/24/2019  [place] 1941/07/02 [id number]  Referral submitted to [facility] [facility].  Attempted to contact patient's, son, [name] to follow up but had to leave voicemail message.  Will attempt to reach again within four business days if no return call.  Ronn Melena, BSW Social Worker [phone]

## 2019-03-25 ENCOUNTER — Other Ambulatory Visit: Payer: Self-pay

## 2019-03-25 NOTE — Patient Outreach (Signed)
[facility]) Care Management  03/25/2019  [name] [name] 09-02-1940 [id number]   Successful outreach to patient's son, [name].  Informed him that referral was submitted to [facility] and he should be contacted soon regarding assessment.  Provided him with contact information for CSW, Eduard Clos, who attempted to reach him earlier this week about scheduling family session. Will follow up again within the next two weeks regarding status of referral to [facility].  Ronn Melena, BSW Social Worker [phone]

## 2019-03-26 ENCOUNTER — Other Ambulatory Visit: Payer: Self-pay | Admitting: *Deleted

## 2019-03-26 ENCOUNTER — Ambulatory Visit: Payer: Self-pay

## 2019-03-26 ENCOUNTER — Ambulatory Visit: Payer: Self-pay | Admitting: *Deleted

## 2019-03-26 ENCOUNTER — Encounter: Payer: Self-pay | Admitting: *Deleted

## 2019-03-26 DIAGNOSIS — F329 Major depressive disorder, single episode, unspecified: Secondary | ICD-10-CM | POA: Diagnosis not present

## 2019-03-26 DIAGNOSIS — K219 Gastro-esophageal reflux disease without esophagitis: Secondary | ICD-10-CM | POA: Diagnosis not present

## 2019-03-26 DIAGNOSIS — M1612 Unilateral primary osteoarthritis, left hip: Secondary | ICD-10-CM | POA: Diagnosis not present

## 2019-03-26 DIAGNOSIS — K50918 Crohn's disease, unspecified, with other complication: Secondary | ICD-10-CM | POA: Diagnosis not present

## 2019-03-26 DIAGNOSIS — Z7952 Long term (current) use of systemic steroids: Secondary | ICD-10-CM | POA: Diagnosis not present

## 2019-03-26 DIAGNOSIS — I10 Essential (primary) hypertension: Secondary | ICD-10-CM | POA: Diagnosis not present

## 2019-03-26 NOTE — Patient Outreach (Signed)
[facility]) Care Management  03/26/2019  Demitra Danley Ferrufino Dec 07, 1940 [id number]   CSW was unable to reach pt's son again today and did leave a HIPPA compliant voice message. CSW will send an unsuccessful outreach letter and if no return call will try again per protocol for 3 outreach attempts in 10 business days.   Eduard Clos, MSW, [place] Worker  [place] [phone]

## 2019-04-02 DIAGNOSIS — Z7952 Long term (current) use of systemic steroids: Secondary | ICD-10-CM | POA: Diagnosis not present

## 2019-04-02 DIAGNOSIS — M1612 Unilateral primary osteoarthritis, left hip: Secondary | ICD-10-CM | POA: Diagnosis not present

## 2019-04-02 DIAGNOSIS — K50918 Crohn's disease, unspecified, with other complication: Secondary | ICD-10-CM | POA: Diagnosis not present

## 2019-04-02 DIAGNOSIS — I10 Essential (primary) hypertension: Secondary | ICD-10-CM | POA: Diagnosis not present

## 2019-04-02 DIAGNOSIS — K219 Gastro-esophageal reflux disease without esophagitis: Secondary | ICD-10-CM | POA: Diagnosis not present

## 2019-04-02 DIAGNOSIS — F329 Major depressive disorder, single episode, unspecified: Secondary | ICD-10-CM | POA: Diagnosis not present

## 2019-04-03 ENCOUNTER — Other Ambulatory Visit: Payer: Self-pay | Admitting: *Deleted

## 2019-04-03 ENCOUNTER — Telehealth: Payer: Self-pay | Admitting: Internal Medicine

## 2019-04-03 MED ORDER — MIRTAZAPINE 15 MG PO TABS: 15.0000 mg | ORAL_TABLET | Freq: Every day | ORAL | 1 refills | Status: DC

## 2019-04-03 MED ORDER — URSODIOL 300 MG PO CAPS: 300.0000 mg | ORAL_CAPSULE | Freq: Two times a day (BID) | ORAL | 3 refills | Status: DC

## 2019-04-03 MED ORDER — PREDNISONE 10 MG PO TABS: 20.0000 mg | ORAL_TABLET | Freq: Every day | ORAL | 1 refills | Status: DC

## 2019-04-03 NOTE — Telephone Encounter (Signed)
Prednisone 10 mg tabs take 2 each day # 180 1 refill   Refill the ursodiol at current dosage and for 90 days and 3 RF  Change mirtazapine to 15 mg dose qhs # 90 1 refill

## 2019-04-03 NOTE — Patient Outreach (Signed)
[facility]) Care Management  04/03/2019  [name] [name] July 08, 1941 [id number]   CSW spoke with pt as well as her son by phone. Pt denies any depression; still comments that she has "some bad days" related to her chron's and not depression. She is planning an outing with her sister to go out to eat.  CSW also spoke with pt's son by phone who reports he feels she has depression "in ways that are noticed through her actions".  He shared with CSW that she lacks motivation and often acts like a 72 year old.  Per son, pt was evaluated by [facility]; [facility] and they will be providing assistance with light housekeeping, laundry and some personal care type support. Pt's son is hopeful this will offer more support and activity/interactions with others.   CSW will plan a follow up call for updates on [facility] and for further screening in the next 2 weeks.   Eduard Clos, MSW, [place] Worker  [place] [phone]

## 2019-04-03 NOTE — Telephone Encounter (Signed)
I spoke with Mardene Celeste and she said [name] [name] is eating better once she gets her to the table and she is sleeping much better and not as many naps during the day. She is on Prednisone 70m tablets taking 251mqd. She also needs a refill on her ursodiol per PaMardene CelesteShe said she is not as moody.

## 2019-04-03 NOTE — Telephone Encounter (Signed)
Call daughter-in-law and clarify prednisone dose she is on  Ask how its going with mirtazapine - I may increase the dose  Find out if she is eating better and how she is sleeping - too little, too much?

## 2019-04-03 NOTE — Telephone Encounter (Signed)
Mardene Celeste (daughter in law) informed that rx's have been sent in and that Dr Carlean Purl changed the mirtazapine to 18m.

## 2019-04-03 NOTE — Telephone Encounter (Signed)
Pt needs rf of prednisone sent to [facility] in [place].

## 2019-04-03 NOTE — Telephone Encounter (Signed)
Her pharmacy also sent a request for her mirtazapine 7.5 mg tablet to be refilled for a 90 day supply, please advise Sir? Thank you.

## 2019-04-08 ENCOUNTER — Ambulatory Visit: Payer: Self-pay

## 2019-04-08 ENCOUNTER — Other Ambulatory Visit: Payer: Self-pay

## 2019-04-08 DIAGNOSIS — K219 Gastro-esophageal reflux disease without esophagitis: Secondary | ICD-10-CM | POA: Diagnosis not present

## 2019-04-08 DIAGNOSIS — M1612 Unilateral primary osteoarthritis, left hip: Secondary | ICD-10-CM | POA: Diagnosis not present

## 2019-04-08 DIAGNOSIS — Z7952 Long term (current) use of systemic steroids: Secondary | ICD-10-CM | POA: Diagnosis not present

## 2019-04-08 DIAGNOSIS — I10 Essential (primary) hypertension: Secondary | ICD-10-CM | POA: Diagnosis not present

## 2019-04-08 DIAGNOSIS — F329 Major depressive disorder, single episode, unspecified: Secondary | ICD-10-CM | POA: Diagnosis not present

## 2019-04-08 DIAGNOSIS — K50918 Crohn's disease, unspecified, with other complication: Secondary | ICD-10-CM | POA: Diagnosis not present

## 2019-04-08 NOTE — Patient Outreach (Signed)
[facility]) Care Management  04/08/2019  Antonina Deziel Minchey Apr 26, 1941 [id number]   Unsuccessful follow up call to patient's son regarding status of in-home services with [facility].  Left voicemail message.  Will attempt to reach again within four business days.  Ronn Melena, BSW Social Worker [phone]

## 2019-04-09 ENCOUNTER — Other Ambulatory Visit: Payer: Self-pay

## 2019-04-09 ENCOUNTER — Ambulatory Visit: Payer: Self-pay

## 2019-04-09 NOTE — Patient Outreach (Signed)
[facility]) Care Management  04/09/2019  [name] [name] 04/12/1941 [id number]   Successful follow up call to patient's son today. He did confirm that patient is receiving in-home services from [employer].  BSW closing case but encourage him to call if additional resource needs arise.  Ronn Melena, BSW Social Worker [phone]

## 2019-04-14 ENCOUNTER — Other Ambulatory Visit: Payer: Self-pay | Admitting: *Deleted

## 2019-04-14 NOTE — Patient Outreach (Addendum)
[facility]) Care Management  04/14/2019  [name] [name] Jun 01, 1941 [id number]   CSW was able to speak with pt by phone today- she denies any concerns at this time and is enjoying the fall weather. "I am walking around outside some but haven't been anywhere". Pt denies any depression. She shared that she is getting bathing assistance through [facility] and is happy to have this additional support/help. CSW unable to reach son- will try again.   Eduard Clos, MSW, [place] Worker  [place] [phone]

## 2019-04-15 DIAGNOSIS — K50918 Crohn's disease, unspecified, with other complication: Secondary | ICD-10-CM | POA: Diagnosis not present

## 2019-04-15 DIAGNOSIS — M1612 Unilateral primary osteoarthritis, left hip: Secondary | ICD-10-CM | POA: Diagnosis not present

## 2019-04-15 DIAGNOSIS — I10 Essential (primary) hypertension: Secondary | ICD-10-CM | POA: Diagnosis not present

## 2019-04-15 DIAGNOSIS — K219 Gastro-esophageal reflux disease without esophagitis: Secondary | ICD-10-CM | POA: Diagnosis not present

## 2019-04-15 DIAGNOSIS — F329 Major depressive disorder, single episode, unspecified: Secondary | ICD-10-CM | POA: Diagnosis not present

## 2019-04-15 DIAGNOSIS — Z7952 Long term (current) use of systemic steroids: Secondary | ICD-10-CM | POA: Diagnosis not present

## 2019-04-21 ENCOUNTER — Other Ambulatory Visit (INDEPENDENT_AMBULATORY_CARE_PROVIDER_SITE_OTHER): Payer: Medicare Other

## 2019-04-21 ENCOUNTER — Encounter: Payer: Self-pay | Admitting: Internal Medicine

## 2019-04-21 ENCOUNTER — Ambulatory Visit (INDEPENDENT_AMBULATORY_CARE_PROVIDER_SITE_OTHER): Payer: Medicare Other | Admitting: Internal Medicine

## 2019-04-21 VITALS — BP 180/120 | HR 76 | Temp 98.0°F | Ht 66.0 in | Wt 160.0 lb

## 2019-04-21 DIAGNOSIS — K50111 Crohn's disease of large intestine with rectal bleeding: Secondary | ICD-10-CM

## 2019-04-21 DIAGNOSIS — I10 Essential (primary) hypertension: Secondary | ICD-10-CM

## 2019-04-21 DIAGNOSIS — I679 Cerebrovascular disease, unspecified: Secondary | ICD-10-CM | POA: Diagnosis not present

## 2019-04-21 DIAGNOSIS — F322 Major depressive disorder, single episode, severe without psychotic features: Secondary | ICD-10-CM

## 2019-04-21 DIAGNOSIS — R413 Other amnesia: Secondary | ICD-10-CM

## 2019-04-21 DIAGNOSIS — K743 Primary biliary cirrhosis: Secondary | ICD-10-CM

## 2019-04-21 LAB — COMPREHENSIVE METABOLIC PANEL
ALT: 26 U/L (ref 0–35)
AST: 19 U/L (ref 0–37)
Albumin: 4 g/dL (ref 3.5–5.2)
Alkaline Phosphatase: 121 U/L — ABNORMAL HIGH (ref 39–117)
BUN: 24 mg/dL — ABNORMAL HIGH (ref 6–23)
CO2: 24 mEq/L (ref 19–32)
Calcium: 10.1 mg/dL (ref 8.4–10.5)
Chloride: 99 mEq/L (ref 96–112)
Creatinine, Ser: 1.53 mg/dL — ABNORMAL HIGH (ref 0.40–1.20)
GFR: 32.87 mL/min — ABNORMAL LOW (ref >60.00)
Glucose, Bld: 144 mg/dL — ABNORMAL HIGH (ref 70–99)
Potassium: 4.3 mEq/L (ref 3.5–5.1)
Sodium: 135 mEq/L (ref 135–145)
Total Bilirubin: 0.5 mg/dL (ref 0.2–1.2)
Total Protein: 7.9 g/dL (ref 6.0–8.3)

## 2019-04-21 LAB — CBC WITH DIFFERENTIAL/PLATELET
Basophils Absolute: 0 10*3/uL (ref 0.0–0.1)
Basophils Relative: 0.2 % (ref 0.0–3.0)
Eosinophils Absolute: 0 10*3/uL (ref 0.0–0.7)
Eosinophils Relative: 0 % (ref 0.0–5.0)
HCT: 43.9 % (ref 36.0–46.0)
Hemoglobin: 14.3 g/dL (ref 12.0–15.0)
Lymphocytes Relative: 7.1 % — ABNORMAL LOW (ref 12.0–46.0)
Lymphs Abs: 1.5 10*3/uL (ref 0.7–4.0)
MCHC: 32.6 g/dL (ref 30.0–36.0)
MCV: 93.9 fl (ref 78.0–100.0)
Monocytes Absolute: 0.3 10*3/uL (ref 0.1–1.0)
Monocytes Relative: 1.4 % — ABNORMAL LOW (ref 3.0–12.0)
Neutro Abs: 18.7 10*3/uL — ABNORMAL HIGH (ref 1.4–7.7)
Neutrophils Relative %: 91.3 % — ABNORMAL HIGH (ref 43.0–77.0)
Platelets: 465 10*3/uL — ABNORMAL HIGH (ref 150.0–400.0)
RBC: 4.68 Mil/uL (ref 3.87–5.11)
RDW: 15.5 % (ref 11.5–15.5)
WBC: 20.5 10*3/uL (ref 4.0–10.5)

## 2019-04-21 MED ORDER — FLUOXETINE HCL 20 MG PO CAPS: 20.0000 mg | ORAL_CAPSULE | Freq: Every day | ORAL | 3 refills | Status: DC

## 2019-04-21 MED ORDER — HYDROCHLOROTHIAZIDE 25 MG PO TABS: 25.0000 mg | ORAL_TABLET | Freq: Every day | ORAL | 3 refills | Status: DC

## 2019-04-21 NOTE — Patient Instructions (Addendum)
I sent in prescriptions for fluoxetine and HCTZ  Please get those today and get started back on both today.  STOPPED DICYCLOMINE  Labs today also.  We have made you an appointment with Dr Sharlet Salina for 04/24/2019 at 1:20pm. They will call you before your appointment with a phone # to call when you get here.   I appreciate the opportunity to care for you. [name], MD, [facility]

## 2019-04-21 NOTE — Progress Notes (Signed)
Vallarie Fei Sydnor 78 y.o. 10/29/1940 [id number]  Assessment & Plan:   Crohn's disease of colon with rectal bleeding ([place]) Stable I think.  It is only really responded well to prednisone and will continue that.  Labs today.   Primary biliary cholangitis ([place]) Recheck labs and follow alkaline phosphatase.  Continue ursodiol. Part of fatigue issues as this illness causes fatigue.  Severe major depression ([place]) improved I think.  Continue mirtazapine I had increased the dose from 7.5 to 15 mg.  Her weight is up so I think this is working some.  Refill fluoxetine but put that at 20 mg not the 10 mg started by the hospitalist.  Hypertension Blood pressure is elevated.  Think she is on HCTZ right now, at any rate if she was the dose had been reduced when she was in the nursing home earlier this year.  Blood pressure is up, prescribe HCTZ 25 mg daily.  Follow-up with primary care as arranged for later this week.  Memory disturbance Did not do well on the cognitive test.  I think she is probably confabulating some difficulty tease out with the depression but suspect small vessel disease is the primary driver of this C94 TSH have been okay.  She will follow-up with primary care regarding this.  I will defer to them regarding neurology.  This is creating self-care issues however raised by her family.  They are trying to get home health help through [facility], they are on a waiting list.  Physical therapy and home health nurse have stopped coming against the benefits ran out.  Not sure what is possible for her but hopefully there is some sort of in-home care that can be gotten for her.  Agree with no driving and being cautious about the stove and oven etc. as the family members are.  I am discontinuing dicyclomine as that could make memory disturbance worse.  It is an anticholinergic.  Cerebrovascular small vessel disease She had had a fall and was in the ER in July, there was progression of  this and I suspect she has or is headed towards dementia related to small vessel disease though I am not ready to make a conclusive diagnosis we will see what primary care thinks.   We will make a follow-up appointment for her for early to mid November not done at the visit but we will contact her.  Subjective:   Chief Complaint: Follow-up of Crohn's disease and multiple other issues  HPI Jeanett Schlein is here for follow-up of her Crohn's disease and have also been assisting with care because of depression.  I received a note from her son and daughter-in-law indicating that she is still confused.  Home health is not coming.  She is not driving and is accepting of that.  They have been concerned about her burning something down so there turning the stove off when they leave the house and she is alone.  Concerned about blood pressure medication changes from 25 mg down to 12.5 mg on HCTZ.  She may be out of that medication as best I can tell.  She apparently lies around the house a lot and still has a lot of anhedonia.  Jeanett Schlein tells me that she is taking care of the house and cleaning up and has been to the movies with her sister (even though they are close during [place]), and has diarrhea and constipation at times.  Some lower abdominal pain.  No she needs a flu shot.  Is open to the idea of seeing primary care as requested by family.  Still sleeping quite a bit.  Feels tired as well.  Was not aware that I had diagnosed her with primary biliary cholangitis but knew she was on a different medication i.e. ursodiol.     Wt Readings from Last 3 Encounters:  04/21/19 160 lb (72.6 kg)  03/03/19 146 lb (66.2 kg)  01/28/19 153 lb 7 oz (69.6 kg)   Sister Jackelyn Poling came in at the end and participated.  Allergies  Allergen Reactions  . Azathioprine Nausea Only    Elevated lipase also - ? Pancreatitis   . Entyvio [Vedolizumab] Shortness Of Breath  . Adhesive [Tape] Rash    Taking skin off  . Sulfa  Antibiotics Itching and Rash   Current Meds  Medication Sig  . acetaminophen (TYLENOL) 325 MG tablet Take 650 mg by mouth every 6 (six) hours as needed for moderate pain.  Marland Kitchen amLODipine (NORVASC) 10 MG tablet Take 1 tablet (10 mg total) by mouth daily.  . mirtazapine (REMERON) 15 MG tablet Take 1 tablet (15 mg total) by mouth at bedtime.  . Multiple Vitamin (MULTIVITAMIN WITH MINERALS) TABS tablet Take 1 tablet by mouth daily.  . predniSONE (DELTASONE) 10 MG tablet Take 2 tablets (20 mg total) by mouth daily with breakfast.  . ursodiol (ACTIGALL) 300 MG capsule Take 1 capsule (300 mg total) by mouth 2 (two) times daily.  . [DISCONTINUED] dicyclomine (BENTYL) 20 MG tablet Take 1 tablet (20 mg total) by mouth 3 (three) times daily as needed for spasms (abdominal pain).   Past Medical History:  Diagnosis Date  . Allergy    year around allergies   . Arthritis    shoulder  . Bell's palsy   . Blood transfusion    1966  . Blood transfusion without reported diagnosis   . Cataract    bilaterally removed  . Colon polyps    adenomatous polyp June 2012  . Complication of anesthesia    slow to wake  . Crohn's colitis ([place])   . Dry eyes    left eye worst one  . GERD (gastroesophageal reflux disease)   . Headache(784.0)    hx migraines  . Hypertension   . Protein-calorie malnutrition ([place]) 03/06/2019  . Severe major depression ([place]) 03/03/2019  . Shortness of breath    Past Surgical History:  Procedure Laterality Date  . ABDOMINAL HYSTERECTOMY    . APPENDECTOMY    . CATARACT EXTRACTION Bilateral    Separate dates  . CHOLECYSTECTOMY    . COLONOSCOPY  multiple  . flex sigmoidoscopy with biospy  01/17/12  . FLEXIBLE SIGMOIDOSCOPY  06/08/2011   severe Crohn's colitis to descending colon at least  . FOOT GANGLION EXCISION     left  . SHOULDER SURGERY     right  . TONSILLECTOMY     Social History   Social History Narrative   The patient is retired and widowed.  She has 3 children  and grandchildren.   Second home in [place] she does not go there much anymore.   She is a former smoker but does not use alcohol or tobacco or drugs.   4 dogs   family history includes Breast cancer in her paternal aunt; Heart attack in her father; Heart disease in her brother, father, and mother; Leukemia in her maternal grandfather and mother; Ovarian cancer in her paternal aunt; Parkinsonism in her brother; Throat cancer in her paternal aunt.  Review of Systems  Chronic back pain persists not any worse not better Objective:   Physical Exam BP (!) 180/120   Pulse 76   Temp 98 F (36.7 C) (Oral)   Ht 5' 6"  (1.676 m)   Wt 160 lb (72.6 kg)   BMI 25.82 kg/m    BP recheck 160/100 Lungs dec bs Ht NL abd NT   PHQ9 score is 13 it was 17 on August 11 though her son was with her and I think that might of been more accurate.  CIT6 score is 15 out of 28

## 2019-04-21 NOTE — Assessment & Plan Note (Signed)
Blood pressure is elevated.  Think she is on HCTZ right now, at any rate if she was the dose had been reduced when she was in the nursing home earlier this year.  Blood pressure is up, prescribe HCTZ 25 mg daily.  Follow-up with primary care as arranged for later this week.

## 2019-04-21 NOTE — Assessment & Plan Note (Addendum)
Did not do well on the cognitive test.  I think she is probably confabulating some difficulty tease out with the depression but suspect small vessel disease is the primary driver of this J18 TSH have been okay.  She will follow-up with primary care regarding this.  I will defer to them regarding neurology.  This is creating self-care issues however raised by her family.  They are trying to get home health help through [facility], they are on a waiting list.  Physical therapy and home health nurse have stopped coming against the benefits ran out.  Not sure what is possible for her but hopefully there is some sort of in-home care that can be gotten for her.  Agree with no driving and being cautious about the stove and oven etc. as the family members are.  I am discontinuing dicyclomine as that could make memory disturbance worse.  It is an anticholinergic.

## 2019-04-21 NOTE — Assessment & Plan Note (Signed)
She had had a fall and was in the ER in July, there was progression of this and I suspect she has or is headed towards dementia related to small vessel disease though I am not ready to make a conclusive diagnosis we will see what primary care thinks.

## 2019-04-21 NOTE — Assessment & Plan Note (Addendum)
Recheck labs and follow alkaline phosphatase.  Continue ursodiol. Part of fatigue issues as this illness causes fatigue.

## 2019-04-21 NOTE — Assessment & Plan Note (Addendum)
Stable I think.  It is only really responded well to prednisone and will continue that.  Labs today.

## 2019-04-21 NOTE — Assessment & Plan Note (Addendum)
improved I think.  Continue mirtazapine I had increased the dose from 7.5 to 15 mg.  Her weight is up so I think this is working some.  Refill fluoxetine but put that at 20 mg not the 10 mg started by the hospitalist.

## 2019-04-22 ENCOUNTER — Other Ambulatory Visit: Payer: Self-pay | Admitting: *Deleted

## 2019-04-22 NOTE — Patient Outreach (Signed)
[place] [facility]) Care Management  04/22/2019  [place] [birth year] [id number]   CSW spoke with pt who shared about her visit to the doctor (yesterday say Dr Carlean Purl).  "I can't drive and some other things he told me to do or not do". Pt not able to elaborate on the other things but is understanding of the no driving rule.  She is accepting of "I can't do what I use to do" and was able to express the positives and strengths she does have.  "I can take care of myself and fix my own meals". She talked about going out to eat with her sister which she enjoys.  Pt minimizes her depression; stating "at times but we all have that".  Pt scored 12 on PHQ9 today; she admits to being more "lazy" than anything.    CSW offered support, education about depression and will plan outreach call again in 2 weeks.  Eduard Clos, MSW, [place] Worker  [place] [phone]

## 2019-04-23 NOTE — Progress Notes (Signed)
Discussed results w/ daughter-in-law  Creat sl increased WBC stable on prednisone Alk phos down on ursodiol  Await PCP eval on memory, additional thoughts on depression, f/u BP

## 2019-04-24 ENCOUNTER — Other Ambulatory Visit (INDEPENDENT_AMBULATORY_CARE_PROVIDER_SITE_OTHER): Payer: Medicare Other

## 2019-04-24 ENCOUNTER — Other Ambulatory Visit: Payer: Self-pay

## 2019-04-24 ENCOUNTER — Ambulatory Visit (INDEPENDENT_AMBULATORY_CARE_PROVIDER_SITE_OTHER): Payer: Medicare Other | Admitting: Internal Medicine

## 2019-04-24 ENCOUNTER — Encounter: Payer: Self-pay | Admitting: Internal Medicine

## 2019-04-24 VITALS — BP 160/110 | HR 72 | Temp 98.0°F | Ht 66.0 in | Wt 162.0 lb

## 2019-04-24 DIAGNOSIS — I10 Essential (primary) hypertension: Secondary | ICD-10-CM

## 2019-04-24 DIAGNOSIS — R739 Hyperglycemia, unspecified: Secondary | ICD-10-CM | POA: Diagnosis not present

## 2019-04-24 DIAGNOSIS — T50905A Adverse effect of unspecified drugs, medicaments and biological substances, initial encounter: Secondary | ICD-10-CM

## 2019-04-24 DIAGNOSIS — M8580 Other specified disorders of bone density and structure, unspecified site: Secondary | ICD-10-CM | POA: Diagnosis not present

## 2019-04-24 DIAGNOSIS — F0280 Dementia in other diseases classified elsewhere without behavioral disturbance: Secondary | ICD-10-CM | POA: Diagnosis not present

## 2019-04-24 DIAGNOSIS — F322 Major depressive disorder, single episode, severe without psychotic features: Secondary | ICD-10-CM | POA: Diagnosis not present

## 2019-04-24 DIAGNOSIS — Z23 Encounter for immunization: Secondary | ICD-10-CM

## 2019-04-24 DIAGNOSIS — G309 Alzheimer's disease, unspecified: Secondary | ICD-10-CM

## 2019-04-24 LAB — COMPREHENSIVE METABOLIC PANEL
ALT: 20 U/L (ref 0–35)
AST: 21 U/L (ref 0–37)
Albumin: 3.9 g/dL (ref 3.5–5.2)
Alkaline Phosphatase: 109 U/L (ref 39–117)
BUN: 27 mg/dL — ABNORMAL HIGH (ref 6–23)
CO2: 25 mEq/L (ref 19–32)
Calcium: 9.9 mg/dL (ref 8.4–10.5)
Chloride: 102 mEq/L (ref 96–112)
Creatinine, Ser: 1.47 mg/dL — ABNORMAL HIGH (ref 0.40–1.20)
GFR: 34.42 mL/min — ABNORMAL LOW (ref >60.00)
Glucose, Bld: 132 mg/dL — ABNORMAL HIGH (ref 70–99)
Potassium: 5.2 mEq/L — ABNORMAL HIGH (ref 3.5–5.1)
Sodium: 137 mEq/L (ref 135–145)
Total Bilirubin: 0.3 mg/dL (ref 0.2–1.2)
Total Protein: 7.6 g/dL (ref 6.0–8.3)

## 2019-04-24 LAB — CBC WITH DIFFERENTIAL/PLATELET
Basophils Absolute: 0 10*3/uL (ref 0.0–0.1)
Basophils Relative: 0.1 % (ref 0.0–3.0)
Eosinophils Absolute: 0 10*3/uL (ref 0.0–0.7)
Eosinophils Relative: 0 % (ref 0.0–5.0)
HCT: 40.4 % (ref 36.0–46.0)
Hemoglobin: 13.1 g/dL (ref 12.0–15.0)
Lymphocytes Relative: 6.6 % — ABNORMAL LOW (ref 12.0–46.0)
Lymphs Abs: 1.1 10*3/uL (ref 0.7–4.0)
MCHC: 32.5 g/dL (ref 30.0–36.0)
MCV: 94.1 fl (ref 78.0–100.0)
Monocytes Absolute: 0.3 10*3/uL (ref 0.1–1.0)
Monocytes Relative: 1.7 % — ABNORMAL LOW (ref 3.0–12.0)
Neutro Abs: 16 10*3/uL — ABNORMAL HIGH (ref 1.4–7.7)
Neutrophils Relative %: 91.6 % — ABNORMAL HIGH (ref 43.0–77.0)
Platelets: 446 10*3/uL — ABNORMAL HIGH (ref 150.0–400.0)
RBC: 4.29 Mil/uL (ref 3.87–5.11)
RDW: 15.6 % — ABNORMAL HIGH (ref 11.5–15.5)
WBC: 17.5 10*3/uL — ABNORMAL HIGH (ref 4.0–10.5)

## 2019-04-24 LAB — LIPID PANEL
Cholesterol: 272 mg/dL — ABNORMAL HIGH (ref 0–200)
HDL: 71.9 mg/dL (ref >39.00)
LDL Cholesterol: 167 mg/dL — ABNORMAL HIGH (ref 0–99)
NonHDL: 199.73
Total CHOL/HDL Ratio: 4
Triglycerides: 165 mg/dL — ABNORMAL HIGH (ref 0.0–149.0)
VLDL: 33 mg/dL (ref 0.0–40.0)

## 2019-04-24 LAB — HEMOGLOBIN A1C: Hgb A1c MFr Bld: 6.4 % (ref 4.6–6.5)

## 2019-04-24 MED ORDER — AMLODIPINE BESYLATE 10 MG PO TABS: 10.0000 mg | ORAL_TABLET | Freq: Every day | ORAL | 3 refills | Status: DC

## 2019-04-24 MED ORDER — TRIAMCINOLONE ACETONIDE 0.1 % EX CREA: 1.0000 "application " | TOPICAL_CREAM | Freq: Two times a day (BID) | CUTANEOUS | 3 refills | Status: DC

## 2019-04-24 MED ORDER — DONEPEZIL HCL 5 MG PO TABS: 5.0000 mg | ORAL_TABLET | Freq: Every day | ORAL | 0 refills | Status: DC

## 2019-04-24 NOTE — Assessment & Plan Note (Signed)
Checking HgA1c due to chronic steroids and lack of measurement recently.

## 2019-04-24 NOTE — Patient Instructions (Addendum)
You need a life alert or similar device if you are going to stay home alone. If not consider adult daycare for monitoring during the day.  We are doing your blood work today. We have sent in aricept for the memory to start with 5 mg daily. In 1 month call us and let us know if doing okay we will increase to 10 mg daily.

## 2019-04-24 NOTE — Assessment & Plan Note (Signed)
BP is very elevated and rechecking labs today. She will go back to amlodipine 10 mg daily and hctz 25 mg daily and needs follow up.

## 2019-04-24 NOTE — Progress Notes (Signed)
   Subjective:   Patient ID: [name] [name], female    DOB: Mar 14, 1941, 78 y.o.   MRN: [id number]  HPI The patient is a 78 YO female coming in for concerns about memory (family is concerned, she does not have interest in anything anymore and used to read a lot, CT scan from July when in hospital with moderate loss, leaving stove on and forgetting to the point where son now leaves this off when out of home, confusion with things so son does medicines now and other tasks, lives with son and daughter in law but they have found her on floor when returning home from a fall and she cannot get up many times, she does not keep phone on her and does not charge it, will not use life alert, was told by GI not to drive and she is honoring this) and blood pressure (out of meds for some time, they were changed in [place] and had not done well since that time, they do not now what changes were made then, wants to go back on prior regimen which worked fine, denies headaches or chest pains).   Review of Systems  Unable to perform ROS: Dementia  Constitutional: Positive for activity change and appetite change. Negative for fatigue, fever and unexpected weight change.  Respiratory: Negative.   Cardiovascular: Negative.   Gastrointestinal: Negative.   Musculoskeletal: Negative.   Skin: Negative.   Neurological: Positive for weakness. Negative for dizziness, seizures, facial asymmetry, light-headedness, numbness and headaches.    Objective:  Physical Exam Constitutional:      Appearance: She is well-developed.  HENT:     Head: Normocephalic and atraumatic.  Neck:     Musculoskeletal: Normal range of motion.  Cardiovascular:     Rate and Rhythm: Normal rate and regular rhythm.  Pulmonary:     Effort: Pulmonary effort is normal. No respiratory distress.     Breath sounds: Normal breath sounds. No wheezing or rales.  Abdominal:     General: Bowel sounds are normal. There is no distension.     Palpations:  Abdomen is soft.     Tenderness: There is no abdominal tenderness. There is no rebound.  Skin:    General: Skin is warm and dry.  Neurological:     Mental Status: She is alert.     Coordination: Coordination normal.     Vitals:   04/24/19 1308 04/24/19 1412  BP: (!) 160/110 (!) 160/110  Pulse: 72   Temp: 98 F (36.7 C)   TempSrc: Oral   SpO2: 94%   Weight: 162 lb (73.5 kg)   Height: 5' 6"  (1.676 m)    Flu shot given at visit  Assessment & Plan:  Visit time 40 minutes: greater than 50% of that time was spent in face to face counseling and coordination of care with the patient: counseled about dementia and memory changes, need for life alert or similar for her safety as she has had numerous falls without being able to get up and has waited hours for son to return home as well as blood pressure and other issues

## 2019-04-24 NOTE — Assessment & Plan Note (Signed)
It is unclear if this is partially related to memory but it is not consistent with all of the change on exam. Her sister does not feel she is very depressed currently and still taking prozac and remeron. She is able to laugh about funny matters appropriately during visit.

## 2019-04-24 NOTE — Assessment & Plan Note (Signed)
Given clinical signs as well as CT findings and clinical exam with recent normal labs reasonable to make this diagnosis. Discussed at length with patient about need for safety devices such as life alert and advised that this is required for her with falls and son/DIL gone for a day or two some weekends. She is not to drive and this was discussed with her. I would rate this moderate at least and concern that her lack of interest in activities such as reading is a sign of her memory problems as well.

## 2019-04-24 NOTE — Assessment & Plan Note (Signed)
Due to DEXA next year.

## 2019-04-29 ENCOUNTER — Other Ambulatory Visit: Payer: Self-pay | Admitting: Internal Medicine

## 2019-04-29 MED ORDER — PRAVASTATIN SODIUM 20 MG PO TABS: 20.0000 mg | ORAL_TABLET | Freq: Every day | ORAL | 3 refills | Status: DC

## 2019-05-04 ENCOUNTER — Other Ambulatory Visit: Payer: Self-pay | Admitting: *Deleted

## 2019-05-05 ENCOUNTER — Telehealth: Payer: Self-pay | Admitting: Internal Medicine

## 2019-05-05 NOTE — Telephone Encounter (Signed)
Patient is having abdominal pain and she reports " I feel lousy".  She sounds in slight distress.  She is advised to go to the ED if she cannot tolerate the pain.  She is scheduled for follow up with Nicoletta Ba PA on 06/11/19

## 2019-05-05 NOTE — Telephone Encounter (Signed)
Pt states that she has been having pain on her right side for several days and it is getting worse, she would like to see somebody tomorrow. Pls call her.

## 2019-05-05 NOTE — Patient Outreach (Signed)
[facility]) Care Management  05/05/2019  [place] 03/26/1941 [id number]  CSW made contact with pt who reports she is hurting on her side; she denies any SOB and states; "I think I have a kidney infection".  CSW encouraged pt to call her PCP to which she says she will do so.  Pt denies any concerns or needs from CSW at this time however CSW inquired about her recent PCP visit and the discussion about getting a life alert system. Pt agrees to look into this and CSW will mail pt some info on possible life alert systems to consider. CSW will plan a follow up call to pt later this month.   Eduard Clos, MSW, [place] Worker  [place] [phone]

## 2019-05-11 ENCOUNTER — Ambulatory Visit (INDEPENDENT_AMBULATORY_CARE_PROVIDER_SITE_OTHER): Payer: Medicare Other | Admitting: Physician Assistant

## 2019-05-11 ENCOUNTER — Other Ambulatory Visit (INDEPENDENT_AMBULATORY_CARE_PROVIDER_SITE_OTHER): Payer: Medicare Other

## 2019-05-11 ENCOUNTER — Encounter: Payer: Self-pay | Admitting: Physician Assistant

## 2019-05-11 VITALS — BP 114/74 | HR 84 | Temp 97.6°F | Ht 65.0 in | Wt 162.2 lb

## 2019-05-11 DIAGNOSIS — M549 Dorsalgia, unspecified: Secondary | ICD-10-CM

## 2019-05-11 DIAGNOSIS — K50119 Crohn's disease of large intestine with unspecified complications: Secondary | ICD-10-CM

## 2019-05-11 LAB — CBC WITH DIFFERENTIAL/PLATELET
Basophils Absolute: 0 10*3/uL (ref 0.0–0.1)
Basophils Relative: 0.1 % (ref 0.0–3.0)
Eosinophils Absolute: 0 10*3/uL (ref 0.0–0.7)
Eosinophils Relative: 0 % (ref 0.0–5.0)
HCT: 42.4 % (ref 36.0–46.0)
Hemoglobin: 14.1 g/dL (ref 12.0–15.0)
Lymphocytes Relative: 5.4 % — ABNORMAL LOW (ref 12.0–46.0)
Lymphs Abs: 1 10*3/uL (ref 0.7–4.0)
MCHC: 33.1 g/dL (ref 30.0–36.0)
MCV: 93.1 fl (ref 78.0–100.0)
Monocytes Absolute: 0.3 10*3/uL (ref 0.1–1.0)
Monocytes Relative: 1.7 % — ABNORMAL LOW (ref 3.0–12.0)
Neutro Abs: 17.5 10*3/uL — ABNORMAL HIGH (ref 1.4–7.7)
Neutrophils Relative %: 92.8 % — ABNORMAL HIGH (ref 43.0–77.0)
Platelets: 554 10*3/uL — ABNORMAL HIGH (ref 150.0–400.0)
RBC: 4.56 Mil/uL (ref 3.87–5.11)
RDW: 15.2 % (ref 11.5–15.5)
WBC: 18.8 10*3/uL (ref 4.0–10.5)

## 2019-05-11 LAB — SEDIMENTATION RATE: Sed Rate: 48 mm/hr — ABNORMAL HIGH (ref 0–30)

## 2019-05-11 LAB — BASIC METABOLIC PANEL
BUN: 27 mg/dL — ABNORMAL HIGH (ref 6–23)
CO2: 24 mEq/L (ref 19–32)
Calcium: 9.8 mg/dL (ref 8.4–10.5)
Chloride: 99 mEq/L (ref 96–112)
Creatinine, Ser: 1.69 mg/dL — ABNORMAL HIGH (ref 0.40–1.20)
GFR: 29.3 mL/min — ABNORMAL LOW (ref >60.00)
Glucose, Bld: 180 mg/dL — ABNORMAL HIGH (ref 70–99)
Potassium: 3.7 mEq/L (ref 3.5–5.1)
Sodium: 135 mEq/L (ref 135–145)

## 2019-05-11 NOTE — Patient Instructions (Addendum)
If you are age 77 or older, your body mass index should be between 23-30. Your Body mass index is 26.99 kg/m. If this is out of the aforementioned range listed, please consider follow up with your Primary Care Provider.  If you are age 53 or younger, your body mass index should be between 19-25. Your Body mass index is 26.99 kg/m. If this is out of the aformentioned range listed, please consider follow up with your Primary Care Provider.   Your provider has requested that you go to the basement level for lab work before leaving today. Press "B" on the elevator. The lab is located at the first door on the left as you exit the elevator.  NO medication changes. Continue: Prednisone 20 mg daily Remeron 15 mg at bedtime. Fluoxetine 20 mg daily. Protonix 40 mg daily.  Follow up with Dr Carlean Purl on June 23, 2019 at 1:30 pm.  Thank you for choosing me and [facility].   Amy Esterwood, PA-C

## 2019-05-11 NOTE — Progress Notes (Signed)
Subjective:    Patient ID: [name] [name], female    DOB: 1940-09-10, 78 y.o.   MRN: [id number]  HPI [name] [name] is a pleasant 78 year old white female, known to Dr. Carlean Purl with diagnosis of Crohn's colitis, and PBC.  She has been maintained on chronic prednisone at 20 mg daily.  She is also been seen recently with depression, and has underlying dementia. Last office visit was 04/21/2019.  At that time prednisone was maintained at 20 mg daily, mirtazapine was increased to 15 mg at bedtime and Prozac was increased to 20 mg once daily. She had called last week with abdominal pain, and this visit was scheduled.  Patient tells me today she is not having any abdominal pain says she feels lousy "" in general.  When asked her to be more specific she said she been having some discomfort in her right back and flank over the past week intermittently which has been a little worse over the past few days.  She wonders if she has a kidney infection.  She denies any fever or chills, no nausea and no dysuria. Is not aware of any fall or injury. She says her bowel movements are pretty normal for her which is loose but not diarrhea with usually 2 bowel movements per day, no melena or hematochezia. Her weight is up 2 pounds since last office visit and she says she is doing a bit better from a depression standpoint, but "depends on the day". Last colonoscopy November 2019 with diffuse colitis, pseudopolyps and 3 sessile polyps removed which were tubular adenomas. Most recent labs on 04/24/2019 WBC of 17.8 hemoglobin 13 creatinine 1.4   Review of Systems Pertinent positive and negative review of systems were noted in the above HPI section.  All other review of systems was otherwise negative.  Outpatient Encounter Medications as of 05/11/2019  Medication Sig  . acetaminophen (TYLENOL) 325 MG tablet Take 650 mg by mouth every 6 (six) hours as needed for moderate pain.  Marland Kitchen amLODipine (NORVASC) 10 MG tablet Take 1 tablet (10 mg  total) by mouth daily.  Marland Kitchen donepezil (ARICEPT) 5 MG tablet Take 1 tablet (5 mg total) by mouth at bedtime.  Marland Kitchen FLUoxetine (PROZAC) 20 MG capsule Take 1 capsule (20 mg total) by mouth daily.  . hydrochlorothiazide (HYDRODIURIL) 25 MG tablet Take 1 tablet (25 mg total) by mouth daily.  . mirtazapine (REMERON) 15 MG tablet Take 1 tablet (15 mg total) by mouth at bedtime.  . Multiple Vitamin (MULTIVITAMIN WITH MINERALS) TABS tablet Take 1 tablet by mouth daily.  . pantoprazole (PROTONIX) 40 MG tablet Take 1 tablet (40 mg total) by mouth daily.  . pravastatin (PRAVACHOL) 20 MG tablet Take 1 tablet (20 mg total) by mouth daily.  . predniSONE (DELTASONE) 10 MG tablet Take 2 tablets (20 mg total) by mouth daily with breakfast.  . triamcinolone cream (KENALOG) 0.1 % Apply 1 application topically 2 (two) times daily.  . ursodiol (ACTIGALL) 300 MG capsule Take 1 capsule (300 mg total) by mouth 2 (two) times daily.   No facility-administered encounter medications on file as of 05/11/2019.    Allergies  Allergen Reactions  . Azathioprine Nausea Only    Elevated lipase also - ? Pancreatitis   . Entyvio [Vedolizumab] Shortness Of Breath  . Adhesive [Tape] Rash    Taking skin off  . Sulfa Antibiotics Itching and Rash   Patient Active Problem List   Diagnosis Date Noted  . 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  Protein-calorie malnutrition ([place]) 03/06/2019  . Severe major depression ([place]) 03/03/2019  . Venous insufficiency 01/28/2018  . Routine general medical examination at a health care facility 01/10/2018  . Iron deficiency anemia due to chronic blood loss 12/18/2017  . Elevated platelet count 12/11/2017  . Dementia ([place]) 11/01/2017  . Lumbar radiculopathy 12/31/2016  . Neck pain 03/05/2016  . Cervical paraspinal muscle spasm 03/01/2016  . H/O Bell's palsy 01/09/2016  . 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 Cough variant asthma vs uacs vs bronchiectasis 01/14/2015  .  Hyperglycemia 10/11/2014  . Esophageal reflux 09/22/2014  . 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 Immunosuppression - on chronic prednisone, hx of Remicade and azathioprine 07/09/2011  . Crohn's disease of colon with rectal bleeding ([place])   . Hypertension   . Arthritis    Social History   Socioeconomic History  . Marital status: Widowed    Spouse name: Not on file  . Number of children: 3  . Years of education: Not on file  . Highest education level: Not on file  Occupational History  . Occupation: retired     Fish farm manager: RETIRED  Social Needs  . Financial resource strain: Not on file  . Food insecurity    Worry: Never true    Inability: Never true  . Transportation needs    Medical: No    Non-medical: No  Tobacco Use  . Smoking status: Former Smoker    Packs/day: 0.25    Years: 10.00    Pack years: 2.50    Types: Cigarettes    Quit date: 09/26/1968    Years since quitting: 50.6  . Smokeless tobacco: Never Used  Substance and Sexual Activity  . Alcohol use: Not Currently    Alcohol/week: 0.0 standard drinks  . Drug use: No  . Sexual activity: Not Currently  Lifestyle  . Physical activity    Days per week: Not on file    Minutes per session: Not on file  . Stress: Not on file  Relationships  . Social Herbalist on phone: Not on file    Gets together: Not on file    Attends religious service: Not on file    Active member of club or organization: Not on file    Attends meetings of clubs or organizations: Not on file    Relationship status: Not on file  . Intimate partner violence    Fear of current or ex partner: Not on file    Emotionally abused: Not on file    Physically abused: Not on file    Forced sexual activity: Not on file  Other Topics Concern  . Not on file  Social History Narrative   The patient is retired and widowed.  She has 3 children and grandchildren.   Second home in [place] she does not go there much anymore.   She is a former  smoker but does not use alcohol or tobacco or drugs.   4 dogs    [name] [name]'s family history includes Breast cancer in her paternal aunt; Heart attack in her father; Heart disease in her brother, father, and mother; Leukemia in her maternal grandfather and mother; Ovarian cancer in her paternal aunt; Parkinsonism in her brother; Throat cancer in her paternal aunt.      Objective:    Vitals:   05/11/19 1421  BP: 114/74  Pulse: 84  Temp: 97.6 F (36.4 C)    Physical Exam Well-developed well-nourished elderly white female in no acute  distress.  Height, Weight 162, up 2 pounds, BMI 26.9  HEENT; nontraumatic normocephalic, EOMI, PER R LA, sclera anicteric. Oropharynx; not examined/mask/Covid Neck; supple, no JVD Cardiovascular; regular rate and rhythm with S1-S2, no murmur rub or gallop Pulmonary; Clear bilaterally Abdomen; soft, nontender, nondistended, no palpable mass or hepatosplenomegaly, bowel sounds are active.  She is tender to palpation along the right posterior lower ribs, no rash or lesion evident Rectal; not done Skin; benign exam, no jaundice rash or appreciable lesions Extremities; no clubbing cyanosis or edema skin warm and dry Neuro/Psych; alert and oriented x4, grossly nonfocal mood and affect appropriate       Assessment & Plan:   #40 78 year old white female with Crohn's colitis maintained on chronic prednisone comes in today after called last week with complaints of pain.  She denies any abdominal pain today, and colitis symptoms appear stable. 2.  Right back/lower rib tenderness-I do not think this is of GI etiology and likely musculoskeletal, rule out UTI. #3 dementia 4.  Depression-appears stable to improved   Plan; continue prednisone 20 mg p.o. every morning Continue fluoxetine 20 mg once daily Continue mirtazapine 15 mg p.o. nightly Continue pantoprazole 40 mg p.o. every morning Check CBC with differential, BMET, sed rate and UA with reflex culture  today Have scheduled patient for follow-up appointment with Dr. Carlean Purl in 6 weeks. [name] Gaede S Ane Conerly PA-C 05/11/2019   Cc: Hoyt Koch, *

## 2019-05-18 ENCOUNTER — Other Ambulatory Visit: Payer: Self-pay | Admitting: *Deleted

## 2019-05-18 NOTE — Patient Outreach (Signed)
[facility]) Care Management  05/18/2019  [place] 1941/03/27 [id number]   CSW spoke with pt who continues to deny any depression or concerns.  "I'm just ready for everything to get back to normal".  CSW completed the depression screening with pt (Scored 9) and she continues to deny need for any support/treatments.   CSW offered support to pt and will plan to engage pt again in 1-2 weeks.  Eduard Clos, MSW, [place] Worker  [place] [phone]

## 2019-05-22 NOTE — Progress Notes (Signed)
Subjective:   [name] [name] is a 78 y.o. female who presents for Medicare Annual (Subsequent) preventive examination.  Review of Systems:   Cardiac Risk Factors include: advanced age (>38mn, >>42women);dyslipidemia;hypertension Sleep patterns: feels rested on waking, gets up 1-2 times nightly to void and sleeps 7-8 hours nightly.    Home Safety/Smoke Alarms: Feels safe in home. Smoke alarms in place.  Living environment; residence and Firearm Safety: 1-story house/ trailer Lives with son and daughter-in-law, no needs for DME, good support system. Seat Belt Safety/Bike Helmet: Wears seat belt.      Objective:     Vitals: BP 126/85   Pulse 81   Resp 17   Ht 5' 5"  (1.651 m)   Wt 165 lb (74.8 kg)   SpO2 99%   BMI 27.46 kg/m   Body mass index is 27.46 kg/m.  Advanced Directives 05/25/2019 03/13/2019 03/10/2019 01/27/2019 11/21/2017 05/19/2015 12/13/2014  Does Patient Have a Medical Advance Directive? Yes - Yes No Yes Yes No  Type of AParamedicof AGanttLiving will Healthcare Power of [place]-  Does patient want to make changes to medical advance directive? - - No - Patient declined - - - -  Copy of [place] Chart? No - copy requested - - - No - copy requested - -  Would patient like information on creating a medical advance directive? - - - No - Patient declined - - -  Pre-existing out of facility DNR order (yellow form or pink MOST form) - - - - - - -    Tobacco Social History   Tobacco Use  Smoking Status Former Smoker  . Packs/day: 0.25  . Years: 10.00  . Pack years: 2.50  . Types: Cigarettes  . Quit date: 09/26/1968  . Years since quitting: 50.6  Smokeless Tobacco Never Used     Counseling given: Not Answered  Past Medical History:  Diagnosis Date  . Allergy    year around allergies   . Arthritis    shoulder  . Bell's palsy    . Blood transfusion    1966  . Blood transfusion without reported diagnosis   . Cataract    bilaterally removed  . Colon polyps    adenomatous polyp June 2012  . Complication of anesthesia    slow to wake  . Crohn's colitis ([place]   . Dry eyes    left eye worst one  . GERD (gastroesophageal reflux disease)   . Headache(784.0)    hx migraines  . Hypertension   . Protein-calorie malnutrition ([place] 03/06/2019  . Severe major depression ([place] 03/03/2019  . Shortness of breath    Past Surgical History:  Procedure Laterality Date  . ABDOMINAL HYSTERECTOMY    . APPENDECTOMY    . CATARACT EXTRACTION Bilateral    Separate dates  . CHOLECYSTECTOMY    . COLONOSCOPY  multiple  . flex sigmoidoscopy with biospy  01/17/12  . FLEXIBLE SIGMOIDOSCOPY  06/08/2011   severe Crohn's colitis to descending colon at least  . FOOT GANGLION EXCISION     left  . SHOULDER SURGERY     right  . TONSILLECTOMY     Family History  Problem Relation Age of Onset  . Leukemia Mother   . Heart disease Mother   . Heart disease Father   . Heart attack Father   . Ovarian cancer Paternal Aunt  1/2 aunt  . Breast cancer Paternal Aunt        1/2 aunt  . Throat cancer Paternal Aunt        1/2 aunt  . Parkinsonism Brother   . Heart disease Brother   . Leukemia Maternal Grandfather   . Anesthesia problems Neg Hx   . Hypotension Neg Hx   . Malignant hyperthermia Neg Hx   . Pseudochol deficiency Neg Hx   . Colon cancer Neg Hx   . Diabetes Neg Hx   . Esophageal cancer Neg Hx   . Rectal cancer Neg Hx   . Stomach cancer Neg Hx    Social History   Socioeconomic History  . Marital status: Widowed    Spouse name: Not on file  . Number of children: 3  . Years of education: Not on file  . Highest education level: Not on file  Occupational History  . Occupation: retired     Fish farm manager: RETIRED  Social Needs  . Financial resource strain: Not hard at all  . Food insecurity    Worry: Never true     Inability: Never true  . Transportation needs    Medical: No    Non-medical: No  Tobacco Use  . Smoking status: Former Smoker    Packs/day: 0.25    Years: 10.00    Pack years: 2.50    Types: Cigarettes    Quit date: 09/26/1968    Years since quitting: 50.6  . Smokeless tobacco: Never Used  Substance and Sexual Activity  . Alcohol use: Not Currently    Alcohol/week: 0.0 standard drinks  . Drug use: No  . Sexual activity: Not Currently  Lifestyle  . Physical activity    Days per week: 0 days    Minutes per session: 0 min  . Stress: Not at all  Relationships  . Social connections    Talks on phone: More than three times a week    Gets together: More than three times a week    Attends religious service: 1 to 4 times per year    Active member of club or organization: Not on file    Attends meetings of clubs or organizations: Not on file    Relationship status: Widowed  Other Topics Concern  . Not on file  Social History Narrative   The patient is retired and widowed.  She has 3 children and grandchildren.   Second home in [place] she does not go there much anymore.   She is a former smoker but does not use alcohol or tobacco or drugs.   4 dogs    Outpatient Encounter Medications as of 05/25/2019  Medication Sig  . acetaminophen (TYLENOL) 325 MG tablet Take 650 mg by mouth every 6 (six) hours as needed for moderate pain.  Marland Kitchen amLODipine (NORVASC) 10 MG tablet Take 1 tablet (10 mg total) by mouth daily.  Marland Kitchen donepezil (ARICEPT) 5 MG tablet Take 1 tablet (5 mg total) by mouth at bedtime.  Marland Kitchen FLUoxetine (PROZAC) 20 MG capsule Take 1 capsule (20 mg total) by mouth daily.  . hydrochlorothiazide (HYDRODIURIL) 25 MG tablet Take 1 tablet (25 mg total) by mouth daily.  . mirtazapine (REMERON) 15 MG tablet Take 1 tablet (15 mg total) by mouth at bedtime.  . Multiple Vitamin (MULTIVITAMIN WITH MINERALS) TABS tablet Take 1 tablet by mouth daily.  . pantoprazole (PROTONIX) 40 MG tablet Take  1 tablet (40 mg total) by mouth daily.  . pravastatin (PRAVACHOL) 20  MG tablet Take 1 tablet (20 mg total) by mouth daily.  . predniSONE (DELTASONE) 10 MG tablet Take 2 tablets (20 mg total) by mouth daily with breakfast.  . triamcinolone cream (KENALOG) 0.1 % Apply 1 application topically 2 (two) times daily.  . ursodiol (ACTIGALL) 300 MG capsule Take 1 capsule (300 mg total) by mouth 2 (two) times daily.   No facility-administered encounter medications on file as of 05/25/2019.     Activities of Daily Living In your present state of health, do you have any difficulty performing the following activities: 05/25/2019 03/13/2019  Hearing? N N  Vision? N Y  Difficulty concentrating or making decisions? Y -  Walking or climbing stairs? N -  Dressing or bathing? N -  Doing errands, shopping? Y -  Conservation officer, nature and eating ? Y -  Using the Toilet? N -  In the past six months, have you accidently leaked urine? N -  Do you have problems with loss of bowel control? N -  Managing your Medications? Y -  Managing your Finances? Y -  Housekeeping or managing your Housekeeping? Y -  Some recent data might be hidden    Patient Care Team: Hoyt Koch, MD as PCP - General (Internal Medicine) [name], MD as Consulting Physician (Gastroenterology) Dannielle Karvonen, RN as [facility] Marcelline Deist, Ranae Pila, LCSW as [place] Management    Assessment:   This is a routine wellness examination for [facility]. Physical assessment deferred to PCP.   Exercise Activities and Dietary recommendations Current Exercise Habits: The patient does not participate in regular exercise at present, Exercise limited by: None identified  Diet (meal preparation, eat out, water intake, caffeinated beverages, dairy products, fruits and vegetables): in general, a "healthy" diet  , well balanced Reports having a good appetite Encouraged patient to increase daily water  and healthy fluid intake.  Goals    . Patient Stated     Continue to live life to the fullest and enjoy a variety of things. Stay socially involved with all of my loving family.        Fall Risk Fall Risk  05/25/2019 11/18/2018 01/10/2018 02/08/2017 06/20/2015  Falls in the past year? 1 0 No No No  Comment - - - [place] Telephone Survey: data to providers prior to load -  Number falls in past yr: 1 0 - - -  Injury with Fall? 0 0 - - -  Risk for fall due to : Impaired mobility;Impaired balance/gait - - - -   Is the patient's home free of loose throw rugs in walkways, pet beds, electrical cords, etc?   yes      Grab bars in the bathroom? yes      Handrails on the stairs?   yes      Adequate lighting?   yes  Depression Screen PHQ 2/9 Scores 05/25/2019 05/18/2019 04/22/2019 04/21/2019  PHQ - 2 Score 1 2 2 2   PHQ- 9 Score 5 9 12 13      Cognitive Function     6CIT Screen 05/25/2019 04/21/2019  What Year? 4 points 4 points  What month? 3 points 0 points  What time? 3 points 3 points  Count back from 20 2 points 0 points  Months in reverse 4 points 0 points  Repeat phrase 4 points 8 points  Total Score 20 15    Immunization History  Administered Date(s) Administered  . Fluad Quad(high Dose 65+) 04/24/2019  .  Influenza Split 04/22/2012  . Influenza,inj,Quad PF,6+ Mos 03/25/2015  . Influenza-Unspecified 04/22/2013  . PPD Test 06/07/2011, 10/11/2014  . Pneumococcal Conjugate-13 04/22/2014  . Pneumococcal-Unspecified 06/16/2013  . Tdap 06/19/2013   Screening Tests Health Maintenance  Topic Date Due  . TETANUS/TDAP  06/20/2023  . INFLUENZA VACCINE  Completed  . DEXA SCAN  Completed  . PNA vac Low Risk Adult  Completed      Plan:    Reviewed health maintenance screenings with patient today and relevant education, vaccines, and/or referrals were provided.   I have personally reviewed and noted the following in the patient's chart:   . Medical and social history . Use of  alcohol, tobacco or illicit drugs  . Current medications and supplements . Functional ability and status . Nutritional status . Physical activity . Advanced directives . List of other physicians . Screenings to include cognitive, depression, and falls . Referrals and appointments  In addition, I have reviewed and discussed with patient certain preventive protocols, quality metrics, and best practice recommendations. A written personalized care plan for preventive services as well as general preventive health recommendations were provided to patient.     Michiel Cowboy, RN  05/25/2019

## 2019-05-25 ENCOUNTER — Other Ambulatory Visit: Payer: Self-pay

## 2019-05-25 ENCOUNTER — Ambulatory Visit (INDEPENDENT_AMBULATORY_CARE_PROVIDER_SITE_OTHER): Payer: Medicare Other | Admitting: *Deleted

## 2019-05-25 VITALS — BP 126/85 | HR 81 | Resp 17 | Ht 65.0 in | Wt 165.0 lb

## 2019-05-25 DIAGNOSIS — Z0000 Encounter for general adult medical examination without abnormal findings: Secondary | ICD-10-CM

## 2019-05-25 NOTE — Progress Notes (Signed)
Medical screening examination/treatment/procedure(s) were performed by non-physician practitioner and as supervising physician I was immediately available for consultation/collaboration. I agree with above. Diahn Waidelich A Antionetta Ator, MD 

## 2019-05-25 NOTE — Patient Instructions (Signed)
Continue doing brain stimulating activities (puzzles, reading, adult coloring books, staying active) to keep memory sharp.   Continue to eat heart healthy diet (full of fruits, vegetables, whole grains, lean protein, water--limit salt, fat, and sugar intake) and increase physical activity as tolerated.   [name] [name] , Thank you for taking time to come for your Medicare Wellness Visit. I appreciate your ongoing commitment to your health goals. Please review the following plan we discussed and let me know if I can assist you in the future.   These are the goals we discussed: Goals    . Patient Stated     Continue to live life to the fullest and enjoy a variety of things. Stay socially involved with all of my loving family.        This is a list of the screening recommended for you and due dates:  Health Maintenance  Topic Date Due  . Tetanus Vaccine  06/20/2023  . Flu Shot  Completed  . DEXA scan (bone density measurement)  Completed  . Pneumonia vaccines  Completed    Preventive Care 47 Years and Older, Female Preventive care refers to lifestyle choices and visits with your health care provider that can promote health and wellness. This includes:  A yearly physical exam. This is also called an annual well check.  Regular dental and eye exams.  Immunizations.  Screening for certain conditions.  Healthy lifestyle choices, such as diet and exercise. What can I expect for my preventive care visit? Physical exam Your health care provider will check:  Height and weight. These may be used to calculate body mass index (BMI), which is a measurement that tells if you are at a healthy weight.  Heart rate and blood pressure.  Your skin for abnormal spots. Counseling Your health care provider may ask you questions about:  Alcohol, tobacco, and drug use.  Emotional well-being.  Home and relationship well-being.  Sexual activity.  Eating habits.  History of falls.  Memory  and ability to understand (cognition).  Work and work Statistician.  Pregnancy and menstrual history. What immunizations do I need?  Influenza (flu) vaccine  This is recommended every year. Tetanus, diphtheria, and pertussis (Tdap) vaccine  You may need a Td booster every 10 years. Varicella (chickenpox) vaccine  You may need this vaccine if you have not already been vaccinated. Zoster (shingles) vaccine  You may need this after age 19. Pneumococcal conjugate (PCV13) vaccine  One dose is recommended after age 19. Pneumococcal polysaccharide (PPSV23) vaccine  One dose is recommended after age 61. Measles, mumps, and rubella (MMR) vaccine  You may need at least one dose of MMR if you were born in 1957 or later. You may also need a second dose. Meningococcal conjugate (MenACWY) vaccine  You may need this if you have certain conditions. Hepatitis A vaccine  You may need this if you have certain conditions or if you travel or work in places where you may be exposed to hepatitis A. Hepatitis B vaccine  You may need this if you have certain conditions or if you travel or work in places where you may be exposed to hepatitis B. Haemophilus influenzae type b (Hib) vaccine  You may need this if you have certain conditions. You may receive vaccines as individual doses or as more than one vaccine together in one shot (combination vaccines). Talk with your health care provider about the risks and benefits of combination vaccines. What tests do I need? Blood tests  Lipid and cholesterol levels. These may be checked every 5 years, or more frequently depending on your overall health.  Hepatitis C test.  Hepatitis B test. Screening  Lung cancer screening. You may have this screening every year starting at age 71 if you have a 30-pack-year history of smoking and currently smoke or have quit within the past 15 years.  Colorectal cancer screening. All adults should have this screening  starting at age 76 and continuing until age 11. Your health care provider may recommend screening at age 56 if you are at increased risk. You will have tests every 1-10 years, depending on your results and the type of screening test.  Diabetes screening. This is done by checking your blood sugar (glucose) after you have not eaten for a while (fasting). You may have this done every 1-3 years.  Mammogram. This may be done every 1-2 years. Talk with your health care provider about how often you should have regular mammograms.  BRCA-related cancer screening. This may be done if you have a family history of breast, ovarian, tubal, or peritoneal cancers. Other tests  Sexually transmitted disease (STD) testing.  Bone density scan. This is done to screen for osteoporosis. You may have this done starting at age 60. Follow these instructions at home: Eating and drinking  Eat a diet that includes fresh fruits and vegetables, whole grains, lean protein, and low-fat dairy products. Limit your intake of foods with high amounts of sugar, saturated fats, and salt.  Take vitamin and mineral supplements as recommended by your health care provider.  Do not drink alcohol if your health care provider tells you not to drink.  If you drink alcohol: ? Limit how much you have to 0-1 drink a day. ? Be aware of how much alcohol is in your drink. In the [place] one drink equals one 12 oz bottle of beer (355 mL), one 5 oz glass of Jesscia Imm (148 mL), or one 1 oz glass of hard liquor (44 mL). Lifestyle  Take daily care of your teeth and gums.  Stay active. Exercise for at least 30 minutes on 5 or more days each week.  Do not use any products that contain nicotine or tobacco, such as cigarettes, e-cigarettes, and chewing tobacco. If you need help quitting, ask your health care provider.  If you are sexually active, practice safe sex. Use a condom or other form of protection in order to prevent STIs (sexually transmitted  infections).  Talk with your health care provider about taking a low-dose aspirin or statin. What's next?  Go to your health care provider once a year for a well check visit.  Ask your health care provider how often you should have your eyes and teeth checked.  Stay up to date on all vaccines. This information is not intended to replace advice given to you by your health care provider. Make sure you discuss any questions you have with your health care provider. Document Released: 08/05/2015 Document Revised: 07/03/2018 Document Reviewed: 07/03/2018 Elsevier Patient Education  2020 [employer].

## 2019-06-01 ENCOUNTER — Telehealth: Payer: Self-pay | Admitting: Internal Medicine

## 2019-06-01 DIAGNOSIS — R413 Other amnesia: Secondary | ICD-10-CM

## 2019-06-01 NOTE — Telephone Encounter (Signed)
F/U call Things not going well Jeanett Schlein is w/ drawn and since starting Aricept having fecal incontinencse  No in-home help available - on a waiting list   Plans:  Hold aricept and see if diarrhea is better  Refer to neurology re: memory disturbance -   Need to speak to [name] or Mardene Celeste when scheduling appointment NOT the patient

## 2019-06-02 ENCOUNTER — Emergency Department (HOSPITAL_COMMUNITY): Payer: Medicare Other

## 2019-06-02 ENCOUNTER — Telehealth: Payer: Self-pay | Admitting: Internal Medicine

## 2019-06-02 ENCOUNTER — Other Ambulatory Visit: Payer: Self-pay

## 2019-06-02 ENCOUNTER — Encounter: Payer: Self-pay | Admitting: Neurology

## 2019-06-02 ENCOUNTER — Observation Stay (HOSPITAL_COMMUNITY)
Admission: EM | Admit: 2019-06-02 | Discharge: 2019-06-03 | Disposition: A | Discharge status: Home Health | Payer: Medicare Other | Attending: Internal Medicine | Admitting: Internal Medicine

## 2019-06-02 ENCOUNTER — Encounter (HOSPITAL_COMMUNITY): Payer: Self-pay | Admitting: Emergency Medicine

## 2019-06-02 DIAGNOSIS — R609 Edema, unspecified: Secondary | ICD-10-CM | POA: Diagnosis not present

## 2019-06-02 DIAGNOSIS — S32010A Wedge compression fracture of first lumbar vertebra, initial encounter for closed fracture: Secondary | ICD-10-CM

## 2019-06-02 DIAGNOSIS — F329 Major depressive disorder, single episode, unspecified: Secondary | ICD-10-CM | POA: Insufficient documentation

## 2019-06-02 DIAGNOSIS — M549 Dorsalgia, unspecified: Secondary | ICD-10-CM | POA: Diagnosis present

## 2019-06-02 DIAGNOSIS — K219 Gastro-esophageal reflux disease without esophagitis: Secondary | ICD-10-CM | POA: Diagnosis present

## 2019-06-02 DIAGNOSIS — D849 Immunodeficiency, unspecified: Secondary | ICD-10-CM | POA: Insufficient documentation

## 2019-06-02 DIAGNOSIS — S32000A Wedge compression fracture of unspecified lumbar vertebra, initial encounter for closed fracture: Secondary | ICD-10-CM | POA: Diagnosis present

## 2019-06-02 DIAGNOSIS — D72829 Elevated white blood cell count, unspecified: Secondary | ICD-10-CM | POA: Diagnosis present

## 2019-06-02 DIAGNOSIS — I10 Essential (primary) hypertension: Secondary | ICD-10-CM | POA: Diagnosis not present

## 2019-06-02 DIAGNOSIS — M5489 Other dorsalgia: Secondary | ICD-10-CM | POA: Diagnosis not present

## 2019-06-02 DIAGNOSIS — I129 Hypertensive chronic kidney disease with stage 1 through stage 4 chronic kidney disease, or unspecified chronic kidney disease: Secondary | ICD-10-CM | POA: Insufficient documentation

## 2019-06-02 DIAGNOSIS — D473 Essential (hemorrhagic) thrombocythemia: Secondary | ICD-10-CM | POA: Diagnosis present

## 2019-06-02 DIAGNOSIS — Z03818 Encounter for observation for suspected exposure to other biological agents ruled out: Secondary | ICD-10-CM | POA: Diagnosis not present

## 2019-06-02 DIAGNOSIS — N189 Chronic kidney disease, unspecified: Secondary | ICD-10-CM | POA: Diagnosis not present

## 2019-06-02 DIAGNOSIS — F0390 Unspecified dementia without behavioral disturbance: Secondary | ICD-10-CM | POA: Diagnosis not present

## 2019-06-02 DIAGNOSIS — M545 Low back pain, unspecified: Secondary | ICD-10-CM

## 2019-06-02 DIAGNOSIS — S22080A Wedge compression fracture of T11-T12 vertebra, initial encounter for closed fracture: Secondary | ICD-10-CM

## 2019-06-02 DIAGNOSIS — Z7952 Long term (current) use of systemic steroids: Secondary | ICD-10-CM | POA: Insufficient documentation

## 2019-06-02 DIAGNOSIS — M4855XA Collapsed vertebra, not elsewhere classified, thoracolumbar region, initial encounter for fracture: Principal | ICD-10-CM | POA: Insufficient documentation

## 2019-06-02 DIAGNOSIS — Z20828 Contact with and (suspected) exposure to other viral communicable diseases: Secondary | ICD-10-CM | POA: Insufficient documentation

## 2019-06-02 DIAGNOSIS — Z87891 Personal history of nicotine dependence: Secondary | ICD-10-CM | POA: Insufficient documentation

## 2019-06-02 DIAGNOSIS — Z79899 Other long term (current) drug therapy: Secondary | ICD-10-CM | POA: Insufficient documentation

## 2019-06-02 DIAGNOSIS — S22000A Wedge compression fracture of unspecified thoracic vertebra, initial encounter for closed fracture: Secondary | ICD-10-CM | POA: Diagnosis present

## 2019-06-02 DIAGNOSIS — R52 Pain, unspecified: Secondary | ICD-10-CM | POA: Diagnosis not present

## 2019-06-02 DIAGNOSIS — D75839 Thrombocytosis, unspecified: Secondary | ICD-10-CM | POA: Diagnosis present

## 2019-06-02 DIAGNOSIS — M48061 Spinal stenosis, lumbar region without neurogenic claudication: Secondary | ICD-10-CM | POA: Diagnosis not present

## 2019-06-02 HISTORY — DX: Wedge compression fracture of unspecified lumbar vertebra, initial encounter for closed fracture: S32.000A

## 2019-06-02 LAB — CBC WITH DIFFERENTIAL/PLATELET
Abs Immature Granulocytes: 0.18 10*3/uL — ABNORMAL HIGH (ref 0.00–0.07)
Basophils Absolute: 0.1 10*3/uL (ref 0.0–0.1)
Basophils Relative: 0 %
Eosinophils Absolute: 0 10*3/uL (ref 0.0–0.5)
Eosinophils Relative: 0 %
HCT: 39.7 % (ref 36.0–46.0)
Hemoglobin: 12.6 g/dL (ref 12.0–15.0)
Immature Granulocytes: 1 %
Lymphocytes Relative: 8 %
Lymphs Abs: 1.5 10*3/uL (ref 0.7–4.0)
MCH: 31 pg (ref 26.0–34.0)
MCHC: 31.7 g/dL (ref 30.0–36.0)
MCV: 97.8 fL (ref 80.0–100.0)
Monocytes Absolute: 1.5 10*3/uL — ABNORMAL HIGH (ref 0.1–1.0)
Monocytes Relative: 8 %
Neutro Abs: 15.3 10*3/uL — ABNORMAL HIGH (ref 1.7–7.7)
Neutrophils Relative %: 83 %
Platelets: 417 10*3/uL — ABNORMAL HIGH (ref 150–400)
RBC: 4.06 MIL/uL (ref 3.87–5.11)
RDW: 14.4 % (ref 11.5–15.5)
WBC: 18.5 10*3/uL — ABNORMAL HIGH (ref 4.0–10.5)
nRBC: 0 % (ref 0.0–0.2)

## 2019-06-02 LAB — BASIC METABOLIC PANEL
Anion gap: 10 (ref 5–15)
BUN: 25 mg/dL — ABNORMAL HIGH (ref 8–23)
CO2: 23 mmol/L (ref 22–32)
Calcium: 8.6 mg/dL — ABNORMAL LOW (ref 8.9–10.3)
Chloride: 105 mmol/L (ref 98–111)
Creatinine, Ser: 1.46 mg/dL — ABNORMAL HIGH (ref 0.44–1.00)
GFR calc Af Amer: 40 mL/min — ABNORMAL LOW (ref >60)
GFR calc non Af Amer: 34 mL/min — ABNORMAL LOW (ref >60)
Glucose, Bld: 124 mg/dL — ABNORMAL HIGH (ref 70–99)
Potassium: 3.6 mmol/L (ref 3.5–5.1)
Sodium: 138 mmol/L (ref 135–145)

## 2019-06-02 MED ORDER — METHOCARBAMOL 1000 MG/10ML IJ SOLN: 500.0000 mg | Freq: Once | INTRAMUSCULAR | Status: DC

## 2019-06-02 MED ORDER — HYDROMORPHONE HCL 1 MG/ML IJ SOLN
0.5000 mg | Freq: Once | INTRAMUSCULAR | Status: AC
  Administered 2019-06-02: 0.5 mg via INTRAVENOUS
  Filled 2019-06-02: qty 1

## 2019-06-02 MED ORDER — LORAZEPAM 2 MG/ML IJ SOLN
0.5000 mg | Freq: Once | INTRAMUSCULAR | Status: AC
  Administered 2019-06-02: 0.5 mg via INTRAVENOUS
  Filled 2019-06-02: qty 1

## 2019-06-02 MED ORDER — MORPHINE SULFATE (PF) 4 MG/ML IV SOLN
4.0000 mg | Freq: Once | INTRAVENOUS | Status: AC
  Administered 2019-06-02: 4 mg via INTRAVENOUS
  Filled 2019-06-02: qty 1

## 2019-06-02 MED ORDER — ONDANSETRON HCL 4 MG/2ML IJ SOLN
4.0000 mg | Freq: Once | INTRAMUSCULAR | Status: AC
  Administered 2019-06-02: 4 mg via INTRAVENOUS
  Filled 2019-06-02: qty 2

## 2019-06-02 MED ORDER — HYDROMORPHONE HCL 1 MG/ML IJ SOLN: 1.0000 mg | Freq: Once | INTRAMUSCULAR | Status: DC

## 2019-06-02 MED ORDER — METHOCARBAMOL 1000 MG/10ML IJ SOLN
500.0000 mg | Freq: Once | INTRAVENOUS | Status: AC
  Administered 2019-06-02: 500 mg via INTRAVENOUS
  Filled 2019-06-02: qty 500

## 2019-06-02 NOTE — ED Notes (Signed)
Shawn PA at bedside

## 2019-06-02 NOTE — ED Notes (Signed)
Per MRI-they wont be able to get her for MRI until around 8pm

## 2019-06-02 NOTE — ED Notes (Signed)
Patient transported to X-ray 

## 2019-06-02 NOTE — ED Notes (Signed)
Called lab d/t lab work not resulted. Lab states they never received the blood. Will redraw when pt returns from MRI.

## 2019-06-02 NOTE — ED Provider Notes (Signed)
Medical screening examination/treatment/procedure(s) were conducted as a shared visit with non-physician practitioner(s) and myself.  I personally evaluated the patient during the encounter.    78 year old female here with worsening low back pain.  On exam here she is neurologically intact.  Will attempt to control patient's pain and likely discharge home   Lacretia Leigh, MD 06/02/19 1413

## 2019-06-02 NOTE — ED Triage Notes (Signed)
Per [facility], states back pain for 2 weeks-last 3 days it has gotten worse-was suppose to have an appointment with ortho this am but she couldn't get out of bed-history of chronic back pain

## 2019-06-02 NOTE — ED Notes (Signed)
Pt transported to MRI 

## 2019-06-02 NOTE — ED Provider Notes (Signed)
[facility] DEPT Provider Note   CSN: [id number] Arrival date & time: 06/02/19  1031     History   Chief Complaint Chief Complaint  Patient presents with   Back Pain    HPI [name] [name] is a 78 y.o. female.     HPI   [name] [name] is a 78 y.o. female, with a history of GERD, HTN, dementia, lumbar radiculopathy, Crohn's, presenting to the ED with back pain beginning last night.  She states she has had chronic lower back pain for several years with intermittent flares.  Her pain last night started while she was trying to change positions in an attempt to get comfortable.  She could feel her back beginning to tighten and pain began to increase throughout the day yesterday.  Pain is central, lower back, sharp with intermittent spasms, nonradiating. She had an appointment with an orthopedist this morning, the name of whom she cannot remember, but missed this appointment due to her pain. Denies any recent changes in health, procedures, or injections. Denies fever/chills, urinary symptoms, falls/trauma, weakness, numbness, abdominal pain, changes in bowel/bladder function, saddle anesthesias, or any other complaints.      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   Shortness of breath     Patient Active Problem List   Diagnosis Date Noted   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   Protein-calorie  malnutrition ([place]) 03/06/2019   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   Venous insufficiency 01/28/2018   Routine general medical examination at a health care facility 01/10/2018   Iron deficiency anemia due to chronic blood loss 12/18/2017   Elevated platelet count 12/11/2017   Dementia ([place]) 11/01/2017   Lumbar radiculopathy 12/31/2016   Neck pain 03/05/2016   Cervical paraspinal muscle spasm 03/01/2016   H/O Bell's palsy 01/09/2016   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   Cough variant asthma vs uacs vs bronchiectasis 01/14/2015   Hyperglycemia 10/11/2014   Esophageal reflux 09/22/2014   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   Immunosuppression - on chronic prednisone, hx of Remicade and azathioprine 07/09/2011   Crohn's disease of colon with rectal bleeding ([place])    Hypertension    Arthritis     Past Surgical History:  Procedure Laterality Date   ABDOMINAL HYSTERECTOMY     APPENDECTOMY     CATARACT EXTRACTION Bilateral    Separate dates   CHOLECYSTECTOMY     COLONOSCOPY  multiple   flex sigmoidoscopy with biospy  01/17/12   FLEXIBLE SIGMOIDOSCOPY  06/08/2011   severe Crohn's colitis to descending colon at least   FOOT GANGLION EXCISION     left   SHOULDER SURGERY     right   TONSILLECTOMY       OB History   No obstetric history on file.      Home  Medications    Prior to Admission medications   Medication Sig Start Date End Date Taking? Authorizing Provider  acetaminophen (TYLENOL) 325 MG tablet Take 650 mg by mouth every 6 (six) hours as needed for moderate pain.   Yes [provider]  amLODipine (NORVASC) 10 MG tablet Take 1 tablet (10 mg total) by mouth daily. 04/24/19  Yes Hoyt Koch, MD  B Complex-Folic Acid (B COMPLEX FORMULA 1) TABS Take 1 tablet by mouth daily.   Yes [provider]  donepezil (ARICEPT) 5 MG tablet Take 1 tablet (5 mg total) by mouth at bedtime. 04/24/19  Yes Hoyt Koch,  MD  FLUoxetine (PROZAC) 20 MG capsule Take 1 capsule (20 mg total) by mouth daily. 04/21/19  Yes [name], MD  hydrochlorothiazide (HYDRODIURIL) 25 MG tablet Take 1 tablet (25 mg total) by mouth daily. 04/21/19  Yes [name], MD  Multiple Vitamin (MULTIVITAMIN WITH MINERALS) TABS tablet Take 1 tablet by mouth daily.   Yes [provider]  predniSONE (DELTASONE) 10 MG tablet Take 2 tablets (20 mg total) by mouth daily with breakfast. 04/03/19  Yes [name], MD  triamcinolone cream (KENALOG) 0.1 % Apply 1 application topically 2 (two) times daily. 04/24/19  Yes Hoyt Koch, MD  ursodiol (ACTIGALL) 300 MG capsule Take 1 capsule (300 mg total) by mouth 2 (two) times daily. 04/03/19  Yes [name], MD  mirtazapine (REMERON) 15 MG tablet Take 1 tablet (15 mg total) by mouth at bedtime. Patient not taking: Reported on 06/02/2019 04/03/19   [name], MD  pantoprazole (PROTONIX) 40 MG tablet Take 1 tablet (40 mg total) by mouth daily. Patient not taking: Reported on 06/02/2019 02/03/19 05/10/21  Barb Merino, MD  pravastatin (PRAVACHOL) 20 MG tablet Take 1 tablet (20 mg total) by mouth daily. Patient not taking: Reported on 06/02/2019 04/29/19   Hoyt Koch, MD    Family History Family History  Problem Relation Age of Onset   Leukemia Mother    Heart disease Mother    Heart disease Father    Heart attack Father    Ovarian cancer Paternal 32        1/2 aunt   Breast cancer Paternal 48        1/2 aunt   Throat cancer Paternal Aunt        1/2 aunt   Parkinsonism Brother    Heart disease Brother    Leukemia Maternal Grandfather    Anesthesia problems Neg Hx    Hypotension Neg Hx    Malignant hyperthermia Neg Hx    Pseudochol deficiency Neg Hx    Colon cancer Neg Hx    Diabetes Neg Hx    Esophageal cancer Neg Hx    Rectal cancer Neg Hx    Stomach cancer Neg Hx     Social History Social History   Tobacco Use    Smoking status: Former Smoker    Packs/day: 0.25    Years: 10.00    Pack years: 2.50    Types: Cigarettes    Quit date: 09/26/1968    Years since quitting: 50.7   Smokeless tobacco: Never Used  Substance Use Topics   Alcohol use: Not Currently    Alcohol/week: 0.0 standard drinks   Drug use: No     Allergies   Azathioprine, Entyvio [vedolizumab], Adhesive [tape], and Sulfa antibiotics   Review of Systems Review of Systems  Constitutional: Negative for chills, diaphoresis and fever.  Respiratory: Negative for shortness of breath.   Cardiovascular: Negative for chest pain.  Gastrointestinal: Negative for abdominal pain, diarrhea, nausea and vomiting.  Genitourinary: Negative for difficulty urinating, dysuria, flank pain, frequency and hematuria.  Musculoskeletal: Positive for back pain.  Neurological: Negative for weakness and numbness.  All other systems reviewed and are negative.    Physical Exam Updated Vital Signs BP (!) 147/87 (BP Location: Right Arm)    Pulse 74    Temp 98.2 F (36.8 C) (Oral)    Resp 18    SpO2 98%   Physical Exam Vitals signs and nursing note reviewed.  Constitutional:      General: She is not in acute distress.    Appearance: She is well-developed. She is not diaphoretic.  HENT:     Head: Normocephalic and atraumatic.     Mouth/Throat:     Mouth: Mucous membranes are moist.     Pharynx: Oropharynx is clear.  Eyes:     Conjunctiva/sclera: Conjunctivae normal.  Neck:     Musculoskeletal: Neck supple.  Cardiovascular:     Rate and Rhythm: Normal rate and regular rhythm.     Pulses: Normal pulses.          Radial pulses are 2+ on the right side and 2+ on the left side.       Dorsalis pedis pulses are 2+ on the right side and 2+ on the left side.       Posterior tibial pulses are 2+ on the right side and 2+ on the left side.     Heart sounds: Normal heart sounds.     Comments: Tactile temperature in the extremities appropriate and  equal bilaterally. Pulmonary:     Effort: Pulmonary effort is normal. No respiratory distress.     Breath sounds: Normal breath sounds.     Comments: Patient lying supine in the bed with no increased work of breathing.  Speaks in full sentences without difficulty. Abdominal:     Palpations: Abdomen is soft.     Tenderness: There is no abdominal tenderness. There is no guarding.  Musculoskeletal:     Lumbar back: She exhibits tenderness and bony tenderness. She exhibits no swelling and no deformity.       Back:     Right lower leg: Edema present.     Left lower leg: Edema present.  Lymphadenopathy:     Cervical: No cervical adenopathy.  Skin:    General: Skin is warm and dry.  Neurological:     Mental Status: She is alert.     Comments: Sensation grossly intact to light touch in the extremities.  Grip strengths equal bilaterally.  Strength 5/5 in all extremities. Coordination intact. Cranial nerves III-XII grossly intact. No facial droop.   Psychiatric:        Mood and Affect: Mood and affect normal.        Speech: Speech normal.        Behavior: Behavior normal.      ED Treatments / Results  Labs (all labs ordered are listed, but only abnormal results are displayed) Labs Reviewed  BASIC METABOLIC PANEL  CBC WITH DIFFERENTIAL/PLATELET    EKG None  Radiology Dg Lumbar Spine Complete  Result Date: 06/02/2019 CLINICAL DATA:  Increased midline back pain EXAM: LUMBAR SPINE - COMPLETE 4+ VIEW COMPARISON:  2018 FINDINGS: Slight levocurvature lumbar spine. Similar mild retrolisthesis at L3-L4 and anterolisthesis at L4-L5. Postoperative changes are again identified at L4-S1 with pedicle screw fixation and interbody  spacers. There may be slightly increased loss of height at the inferior endplate of R67 and superior endplate of L1 compared to CT abdomen from 01/27/2019. There is evidence of prior vertebroplasty at T9. Multilevel degenerative changes are present with disc height loss  endplate osteophytes, and facet hypertrophy. IMPRESSION: Possible increased mild loss of height at the inferior plate T11 and superior endplate of L1 compared to CT abdomen of 01/27/2019 Electronically Signed   By: Macy Mis M.D.   On: 06/02/2019 13:40    Procedures Procedures (including critical care time)  Medications Ordered in ED Medications  morphine 4 MG/ML injection 4 mg (4 mg Intravenous Given 06/02/19 1304)  ondansetron (ZOFRAN) injection 4 mg (4 mg Intravenous Given 06/02/19 1304)  morphine 4 MG/ML injection 4 mg (4 mg Intravenous Given 06/02/19 1412)  methocarbamol (ROBAXIN) 500 mg in dextrose 5 % 50 mL IVPB (0 mg Intravenous Stopped 06/02/19 1512)  HYDROmorphone (DILAUDID) injection 0.5 mg (0.5 mg Intravenous Given 06/02/19 1634)  HYDROmorphone (DILAUDID) injection 0.5 mg (0.5 mg Intravenous Given 06/02/19 1848)  HYDROmorphone (DILAUDID) injection 0.5 mg (0.5 mg Intravenous Given 06/02/19 2004)  LORazepam (ATIVAN) injection 0.5 mg (0.5 mg Intravenous Given 06/02/19 2006)     Initial Impression / Assessment and Plan / ED Course  I have reviewed the triage vital signs and the nursing notes.  Pertinent labs & imaging results that were available during my care of the patient were reviewed by me and considered in my medical decision making (see chart for details).  Clinical Course as of Jun 01 2146  Tue Jun 02, 2019  1358 Some improved pain. Asks that I call her son, [name], to try to get information on patient's orthopedic specialist.    [SJ]  1406 Spoke with [name], patient's son.  He confirmed patient's story of pain increasing last night.  He is unable to tell me the name or location of the orthopedist the patient was supposed to see this morning.  He states I should call his brother, Eddie Dibbles.   [SJ]  [phone] Tried to call Eddie Dibbles, patient's other son. No answer. Left VM with callback number.    [SJ]  21 Spoke with Eddie Dibbles, patient's son.  He states patient has been  complaining of increasing back pain for the last 2 days.   He does not know if the patient fell, however, it would not be uncharacteristic for her to fall and not tell anyone. He adds that her back surgeon, Dr. Patrice Paradise, has advised the patient on multiple occasions that she needs to stay active in order to prevent recurrences of back pain, however, patient mostly "lays around and watches TV."   [SJ]  1522 Attempted to call the office of patient's spine surgeon (with whom she had an appointment this morning), Dr. Patrice Paradise.  I tried multiple times, however, each time I ended up getting disconnected.   [SJ]  1620 Patient states her pain has improved, however, as soon as she tries to move she has severe pain through her back.   [SJ]    Clinical Course User Index [SJ] Tashona Calk C, PA-C       Patient presents with back pain beginning last night.  She does not have evidence of focal neuro deficits.  My suspicion for alternative diagnoses, such as dissection are low based on the location of the patient's pain, its reproducibility, and patient's overall stability. Due to her persistent severe back pain, we decided to pursue MRI.  Findings and plan of care discussed  with Lacretia Leigh, MD. Dr. Zenia Resides personally evaluated and examined this patient.  End of shift patient care handoff report given to Providence Lanius, PA-C. Plan: Labs and MRI pending.  If patient pain is still not able to be controlled, likely admit for pain management.    Final Clinical Impressions(s) / ED Diagnoses   Final diagnoses:  None    ED Discharge Orders    None       Layla Maw 06/02/19 2150    Lacretia Leigh, MD 06/04/19 [phone]

## 2019-06-02 NOTE — ED Notes (Signed)
Pt c/o of 10/10 pain after recent medication administration. EDP made aware.

## 2019-06-02 NOTE — ED Provider Notes (Signed)
Care assumed from [place], [place] at shift change with pending labs and MRI.   In brief, this patient is a 78 y.o. F with PMH/o GERD, HTN, chronic back pain secondary lumbar radiculopathy who resents to the emergency department with worsening back pain began last night.  Patient states she has chronic lower back pain with intermittent flares.  Last night, she was attempting to get comfortable and change positions and started having worsening back pain that has continued to progressively worsen.  Please see note from previous provider for full history/physical exam.  Physical Exam  BP (!) 141/74   Pulse (!) 58   Temp 98.4 F (36.9 C) (Oral)   Resp 18   SpO2 91%   Physical Exam  ED Course/Procedures   Clinical Course as of Jun 01 2341  Tue Jun 02, 2019  1358 Some improved pain. Asks that I call her son, [name], to try to get information on patient's orthopedic specialist.    [SJ]  1406 Spoke with [name], patient's son.  He confirmed patient's story of pain increasing last night.  He is unable to tell me the name or location of the orthopedist the patient was supposed to see this morning.  He states I should call his brother, Eddie Dibbles.   [SJ]  [phone] Tried to call Eddie Dibbles, patient's other son. No answer. Left VM with callback number.    [SJ]  49 Spoke with Eddie Dibbles, patient's son.  He states patient has been complaining of increasing back pain for the last 2 days.   He does not know if the patient fell, however, it would not be uncharacteristic for her to fall and not tell anyone. He adds that her back surgeon, Dr. Patrice Paradise, has advised the patient on multiple occasions that she needs to stay active in order to prevent recurrences of back pain, however, patient mostly "lays around and watches TV."   [SJ]  1522 Attempted to call the office of patient's spine surgeon (with whom she had an appointment this morning), Dr. Patrice Paradise.  I tried multiple times, however, each time I ended up getting disconnected.   [SJ]  1620 Patient states her pain has improved, however, as soon as she tries to move she has severe pain through her back.   [SJ]    Clinical Course User Index [SJ] Joy, Shawn C, PA-C    Procedures  MDM    PLAN: Patient has had multiple rounds of pain medication and attempt to get her pain under control.  Patient still with difficulty walking.  MRIs and lab work are pending.  Plan to admit for pain control.  MDM:  CBC shows leukocytosis of 18.5.  Hemoglobin is 12.6 BMP shows BUN of 25, creatinine 1.46.  MRI shows evidence of T11 L1 compression fractures.  They suspect this is likely subacute.  Otherwise unremarkable.  Given pain control issues and continued difficulty walking, will plan to admit patient.   Discussed patient with Dr. Olevia Bowens who agrees for admission.    1. Compression fracture of T11 vertebra, initial encounter ([place])   2. Closed compression fracture of L1 vertebra, initial encounter ([place])   3. Acute bilateral low back pain, unspecified whether sciatica present     Portions of this note were generated with Dragon dictation software. Dictation errors may occur despite best attempts at proofreading.    Volanda Napoleon, PA-C 06/02/19 2342    Dorie Rank, MD 06/03/19 [phone]

## 2019-06-02 NOTE — Telephone Encounter (Signed)
Referral placed for neuro consult.  [name] [name] or [name] [name] will be contacted directly with the appt date and time.

## 2019-06-03 DIAGNOSIS — G309 Alzheimer's disease, unspecified: Secondary | ICD-10-CM

## 2019-06-03 DIAGNOSIS — S32000A Wedge compression fracture of unspecified lumbar vertebra, initial encounter for closed fracture: Secondary | ICD-10-CM

## 2019-06-03 DIAGNOSIS — M549 Dorsalgia, unspecified: Secondary | ICD-10-CM

## 2019-06-03 DIAGNOSIS — M545 Low back pain, unspecified: Secondary | ICD-10-CM

## 2019-06-03 DIAGNOSIS — S22080A Wedge compression fracture of T11-T12 vertebra, initial encounter for closed fracture: Secondary | ICD-10-CM

## 2019-06-03 DIAGNOSIS — M4855XA Collapsed vertebra, not elsewhere classified, thoracolumbar region, initial encounter for fracture: Secondary | ICD-10-CM | POA: Diagnosis not present

## 2019-06-03 DIAGNOSIS — I10 Essential (primary) hypertension: Secondary | ICD-10-CM

## 2019-06-03 DIAGNOSIS — F0280 Dementia in other diseases classified elsewhere without behavioral disturbance: Secondary | ICD-10-CM

## 2019-06-03 LAB — BASIC METABOLIC PANEL
Anion gap: 10 (ref 5–15)
BUN: 27 mg/dL — ABNORMAL HIGH (ref 8–23)
CO2: 22 mmol/L (ref 22–32)
Calcium: 9 mg/dL (ref 8.9–10.3)
Chloride: 103 mmol/L (ref 98–111)
Creatinine, Ser: 1.6 mg/dL — ABNORMAL HIGH (ref 0.44–1.00)
GFR calc Af Amer: 36 mL/min — ABNORMAL LOW (ref >60)
GFR calc non Af Amer: 31 mL/min — ABNORMAL LOW (ref >60)
Glucose, Bld: 102 mg/dL — ABNORMAL HIGH (ref 70–99)
Potassium: 4.1 mmol/L (ref 3.5–5.1)
Sodium: 135 mmol/L (ref 135–145)

## 2019-06-03 LAB — CBC
HCT: 45.1 % (ref 36.0–46.0)
Hemoglobin: 13.7 g/dL (ref 12.0–15.0)
MCH: 30 pg (ref 26.0–34.0)
MCHC: 30.4 g/dL (ref 30.0–36.0)
MCV: 98.9 fL (ref 80.0–100.0)
Platelets: 421 10*3/uL — ABNORMAL HIGH (ref 150–400)
RBC: 4.56 MIL/uL (ref 3.87–5.11)
RDW: 14.5 % (ref 11.5–15.5)
WBC: 16.5 10*3/uL — ABNORMAL HIGH (ref 4.0–10.5)
nRBC: 0 % (ref 0.0–0.2)

## 2019-06-03 LAB — MAGNESIUM: Magnesium: 1.9 mg/dL (ref 1.7–2.4)

## 2019-06-03 LAB — PHOSPHORUS: Phosphorus: 3.9 mg/dL (ref 2.5–4.6)

## 2019-06-03 LAB — SARS CORONAVIRUS 2 (TAT 6-24 HRS): SARS Coronavirus 2: NEGATIVE

## 2019-06-03 MED ORDER — HYDROCHLOROTHIAZIDE 25 MG PO TABS
25.0000 mg | ORAL_TABLET | Freq: Every day | ORAL | Status: DC
  Administered 2019-06-03: 25 mg via ORAL
  Filled 2019-06-03: qty 1

## 2019-06-03 MED ORDER — METHOCARBAMOL 500 MG PO TABS: 750.0000 mg | ORAL_TABLET | Freq: Three times a day (TID) | ORAL | Status: DC

## 2019-06-03 MED ORDER — HYDROMORPHONE HCL 1 MG/ML IJ SOLN: 0.5000 mg | INTRAMUSCULAR | Status: DC | PRN

## 2019-06-03 MED ORDER — ACETAMINOPHEN 650 MG RE SUPP: 650.0000 mg | Freq: Four times a day (QID) | RECTAL | Status: DC | PRN

## 2019-06-03 MED ORDER — ENOXAPARIN SODIUM 40 MG/0.4ML ~~LOC~~ SOLN: 40.0000 mg | SUBCUTANEOUS | Status: DC

## 2019-06-03 MED ORDER — FLUOXETINE HCL 20 MG PO CAPS
20.0000 mg | ORAL_CAPSULE | Freq: Every day | ORAL | Status: DC
  Administered 2019-06-03: 20 mg via ORAL
  Filled 2019-06-03: qty 1

## 2019-06-03 MED ORDER — OXYCODONE HCL 5 MG PO TABS: 5.0000 mg | ORAL_TABLET | ORAL | 0 refills | Status: DC | PRN

## 2019-06-03 MED ORDER — METHOCARBAMOL 500 MG PO TABS
750.0000 mg | ORAL_TABLET | Freq: Four times a day (QID) | ORAL | Status: DC | PRN
  Administered 2019-06-03 (×2): 750 mg via ORAL
  Filled 2019-06-03 (×2): qty 2

## 2019-06-03 MED ORDER — OXYCODONE HCL 5 MG PO TABS
5.0000 mg | ORAL_TABLET | ORAL | Status: DC | PRN
  Administered 2019-06-03 (×2): 5 mg via ORAL
  Filled 2019-06-03 (×2): qty 1

## 2019-06-03 MED ORDER — ENOXAPARIN SODIUM 30 MG/0.3ML ~~LOC~~ SOLN
30.0000 mg | SUBCUTANEOUS | Status: DC
  Administered 2019-06-03: 30 mg via SUBCUTANEOUS
  Filled 2019-06-03: qty 0.3

## 2019-06-03 MED ORDER — URSODIOL 300 MG PO CAPS
300.0000 mg | ORAL_CAPSULE | Freq: Two times a day (BID) | ORAL | Status: DC
  Administered 2019-06-03: 300 mg via ORAL
  Filled 2019-06-03 (×3): qty 1

## 2019-06-03 MED ORDER — METHOCARBAMOL 750 MG PO TABS: 750.0000 mg | ORAL_TABLET | Freq: Four times a day (QID) | ORAL | 0 refills | Status: DC | PRN

## 2019-06-03 MED ORDER — PREDNISONE 20 MG PO TABS
20.0000 mg | ORAL_TABLET | Freq: Every day | ORAL | Status: DC
  Administered 2019-06-03: 20 mg via ORAL
  Filled 2019-06-03: qty 1

## 2019-06-03 MED ORDER — DONEPEZIL HCL 10 MG PO TABS: 5.0000 mg | ORAL_TABLET | Freq: Every day | ORAL | Status: DC

## 2019-06-03 MED ORDER — ENSURE ENLIVE PO LIQD
237.0000 mL | Freq: Two times a day (BID) | ORAL | Status: DC
  Administered 2019-06-03 (×2): 237 mL via ORAL

## 2019-06-03 MED ORDER — PROCHLORPERAZINE EDISYLATE 10 MG/2ML IJ SOLN: 5.0000 mg | INTRAMUSCULAR | Status: DC | PRN

## 2019-06-03 MED ORDER — AMLODIPINE BESYLATE 10 MG PO TABS
10.0000 mg | ORAL_TABLET | Freq: Every day | ORAL | Status: DC
  Administered 2019-06-03: 10 mg via ORAL
  Filled 2019-06-03: qty 1

## 2019-06-03 MED ORDER — ACETAMINOPHEN 325 MG PO TABS
650.0000 mg | ORAL_TABLET | Freq: Four times a day (QID) | ORAL | Status: DC | PRN
  Administered 2019-06-03: 650 mg via ORAL
  Filled 2019-06-03: qty 2

## 2019-06-03 NOTE — H&P (Signed)
History and Physical    [name] [name] DDU:[id number] DOB: [birth year] DOA: 06/02/2019  PCP: Hoyt Koch, MD   Patient coming from: Home.  I have personally briefly reviewed patient's old medical records in [place]  Chief Complaint: Back pain.  HPI: [name] [name] is a 78 y.o. female with medical history significant of hayfever, osteoarthritis of the shoulder, history of Bell's palsy, history of remote blood transfusion, colon polyps, Crohn's disease, GERD, migraine headaches, hypertension, history of major depression disorder who is coming to the emergency department due to progressively worsening back pain for the past 2 weeks, but particularly very intense over the last 3 days.  She has some numbness radiating to her LLE.  She denies any history of falls or any other trauma.  She denies fever, chills or night sweats.  She denies fecal or urinary incontinence.  She denies dyspnea, chest pain, palpitations, dizziness, diaphoresis, PND orthopnea.  She occasionally gets lower extremity edema.  She denies abdominal pain, nausea or vomiting, diarrhea, constipation, melena or hematochezia.  No dysuria, frequency hematuria.  No polyuria, polydipsia, polyphagia or blurred vision.  ED Course: Initial vital signs temperature 98.2 F, pulse 74, respiration 18, blood pressure 147/87 mmHg and O2 sat 98% on room air.  The patient was given 500 mg of methocarbamol IVPB, ondansetron 4 mg IVPB x1, morphine 4 mg IVP x1, lorazepam 0.5 mg IVP x1 and hydromorphone 0.5 mg IVP x1.  White count is 18.5, hemoglobin 12.6 g/dL and platelets 417.  BMP shows normal electrolytes.  Glucose 124, calcium 8.6, BUN 25 creatinine 1.46 mg/dL.    Imaging shows compression fractures of T11 and L1 with less than 25% height loss and mild edema.  Likely there is to vacuum.  L4-S1 PLIF with widely patent spinal canal.  There is removal Baytril on mentation of T7 and T9.  Please see images and full cellular report for  further detail.  Review of Systems: As per HPI otherwise 10 point review of systems negative.   Past Medical History:  Diagnosis Date   Allergy    year around allergies    Arthritis    shoulder   Bell's palsy    Blood transfusion    1966   Blood transfusion without reported diagnosis    Cataract    bilaterally removed   Colon polyps    adenomatous polyp June 2376   Complication of anesthesia    slow to wake   Crohn's colitis ([place])    Dry eyes    left eye worst one   GERD (gastroesophageal reflux disease)    Headache(784.0)    hx migraines   Hypertension    Protein-calorie malnutrition ([place]) 03/06/2019   Severe major depression ([place]) 03/03/2019   Shortness of breath     Past Surgical History:  Procedure Laterality Date   ABDOMINAL HYSTERECTOMY     APPENDECTOMY     CATARACT EXTRACTION Bilateral    Separate dates   CHOLECYSTECTOMY     COLONOSCOPY  multiple   flex sigmoidoscopy with biospy  01/17/12   FLEXIBLE SIGMOIDOSCOPY  06/08/2011   severe Crohn's colitis to descending colon at least   [place]     left   SHOULDER SURGERY     right   TONSILLECTOMY       reports that she quit smoking about 50 years ago. Her smoking use included cigarettes. She has a 2.50 pack-year smoking history. She has never used smokeless tobacco. She reports previous alcohol  use. She reports that she does not use drugs.  Allergies  Allergen Reactions   Azathioprine Nausea Only    Elevated lipase also - ? Pancreatitis    Entyvio [Vedolizumab] Shortness Of Breath   Adhesive [Tape] Rash    Taking skin off   Sulfa Antibiotics Itching and Rash    Family History  Problem Relation Age of Onset   Leukemia Mother    Heart disease Mother    Heart disease Father    Heart attack Father    Ovarian cancer Paternal Aunt        1/2 aunt   Breast cancer Paternal 34        1/2 aunt   Throat cancer Paternal Aunt        1/2 aunt    Parkinsonism Brother    Heart disease Brother    Leukemia Maternal Grandfather    Anesthesia problems Neg Hx    Hypotension Neg Hx    Malignant hyperthermia Neg Hx    Pseudochol deficiency Neg Hx    Colon cancer Neg Hx    Diabetes Neg Hx    Esophageal cancer Neg Hx    Rectal cancer Neg Hx    Stomach cancer Neg Hx    Prior to Admission medications   Medication Sig Start Date End Date Taking? Authorizing Provider  acetaminophen (TYLENOL) 325 MG tablet Take 650 mg by mouth every 6 (six) hours as needed for moderate pain.   Yes [provider]  amLODipine (NORVASC) 10 MG tablet Take 1 tablet (10 mg total) by mouth daily. 04/24/19  Yes Hoyt Koch, MD  B Complex-Folic Acid (B COMPLEX FORMULA 1) TABS Take 1 tablet by mouth daily.   Yes [provider]  donepezil (ARICEPT) 5 MG tablet Take 1 tablet (5 mg total) by mouth at bedtime. 04/24/19  Yes Hoyt Koch, MD  FLUoxetine (PROZAC) 20 MG capsule Take 1 capsule (20 mg total) by mouth daily. 04/21/19  Yes [name], MD  hydrochlorothiazide (HYDRODIURIL) 25 MG tablet Take 1 tablet (25 mg total) by mouth daily. 04/21/19  Yes [name], MD  Multiple Vitamin (MULTIVITAMIN WITH MINERALS) TABS tablet Take 1 tablet by mouth daily.   Yes [provider]  predniSONE (DELTASONE) 10 MG tablet Take 2 tablets (20 mg total) by mouth daily with breakfast. 04/03/19  Yes [name], MD  triamcinolone cream (KENALOG) 0.1 % Apply 1 application topically 2 (two) times daily. 04/24/19  Yes Hoyt Koch, MD  ursodiol (ACTIGALL) 300 MG capsule Take 1 capsule (300 mg total) by mouth 2 (two) times daily. 04/03/19  Yes [name], MD  mirtazapine (REMERON) 15 MG tablet Take 1 tablet (15 mg total) by mouth at bedtime. Patient not taking: Reported on 06/02/2019 04/03/19   [name], MD  pantoprazole (PROTONIX) 40 MG tablet Take 1 tablet (40 mg total) by mouth daily. Patient not taking:  Reported on 06/02/2019 02/03/19 05/10/21  Barb Merino, MD  pravastatin (PRAVACHOL) 20 MG tablet Take 1 tablet (20 mg total) by mouth daily. Patient not taking: Reported on 06/02/2019 04/29/19   Hoyt Koch, MD    Physical Exam: Vitals:   06/02/19 1930 06/02/19 2230 06/02/19 2300 06/02/19 2330  BP: 138/68 (!) 143/75 133/72 (!) 141/74  Pulse: 70 (!) 58 (!) 55 (!) 58  Resp: (!) 21 20 20 18   Temp:      TempSrc:      SpO2: 95% 94% 94% 91%  Constitutional: NAD, calm, comfortable Eyes: PERRL, lids and conjunctivae normal ENMT: Mucous membranes are moist. Posterior pharynx clear of any exudate or lesions. Neck: normal, supple, no masses, no thyromegaly Respiratory: Decreased breath sounds in bases, otherwise clear to auscultation bilaterally, no wheezing, no crackles. Normal respiratory effort. No accessory muscle use.  Cardiovascular: Regular rate and rhythm, no murmurs / rubs / gallops. No extremity edema. 2+ pedal pulses. No carotid bruits.  Abdomen: Nondistended.  Bowel sounds positive.  Soft, no tenderness, no masses palpated. No hepatosplenomegaly. Musculoskeletal: no clubbing / cyanosis.  Significant decrease in lower and mid back ROM.  Her pain increases if she raises her LLE.  Increased paraspinal muscle tone with point tenderness.  Skin: Small areas of ecchymosis from venipunctures. Neurologic: CN 2-12 grossly intact. Sensation intact, DTR normal.  Mild generalized weakness. Psychiatric: Normal judgment and insight. Alert and oriented x 3. Normal mood.   Labs on Admission: I have personally reviewed following labs and imaging studies  CBC: Recent Labs  Lab 06/02/19 2231  WBC 18.5*  NEUTROABS 15.3*  HGB 12.6  HCT 39.7  MCV 97.8  PLT 700*   Basic Metabolic Panel: Recent Labs  Lab 06/02/19 2231  NA 138  K 3.6  CL 105  CO2 23  GLUCOSE 124*  BUN 25*  CREATININE 1.46*  CALCIUM 8.6*   GFR: Estimated Creatinine Clearance: 32.7 mL/min (A) (by C-G formula  based on SCr of 1.46 mg/dL (H)). Liver Function Tests: No results for input(s): AST, ALT, ALKPHOS, BILITOT, PROT, ALBUMIN in the last 168 hours. No results for input(s): LIPASE, AMYLASE in the last 168 hours. No results for input(s): AMMONIA in the last 168 hours. Coagulation Profile: No results for input(s): INR, PROTIME in the last 168 hours. Cardiac Enzymes: No results for input(s): CKTOTAL, CKMB, CKMBINDEX, TROPONINI in the last 168 hours. BNP (last 3 results) No results for input(s): PROBNP in the last 8760 hours. HbA1C: No results for input(s): HGBA1C in the last 72 hours. CBG: No results for input(s): GLUCAP in the last 168 hours. Lipid Profile: No results for input(s): CHOL, HDL, LDLCALC, TRIG, CHOLHDL, LDLDIRECT in the last 72 hours. Thyroid Function Tests: No results for input(s): TSH, T4TOTAL, FREET4, T3FREE, THYROIDAB in the last 72 hours. Anemia Panel: No results for input(s): VITAMINB12, FOLATE, FERRITIN, TIBC, IRON, RETICCTPCT in the last 72 hours. Urine analysis:    Component Value Date/Time   COLORURINE YELLOW 01/27/2019 1703   APPEARANCEUR CLEAR 01/27/2019 1703   LABSPEC 1.012 01/27/2019 1703   PHURINE 6.0 01/27/2019 1703   GLUCOSEU NEGATIVE 01/27/2019 1703   HGBUR LARGE (A) 01/27/2019 1703   BILIRUBINUR NEGATIVE 01/27/2019 1703   KETONESUR NEGATIVE 01/27/2019 1703   PROTEINUR NEGATIVE 01/27/2019 1703   UROBILINOGEN 0.2 12/13/2014 1506   NITRITE NEGATIVE 01/27/2019 1703   LEUKOCYTESUR NEGATIVE 01/27/2019 1703    Radiological Exams on Admission: Dg Lumbar Spine Complete  Result Date: 06/02/2019 CLINICAL DATA:  Increased midline back pain EXAM: LUMBAR SPINE - COMPLETE 4+ VIEW COMPARISON:  2018 FINDINGS: Slight levocurvature lumbar spine. Similar mild retrolisthesis at L3-L4 and anterolisthesis at L4-L5. Postoperative changes are again identified at L4-S1 with pedicle screw fixation and interbody spacers. There may be slightly increased loss of height at the  inferior endplate of F74 and superior endplate of L1 compared to CT abdomen from 01/27/2019. There is evidence of prior vertebroplasty at T9. Multilevel degenerative changes are present with disc height loss endplate osteophytes, and facet hypertrophy. IMPRESSION: Possible increased mild loss of height at the  inferior plate T11 and superior endplate of L1 compared to CT abdomen of 01/27/2019 Electronically Signed   By: Macy Mis M.D.   On: 06/02/2019 13:40   Mr Thoracic Spine Wo Contrast  Result Date: 06/02/2019 CLINICAL DATA:  Severe back pain. History of lumbosacral fusion. EXAM: MRI THORACIC AND LUMBAR SPINE WITHOUT CONTRAST TECHNIQUE: Multiplanar and multiecho pulse sequences of the thoracic and lumbar spine were obtained without intravenous contrast. COMPARISON:  None. FINDINGS: MRI THORACIC SPINE FINDINGS Alignment:  Physiologic. Vertebrae: Remote vertebral augmentation at T7 and T9. T11 compression fracture with less than 25% height loss and mild edema at the inferior endplate. Cord:  Normal signal and morphology. Paraspinal and other soft tissues: Negative Disc levels: No spinal canal stenosis. No disc herniation. MRI LUMBAR SPINE FINDINGS Segmentation:  Normal Alignment:  Grade 1 anterolisthesis at L4-5 Vertebrae: L4-S1 PLIF. There is a mild compression fracture of L1 with less than 25% height loss and mild edema beneath the superior endplate. Conus medullaris and cauda equina: Conus extends to the L2 level. Conus and cauda equina appear normal. Paraspinal and other soft tissues: Posterior paraspinal surgical changes at L4-S1. Disc levels: T12-L1: Normal disc space and facets. No spinal canal or neuroforaminal stenosis. L1-L2: Mild disc bulge without spinal canal or neural foraminal stenosis. Normal facets. L2-L3: Disc space narrowing with mild bulge. Mild left foraminal stenosis. No spinal canal stenosis. L3-L4: Left eccentric disc bulge with narrowing of the left lateral recess. No central  spinal canal or neural foraminal stenosis. L4-L5: Grade 1 anterolisthesis. Status post PLIF. Spinal canal is widely patent. No neural impingement. L5-S1: Postfusion changes widely patent spinal canal. Small postsurgical fluid collection posteriorly, likely seroma. Mild left foraminal narrowing. Visualized sacrum: Normal. IMPRESSION: 1. Compression fractures of T11 and L1 with less than 25% height loss and mild edema. Likely subacute timeline. 2. L4-S1 PLIF with widely patent spinal canal. 3. Remote vertebral augmentation at T7 and T9. Electronically Signed   By: Ulyses Jarred M.D.   On: 06/02/2019 22:35   Mr Lumbar Spine Wo Contrast  Result Date: 06/02/2019 CLINICAL DATA:  Severe back pain. History of lumbosacral fusion. EXAM: MRI THORACIC AND LUMBAR SPINE WITHOUT CONTRAST TECHNIQUE: Multiplanar and multiecho pulse sequences of the thoracic and lumbar spine were obtained without intravenous contrast. COMPARISON:  None. FINDINGS: MRI THORACIC SPINE FINDINGS Alignment:  Physiologic. Vertebrae: Remote vertebral augmentation at T7 and T9. T11 compression fracture with less than 25% height loss and mild edema at the inferior endplate. Cord:  Normal signal and morphology. Paraspinal and other soft tissues: Negative Disc levels: No spinal canal stenosis. No disc herniation. MRI LUMBAR SPINE FINDINGS Segmentation:  Normal Alignment:  Grade 1 anterolisthesis at L4-5 Vertebrae: L4-S1 PLIF. There is a mild compression fracture of L1 with less than 25% height loss and mild edema beneath the superior endplate. Conus medullaris and cauda equina: Conus extends to the L2 level. Conus and cauda equina appear normal. Paraspinal and other soft tissues: Posterior paraspinal surgical changes at L4-S1. Disc levels: T12-L1: Normal disc space and facets. No spinal canal or neuroforaminal stenosis. L1-L2: Mild disc bulge without spinal canal or neural foraminal stenosis. Normal facets. L2-L3: Disc space narrowing with mild bulge. Mild  left foraminal stenosis. No spinal canal stenosis. L3-L4: Left eccentric disc bulge with narrowing of the left lateral recess. No central spinal canal or neural foraminal stenosis. L4-L5: Grade 1 anterolisthesis. Status post PLIF. Spinal canal is widely patent. No neural impingement. L5-S1: Postfusion changes widely patent spinal canal. Small postsurgical  fluid collection posteriorly, likely seroma. Mild left foraminal narrowing. Visualized sacrum: Normal. IMPRESSION: 1. Compression fractures of T11 and L1 with less than 25% height loss and mild edema. Likely subacute timeline. 2. L4-S1 PLIF with widely patent spinal canal. 3. Remote vertebral augmentation at T7 and T9. Electronically Signed   By: Ulyses Jarred M.D.   On: 06/02/2019 22:35    EKG: Independently reviewed.   Assessment/Plan Principal problem:   Intractable back pain   Lumbar compression fracture, closed, initial encounter ([place])   Thoracic compression fracture ([place]) Observation/MedSurg. Oxycodone 5 mg p.o. every 4 hours as needed. Hydromorphone 0.5 mg IVP every 4 hours as needed for severe pain. Methocarbamol 750 p.o. every 6 hours as needed. ET CO2 monitoring. Consult case management in a.m. May need to consult PT/OT if no improvement.  Active Problems:   Hypertension Continue amlodipine 10 mg p.o. daily. Continue HCTZ 25 mg p.o. daily. Monitor BP, renal function electrolytes.    Esophageal reflux Protonix 40 mg p.o. daily.    Dementia (HCC) Continue Aricept 5 mg p.o. bedtime.    Thrombocytosis (HCC) Monitor platelet count.    CKD (chronic kidney disease) Around recent baseline. Monitor renal function.    Leukocytosis Secondary to prednisone use.     DVT prophylaxis: Lovenox SQ. Code Status: Full code. Family Communication: Disposition Plan: Observation for pain control. Consults called:  Admission status: Inpatient/stepdown   Reubin Milan MD [facility]  If 7PM-7AM, please contact  night-coverage [email or web address]  06/03/2019, 12:22 AM   This document was prepared using Dragon voice recognition software and may contain some unintended transcription errors.

## 2019-06-03 NOTE — Progress Notes (Signed)
Initial Nutrition Assessment  RD working remotely.  DOCUMENTATION CODES:   Not applicable  INTERVENTION:   - Ensure Enlive po BID, each supplement provides 350 kcal and 20 grams of protein  - Encourage adequate PO intake  NUTRITION DIAGNOSIS:   Increased nutrient needs related to acute illness as evidenced by estimated needs.  GOAL:   Patient will meet greater than or equal to 90% of their needs  MONITOR:   PO intake, Supplement acceptance, Weight trends  REASON FOR ASSESSMENT:   Malnutrition Screening Tool    ASSESSMENT:   78 year old female who presented to the ED on 11/10 with back pain. PMH GERD, HTN, Crohn's, Bell's palsy. MRI showing compression fractures of T11 and L1.   Unable to reach pt via phone call to room. Busy signal received on all attempts. Will attempt to obtain diet and weight history at follow-up.  Reviewed weight readings in chart. Noted pt with a 9.5 kg weight loss since 01/22/19. This is an 11.9% weight loss in less than 5 months which is significant for timeframe. If this is true weight loss, pt is at risk for malnutrition. RD unable to confirm malnutrition without diet history and/or NFPE.  Per RN edema assessment, pt with moderate pitting edema to BLE which could be masking additional weight loss.  Given weight loss, RD will order oral nutrition supplements to aid pt in meeting kcal and protein needs.  Meal Completion: 100% x 1 meal  Medications reviewed and include: Prednisone  Labs reviewed: BUN 27  NUTRITION - FOCUSED PHYSICAL EXAM:  Unable to complete at this time. RD working remotely.  Diet Order:   Diet Order            Diet regular Room service appropriate? Yes; Fluid consistency: Thin  Diet effective now              EDUCATION NEEDS:   No education needs have been identified at this time  Skin:  Skin Assessment: Reviewed RN Assessment  Last BM:  06/02/19  Height:   Ht Readings from Last 1 Encounters:  06/03/19  5' 5"  (1.651 m)    Weight:   Wt Readings from Last 1 Encounters:  06/03/19 70.3 kg    Ideal Body Weight:  56.8 kg  BMI:  Body mass index is 25.79 kg/m.  Estimated Nutritional Needs:   Kcal:  1500-1700  Protein:  75-85 grams  Fluid:  >/= 1.5 L    Gaynell Face, MS, RD, LDN Inpatient Clinical Dietitian Pager: [phone] Weekend/After Hours: [phone]

## 2019-06-03 NOTE — Progress Notes (Addendum)
OT Cancellation Note  Patient Details Name: Florence MRN: [id number] DOB: [birth year]   Cancelled Treatment:    Reason Eval/Treat Not Completed: Other (comment) OT spoke with pt who stated she would have A at home with all ADL activity (LB dressing)  between her sister and son.  Noted plan for DC today.  Recommend HHOT and 24/7 A initially.  Will defer OT eval to [place].  Kari Baars, OT Acute Rehabilitation Services [phone] Office- [phone], Thereasa Parkin 06/03/2019, 4:21 PM

## 2019-06-03 NOTE — TOC Initial Note (Signed)
Transition of Care [facility]) - Initial/Assessment Note    Patient Details  Name: [place] MRN: [id number] Date of Birth: 07/31/1940  Transition of Care [facility]) CM/SW Contact:    Lia Hopping, [facility] Phone Number: 06/03/2019, 3:38 PM  Clinical Narrative:                 Patient admitted for back pain, Home Heath recommended for continued therapy. CSW met with the patient at bedside and reached out to her son [name] whom she lives with by phone. CSW provided a list of Home Health agencies from [email or web address]. Patient chose [facility].  Patient son confirm the patient has DME-RW.  No other needs identified.    Expected Discharge Plan: [place] Barriers to Discharge: No Barriers Identified   Patient Goals and CMS Choice Patient states their goals for this hospitalization and ongoing recovery are:: " I have done therapy at home before and it went well. I am okay with doing it again." CMS Medicare.gov Compare Post Acute Care list provided to:: Patient Choice offered to / list presented to : Patient  Expected Discharge Plan and Services Expected Discharge Plan: [place] In-house Referral: Clinical Social Work     Living arrangements for the past 2 months: [place] Expected Discharge Date: 06/03/19                         HH Arranged: PT, OT HH Agency: [place] Date [facility] Agency Contacted: 06/03/19 Time HH Agency Contacted: [place] Representative spoke with at [place]: Georgina Snell  Prior Living Arrangements/Services Living arrangements for the past 2 months: [place] Lives with:: Self Patient language and need for interpreter reviewed:: Yes Do you feel safe going back to the place where you live?: Yes      Need for Family Participation in Patient Care: Yes (Comment) Care giver support system in place?: Yes (comment) Current home services: DME Criminal Activity/Legal Involvement Pertinent to Current  Situation/Hospitalization: No - Comment as needed  Activities of Daily Living Home Assistive Devices/Equipment: Cane (specify quad or straight), Grab bars in shower, Raised toilet seat with rails(straight cane) ADL Screening (condition at time of admission) Patient's cognitive ability adequate to safely complete daily activities?: Yes Is the patient deaf or have difficulty hearing?: Yes(Occasionally HOH) Does the patient have difficulty seeing, even when wearing glasses/contacts?: No Does the patient have difficulty concentrating, remembering, or making decisions?: Yes Patient able to express need for assistance with ADLs?: Yes Does the patient have difficulty dressing or bathing?: No Independently performs ADLs?: Yes (appropriate for developmental age) Does the patient have difficulty walking or climbing stairs?: No Weakness of Legs: Both Weakness of Arms/Hands: None  Permission Sought/Granted Permission sought to share information with : Family Supports Permission granted to share information with : Yes, Verbal Permission Granted     Permission granted to share info w AGENCY: [place] granted to share info w Relationship: Son     Emotional Assessment Appearance:: Appears stated age Attitude/Demeanor/Rapport: Engaged Affect (typically observed): Accepting Orientation: : Oriented to Self, Oriented to Place, Oriented to  Time, Oriented to Situation Alcohol / Substance Use: Not Applicable Psych Involvement: No (comment)  Admission diagnosis:  Acute bilateral low back pain, unspecified whether sciatica present [M54.5] Compression fracture of T11 vertebra, initial encounter ([place]) [S22.080A] Closed compression fracture of L1 vertebra, initial encounter ([place]) [S32.010A] Patient Active Problem List   Diagnosis  Date Noted  . Intractable back pain 06/03/2019  . Acute bilateral low back pain   . Lumbar compression fracture, closed, initial encounter ([place]) 06/02/2019  .  Thoracic compression fracture ([place]) 06/02/2019  . CKD (chronic kidney disease) 06/02/2019  . Leukocytosis 06/02/2019  . 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 Protein-calorie malnutrition ([place]) 03/06/2019  . 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 Venous insufficiency 01/28/2018  . Routine general medical examination at a health care facility 01/10/2018  . Iron deficiency anemia due to chronic blood loss 12/18/2017  . Thrombocytosis ([place]) 12/11/2017  . Dementia ([place]) 11/01/2017  . Lumbar radiculopathy 12/31/2016  . Neck pain 03/05/2016  . Cervical paraspinal muscle spasm 03/01/2016  . H/O Bell's palsy 01/09/2016  . 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 Cough variant asthma vs uacs vs bronchiectasis 01/14/2015  . Hyperglycemia 10/11/2014  . Esophageal reflux 09/22/2014  . 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 Immunosuppression - on chronic prednisone, hx of Remicade and azathioprine 07/09/2011  . Crohn's disease of colon with rectal bleeding ([place])   . Hypertension   . Arthritis    PCP:  Hoyt Koch, MD Pharmacy:   [id number] [place], [place] - [address] [facility] [address]Phone: [phone]: [phone]    Social Determinants of Health (SDOH) Interventions    Readmission Risk Interventions No flowsheet data found.

## 2019-06-03 NOTE — Discharge Summary (Signed)
Physician Discharge Summary  [place] TXM:[id number] DOB: [birth year] DOA: 06/02/2019  PCP: Hoyt Koch, MD  Admit date: 06/02/2019 Discharge date: 06/03/2019  Admitted From: Home Disposition: Home  Recommendations for Outpatient Follow-up:  1. Follow up with PCP in 1-2 weeks 2. Please obtain BMP/CBC in one week 3. Please follow up on the following pending results: None  Home Health: Yes Equipment/Devices: None Discharge Condition: Stable CODE STATUS: Full Diet recommendation: Heart Healthy   Brief/Interim Summary: [name] [name] is a 78 y.o. female with medical history significant of hayfever, osteoarthritis of the shoulder, history of Bell's palsy, history of remote blood transfusion, colon polyps, Crohn's disease, GERD, migraine headaches, hypertension, history of major depression disorder who is coming to the emergency department due to progressively worsening back pain for the past 2 weeks, but particularly very intense over the last 3 days.  She has some numbness radiating to her LLE.  MRI thoracic and lumbar spine shows new compression fractures at T12 and L1. Patient was on long term steroid and was on bisphosphonate holiday.  Her pain was initially controlled with IV Dilaudid and muscle relaxant.  Later she was comfortable with oxycodone and methocarbamol. PT was recommending home health physical therapy which was ordered. Patient was discharged home on oxycodone and methocarbamol for few days and advised to contact her PCP regarding restarting bisphosphonates.  We continued her antihypertensives, Protonix and Aricept during hospitalization.  Discharge Diagnoses:  Active Problems:   Hypertension   Esophageal reflux   Dementia (HCC)   Thrombocytosis (HCC)   Lumbar compression fracture, closed, initial encounter [facility])   Thoracic compression fracture (HCC)   CKD (chronic kidney disease)   Leukocytosis   Intractable back pain   Acute bilateral low back  pain  Discharge Instructions  Discharge Instructions    Call MD for:  severe uncontrolled pain   Complete by: As directed    Diet - low sodium heart healthy   Complete by: As directed    Discharge instructions   Complete by: As directed    It was pleasure taking care of you. We ordered some home physical therapy for your back pain. I am also giving you some pain medicine to be taken as needed along with muscle relaxant.  Combination of these 2 can make you drowsy and unstable.  Please be conscious about falls. These pain medicine can make you constipated.  Increase your fiber intake and use MiraLAX if needed. Please follow-up with your primary care physician within 1 to 2 weeks.  They will need to start you on bisphosphonate therapy due to your recent fractures.   Increase activity slowly   Complete by: As directed      Allergies as of 06/03/2019      Reactions   Azathioprine Nausea Only   Elevated lipase also - ? Pancreatitis   Entyvio [vedolizumab] Shortness Of Breath   Adhesive [tape] Rash   Taking skin off   Sulfa Antibiotics Itching, Rash      Medication List    TAKE these medications   acetaminophen 325 MG tablet Commonly known as: TYLENOL Take 650 mg by mouth every 6 (six) hours as needed for moderate pain.   amLODipine 10 MG tablet Commonly known as: NORVASC Take 1 tablet (10 mg total) by mouth daily.   B Complex Formula 1 Tabs Take 1 tablet by mouth daily.   donepezil 5 MG tablet Commonly known as: Aricept Take 1 tablet (5 mg total) by mouth at bedtime.  FLUoxetine 20 MG capsule Commonly known as: PROZAC Take 1 capsule (20 mg total) by mouth daily.   hydrochlorothiazide 25 MG tablet Commonly known as: HYDRODIURIL Take 1 tablet (25 mg total) by mouth daily.   methocarbamol 750 MG tablet Commonly known as: ROBAXIN Take 1 tablet (750 mg total) by mouth every 6 (six) hours as needed for muscle spasms.   mirtazapine 15 MG tablet Commonly known as:  Remeron Take 1 tablet (15 mg total) by mouth at bedtime.   multivitamin with minerals Tabs tablet Take 1 tablet by mouth daily.   oxyCODONE 5 MG immediate release tablet Commonly known as: Oxy IR/ROXICODONE Take 1 tablet (5 mg total) by mouth every 4 (four) hours as needed for moderate pain.   pantoprazole 40 MG tablet Commonly known as: PROTONIX Take 1 tablet (40 mg total) by mouth daily.   pravastatin 20 MG tablet Commonly known as: PRAVACHOL Take 1 tablet (20 mg total) by mouth daily.   predniSONE 10 MG tablet Commonly known as: DELTASONE Take 2 tablets (20 mg total) by mouth daily with breakfast.   triamcinolone cream 0.1 % Commonly known as: KENALOG Apply 1 application topically 2 (two) times daily.   ursodiol 300 MG capsule Commonly known as: ACTIGALL Take 1 capsule (300 mg total) by mouth 2 (two) times daily.      Follow-up Information    Hoyt Koch, MD. Schedule an appointment as soon as possible for a visit.   Specialty: Internal Medicine Contact information: [place] [address] [phone]          Allergies  Allergen Reactions  . Azathioprine Nausea Only    Elevated lipase also - ? Pancreatitis   . Entyvio [Vedolizumab] Shortness Of Breath  . Adhesive [Tape] Rash    Taking skin off  . Sulfa Antibiotics Itching and Rash    Procedures/Studies: Dg Lumbar Spine Complete  Result Date: 06/02/2019 CLINICAL DATA:  Increased midline back pain EXAM: LUMBAR SPINE - COMPLETE 4+ VIEW COMPARISON:  2018 FINDINGS: Slight levocurvature lumbar spine. Similar mild retrolisthesis at L3-L4 and anterolisthesis at L4-L5. Postoperative changes are again identified at L4-S1 with pedicle screw fixation and interbody spacers. There may be slightly increased loss of height at the inferior endplate of P50 and superior endplate of L1 compared to CT abdomen from 01/27/2019. There is evidence of prior vertebroplasty at T9. Multilevel degenerative  changes are present with disc height loss endplate osteophytes, and facet hypertrophy. IMPRESSION: Possible increased mild loss of height at the inferior plate T11 and superior endplate of L1 compared to CT abdomen of 01/27/2019 Electronically Signed   By: Macy Mis M.D.   On: 06/02/2019 13:40   Mr Thoracic Spine Wo Contrast  Result Date: 06/02/2019 CLINICAL DATA:  Severe back pain. History of lumbosacral fusion. EXAM: MRI THORACIC AND LUMBAR SPINE WITHOUT CONTRAST TECHNIQUE: Multiplanar and multiecho pulse sequences of the thoracic and lumbar spine were obtained without intravenous contrast. COMPARISON:  None. FINDINGS: MRI THORACIC SPINE FINDINGS Alignment:  Physiologic. Vertebrae: Remote vertebral augmentation at T7 and T9. T11 compression fracture with less than 25% height loss and mild edema at the inferior endplate. Cord:  Normal signal and morphology. Paraspinal and other soft tissues: Negative Disc levels: No spinal canal stenosis. No disc herniation. MRI LUMBAR SPINE FINDINGS Segmentation:  Normal Alignment:  Grade 1 anterolisthesis at L4-5 Vertebrae: L4-S1 PLIF. There is a mild compression fracture of L1 with less than 25% height loss and mild edema beneath the superior endplate.  Conus medullaris and cauda equina: Conus extends to the L2 level. Conus and cauda equina appear normal. Paraspinal and other soft tissues: Posterior paraspinal surgical changes at L4-S1. Disc levels: T12-L1: Normal disc space and facets. No spinal canal or neuroforaminal stenosis. L1-L2: Mild disc bulge without spinal canal or neural foraminal stenosis. Normal facets. L2-L3: Disc space narrowing with mild bulge. Mild left foraminal stenosis. No spinal canal stenosis. L3-L4: Left eccentric disc bulge with narrowing of the left lateral recess. No central spinal canal or neural foraminal stenosis. L4-L5: Grade 1 anterolisthesis. Status post PLIF. Spinal canal is widely patent. No neural impingement. L5-S1: Postfusion  changes widely patent spinal canal. Small postsurgical fluid collection posteriorly, likely seroma. Mild left foraminal narrowing. Visualized sacrum: Normal. IMPRESSION: 1. Compression fractures of T11 and L1 with less than 25% height loss and mild edema. Likely subacute timeline. 2. L4-S1 PLIF with widely patent spinal canal. 3. Remote vertebral augmentation at T7 and T9. Electronically Signed   By: Ulyses Jarred M.D.   On: 06/02/2019 22:35   Mr Lumbar Spine Wo Contrast  Result Date: 06/02/2019 CLINICAL DATA:  Severe back pain. History of lumbosacral fusion. EXAM: MRI THORACIC AND LUMBAR SPINE WITHOUT CONTRAST TECHNIQUE: Multiplanar and multiecho pulse sequences of the thoracic and lumbar spine were obtained without intravenous contrast. COMPARISON:  None. FINDINGS: MRI THORACIC SPINE FINDINGS Alignment:  Physiologic. Vertebrae: Remote vertebral augmentation at T7 and T9. T11 compression fracture with less than 25% height loss and mild edema at the inferior endplate. Cord:  Normal signal and morphology. Paraspinal and other soft tissues: Negative Disc levels: No spinal canal stenosis. No disc herniation. MRI LUMBAR SPINE FINDINGS Segmentation:  Normal Alignment:  Grade 1 anterolisthesis at L4-5 Vertebrae: L4-S1 PLIF. There is a mild compression fracture of L1 with less than 25% height loss and mild edema beneath the superior endplate. Conus medullaris and cauda equina: Conus extends to the L2 level. Conus and cauda equina appear normal. Paraspinal and other soft tissues: Posterior paraspinal surgical changes at L4-S1. Disc levels: T12-L1: Normal disc space and facets. No spinal canal or neuroforaminal stenosis. L1-L2: Mild disc bulge without spinal canal or neural foraminal stenosis. Normal facets. L2-L3: Disc space narrowing with mild bulge. Mild left foraminal stenosis. No spinal canal stenosis. L3-L4: Left eccentric disc bulge with narrowing of the left lateral recess. No central spinal canal or neural  foraminal stenosis. L4-L5: Grade 1 anterolisthesis. Status post PLIF. Spinal canal is widely patent. No neural impingement. L5-S1: Postfusion changes widely patent spinal canal. Small postsurgical fluid collection posteriorly, likely seroma. Mild left foraminal narrowing. Visualized sacrum: Normal. IMPRESSION: 1. Compression fractures of T11 and L1 with less than 25% height loss and mild edema. Likely subacute timeline. 2. L4-S1 PLIF with widely patent spinal canal. 3. Remote vertebral augmentation at T7 and T9. Electronically Signed   By: Ulyses Jarred M.D.   On: 06/02/2019 22:35     Subjective: Patient was feeling better when seen this morning.  Oxycodone was helping her while resting.  Continues to experience pain with movements.  She wants to be discharged with some pain medicine.  Discharge Exam: Vitals:   06/03/19 1425 06/03/19 1428  BP: (!) 128/108 (!) 141/82  Pulse: 79   Resp: 16   Temp: 98.4 F (36.9 C)   SpO2: 96%    Vitals:   06/03/19 0517 06/03/19 0744 06/03/19 1425 06/03/19 1428  BP: (!) 158/79 (!) 155/95 (!) 128/108 (!) 141/82  Pulse: 72 68 79   Resp: 17 16 16  Temp: 97.9 F (36.6 C) 98.7 F (37.1 C) 98.4 F (36.9 C)   TempSrc:  Oral Oral   SpO2: 91% 97% 96%   Weight:      Height:        General: Pt is alert, awake, not in acute distress Cardiovascular: RRR, S1/S2 +, no rubs, no gallops Respiratory: CTA bilaterally, no wheezing, no rhonchi Abdominal: Soft, NT, ND, bowel sounds + Extremities: no edema, no cyanosis   The results of significant diagnostics from this hospitalization (including imaging, microbiology, ancillary and laboratory) are listed below for reference.     Microbiology: Recent Results (from the past 240 hour(s))  SARS CORONAVIRUS 2 (TAT 6-24 HRS) Nasopharyngeal Nasopharyngeal Swab     Status: None   Collection Time: 06/02/19 11:52 PM   Specimen: Nasopharyngeal Swab  Result Value Ref Range Status   SARS Coronavirus 2 NEGATIVE NEGATIVE  Final    Comment: (NOTE) SARS-CoV-2 target nucleic acids are NOT DETECTED. The SARS-CoV-2 RNA is generally detectable in upper and lower respiratory specimens during the acute phase of infection. Negative results do not preclude SARS-CoV-2 infection, do not rule out co-infections with other pathogens, and should not be used as the sole basis for treatment or other patient management decisions. Negative results must be combined with clinical observations, patient history, and epidemiological information. The expected result is Negative. Fact Sheet for Patients: SugarRoll.be Fact Sheet for Healthcare Providers: https://www.woods-mathews.com/ This test is not yet approved or cleared by the [place] FDA and  has been authorized for detection and/or diagnosis of SARS-CoV-2 by FDA under an Emergency Use Authorization (EUA). This EUA will remain  in effect (meaning this test can be used) for the duration of the COVID-19 declaration under Section 56 4(b)(1) of the Act, 21 U.S.C. section 360bbb-3(b)(1), unless the authorization is terminated or revoked sooner. Performed at [facility], [place] [address], [place],  [address]      Labs: BNP (last 3 results) No results for input(s): BNP in the last 8760 hours. Basic Metabolic Panel: Recent Labs  Lab 06/02/19 2231 06/03/19 0218  NA 138 135  K 3.6 4.1  CL 105 103  CO2 23 22  GLUCOSE 124* 102*  BUN 25* 27*  CREATININE 1.46* 1.60*  CALCIUM 8.6* 9.0  MG 1.9  --   PHOS 3.9  --    Liver Function Tests: No results for input(s): AST, ALT, ALKPHOS, BILITOT, PROT, ALBUMIN in the last 168 hours. No results for input(s): LIPASE, AMYLASE in the last 168 hours. No results for input(s): AMMONIA in the last 168 hours. CBC: Recent Labs  Lab 06/02/19 2231 06/03/19 0218  WBC 18.5* 16.5*  NEUTROABS 15.3*  --   HGB 12.6 13.7  HCT 39.7 45.1  MCV 97.8 98.9  PLT 417* 421*   Cardiac  Enzymes: No results for input(s): CKTOTAL, CKMB, CKMBINDEX, TROPONINI in the last 168 hours. BNP: Invalid input(s): POCBNP CBG: No results for input(s): GLUCAP in the last 168 hours. D-Dimer No results for input(s): DDIMER in the last 72 hours. Hgb A1c No results for input(s): HGBA1C in the last 72 hours. Lipid Profile No results for input(s): CHOL, HDL, LDLCALC, TRIG, CHOLHDL, LDLDIRECT in the last 72 hours. Thyroid function studies No results for input(s): TSH, T4TOTAL, T3FREE, THYROIDAB in the last 72 hours.  Invalid input(s): FREET3 Anemia work up No results for input(s): VITAMINB12, FOLATE, FERRITIN, TIBC, IRON, RETICCTPCT in the last 72 hours. Urinalysis    Component Value Date/Time   COLORURINE YELLOW  01/27/2019 Welcome 01/27/2019 1703   LABSPEC 1.012 01/27/2019 1703   PHURINE 6.0 01/27/2019 1703   GLUCOSEU NEGATIVE 01/27/2019 1703   HGBUR LARGE (A) 01/27/2019 1703   BILIRUBINUR NEGATIVE 01/27/2019 1703   KETONESUR NEGATIVE 01/27/2019 1703   PROTEINUR NEGATIVE 01/27/2019 1703   UROBILINOGEN 0.2 12/13/2014 1506   NITRITE NEGATIVE 01/27/2019 1703   LEUKOCYTESUR NEGATIVE 01/27/2019 1703   Sepsis Labs Invalid input(s): PROCALCITONIN,  WBC,  LACTICIDVEN Microbiology Recent Results (from the past 240 hour(s))  SARS CORONAVIRUS 2 (TAT 6-24 HRS) Nasopharyngeal Nasopharyngeal Swab     Status: None   Collection Time: 06/02/19 11:52 PM   Specimen: Nasopharyngeal Swab  Result Value Ref Range Status   SARS Coronavirus 2 NEGATIVE NEGATIVE Final    Comment: (NOTE) SARS-CoV-2 target nucleic acids are NOT DETECTED. The SARS-CoV-2 RNA is generally detectable in upper and lower respiratory specimens during the acute phase of infection. Negative results do not preclude SARS-CoV-2 infection, do not rule out co-infections with other pathogens, and should not be used as the sole basis for treatment or other patient management decisions. Negative results must be  combined with clinical observations, patient history, and epidemiological information. The expected result is Negative. Fact Sheet for Patients: SugarRoll.be Fact Sheet for Healthcare Providers: https://www.woods-mathews.com/ This test is not yet approved or cleared by the [place] FDA and  has been authorized for detection and/or diagnosis of SARS-CoV-2 by FDA under an Emergency Use Authorization (EUA). This EUA will remain  in effect (meaning this test can be used) for the duration of the COVID-19 declaration under Section 56 4(b)(1) of the Act, 21 U.S.C. section 360bbb-3(b)(1), unless the authorization is terminated or revoked sooner. Performed at [facility], [place] [address], [place], [place] [address]     Time coordinating discharge: Over 30 minutes  SIGNED:  Lorella Nimrod, MD  [facility] 06/03/2019, 2:48 PM Pager [phone]  If 7PM-7AM, please contact night-coverage [email or web address] Password [email or web address]

## 2019-06-03 NOTE — Evaluation (Signed)
Physical Therapy Evaluation Patient Details Name: [name] [name] MRN: [id number] DOB: 11-23-1940 Today's Date: 06/03/2019   History of Present Illness  78 y.o. female with medical history significant of hayfever, osteoarthritis of the shoulder, history of Bell's palsy, history of remote blood transfusion, colon polyps, Crohn's disease, GERD, migraine headaches, hypertension, history of major depression disorder who is coming to the emergency department due to progressively worsening back pain for the past 2 weeks, but particularly very intense over the last 3 days. Dx of T11 and L1 compression fractures.  Clinical Impression  Pt admitted with above diagnosis. Pt ambulated 110' with RW, no loss of balance, distance limited by 2/4 dyspnea, SaO2 96% on room air after 1 minute seated rest, HR 91. Pt reports SOB with activity is baseline for her. She would benefit from HHPT to address activity tolerance and home safety. Pt denies h/o falls in past 1 year.  Pt currently with functional limitations due to the deficits listed below (see PT Problem List). Pt will benefit from skilled PT to increase their independence and safety with mobility to allow discharge to the venue listed below.       Follow Up Recommendations Home health PT    Equipment Recommendations  None recommended by PT    Recommendations for Other Services       Precautions / Restrictions Precautions Precautions: Fall Precaution Comments: pt denies h/o falls, noted bruises on her shins, she stated this was from bumping into the kitchen chairs Restrictions Weight Bearing Restrictions: No      Mobility  Bed Mobility Overal bed mobility: Modified Independent             General bed mobility comments: used bedrail, HOB up 25*  Transfers Overall transfer level: Needs assistance Equipment used: Rolling walker (2 wheeled) Transfers: Sit to/from Stand Sit to Stand: Supervision         General transfer comment: good  hand placement, no unsteadiness  Ambulation/Gait Ambulation/Gait assistance: Supervision Gait Distance (Feet): 110 Feet Assistive device: Rolling walker (2 wheeled) Gait Pattern/deviations: Step-through pattern;Decreased stride length Gait velocity: mildly decr   General Gait Details: steady, no loss of balance; distance limited by 2/4 dyspnea, SaO2 96% on room air after 1 minute seated rest right after ambulating, HR 91; pt reported SOB with activity is baseline for her  Stairs            Wheelchair Mobility    Modified Rankin (Stroke Patients Only)       Balance Overall balance assessment: Modified Independent                                           Pertinent Vitals/Pain Pain Assessment: 0-10 Pain Score: 3  Pain Location: low back Pain Descriptors / Indicators: Sore Pain Intervention(s): Limited activity within patient's tolerance;Monitored during session;RN gave pain meds during session    Home Living Family/patient expects to be discharged to:: Private residence Living Arrangements: Children Available Help at Discharge: Family;Available PRN/intermittently Type of Home: House Home Access: Ramped entrance     Home Layout: One level Home Equipment: Cane - single point;Walker - 4 wheels;Walker - 2 wheels;Shower seat;Bedside commode Additional Comments: lives with son and DIL who work, pt is alone 12*/day, pt stated neighbors check on her    Prior Function Level of Independence: Independent with assistive device(s)         Comments:  walks with cane, denies h/o falls     Hand Dominance        Extremity/Trunk Assessment   Upper Extremity Assessment Upper Extremity Assessment: Overall WFL for tasks assessed    Lower Extremity Assessment Lower Extremity Assessment: Overall WFL for tasks assessed    Cervical / Trunk Assessment Cervical / Trunk Assessment: Normal  Communication   Communication: No difficulties  Cognition  Arousal/Alertness: Awake/alert Behavior During Therapy: WFL for tasks assessed/performed Overall Cognitive Status: No family/caregiver present to determine baseline cognitive functioning                                 General Comments: WFL during eval, noted h/o dementia in chart, able to follow commands consistently      General Comments      Exercises     Assessment/Plan    PT Assessment Patient needs continued PT services  PT Problem List Decreased activity tolerance;Decreased mobility;Cardiopulmonary status limiting activity       PT Treatment Interventions DME instruction;Gait training;Functional mobility training;Therapeutic exercise;Therapeutic activities;Patient/family education    PT Goals (Current goals can be found in the Care Plan section)  Acute Rehab PT Goals Patient Stated Goal: return home to her 3 dogs PT Goal Formulation: With patient Time For Goal Achievement: 06/17/19 Potential to Achieve Goals: Good    Frequency Min 3X/week   Barriers to discharge        Co-evaluation               AM-PAC PT "6 Clicks" Mobility  Outcome Measure Help needed turning from your back to your side while in a flat bed without using bedrails?: A Little Help needed moving from lying on your back to sitting on the side of a flat bed without using bedrails?: A Little Help needed moving to and from a bed to a chair (including a wheelchair)?: A Little Help needed standing up from a chair using your arms (e.g., wheelchair or bedside chair)?: None Help needed to walk in hospital room?: None Help needed climbing 3-5 steps with a railing? : A Little 6 Click Score: 20    End of Session Equipment Utilized During Treatment: Gait belt Activity Tolerance: Patient tolerated treatment well Patient left: in chair;with call bell/phone within reach;with nursing/sitter in room Nurse Communication: Mobility status PT Visit Diagnosis: Difficulty in walking, not  elsewhere classified (R26.2);Pain    Time: 5110-2111 PT Time Calculation (min) (ACUTE ONLY): 21 min   Charges:   PT Evaluation $PT Eval Low Complexity: 1 Low          Blondell Reveal Kistler PT 06/03/2019  Acute Rehabilitation Services Pager [phone] Office [phone]

## 2019-06-03 NOTE — Plan of Care (Signed)
Patient discharged home in stable condition, she is waiting on her ride

## 2019-06-04 ENCOUNTER — Telehealth: Payer: Self-pay | Admitting: *Deleted

## 2019-06-04 ENCOUNTER — Encounter: Payer: Self-pay | Admitting: Internal Medicine

## 2019-06-04 DIAGNOSIS — D500 Iron deficiency anemia secondary to blood loss (chronic): Secondary | ICD-10-CM | POA: Diagnosis not present

## 2019-06-04 DIAGNOSIS — K635 Polyp of colon: Secondary | ICD-10-CM | POA: Diagnosis not present

## 2019-06-04 DIAGNOSIS — E46 Unspecified protein-calorie malnutrition: Secondary | ICD-10-CM | POA: Diagnosis not present

## 2019-06-04 DIAGNOSIS — M4316 Spondylolisthesis, lumbar region: Secondary | ICD-10-CM | POA: Diagnosis not present

## 2019-06-04 DIAGNOSIS — G510 Bell's palsy: Secondary | ICD-10-CM | POA: Diagnosis not present

## 2019-06-04 DIAGNOSIS — I872 Venous insufficiency (chronic) (peripheral): Secondary | ICD-10-CM | POA: Diagnosis not present

## 2019-06-04 DIAGNOSIS — M48061 Spinal stenosis, lumbar region without neurogenic claudication: Secondary | ICD-10-CM | POA: Diagnosis not present

## 2019-06-04 DIAGNOSIS — N189 Chronic kidney disease, unspecified: Secondary | ICD-10-CM | POA: Diagnosis not present

## 2019-06-04 DIAGNOSIS — M19019 Primary osteoarthritis, unspecified shoulder: Secondary | ICD-10-CM | POA: Diagnosis not present

## 2019-06-04 DIAGNOSIS — G43909 Migraine, unspecified, not intractable, without status migrainosus: Secondary | ICD-10-CM | POA: Diagnosis not present

## 2019-06-04 DIAGNOSIS — D473 Essential (hemorrhagic) thrombocythemia: Secondary | ICD-10-CM | POA: Diagnosis not present

## 2019-06-04 DIAGNOSIS — D849 Immunodeficiency, unspecified: Secondary | ICD-10-CM | POA: Diagnosis not present

## 2019-06-04 DIAGNOSIS — F322 Major depressive disorder, single episode, severe without psychotic features: Secondary | ICD-10-CM | POA: Diagnosis not present

## 2019-06-04 DIAGNOSIS — M4855XD Collapsed vertebra, not elsewhere classified, thoracolumbar region, subsequent encounter for fracture with routine healing: Secondary | ICD-10-CM | POA: Diagnosis not present

## 2019-06-04 DIAGNOSIS — I129 Hypertensive chronic kidney disease with stage 1 through stage 4 chronic kidney disease, or unspecified chronic kidney disease: Secondary | ICD-10-CM | POA: Diagnosis not present

## 2019-06-04 DIAGNOSIS — M8580 Other specified disorders of bone density and structure, unspecified site: Secondary | ICD-10-CM | POA: Diagnosis not present

## 2019-06-04 DIAGNOSIS — M5116 Intervertebral disc disorders with radiculopathy, lumbar region: Secondary | ICD-10-CM | POA: Diagnosis not present

## 2019-06-04 DIAGNOSIS — K219 Gastro-esophageal reflux disease without esophagitis: Secondary | ICD-10-CM | POA: Diagnosis not present

## 2019-06-04 DIAGNOSIS — F0390 Unspecified dementia without behavioral disturbance: Secondary | ICD-10-CM | POA: Diagnosis not present

## 2019-06-04 DIAGNOSIS — M1612 Unilateral primary osteoarthritis, left hip: Secondary | ICD-10-CM | POA: Diagnosis not present

## 2019-06-04 DIAGNOSIS — D631 Anemia in chronic kidney disease: Secondary | ICD-10-CM | POA: Diagnosis not present

## 2019-06-04 DIAGNOSIS — K5090 Crohn's disease, unspecified, without complications: Secondary | ICD-10-CM | POA: Diagnosis not present

## 2019-06-04 DIAGNOSIS — M2578 Osteophyte, vertebrae: Secondary | ICD-10-CM | POA: Diagnosis not present

## 2019-06-04 DIAGNOSIS — D630 Anemia in neoplastic disease: Secondary | ICD-10-CM | POA: Diagnosis not present

## 2019-06-04 DIAGNOSIS — M4726 Other spondylosis with radiculopathy, lumbar region: Secondary | ICD-10-CM | POA: Diagnosis not present

## 2019-06-04 NOTE — Telephone Encounter (Signed)
Spoke to son [name].  Patient has been discharged from the hospital.  I had recommended the other day to stop the patient's donepezil which was appropriately started to try to help her memory but was associated with an increase in diarrhea and incontinence.  The hospitalist did not realize that and sent the patient back out on that medication and I think actually the patient was off of her mirtazapine by mistake through communication issue from the daughter-in-law to son.  At any rate I clarified today I would leave her on mirtazapine and stop the donepezil because of the diarrhea and this can be reassessed at primary care follow-up and neurology evaluation.

## 2019-06-04 NOTE — Telephone Encounter (Signed)
Transition Care Management Follow-up Telephone Call   Date discharged? 06/03/19   How have you been since you were released from the hospital? Spoke w/son [name] [name]) he states mom is doing alright   Do you understand why you were in the hospital? YES   Do you understand the discharge instructions? YES   Where were you discharged to? Home   Items Reviewed:  Medications reviewed: YES, son states mom is taking pain med oxycodone for her back.   Allergies reviewed: YES  Dietary changes reviewed: YES  Referrals reviewed: YES, have f.u appt w/Dr. Carlean Purl on 06/23/19   Functional Questionnaire:   Activities of Daily Living (ADLs):   He states she are independent in the following: feeding, continence, grooming and toileting States they require assistance with the following: ambulation and bathing and hygiene son states mom can't get in the bath tab due to her back pain, but she is able to give her self sponge baths.    Any transportation issues/concerns?: NO   Any patient concerns? NO   Confirmed importance and date/time of follow-up visits scheduled YES, appt 06/15/19  Provider Appointment booked with Dr. Sharlet Salina  Confirmed with patient if condition begins to worsen call PCP or go to the ER.  Patient was given the office number and encouraged to call back with question or concerns.  : YES

## 2019-06-09 ENCOUNTER — Ambulatory Visit: Payer: Medicare Other | Admitting: Internal Medicine

## 2019-06-15 ENCOUNTER — Other Ambulatory Visit: Payer: Self-pay

## 2019-06-15 ENCOUNTER — Telehealth: Payer: Self-pay

## 2019-06-15 ENCOUNTER — Encounter: Payer: Self-pay | Admitting: Internal Medicine

## 2019-06-15 ENCOUNTER — Ambulatory Visit (INDEPENDENT_AMBULATORY_CARE_PROVIDER_SITE_OTHER): Payer: Medicare Other | Admitting: Internal Medicine

## 2019-06-15 VITALS — BP 122/90 | HR 91 | Temp 97.8°F | Ht 65.0 in | Wt 157.0 lb

## 2019-06-15 DIAGNOSIS — G309 Alzheimer's disease, unspecified: Secondary | ICD-10-CM | POA: Diagnosis not present

## 2019-06-15 DIAGNOSIS — S22080A Wedge compression fracture of T11-T12 vertebra, initial encounter for closed fracture: Secondary | ICD-10-CM | POA: Diagnosis not present

## 2019-06-15 DIAGNOSIS — F0280 Dementia in other diseases classified elsewhere without behavioral disturbance: Secondary | ICD-10-CM

## 2019-06-15 MED ORDER — CALCITONIN (SALMON) 200 UNIT/ACT NA SOLN: 1.0000 | Freq: Every day | NASAL | 2 refills | Status: DC

## 2019-06-15 NOTE — Progress Notes (Signed)
   Subjective:   Patient ID: [name] [name], female    DOB: [birth year], 78 y.o.   MRN: [id number]  HPI The patient is a 78 YO female coming in for hospital follow up (in for back pain and 2 compression fractures in the spine, given pain medication and sent home with PT). She is at home currently and lives alone with her son helping. She does have memory problems and her sister is with her today to help provide history. There is some confusion about pill taking at home. She states she is only taking tylenol but sister clarifies that she is taking the oxycodone twice a day currently for pain. She denies other concerns. Denies constipation or diarrhea currently. She does not have life alert which we recommended at last visit several months ago. Her sister mentions that she falls often and sometimes they just find her at home on the floor as she cannot get up by herself and she cannot always remember if she has fallen.   PMH, [facility], social history reviewed and updated  Review of Systems  Unable to perform ROS: Dementia  Constitutional: Negative.   HENT: Negative.   Eyes: Negative.   Respiratory: Negative for cough, chest tightness and shortness of breath.   Cardiovascular: Negative for chest pain, palpitations and leg swelling.  Gastrointestinal: Negative for abdominal distention, abdominal pain, constipation, diarrhea, nausea and vomiting.  Musculoskeletal: Positive for back pain.  Skin: Negative.   Neurological: Negative.   Psychiatric/Behavioral: Negative.     Objective:  Physical Exam Constitutional:      Appearance: She is well-developed.  HENT:     Head: Normocephalic and atraumatic.  Neck:     Musculoskeletal: Normal range of motion.  Cardiovascular:     Rate and Rhythm: Normal rate and regular rhythm.  Pulmonary:     Effort: Pulmonary effort is normal. No respiratory distress.     Breath sounds: Normal breath sounds. No wheezing or rales.  Abdominal:     General: Bowel sounds  are normal. There is no distension.     Palpations: Abdomen is soft.     Tenderness: There is no abdominal tenderness. There is no rebound.  Musculoskeletal:        General: Tenderness present.     Comments: Pain around T12  Skin:    General: Skin is warm and dry.  Neurological:     Mental Status: She is alert. Mental status is at baseline.     Coordination: Coordination normal.     Vitals:   06/15/19 1037  BP: 122/90  Pulse: 91  Temp: 97.8 F (36.6 C)  TempSrc: Oral  SpO2: 99%  Weight: 157 lb (71.2 kg)  Height: 5' 5"  (1.651 m)    This visit occurred during the SARS-CoV-2 public health emergency.  Safety protocols were in place, including screening questions prior to the visit, additional usage of staff PPE, and extensive cleaning of exam room while observing appropriate contact time as indicated for disinfecting solutions.   Assessment & Plan:

## 2019-06-15 NOTE — Assessment & Plan Note (Signed)
Will start prolia and rx calcitonin nose spray to start immediately. They decline need for refill opioids today. If need refill would do tramadol due to fall and memory changes.

## 2019-06-15 NOTE — Telephone Encounter (Signed)
Patient's insurance will be verified, ok to call either son with summary of benefits per dr crawford--patient's sister was advised that we were looking into prolia today at office visit---we will call son back and explain summary of benefits once received from insurance company

## 2019-06-15 NOTE — Assessment & Plan Note (Signed)
It is unclear if she is safe staying home alone. She is asked again to get life alert and explained to sister reasoning. Sister agrees but states son would have to arrange. She is falling without being able to get up. Does have cellphone which she does not know how to use well and does not keep with her. She does have significant memory impairment.

## 2019-06-15 NOTE — Telephone Encounter (Signed)
-----   Message from Hoyt Koch, MD sent at 06/15/2019 12:29 PM EST ----- Can we get prolia started for her for thoracic compression fracture? Thanks, Delma Officer

## 2019-06-15 NOTE — Patient Instructions (Addendum)
We have sent in a nose spray to use 1 spray each day. Alternate nostril each day (left nostril Monday, right nostril Tuesday, left nostril Wednesday, right nostril Thursday). Use this for 3 months.   We do think that you need life alert or similar to help for when you fall to get help.   We will arrange to start prolia shots which are twice a year to help the bones.

## 2019-06-23 ENCOUNTER — Encounter: Payer: Self-pay | Admitting: Internal Medicine

## 2019-06-23 ENCOUNTER — Other Ambulatory Visit (INDEPENDENT_AMBULATORY_CARE_PROVIDER_SITE_OTHER): Payer: Medicare Other

## 2019-06-23 ENCOUNTER — Ambulatory Visit (INDEPENDENT_AMBULATORY_CARE_PROVIDER_SITE_OTHER): Payer: Medicare Other | Admitting: Internal Medicine

## 2019-06-23 VITALS — BP 122/78 | HR 98 | Temp 98.4°F | Ht 65.0 in | Wt 159.0 lb

## 2019-06-23 DIAGNOSIS — D500 Iron deficiency anemia secondary to blood loss (chronic): Secondary | ICD-10-CM | POA: Diagnosis not present

## 2019-06-23 DIAGNOSIS — Z7952 Long term (current) use of systemic steroids: Secondary | ICD-10-CM

## 2019-06-23 DIAGNOSIS — K743 Primary biliary cirrhosis: Secondary | ICD-10-CM

## 2019-06-23 DIAGNOSIS — K50111 Crohn's disease of large intestine with rectal bleeding: Secondary | ICD-10-CM

## 2019-06-23 DIAGNOSIS — N182 Chronic kidney disease, stage 2 (mild): Secondary | ICD-10-CM

## 2019-06-23 LAB — BASIC METABOLIC PANEL
BUN: 28 mg/dL — ABNORMAL HIGH (ref 6–23)
CO2: 21 mEq/L (ref 19–32)
Calcium: 9.6 mg/dL (ref 8.4–10.5)
Chloride: 98 mEq/L (ref 96–112)
Creatinine, Ser: 1.49 mg/dL — ABNORMAL HIGH (ref 0.40–1.20)
GFR: 33.87 mL/min — ABNORMAL LOW (ref >60.00)
Glucose, Bld: 160 mg/dL — ABNORMAL HIGH (ref 70–99)
Potassium: 3.9 mEq/L (ref 3.5–5.1)
Sodium: 133 mEq/L — ABNORMAL LOW (ref 135–145)

## 2019-06-23 MED ORDER — PREDNISONE 10 MG PO TABS: 20.0000 mg | ORAL_TABLET | Freq: Every day | ORAL | 2 refills | Status: DC

## 2019-06-23 NOTE — Patient Instructions (Signed)
Your provider has requested that you go to the basement level for lab work before leaving today. Press "B" on the elevator. The lab is located at the first door on the left as you exit the elevator.   We have sent the following medications to your pharmacy for you to pick up at your convenience: Prednisone   Please follow up with Dr Carlean Purl in 3 months.   I appreciate the opportunity to care for you. [name], MD, [facility]

## 2019-06-23 NOTE — Assessment & Plan Note (Signed)
Recheck renal function today

## 2019-06-23 NOTE — Assessment & Plan Note (Signed)
Hgb NL

## 2019-06-23 NOTE — Assessment & Plan Note (Signed)
Continue ursodeoxycholic acid.  Alkaline phosphatase is normal.

## 2019-06-23 NOTE — Assessment & Plan Note (Signed)
Stable Steroid dependent Stay at 20 mg prednisone Other options pretty much exhausted

## 2019-06-23 NOTE — Progress Notes (Signed)
[name] [name] 78 y.o. 15-Dec-1940 [id number]  Assessment & Plan:  Crohn's disease of colon with rectal bleeding (HCC) Stable Steroid dependent Stay at 20 mg prednisone Other options pretty much exhausted  Iron deficiency anemia due to chronic blood loss Hgb NL  Long term (current) use of systemic steroids-prednisone Unfortunately no other good Tx options for crohn's  Primary biliary cholangitis (HCC) Continue ursodeoxycholic acid.  Alkaline phosphatase is normal.  CKD (chronic kidney disease) Recheck renal function today    Return to clinic in 3 months routine sooner as needed  Subjective:   Chief Complaint: Follow-up of Crohn's colitis she also has primary biliary cholangitis  HPI [name] [name] is here with her sister, no specific complaints related to her GI issues other than needing a refill on prednisone.  She was hospitalized in November with 2 new compression fractures 1 thoracic and 1 lumbar and had a rise in her creatinine at that time.  Colitis is under control makes it to the bathroom in time no bleeding or diarrhea or significant abdominal pain reported.    Wt Readings from Last 3 Encounters:  06/23/19 159 lb (72.1 kg)  06/15/19 157 lb (71.2 kg)  06/03/19 154 lb 15.7 oz (70.3 kg)   Back pain is better since hospitalization though not gone.  Denies falling recently.  Using a walker to ambulate.  Son [name] is fixing food, she is not using the stove because she will forget about it.  Her sister sees her intermittently but generally transports her to doctors visits etc.  Appetite is good, "too good".  Sleeping at night.  This is all better since mirtazapine instituted.  Allergies  Allergen Reactions  . Azathioprine Nausea Only    Elevated lipase also - ? Pancreatitis   . Entyvio [Vedolizumab] Shortness Of Breath  . Adhesive [Tape] Rash    Taking skin off  . Sulfa Antibiotics Itching and Rash   Current Meds  Medication Sig  . acetaminophen (TYLENOL) 325 MG  tablet Take 650 mg by mouth every 6 (six) hours as needed for moderate pain.  Marland Kitchen amLODipine (NORVASC) 10 MG tablet Take 1 tablet (10 mg total) by mouth daily.  . B Complex-Folic Acid (B COMPLEX FORMULA 1) TABS Take 1 tablet by mouth daily.  . calcitonin, salmon, (MIACALCIN) 200 UNIT/ACT nasal spray Place 1 spray into alternate nostrils daily.  Marland Kitchen FLUoxetine (PROZAC) 20 MG capsule Take 1 capsule (20 mg total) by mouth daily.  . hydrochlorothiazide (HYDRODIURIL) 25 MG tablet Take 1 tablet (25 mg total) by mouth daily.  . methocarbamol (ROBAXIN) 750 MG tablet Take 1 tablet (750 mg total) by mouth every 6 (six) hours as needed for muscle spasms.  . mirtazapine (REMERON) 15 MG tablet Take 1 tablet (15 mg total) by mouth at bedtime.  . Multiple Vitamin (MULTIVITAMIN WITH MINERALS) TABS tablet Take 1 tablet by mouth daily.  Marland Kitchen oxyCODONE (OXY IR/ROXICODONE) 5 MG immediate release tablet Take 1 tablet (5 mg total) by mouth every 4 (four) hours as needed for moderate pain.  . pantoprazole (PROTONIX) 40 MG tablet Take 1 tablet (40 mg total) by mouth daily.  . pravastatin (PRAVACHOL) 20 MG tablet Take 1 tablet (20 mg total) by mouth daily.  . predniSONE (DELTASONE) 10 MG tablet Take 2 tablets (20 mg total) by mouth daily with breakfast.  . triamcinolone cream (KENALOG) 0.1 % Apply 1 application topically 2 (two) times daily.  . ursodiol (ACTIGALL) 300 MG capsule Take 1 capsule (300 mg total) by mouth 2 (  two) times daily.   Past Medical History:  Diagnosis Date  . Allergy    year around allergies   . Arthritis    shoulder  . Bell's palsy   . Blood transfusion    1966  . Blood transfusion without reported diagnosis   . Cataract    bilaterally removed  . Colon polyps    adenomatous polyp June 2012  . Complication of anesthesia    slow to wake  . Crohn's colitis ([place])   . Dry eyes    left eye worst one  . GERD (gastroesophageal reflux disease)   . Headache(784.0)    hx migraines  . Hypertension    . Lumbar compression fracture, closed, initial encounter ([place]) 06/02/2019  . Protein-calorie malnutrition ([place]) 03/06/2019  . Severe major depression ([place]) 03/03/2019  . Shortness of breath    Past Surgical History:  Procedure Laterality Date  . ABDOMINAL HYSTERECTOMY    . APPENDECTOMY    . CATARACT EXTRACTION Bilateral    Separate dates  . CHOLECYSTECTOMY    . COLONOSCOPY  multiple  . flex sigmoidoscopy with biospy  01/17/12  . FLEXIBLE SIGMOIDOSCOPY  06/08/2011   severe Crohn's colitis to descending colon at least  . FOOT GANGLION EXCISION     left  . SHOULDER SURGERY     right  . TONSILLECTOMY     Social History   Social History Narrative   The patient is retired and widowed.  She has 3 children and grandchildren.   Second home in [place] she does not go there much anymore.   She is a former smoker but does not use alcohol or tobacco or drugs.   4 dogs   family history includes Breast cancer in her paternal aunt; Heart attack in her father; Heart disease in her brother, father, and mother; Leukemia in her maternal grandfather and mother; Ovarian cancer in her paternal aunt; Parkinsonism in her brother; Throat cancer in her paternal aunt.   Review of Systems as above As above otherwise negative back pain is improving  Objective:   Physical Exam BP 122/78   Pulse 98   Temp 98.4 F (36.9 C)   Ht 5' 5"  (1.651 m)   Wt 159 lb (72.1 kg)   BMI 26.46 kg/m  Cushingoid elderly white woman no acute distress Eyes anicteric Chest is kyphotic lungs are clear Heart sounds normal Abdomen is soft and nontender without organomegaly or mass There is trace bilateral ankle edema She is awake and alert and oriented to year person and place today.

## 2019-06-23 NOTE — Assessment & Plan Note (Signed)
Unfortunately no other good Tx options for crohn's

## 2019-06-24 NOTE — Progress Notes (Signed)
Kidney function is a bit better and near baseline.  Nothing further at this time.  F/U in 3 mos is the plan  [name], MD, [facility]

## 2019-07-01 ENCOUNTER — Other Ambulatory Visit: Payer: Self-pay | Admitting: *Deleted

## 2019-07-01 ENCOUNTER — Ambulatory Visit: Payer: Self-pay | Admitting: *Deleted

## 2019-07-01 NOTE — Patient Outreach (Signed)
[facility]) Care Management  07/01/2019  Remona Boom Jon 23-Nov-1940 [id number]   CSW attempted to reach pt by phone today and was unable. CSW was able to leave a HIPPA compliant voice message and will outreach again in 3-4 business days if no return call is received.  Eduard Clos, MSW, [place] Worker  [place] [phone]

## 2019-07-07 ENCOUNTER — Ambulatory Visit: Payer: Self-pay | Admitting: *Deleted

## 2019-07-15 ENCOUNTER — Ambulatory Visit: Payer: Self-pay | Admitting: *Deleted

## 2019-07-21 ENCOUNTER — Ambulatory Visit (INDEPENDENT_AMBULATORY_CARE_PROVIDER_SITE_OTHER): Payer: Medicare Other | Admitting: Neurology

## 2019-07-21 ENCOUNTER — Encounter: Payer: Self-pay | Admitting: Neurology

## 2019-07-21 ENCOUNTER — Other Ambulatory Visit: Payer: Self-pay

## 2019-07-21 ENCOUNTER — Ambulatory Visit: Payer: Self-pay | Admitting: *Deleted

## 2019-07-21 VITALS — BP 127/81 | HR 102 | Ht 66.0 in | Wt 155.0 lb

## 2019-07-21 DIAGNOSIS — F0391 Unspecified dementia with behavioral disturbance: Secondary | ICD-10-CM | POA: Diagnosis not present

## 2019-07-21 DIAGNOSIS — F03B18 Unspecified dementia, moderate, with other behavioral disturbance: Secondary | ICD-10-CM

## 2019-07-21 MED ORDER — MEMANTINE HCL 10 MG PO TABS: ORAL_TABLET | ORAL | 11 refills | Status: DC

## 2019-07-21 NOTE — Patient Instructions (Addendum)
1. Start Memantine 67m: Take 1 tablet every night for 2 weeks, then increase to 1 tablet twice a day  2. Recommend getting more help at home, start slow a few times a week to get used to having someone else at home to help.  Contact [facility] [place]  [address]   p [phone] f [phone]  [email or web address]   24-Hour Helpline [phone]  3. Follow-up in 6 months, call for any changes.  FALL PRECAUTIONS: Be cautious when walking. Scan the area for obstacles that may increase the risk of trips and falls. When getting up in the mornings, sit up at the edge of the bed for a few minutes before getting out of bed. Consider elevating the bed at the head end to avoid drop of blood pressure when getting up. Walk always in a well-lit room (use night lights in the walls). Avoid area rugs or power cords from appliances in the middle of the walkways. Use a walker or a cane if necessary and consider physical therapy for balance exercise. Get your eyesight checked regularly.  HOME SAFETY: Consider the safety of the kitchen when operating appliances like stoves, microwave oven, and blender. Consider having supervision and share cooking responsibilities until no longer able to participate in those. Accidents with firearms and other hazards in the house should be identified and addressed as well.  ABILITY TO BE LEFT ALONE: If patient is unable to contact 911 operator, consider using LifeLine, or when the need is there, arrange for someone to stay with patients. Smoking is a fire hazard, consider supervision or cessation. Risk of wandering should be assessed by caregiver and if detected at any point, supervision and safe proof recommendations should be instituted.   RECOMMENDATIONS FOR ALL PATIENTS WITH MEMORY PROBLEMS: 1. Continue to exercise (Recommend 30 minutes of walking everyday, or 3 hours every week) 2.  Increase social interactions - [place] gatherings with friends and family 3. Eat healthy, avoid fried foods and eat more fruits and vegetables 4. Maintain adequate blood pressure, blood sugar, and blood cholesterol level. Reducing the risk of stroke and cardiovascular disease also helps promoting better memory. 5. Avoid stressful situations. Live a simple life and avoid aggravations. Organize your time and prepare for the next day in anticipation. 6. Sleep well, avoid any interruptions of sleep and avoid any distractions in the bedroom that may interfere with adequate sleep quality 7. Avoid sugar, avoid sweets as there is a strong link between excessive sugar intake, diabetes, and cognitive impairment The Mediterranean diet has been shown to help patients reduce the risk of progressive memory disorders and reduces cardiovascular risk. This includes eating fish, eat fruits and green leafy vegetables, nuts like almonds and hazelnuts, walnuts, and also use olive oil. Avoid fast foods and fried foods as much as possible. Avoid sweets and sugar as sugar use has been linked to worsening of memory function.  There is always a concern of gradual progression of memory problems. If this is the case, then we may need to adjust level of care according to patient needs. Support, both to the patient and caregiver, should then be put into place.

## 2019-07-21 NOTE — Progress Notes (Signed)
NEUROLOGY CONSULTATION NOTE  [name] [name] MRN: [id number] DOB: [birth year]  Referring provider: Dr. [name] Primary care provider: Dr. Pricilla Holm  Reason for consult:  Memory loss  Dear Dr Carlean Purl:  Thank you for your kind referral of [facility] for consultation of the above symptoms. Although her history is well known to you, please allow me to reiterate it for the purpose of our medical record. The patient was accompanied to the clinic by her son who also provides collateral information. Records and images were personally reviewed where available.   HISTORY OF PRESENT ILLNESS: This is a pleasant 78 year old right-handed woman with a history of hypertension, hyperlipidemia, Crohn's disease, depression, presenting for evaluation of memory loss. Her son [name] [name] is present during the visit to provide additional information. [name] [name] started noticing mild memory changes when he moved in after her husband passed away in 20-Sep-2008. Memory loss has been worse in the past 1-2 years where he noticed she was getting confused with her medications, taking both her AM and PM medications at the same time, even if he set them up in different containers. He started administering medications last year. Around 1.5 years ago, she kept burning food in the stove/oven, they turned it off. She stopped driving early this year, she was not getting lost but she did not trust herself to drive anymore. [name] [name] has been managing finances since Sep 20, 2008. She is independent with dressing and bathing, however [name] [name] reports that she has not taken a full bath/shower since she was discharged from the SNF in July. He has noticed behavioral changes, he cannot get her out of the house. She does not want to get out and move around like she needs to. She states mood is sometimes up and down, [name] [name] reports she has been very irritable, it has been challenging because she does not want to get motivated. There has been some  paranoia, she would blame someone when things go missing. She has blamed their new neighbors for her missing phone. Sleep is "every which way." She states she gets 7-8 hours of refreshing sleep but she does not sleep good. She takes frequent naps.  She was tried on Donepezil which cause diarrhea. She has had some accidents with bowel movements ("cannot move fast"). She has been wearing adult diapers for the past year. She has occasional pains in her lower stomach and low back pain, occasionally asking for oxycodone. She has occasional dizziness. No headaches, diplopia, dysarthria/dysphagia, bladder dysfunction. She has occasional left leg numbness. No anosmia or significant tremors. She had a fall without her walker, she states she is using it more because her legs are not as strong. No family history of dementia. No history of significant head injuries or alcohol use.   Head CT without contrast done 01/2019 which did not show any acute changes. There was moderate diffuse atrophy and chronic microvascular disease.    Laboratory Data: Lab Results  Component Value Date   TSH 0.750 01/29/2019   Lab Results  Component Value Date   [id number] 5,374 (H) 01/29/2019     PAST MEDICAL HISTORY: Past Medical History:  Diagnosis Date  . Allergy    year around allergies   . Arthritis    shoulder  . Bell's palsy   . Blood transfusion    09-20-1964  . Blood transfusion without reported diagnosis   . Cataract    bilaterally removed  . Colon polyps    adenomatous polyp June 2012  .  Complication of anesthesia    slow to wake  . Crohn's colitis ([place])   . Dry eyes    left eye worst one  . GERD (gastroesophageal reflux disease)   . Headache(784.0)    hx migraines  . Hypertension   . Lumbar compression fracture, closed, initial encounter ([place]) 06/02/2019  . Protein-calorie malnutrition ([place]) 03/06/2019  . 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 Shortness of breath     PAST SURGICAL HISTORY: Past  Surgical History:  Procedure Laterality Date  . ABDOMINAL HYSTERECTOMY    . APPENDECTOMY    . CATARACT EXTRACTION Bilateral    Separate dates  . CHOLECYSTECTOMY    . COLONOSCOPY  multiple  . flex sigmoidoscopy with biospy  01/17/12  . FLEXIBLE SIGMOIDOSCOPY  06/08/2011   severe Crohn's colitis to descending colon at least  . FOOT GANGLION EXCISION     left  . SHOULDER SURGERY     right  . TONSILLECTOMY      MEDICATIONS: Current Outpatient Medications on File Prior to Visit  Medication Sig Dispense Refill  . acetaminophen (TYLENOL) 325 MG tablet Take 650 mg by mouth every 6 (six) hours as needed for moderate pain.    Marland Kitchen amLODipine (NORVASC) 10 MG tablet Take 1 tablet (10 mg total) by mouth daily. 90 tablet 3  . B Complex-Folic Acid (B COMPLEX FORMULA 1) TABS Take 1 tablet by mouth daily.    . calcitonin, salmon, (MIACALCIN) 200 UNIT/ACT nasal spray Place 1 spray into alternate nostrils daily. 3.7 mL 2  . FLUoxetine (PROZAC) 20 MG capsule Take 1 capsule (20 mg total) by mouth daily. 90 capsule 3  . hydrochlorothiazide (HYDRODIURIL) 25 MG tablet Take 1 tablet (25 mg total) by mouth daily. 90 tablet 3  . mirtazapine (REMERON) 15 MG tablet Take 1 tablet (15 mg total) by mouth at bedtime. 90 tablet 1  . oxyCODONE (OXY IR/ROXICODONE) 5 MG immediate release tablet Take 1 tablet (5 mg total) by mouth every 4 (four) hours as needed for moderate pain. 30 tablet 0  . pravastatin (PRAVACHOL) 20 MG tablet Take 1 tablet (20 mg total) by mouth daily. 90 tablet 3  . predniSONE (DELTASONE) 10 MG tablet Take 2 tablets (20 mg total) by mouth daily with breakfast. 60 tablet 2  . sodium chloride (OCEAN) 0.65 % SOLN nasal spray Place 1 spray into both nostrils as needed for congestion.    . ursodiol (ACTIGALL) 300 MG capsule Take 1 capsule (300 mg total) by mouth 2 (two) times daily. 180 capsule 3   No current facility-administered medications on file prior to visit.    ALLERGIES: Allergies  Allergen  Reactions  . Azathioprine Nausea Only    Elevated lipase also - ? Pancreatitis   . Entyvio [Vedolizumab] Shortness Of Breath  . Adhesive [Tape] Rash    Taking skin off  . Sulfa Antibiotics Itching and Rash    FAMILY HISTORY: Family History  Problem Relation Age of Onset  . Leukemia Mother   . Heart disease Mother   . Heart disease Father   . Heart attack Father   . Ovarian cancer Paternal Aunt        1/2 aunt  . Breast cancer Paternal Aunt        1/2 aunt  . Throat cancer Paternal Aunt        1/2 aunt  . Parkinsonism Brother   . Heart disease Brother   . Leukemia Maternal Grandfather   . Anesthesia problems Neg Hx   .  Hypotension Neg Hx   . Malignant hyperthermia Neg Hx   . Pseudochol deficiency Neg Hx   . Colon cancer Neg Hx   . Diabetes Neg Hx   . Esophageal cancer Neg Hx   . Rectal cancer Neg Hx   . Stomach cancer Neg Hx     SOCIAL HISTORY: Social History   Socioeconomic History  . Marital status: Widowed    Spouse name: Not on file  . Number of children: 3  . Years of education: Not on file  . Highest education level: Not on file  Occupational History  . Occupation: retired     Fish farm manager: RETIRED  Tobacco Use  . Smoking status: Former Smoker    Packs/day: 0.25    Years: 10.00    Pack years: 2.50    Types: Cigarettes    Quit date: 09/26/1968    Years since quitting: 50.8  . Smokeless tobacco: Never Used  Substance and Sexual Activity  . Alcohol use: Not Currently    Alcohol/week: 0.0 standard drinks  . Drug use: No  . Sexual activity: Not Currently  Other Topics Concern  . Not on file  Social History Narrative   The patient is retired and widowed.  She has 3 children and grandchildren.   Second home in [place] she does not go there much anymore.   She is a former smoker but does not use alcohol or tobacco or drugs.   4 dogs   Lives with her son and his wife   Right handed   12th grade education   Social Determinants of Health   Financial  Resource Strain: Low Risk   . Difficulty of Paying Living Expenses: Not hard at all  Food Insecurity: No Food Insecurity  . Worried About Charity fundraiser in the Last Year: Never true  . Ran Out of Food in the Last Year: Never true  Transportation Needs: No Transportation Needs  . Lack of Transportation (Medical): No  . Lack of Transportation (Non-Medical): No  Physical Activity: Inactive  . Days of Exercise per Week: 0 days  . Minutes of Exercise per Session: 0 min  Stress: No Stress Concern Present  . Feeling of Stress : Not at all  Social Connections: Unknown  . Frequency of Communication with Friends and Family: More than three times a week  . Frequency of Social Gatherings with Friends and Family: More than three times a week  . Attends Religious Services: 1 to 4 times per year  . Active Member of Clubs or Organizations: Not on file  . Attends Archivist Meetings: Not on file  . Marital Status: Widowed  Intimate Partner Violence:   . Fear of Current or Ex-Partner: Not on file  . Emotionally Abused: Not on file  . Physically Abused: Not on file  . Sexually Abused: Not on file    REVIEW OF SYSTEMS: Constitutional: No fevers, chills, or sweats, no generalized fatigue, change in appetite Eyes: No visual changes, double vision, eye pain Ear, nose and throat: No hearing loss, ear pain, nasal congestion, sore throat Cardiovascular: No chest pain, palpitations Respiratory:  No shortness of breath at rest or with exertion, wheezes GastrointestinaI: No nausea, vomiting, diarrhea, abdominal pain, fecal incontinence Genitourinary:  No dysuria, urinary retention or frequency Musculoskeletal:  No neck pain, +back pain Integumentary: No rash, pruritus, skin lesions Neurological: as above Psychiatric: No depression, insomnia, anxiety Endocrine: No palpitations, fatigue, diaphoresis, mood swings, change in appetite, change in weight, increased  thirst Hematologic/Lymphatic:   No anemia, purpura, petechiae. Allergic/Immunologic: no itchy/runny eyes, nasal congestion, recent allergic reactions, rashes  PHYSICAL EXAM: Vitals:   07/21/19 1031  BP: 127/81  Pulse: (!) 102  SpO2: 96%   General: No acute distress Head:  Normocephalic/atraumatic Skin/Extremities: No rash, no edema Neurological Exam: Mental status: alert and oriented to person, place, and time, no dysarthria or aphasia, Fund of knowledge is appropriate.  Recent and remote memory are impaired.  Attention and concentration are normal.  SLUMS score 17/30. [place] Exam 07/21/2019  Weekday Correct 1  Current year 1  What state are we in? 1  Amount spent 1  Amount left 0  # of Animals 1  5 objects recall 2  Number series 2  Hour markers 2  Time correct 0  Placed X in triangle correctly 1  Largest Figure 1  Name of female 2  Date back to work 0  Type of work 2  State she lived in 0  Total score 17    Cranial nerves: CN I: not tested CN II: pupils equal, round and reactive to light, visual fields intact CN III, IV, VI:  full range of motion, no nystagmus, no ptosis CN V: facial sensation intact CN VII: upper and lower face symmetric CN VIII: hearing intact to conversation CN IX, X: gag intact, uvula midline CN XI: sternocleidomastoid and trapezius muscles intact CN XII: tongue midline Bulk & Tone: normal, no fasciculations. Motor: 5/5 throughout with no pronator drift. Sensation: intact to light touch, cold, pin, vibration and joint position sense.  No extinction to double simultaneous stimulation.  Romberg test negative Deep Tendon Reflexes: +1 throughout, no ankle clonus Plantar responses: downgoing bilaterally Cerebellar: no incoordination on finger to nose testing Gait: slow and cautious with walker, no ataxia Tremor: none  IMPRESSION: This is a pleasant 78 year old right-handed woman with a history of hypertension, hyperlipidemia, Crohn's disease, depression,  presenting for evaluation of progressive memory loss, now wife difficulty managing complex tasks. Her neurological exam is non-focal, SLUMS score 17/30. We discussed the diagnosis of dementia, likely Alzheimer's disease. She had side effects on Donepezil and is agreeable to starting Memantine. Side effects discussed, start Memantine 19m qhs x 2 weeks, then increase to 160mBID. We discussed the need for more help at home, they are currently on a waiting list for their county. Resources for the local Alzheimer's Association provided as well today. Continue close supervision. She does not drive. Follow-up in 6 months, they know to call for any changes.   Thank you for allowing me to participate in the care of this patient. Please do not hesitate to call for any questions or concerns.   KaEllouise NewerM.D.  CC: Dr. GeCarlean PurlDr. CrSharlet Salina

## 2019-07-23 ENCOUNTER — Other Ambulatory Visit: Payer: Self-pay | Admitting: *Deleted

## 2019-07-23 ENCOUNTER — Encounter: Payer: Self-pay | Admitting: *Deleted

## 2019-07-23 NOTE — Patient Outreach (Signed)
[facility]) Care Management  07/23/2019  [name] [name] 07/11/1941 [id number]   CSW attempted to reach pt and was unsuccessful. CSW will send pt an unsuccessful outreach letter and attempt telephonic outreach again per policy of 3 attempts in 10 business days.  Eduard Clos, MSW, [place] Worker  [place] [phone]

## 2019-07-29 ENCOUNTER — Other Ambulatory Visit: Payer: Self-pay | Admitting: *Deleted

## 2019-07-29 NOTE — Patient Outreach (Signed)
[name] [name]  07/29/2019  [name] [name] 20-Apr-1941 [id number]   CSW spoke with pt's daughter in law who states pt is home alone; won't allow caregivers people to come stay with her. "we went to a Neurologist and she has dementia".   Family is anticipating they will have to deal with placement eventually but at this time she is not eligible for Medicaid.   CSW discussed with daughter in law that hopefully they will see some improvement with the new medication(s) started for dementia.  Per daughter in law, her husband, [name] [name], is the HCPOA and there are 2 other adult children in nearby cities.   CSW discussed possible community services/support that may be of help but limited due to current COVID restrictions.  CSW offered support and advice and will plan a follow up call next week for updates.     Eduard Clos, MSW, [place] Worker  [place] [phone]

## 2019-08-04 ENCOUNTER — Ambulatory Visit: Payer: Self-pay | Admitting: *Deleted

## 2019-08-04 ENCOUNTER — Other Ambulatory Visit: Payer: Self-pay | Admitting: *Deleted

## 2019-08-04 NOTE — Patient Outreach (Signed)
[facility]) Care Management  08/04/2019  Kinslie Hove Mazzaferro February 21, 1941 [id number]   CSW attempted to reach pt/family today and was unsuccessful. CSW left a HIPPA compliant message and will await call back or try again in 3-4 business days.  Eduard Clos, MSW, [place] Worker  [place] [phone]

## 2019-08-07 ENCOUNTER — Ambulatory Visit: Payer: Self-pay | Admitting: *Deleted

## 2019-08-07 ENCOUNTER — Other Ambulatory Visit: Payer: Self-pay | Admitting: *Deleted

## 2019-08-07 NOTE — Patient Outreach (Signed)
[facility]) Care Management  08/07/2019  [name] [name] 13-May-1941 [id number]   CSW attempted phone outreach to pt and family today and left HIPPA compliant voice messages.  CSW will await callback or try again for a 3rd outreach attempt per protocol.   Eduard Clos, MSW, [place] Worker  [place] [phone]

## 2019-08-11 ENCOUNTER — Other Ambulatory Visit: Payer: Self-pay | Admitting: Internal Medicine

## 2019-08-13 ENCOUNTER — Other Ambulatory Visit: Payer: Self-pay | Admitting: Neurology

## 2019-08-18 ENCOUNTER — Ambulatory Visit: Payer: Self-pay | Admitting: *Deleted

## 2019-08-19 ENCOUNTER — Other Ambulatory Visit: Payer: Self-pay | Admitting: *Deleted

## 2019-08-19 NOTE — Patient Outreach (Signed)
[facility]) Care Management  08/19/2019  [name] [name] 1940/12/23 [id number]   CSW attempted a 94rd and final phone outreach and was unsuccessful. CSW will close case referral and advise PCP and [facility] team.  Please re-consult CSW if needs/participation interest change.    Eduard Clos, MSW, [place] Worker  [place] [phone]

## 2019-10-05 ENCOUNTER — Telehealth: Payer: Self-pay | Admitting: Internal Medicine

## 2019-10-05 NOTE — Telephone Encounter (Signed)
Insurance has been submitted and verified for Prolia. Patient is responsible for a $0 copay. Due anytime.  Spoke to patient's daughter in Sports coach. They will call back to schedule.   Okay to schedule... Visit Note: Prolia ($0 copay - okay to give per Gareth Eagle) Visit Type: Nurse Provider: Nurse

## 2019-10-11 ENCOUNTER — Other Ambulatory Visit: Payer: Self-pay | Admitting: Internal Medicine

## 2019-10-17 ENCOUNTER — Other Ambulatory Visit: Payer: Self-pay | Admitting: Internal Medicine

## 2019-10-19 ENCOUNTER — Other Ambulatory Visit: Payer: Self-pay | Admitting: Internal Medicine

## 2019-10-26 ENCOUNTER — Telehealth: Payer: Self-pay | Admitting: Internal Medicine

## 2019-10-26 NOTE — Telephone Encounter (Signed)
How many refills Sir, thank you.

## 2019-10-27 MED ORDER — MIRTAZAPINE 15 MG PO TABS: 15.0000 mg | ORAL_TABLET | Freq: Every day | ORAL | 1 refills | Status: DC

## 2019-10-27 NOTE — Telephone Encounter (Signed)
6 

## 2019-10-27 NOTE — Telephone Encounter (Signed)
Mirtazapine refilled as approved.

## 2019-12-20 ENCOUNTER — Other Ambulatory Visit: Payer: Self-pay | Admitting: Internal Medicine

## 2019-12-26 ENCOUNTER — Emergency Department (HOSPITAL_COMMUNITY): Payer: Medicare Other

## 2019-12-26 ENCOUNTER — Inpatient Hospital Stay (HOSPITAL_COMMUNITY)
Admission: EM | Admit: 2019-12-26 | Discharge: 2019-12-30 | DRG: 871 | Disposition: A | Discharge status: Hospice | Payer: Medicare Other | Attending: Internal Medicine | Admitting: Internal Medicine

## 2019-12-26 ENCOUNTER — Other Ambulatory Visit: Payer: Self-pay

## 2019-12-26 DIAGNOSIS — N179 Acute kidney failure, unspecified: Secondary | ICD-10-CM | POA: Diagnosis present

## 2019-12-26 DIAGNOSIS — F0280 Dementia in other diseases classified elsewhere without behavioral disturbance: Secondary | ICD-10-CM | POA: Diagnosis not present

## 2019-12-26 DIAGNOSIS — R4182 Altered mental status, unspecified: Secondary | ICD-10-CM | POA: Diagnosis not present

## 2019-12-26 DIAGNOSIS — D631 Anemia in chronic kidney disease: Secondary | ICD-10-CM | POA: Diagnosis present

## 2019-12-26 DIAGNOSIS — R0602 Shortness of breath: Secondary | ICD-10-CM | POA: Diagnosis not present

## 2019-12-26 DIAGNOSIS — I129 Hypertensive chronic kidney disease with stage 1 through stage 4 chronic kidney disease, or unspecified chronic kidney disease: Secondary | ICD-10-CM | POA: Diagnosis present

## 2019-12-26 DIAGNOSIS — Z882 Allergy status to sulfonamides status: Secondary | ICD-10-CM

## 2019-12-26 DIAGNOSIS — Z20822 Contact with and (suspected) exposure to covid-19: Secondary | ICD-10-CM | POA: Diagnosis present

## 2019-12-26 DIAGNOSIS — M4855XA Collapsed vertebra, not elsewhere classified, thoracolumbar region, initial encounter for fracture: Secondary | ICD-10-CM | POA: Diagnosis present

## 2019-12-26 DIAGNOSIS — I10 Essential (primary) hypertension: Secondary | ICD-10-CM | POA: Diagnosis present

## 2019-12-26 DIAGNOSIS — K50111 Crohn's disease of large intestine with rectal bleeding: Secondary | ICD-10-CM | POA: Diagnosis present

## 2019-12-26 DIAGNOSIS — R410 Disorientation, unspecified: Secondary | ICD-10-CM | POA: Diagnosis not present

## 2019-12-26 DIAGNOSIS — J189 Pneumonia, unspecified organism: Secondary | ICD-10-CM

## 2019-12-26 DIAGNOSIS — Z91048 Other nonmedicinal substance allergy status: Secondary | ICD-10-CM

## 2019-12-26 DIAGNOSIS — D62 Acute posthemorrhagic anemia: Secondary | ICD-10-CM | POA: Diagnosis present

## 2019-12-26 DIAGNOSIS — J9601 Acute respiratory failure with hypoxia: Secondary | ICD-10-CM | POA: Diagnosis not present

## 2019-12-26 DIAGNOSIS — E876 Hypokalemia: Secondary | ICD-10-CM

## 2019-12-26 DIAGNOSIS — F322 Major depressive disorder, single episode, severe without psychotic features: Secondary | ICD-10-CM | POA: Diagnosis present

## 2019-12-26 DIAGNOSIS — R54 Age-related physical debility: Secondary | ICD-10-CM | POA: Diagnosis present

## 2019-12-26 DIAGNOSIS — R739 Hyperglycemia, unspecified: Secondary | ICD-10-CM | POA: Diagnosis present

## 2019-12-26 DIAGNOSIS — J811 Chronic pulmonary edema: Secondary | ICD-10-CM | POA: Diagnosis not present

## 2019-12-26 DIAGNOSIS — K5732 Diverticulitis of large intestine without perforation or abscess without bleeding: Secondary | ICD-10-CM | POA: Diagnosis present

## 2019-12-26 DIAGNOSIS — D72829 Elevated white blood cell count, unspecified: Secondary | ICD-10-CM | POA: Diagnosis not present

## 2019-12-26 DIAGNOSIS — Z8249 Family history of ischemic heart disease and other diseases of the circulatory system: Secondary | ICD-10-CM

## 2019-12-26 DIAGNOSIS — K219 Gastro-esophageal reflux disease without esophagitis: Secondary | ICD-10-CM | POA: Diagnosis present

## 2019-12-26 DIAGNOSIS — A419 Sepsis, unspecified organism: Secondary | ICD-10-CM | POA: Diagnosis present

## 2019-12-26 DIAGNOSIS — F0390 Unspecified dementia without behavioral disturbance: Secondary | ICD-10-CM | POA: Diagnosis present

## 2019-12-26 DIAGNOSIS — Z515 Encounter for palliative care: Secondary | ICD-10-CM | POA: Diagnosis not present

## 2019-12-26 DIAGNOSIS — K5730 Diverticulosis of large intestine without perforation or abscess without bleeding: Secondary | ICD-10-CM | POA: Diagnosis not present

## 2019-12-26 DIAGNOSIS — Z66 Do not resuscitate: Secondary | ICD-10-CM

## 2019-12-26 DIAGNOSIS — R531 Weakness: Secondary | ICD-10-CM | POA: Diagnosis not present

## 2019-12-26 DIAGNOSIS — K50919 Crohn's disease, unspecified, with unspecified complications: Secondary | ICD-10-CM | POA: Diagnosis not present

## 2019-12-26 DIAGNOSIS — Z7401 Bed confinement status: Secondary | ICD-10-CM | POA: Diagnosis not present

## 2019-12-26 DIAGNOSIS — G9341 Metabolic encephalopathy: Secondary | ICD-10-CM | POA: Diagnosis present

## 2019-12-26 DIAGNOSIS — R110 Nausea: Secondary | ICD-10-CM | POA: Diagnosis not present

## 2019-12-26 DIAGNOSIS — R0902 Hypoxemia: Secondary | ICD-10-CM | POA: Diagnosis not present

## 2019-12-26 DIAGNOSIS — Z888 Allergy status to other drugs, medicaments and biological substances status: Secondary | ICD-10-CM

## 2019-12-26 DIAGNOSIS — K5010 Crohn's disease of large intestine without complications: Secondary | ICD-10-CM | POA: Diagnosis present

## 2019-12-26 DIAGNOSIS — R109 Unspecified abdominal pain: Secondary | ICD-10-CM | POA: Diagnosis not present

## 2019-12-26 DIAGNOSIS — J181 Lobar pneumonia, unspecified organism: Secondary | ICD-10-CM | POA: Diagnosis not present

## 2019-12-26 DIAGNOSIS — N189 Chronic kidney disease, unspecified: Secondary | ICD-10-CM | POA: Diagnosis present

## 2019-12-26 DIAGNOSIS — E860 Dehydration: Secondary | ICD-10-CM | POA: Diagnosis present

## 2019-12-26 DIAGNOSIS — Z7952 Long term (current) use of systemic steroids: Secondary | ICD-10-CM

## 2019-12-26 DIAGNOSIS — K5792 Diverticulitis of intestine, part unspecified, without perforation or abscess without bleeding: Secondary | ICD-10-CM | POA: Diagnosis not present

## 2019-12-26 DIAGNOSIS — Z79899 Other long term (current) drug therapy: Secondary | ICD-10-CM

## 2019-12-26 DIAGNOSIS — N1832 Chronic kidney disease, stage 3b: Secondary | ICD-10-CM | POA: Diagnosis present

## 2019-12-26 DIAGNOSIS — R195 Other fecal abnormalities: Secondary | ICD-10-CM

## 2019-12-26 DIAGNOSIS — K529 Noninfective gastroenteritis and colitis, unspecified: Secondary | ICD-10-CM | POA: Diagnosis not present

## 2019-12-26 DIAGNOSIS — M1990 Unspecified osteoarthritis, unspecified site: Secondary | ICD-10-CM | POA: Diagnosis present

## 2019-12-26 DIAGNOSIS — Z7189 Other specified counseling: Secondary | ICD-10-CM

## 2019-12-26 DIAGNOSIS — F29 Unspecified psychosis not due to a substance or known physiological condition: Secondary | ICD-10-CM | POA: Diagnosis not present

## 2019-12-26 DIAGNOSIS — G309 Alzheimer's disease, unspecified: Secondary | ICD-10-CM | POA: Diagnosis not present

## 2019-12-26 DIAGNOSIS — R609 Edema, unspecified: Secondary | ICD-10-CM | POA: Diagnosis not present

## 2019-12-26 DIAGNOSIS — R32 Unspecified urinary incontinence: Secondary | ICD-10-CM | POA: Diagnosis present

## 2019-12-26 DIAGNOSIS — R918 Other nonspecific abnormal finding of lung field: Secondary | ICD-10-CM | POA: Diagnosis not present

## 2019-12-26 DIAGNOSIS — J90 Pleural effusion, not elsewhere classified: Secondary | ICD-10-CM | POA: Diagnosis not present

## 2019-12-26 DIAGNOSIS — M2550 Pain in unspecified joint: Secondary | ICD-10-CM | POA: Diagnosis not present

## 2019-12-26 DIAGNOSIS — J9600 Acute respiratory failure, unspecified whether with hypoxia or hypercapnia: Secondary | ICD-10-CM | POA: Diagnosis not present

## 2019-12-26 DIAGNOSIS — R519 Headache, unspecified: Secondary | ICD-10-CM | POA: Diagnosis not present

## 2019-12-26 DIAGNOSIS — Z87891 Personal history of nicotine dependence: Secondary | ICD-10-CM

## 2019-12-26 LAB — LACTIC ACID, PLASMA
Lactic Acid, Venous: 1.1 mmol/L (ref 0.5–1.9)
Lactic Acid, Venous: 1 mmol/L (ref 0.5–1.9)

## 2019-12-26 LAB — CBC WITH DIFFERENTIAL/PLATELET
Abs Immature Granulocytes: 0.24 10*3/uL — ABNORMAL HIGH (ref 0.00–0.07)
Basophils Absolute: 0 10*3/uL (ref 0.0–0.1)
Basophils Relative: 0 %
Eosinophils Absolute: 0 10*3/uL (ref 0.0–0.5)
Eosinophils Relative: 0 %
HCT: 31.8 % — ABNORMAL LOW (ref 36.0–46.0)
Hemoglobin: 10.5 g/dL — ABNORMAL LOW (ref 12.0–15.0)
Immature Granulocytes: 1 %
Lymphocytes Relative: 2 %
Lymphs Abs: 0.4 10*3/uL — ABNORMAL LOW (ref 0.7–4.0)
MCH: 32.7 pg (ref 26.0–34.0)
MCHC: 33 g/dL (ref 30.0–36.0)
MCV: 99.1 fL (ref 80.0–100.0)
Monocytes Absolute: 0.3 10*3/uL (ref 0.1–1.0)
Monocytes Relative: 1 %
Neutro Abs: 19.9 10*3/uL — ABNORMAL HIGH (ref 1.7–7.7)
Neutrophils Relative %: 96 %
Platelets: 422 10*3/uL — ABNORMAL HIGH (ref 150–400)
RBC: 3.21 MIL/uL — ABNORMAL LOW (ref 3.87–5.11)
RDW: 12.6 % (ref 11.5–15.5)
WBC: 20.8 10*3/uL — ABNORMAL HIGH (ref 4.0–10.5)
nRBC: 0 % (ref 0.0–0.2)

## 2019-12-26 LAB — CBC
HCT: 28.5 % — ABNORMAL LOW (ref 36.0–46.0)
Hemoglobin: 9.3 g/dL — ABNORMAL LOW (ref 12.0–15.0)
MCH: 32.6 pg (ref 26.0–34.0)
MCHC: 32.6 g/dL (ref 30.0–36.0)
MCV: 100 fL (ref 80.0–100.0)
Platelets: 364 10*3/uL (ref 150–400)
RBC: 2.85 MIL/uL — ABNORMAL LOW (ref 3.87–5.11)
RDW: 12.6 % (ref 11.5–15.5)
WBC: 15.5 10*3/uL — ABNORMAL HIGH (ref 4.0–10.5)
nRBC: 0 % (ref 0.0–0.2)

## 2019-12-26 LAB — COMPREHENSIVE METABOLIC PANEL
ALT: 11 U/L (ref 0–44)
AST: 17 U/L (ref 15–41)
Albumin: 3.3 g/dL — ABNORMAL LOW (ref 3.5–5.0)
Alkaline Phosphatase: 65 U/L (ref 38–126)
Anion gap: 14 (ref 5–15)
BUN: 69 mg/dL — ABNORMAL HIGH (ref 8–23)
CO2: 23 mmol/L (ref 22–32)
Calcium: 7.9 mg/dL — ABNORMAL LOW (ref 8.9–10.3)
Chloride: 104 mmol/L (ref 98–111)
Creatinine, Ser: 3.06 mg/dL — ABNORMAL HIGH (ref 0.44–1.00)
GFR calc Af Amer: 16 mL/min — ABNORMAL LOW (ref >60)
GFR calc non Af Amer: 14 mL/min — ABNORMAL LOW (ref >60)
Glucose, Bld: 138 mg/dL — ABNORMAL HIGH (ref 70–99)
Potassium: 2.9 mmol/L — ABNORMAL LOW (ref 3.5–5.1)
Sodium: 141 mmol/L (ref 135–145)
Total Bilirubin: 0.7 mg/dL (ref 0.3–1.2)
Total Protein: 6.9 g/dL (ref 6.5–8.1)

## 2019-12-26 LAB — URINALYSIS, ROUTINE W REFLEX MICROSCOPIC
Bilirubin Urine: NEGATIVE
Glucose, UA: NEGATIVE mg/dL
Ketones, ur: NEGATIVE mg/dL
Leukocytes,Ua: NEGATIVE
Nitrite: NEGATIVE
Protein, ur: 100 mg/dL — AB
Specific Gravity, Urine: 1.009 (ref 1.005–1.030)
pH: 6 (ref 5.0–8.0)

## 2019-12-26 LAB — PROTIME-INR
INR: 0.9 (ref 0.8–1.2)
Prothrombin Time: 11.7 seconds (ref 11.4–15.2)

## 2019-12-26 LAB — TROPONIN I (HIGH SENSITIVITY)
Troponin I (High Sensitivity): 130 ng/L (ref <18)
Troponin I (High Sensitivity): 126 ng/L (ref <18)

## 2019-12-26 LAB — POC OCCULT BLOOD, ED: Fecal Occult Bld: POSITIVE — AB

## 2019-12-26 LAB — CREATININE, SERUM
Creatinine, Ser: 2.99 mg/dL — ABNORMAL HIGH (ref 0.44–1.00)
GFR calc Af Amer: 17 mL/min — ABNORMAL LOW (ref >60)
GFR calc non Af Amer: 14 mL/min — ABNORMAL LOW (ref >60)

## 2019-12-26 LAB — SARS CORONAVIRUS 2 BY RT PCR (HOSPITAL ORDER, PERFORMED IN ~~LOC~~ HOSPITAL LAB): SARS Coronavirus 2: NEGATIVE

## 2019-12-26 MED ORDER — SENNOSIDES-DOCUSATE SODIUM 8.6-50 MG PO TABS: 1.0000 | ORAL_TABLET | Freq: Every evening | ORAL | Status: DC | PRN

## 2019-12-26 MED ORDER — POTASSIUM CHLORIDE 2 MEQ/ML IV SOLN
INTRAVENOUS | Status: DC
  Filled 2019-12-26 (×4): qty 1000

## 2019-12-26 MED ORDER — METRONIDAZOLE IN NACL 5-0.79 MG/ML-% IV SOLN
500.0000 mg | Freq: Once | INTRAVENOUS | Status: AC
  Administered 2019-12-26: 500 mg via INTRAVENOUS
  Filled 2019-12-26: qty 100

## 2019-12-26 MED ORDER — SALINE SPRAY 0.65 % NA SOLN: 1.0000 | NASAL | Status: DC | PRN

## 2019-12-26 MED ORDER — B COMPLEX-C PO TABS
1.0000 | ORAL_TABLET | Freq: Every day | ORAL | Status: DC
  Administered 2019-12-27 – 2019-12-30 (×4): 1 via ORAL
  Filled 2019-12-26 (×5): qty 1

## 2019-12-26 MED ORDER — OXYCODONE HCL 5 MG PO TABS
5.0000 mg | ORAL_TABLET | ORAL | Status: DC | PRN
  Administered 2019-12-27 – 2019-12-30 (×3): 5 mg via ORAL
  Filled 2019-12-26 (×3): qty 1

## 2019-12-26 MED ORDER — URSODIOL 300 MG PO CAPS
300.0000 mg | ORAL_CAPSULE | Freq: Two times a day (BID) | ORAL | Status: DC
  Administered 2019-12-26 – 2019-12-30 (×6): 300 mg via ORAL
  Filled 2019-12-26 (×11): qty 1

## 2019-12-26 MED ORDER — SIMVASTATIN 20 MG PO TABS
10.0000 mg | ORAL_TABLET | Freq: Every day | ORAL | Status: DC
  Administered 2019-12-27 – 2019-12-29 (×3): 10 mg via ORAL
  Filled 2019-12-26 (×3): qty 1

## 2019-12-26 MED ORDER — SODIUM CHLORIDE 0.9 % IV SOLN
500.0000 mg | INTRAVENOUS | Status: DC
  Administered 2019-12-27 – 2019-12-29 (×3): 500 mg via INTRAVENOUS
  Filled 2019-12-26 (×3): qty 500

## 2019-12-26 MED ORDER — MEMANTINE HCL 10 MG PO TABS
10.0000 mg | ORAL_TABLET | Freq: Two times a day (BID) | ORAL | Status: DC
  Administered 2019-12-26 – 2019-12-30 (×6): 10 mg via ORAL
  Filled 2019-12-26 (×7): qty 1

## 2019-12-26 MED ORDER — ONDANSETRON HCL 4 MG/2ML IJ SOLN
4.0000 mg | Freq: Four times a day (QID) | INTRAMUSCULAR | Status: DC | PRN
  Administered 2019-12-28 – 2019-12-30 (×5): 4 mg via INTRAVENOUS
  Filled 2019-12-26 (×5): qty 2

## 2019-12-26 MED ORDER — PREDNISONE 20 MG PO TABS
20.0000 mg | ORAL_TABLET | Freq: Every day | ORAL | Status: DC
  Administered 2019-12-27 – 2019-12-30 (×4): 20 mg via ORAL
  Filled 2019-12-26 (×4): qty 1

## 2019-12-26 MED ORDER — POTASSIUM CHLORIDE CRYS ER 20 MEQ PO TBCR
40.0000 meq | EXTENDED_RELEASE_TABLET | Freq: Two times a day (BID) | ORAL | Status: AC
  Administered 2019-12-26 – 2019-12-27 (×2): 40 meq via ORAL
  Filled 2019-12-26 (×2): qty 2

## 2019-12-26 MED ORDER — KCL-LACTATED RINGERS 20 MEQ/L IV SOLN
INTRAVENOUS | Status: DC
  Filled 2019-12-26 (×2): qty 1000

## 2019-12-26 MED ORDER — PANTOPRAZOLE SODIUM 40 MG IV SOLR
40.0000 mg | Freq: Every day | INTRAVENOUS | Status: DC
  Administered 2019-12-26 – 2019-12-29 (×4): 40 mg via INTRAVENOUS
  Filled 2019-12-26 (×4): qty 40

## 2019-12-26 MED ORDER — AMLODIPINE BESYLATE 10 MG PO TABS
10.0000 mg | ORAL_TABLET | Freq: Every day | ORAL | Status: DC
  Administered 2019-12-27 – 2019-12-30 (×4): 10 mg via ORAL
  Filled 2019-12-26: qty 1
  Filled 2019-12-26: qty 2
  Filled 2019-12-26 (×2): qty 1

## 2019-12-26 MED ORDER — ACETAMINOPHEN 325 MG PO TABS
650.0000 mg | ORAL_TABLET | Freq: Four times a day (QID) | ORAL | Status: DC | PRN
  Administered 2019-12-27 – 2019-12-29 (×2): 650 mg via ORAL
  Filled 2019-12-26 (×3): qty 2

## 2019-12-26 MED ORDER — HEPARIN SODIUM (PORCINE) 5000 UNIT/ML IJ SOLN: 5000.0000 [IU] | Freq: Three times a day (TID) | INTRAMUSCULAR | Status: DC

## 2019-12-26 MED ORDER — SODIUM CHLORIDE 0.9 % IV SOLN
500.0000 mg | Freq: Once | INTRAVENOUS | Status: AC
  Administered 2019-12-26: 500 mg via INTRAVENOUS
  Filled 2019-12-26: qty 500

## 2019-12-26 MED ORDER — METRONIDAZOLE IN NACL 5-0.79 MG/ML-% IV SOLN
500.0000 mg | Freq: Three times a day (TID) | INTRAVENOUS | Status: DC
  Administered 2019-12-26 – 2019-12-28 (×5): 500 mg via INTRAVENOUS
  Filled 2019-12-26 (×5): qty 100

## 2019-12-26 MED ORDER — SODIUM CHLORIDE 0.9 % IV SOLN
1.0000 g | Freq: Once | INTRAVENOUS | Status: AC
  Administered 2019-12-26: 1 g via INTRAVENOUS
  Filled 2019-12-26: qty 10

## 2019-12-26 MED ORDER — SODIUM CHLORIDE 0.9 % IV BOLUS
1000.0000 mL | Freq: Once | INTRAVENOUS | Status: AC
  Administered 2019-12-26: 1000 mL via INTRAVENOUS

## 2019-12-26 MED ORDER — SODIUM CHLORIDE 0.9 % IV SOLN
2.0000 g | INTRAVENOUS | Status: DC
  Administered 2019-12-27: 2 g via INTRAVENOUS
  Filled 2019-12-26: qty 2

## 2019-12-26 MED ORDER — CALCITONIN (SALMON) 200 UNIT/ACT NA SOLN
1.0000 | Freq: Every day | NASAL | Status: DC
  Administered 2019-12-27 – 2019-12-30 (×4): 1 via NASAL
  Filled 2019-12-26: qty 3.7

## 2019-12-26 MED ORDER — FLUOXETINE HCL 20 MG PO CAPS
20.0000 mg | ORAL_CAPSULE | Freq: Every day | ORAL | Status: DC
  Administered 2019-12-27 – 2019-12-30 (×4): 20 mg via ORAL
  Filled 2019-12-26 (×4): qty 1

## 2019-12-26 MED ORDER — MIRTAZAPINE 15 MG PO TABS
15.0000 mg | ORAL_TABLET | Freq: Every day | ORAL | Status: DC
  Administered 2019-12-26 – 2019-12-28 (×3): 15 mg via ORAL
  Filled 2019-12-26: qty 2
  Filled 2019-12-26 (×2): qty 1

## 2019-12-26 MED ORDER — ONDANSETRON HCL 4 MG PO TABS: 4.0000 mg | ORAL_TABLET | Freq: Four times a day (QID) | ORAL | Status: DC | PRN

## 2019-12-26 MED ORDER — ACETAMINOPHEN 650 MG RE SUPP: 650.0000 mg | Freq: Four times a day (QID) | RECTAL | Status: DC | PRN

## 2019-12-26 MED ORDER — POTASSIUM CHLORIDE 10 MEQ/100ML IV SOLN
10.0000 meq | INTRAVENOUS | Status: AC
  Administered 2019-12-26 (×2): 10 meq via INTRAVENOUS
  Filled 2019-12-26: qty 100

## 2019-12-26 MED ORDER — ACETAMINOPHEN 325 MG PO TABS: 650.0000 mg | ORAL_TABLET | Freq: Four times a day (QID) | ORAL | Status: DC | PRN

## 2019-12-26 NOTE — H&P (Addendum)
History and Physical    [name] [name] SWH:[id number] DOB: May 11, 1941 DOA: 12/26/2019  PCP: Hoyt Koch, MD   Chief Complaint: Generalized weakness  HPI: [name] [name] is a 79 y.o. female with medical history significant of advanced dementia, Crohn's colitis, GERD, hypertension, CKD 2, unspecified depression, arthritis; patient presents via EMS from home, son over the phone indicates she has had worsening mental status although baseline is alert and oriented to person only given advanced dementia but seems less like herself, poor p.o. intake, less active and seems somewhat "down" over the past 3 to 4 days.  Is a remarkably poor historian but indicates "pain all over" with very unreliable history and physical exam given her exam changes depending on where her focus is.  In route EMS noted fever of 101.2 treated with ibuprofen.  ED Course: In the ED patient had further work-up including routine lab work remarkable for hypokalemia, hyperglycemia, elevated creatinine from baseline as well as leukocytosis and anemia.  Patient's fecal occult also is positive, patient also has minimally elevated troponin without chest pain but again is an unreliable historian.  Patient had imaging also notable for possible colitis and possible pneumonia. Given profound lab abnormalities, patient's mental status changes per family, abnormal imaging, and multiple issues needing further evaluation and work-up hospitalist was called for admission  Review of Systems: As per HPI patient's review of systems is markedly limited given baseline mental status.   Assessment/Plan Principal Problem:   Acute metabolic encephalopathy Active Problems:   Crohn's disease of colon with rectal bleeding (HCC)   Hypertension   Long term (current) use of systemic steroids-prednisone   Esophageal reflux   Hyperglycemia   Dementia (HCC)   Severe major depression (HCC)   CKD (chronic kidney disease)   Acute metabolic  encephalopathy on advanced dementia, multifactorial -Likely multifactorial given below sepsis with 2 suspected sources of infection, poor p.o. intake, electrolyte abnormalities, acute anemia as well as severe depression and advanced dementia -Patient remains high risk for hospital delirium, family aware, encouraged to be at bedside to improve patient's orientation  Sepsis, multifactorial in the setting of pneumonia, CAP versus aspiration as well as likely concurrent colitis given imaging Study of Crohn's colitis -New ceftriaxone, azithromycin, metronidazole to cover pneumonia, aspiration versus CAP as well as abdominal colitis as seen on CT, possibly in the setting of Crohn's flare -Patient remains on chronic steroids, continue these in the inpatient setting, consider pulse dose steroids if no improvement in the next 24 to 48 hours -Tinea IV fluids  AKI on CKD 2  - Creatinine currently 3.06 baseline appears to be around 1.5 -Likely in the setting of poor p.o. intake and acute sepsis as above -Continue IV fluids, follow clinically  Acute blood loss anemia on chronic anemia of chronic disease.  Rule out GI bleed in the setting of colitis as above. -Anemia is mild comparatively, although if hemoconcentrated from dehydration anemia more profound than noted on labs at 10.5 with baseline around 13 -Continue to follow morning labs, transfuse if symptomatic or below 7 -Hold off on GI consult given colitis on imaging and scant anemia, certainly if anemia worsens or patient has a large volume bloody bowel movement GI can be consulted for further evaluation and possible intervention as needed.  Hypokalemia, moderate  -Repleted with IV fluids, follow morning labs  Minimally elevated troponin, downtrending likely NSTEMI type II  -Minimally elevated troponin in the setting of dehydration poor p.o. intake, no chest pain, EKG without ST elevation  or depression -Given downtrending troponin no need for repeat  labs or cardiology evaluation unless patient clinically changes  Hypertension -Appears to be well controlled, continue home medications  Depression/dementia -Continue home medications including Prozac, Namenda, Remeron -As above patient remains high risk for delirium and sundowning in the hospital setting  GERD -Patient's abdominal exam is quite benign, imaging unremarkable for gastritis -Continue PPI  DVT prophylaxis: Currently holding DVT prophylaxis due to fecal occult positive stool and anemia Code Status: Full Family Communication: Son updated over the phone Status is: Inpatient  Dispo: The patient is from: Home              Anticipated d/c is to: Unclear, pending further evaluation work-up              Anticipated d/c date is: Likely 72 to 96 hours pending clinical work-up              Patient currently not medically stable for discharge the need for IV fluids, close monitoring and possible further imaging or evaluation pending clinical course.  Consultants:   None  Procedures:   None planned   Past Medical History:  Diagnosis Date  . Allergy    year around allergies   . Arthritis    shoulder  . Bell's palsy   . Blood transfusion    1966  . Blood transfusion without reported diagnosis   . Cataract    bilaterally removed  . Colon polyps    adenomatous polyp June 2012  . Complication of anesthesia    slow to wake  . Crohn's colitis ([place])   . Dry eyes    left eye worst one  . GERD (gastroesophageal reflux disease)   . Headache(784.0)    hx migraines  . Hypertension   . Lumbar compression fracture, closed, initial encounter ([place]) 06/02/2019  . Protein-calorie malnutrition ([place]) 03/06/2019  . Severe major depression ([place]) 03/03/2019  . Shortness of breath     Past Surgical History:  Procedure Laterality Date  . ABDOMINAL HYSTERECTOMY    . APPENDECTOMY    . CATARACT EXTRACTION Bilateral    Separate dates  . CHOLECYSTECTOMY    . COLONOSCOPY  multiple    . flex sigmoidoscopy with biospy  01/17/12  . FLEXIBLE SIGMOIDOSCOPY  06/08/2011   severe Crohn's colitis to descending colon at least  . FOOT GANGLION EXCISION     left  . SHOULDER SURGERY     right  . TONSILLECTOMY       reports that she quit smoking about 51 years ago. Her smoking use included cigarettes. She has a 2.50 pack-year smoking history. She has never used smokeless tobacco. She reports previous alcohol use. She reports that she does not use drugs.  Allergies  Allergen Reactions  . Azathioprine Nausea Only    Elevated lipase also - ? Pancreatitis   . Entyvio [Vedolizumab] Shortness Of Breath  . Adhesive [Tape] Rash    Taking skin off  . Sulfa Antibiotics Itching and Rash    Family History  Problem Relation Age of Onset  . Leukemia Mother   . Heart disease Mother   . Heart disease Father   . Heart attack Father   . Ovarian cancer Paternal Aunt        1/2 aunt  . Breast cancer Paternal Aunt        1/2 aunt  . Throat cancer Paternal Aunt        1/2 aunt  . Parkinsonism Brother   .  Heart disease Brother   . Leukemia Maternal Grandfather   . Anesthesia problems Neg Hx   . Hypotension Neg Hx   . Malignant hyperthermia Neg Hx   . Pseudochol deficiency Neg Hx   . Colon cancer Neg Hx   . Diabetes Neg Hx   . Esophageal cancer Neg Hx   . Rectal cancer Neg Hx   . Stomach cancer Neg Hx     Prior to Admission medications   Medication Sig Start Date End Date Taking? Authorizing Provider  acetaminophen (TYLENOL) 325 MG tablet Take 650 mg by mouth every 6 (six) hours as needed for moderate pain.    [provider]  amLODipine (NORVASC) 10 MG tablet Take 1 tablet (10 mg total) by mouth daily. 04/24/19   Hoyt Koch, MD  B Complex-Folic Acid (B COMPLEX FORMULA 1) TABS Take 1 tablet by mouth daily.    [provider]  calcitonin, salmon, (MIACALCIN/FORTICAL) 200 UNIT/ACT nasal spray PLACE 1 SPRAY INTO ALTERNATE NOSTRILS DAILY. 12/22/19    Hoyt Koch, MD  FLUoxetine (PROZAC) 20 MG capsule Take 1 capsule (20 mg total) by mouth daily. 04/21/19   [name], MD  hydrochlorothiazide (HYDRODIURIL) 25 MG tablet Take 1 tablet (25 mg total) by mouth daily. 04/21/19   [name], MD  memantine (NAMENDA) 10 MG tablet TAKE 1 TABLET EVERY NIGHT FOR 2 WEEKS, THEN INCREASE TO 1 TABLET TWICE A DAY 08/13/19   Cameron Sprang, MD  mirtazapine (REMERON) 15 MG tablet Take 1 tablet (15 mg total) by mouth at bedtime. 10/27/19   [name], MD  oxyCODONE (OXY IR/ROXICODONE) 5 MG immediate release tablet Take 1 tablet (5 mg total) by mouth every 4 (four) hours as needed for moderate pain. 06/03/19   Lorella Nimrod, MD  predniSONE (DELTASONE) 10 MG tablet TAKE 2 TABLETS (20 MG TOTAL) BY MOUTH DAILY WITH BREAKFAST. 10/12/19   [name], MD  simvastatin (ZOCOR) 10 MG tablet Take 1 tablet (10 mg total) by mouth daily at 6 PM. 10/19/19   Hoyt Koch, MD  sodium chloride (OCEAN) 0.65 % SOLN nasal spray Place 1 spray into both nostrils as needed for congestion.    [provider]  ursodiol (ACTIGALL) 300 MG capsule Take 1 capsule (300 mg total) by mouth 2 (two) times daily. 04/03/19   [name], MD  lovastatin (MEVACOR) 20 MG tablet Please specify directions, refills and quantity 10/19/19 10/19/19  Hoyt Koch, MD  pravastatin (PRAVACHOL) 20 MG tablet Take 1 tablet (20 mg total) by mouth daily. 04/29/19 10/19/19  Hoyt Koch, MD    Physical Exam: Vitals:   12/26/19 1445 12/26/19 1500 12/26/19 1515 12/26/19 1600  BP: (!) 170/92 (!) 168/96 (!) 168/89 (!) 168/91  Pulse: 80 79 77 84  Resp: 14 (!) 9 15 12   Temp:      TempSrc:      SpO2: 94% 98% 95% 100%    Constitutional: NAD, calm, comfortable Vitals:   12/26/19 1445 12/26/19 1500 12/26/19 1515 12/26/19 1600  BP: (!) 170/92 (!) 168/96 (!) 168/89 (!) 168/91  Pulse: 80 79 77 84  Resp: 14 (!) 9 15 12   Temp:      TempSrc:      SpO2: 94% 98%  95% 100%   General:  Pleasantly resting in bed, No acute distress.  Alert to person only. HEENT:  Normocephalic atraumatic.  Sclerae nonicteric, noninjected.  Extraocular movements intact bilaterally. Neck:  Without mass or  deformity.  Trachea is midline. Lungs:  Clear to auscultate bilaterally without rhonchi, wheeze, or rales. Heart:  Regular rate and rhythm.  Without murmurs, rubs, or gallops. Abdomen:  Soft, nontender, nondistended.  Without guarding or rebound. Extremities: Without cyanosis, clubbing, edema, or obvious deformity. Vascular:  Dorsalis pedis and posterior tibial pulses palpable bilaterally. Skin:  Warm and dry, no erythema, no ulcerations.  Labs on Admission: I have personally reviewed following labs and imaging studies  CBC: Recent Labs  Lab 12/26/19 1324  WBC 20.8*  NEUTROABS 19.9*  HGB 10.5*  HCT 31.8*  MCV 99.1  PLT 235*   Basic Metabolic Panel: Recent Labs  Lab 12/26/19 1324  NA 141  K 2.9*  CL 104  CO2 23  GLUCOSE 138*  BUN 69*  CREATININE 3.06*  CALCIUM 7.9*   GFR: CrCl cannot be calculated (Unknown ideal weight.). Liver Function Tests: Recent Labs  Lab 12/26/19 1324  AST 17  ALT 11  ALKPHOS 65  BILITOT 0.7  PROT 6.9  ALBUMIN 3.3*   No results for input(s): LIPASE, AMYLASE in the last 168 hours. No results for input(s): AMMONIA in the last 168 hours. Coagulation Profile: Recent Labs  Lab 12/26/19 1324  INR 0.9   Cardiac Enzymes: No results for input(s): CKTOTAL, CKMB, CKMBINDEX, TROPONINI in the last 168 hours. BNP (last 3 results) No results for input(s): PROBNP in the last 8760 hours. HbA1C: No results for input(s): HGBA1C in the last 72 hours. CBG: No results for input(s): GLUCAP in the last 168 hours. Lipid Profile: No results for input(s): CHOL, HDL, LDLCALC, TRIG, CHOLHDL, LDLDIRECT in the last 72 hours. Thyroid Function Tests: No results for input(s): TSH, T4TOTAL, FREET4, T3FREE, THYROIDAB in the last 72  hours. Anemia Panel: No results for input(s): VITAMINB12, FOLATE, FERRITIN, TIBC, IRON, RETICCTPCT in the last 72 hours. Urine analysis:    Component Value Date/Time   COLORURINE YELLOW 12/26/2019 1325   APPEARANCEUR CLEAR 12/26/2019 1325   LABSPEC 1.009 12/26/2019 1325   PHURINE 6.0 12/26/2019 1325   GLUCOSEU NEGATIVE 12/26/2019 1325   HGBUR LARGE (A) 12/26/2019 1325   BILIRUBINUR NEGATIVE 12/26/2019 1325   KETONESUR NEGATIVE 12/26/2019 1325   PROTEINUR 100 (A) 12/26/2019 1325   UROBILINOGEN 0.2 12/13/2014 1506   NITRITE NEGATIVE 12/26/2019 1325   LEUKOCYTESUR NEGATIVE 12/26/2019 1325    Radiological Exams on Admission: CT Head Wo Contrast  Result Date: 12/26/2019 CLINICAL DATA:  Encephalopathy. Unable to ambulate today. Reduced oral intake. Urinary incontinence. Malodorous urine. EXAM: CT HEAD WITHOUT CONTRAST TECHNIQUE: Contiguous axial images were obtained from the base of the skull through the vertex without intravenous contrast. COMPARISON:  08/31/2009 FINDINGS: Brain: The brainstem, cerebellum, cerebral peduncles, thalami, basal ganglia, basilar cisterns, and ventricular system appear within normal limits. No intracranial hemorrhage, mass lesion, or acute CVA. Periventricular white matter and corona radiata hypodensities favor chronic ischemic microvascular white matter disease. Vascular: Unremarkable Skull: Unremarkable Sinuses/Orbits: Unremarkable Other: No supplemental non-categorized findings. IMPRESSION: 1. No acute intracranial findings. 2. Periventricular white matter and corona radiata hypodensities favor chronic ischemic microvascular white matter disease. Electronically Signed   By: Van Clines M.D.   On: 12/26/2019 15:48   CT Chest Wo Contrast  Result Date: 12/26/2019 CLINICAL DATA:  Right upper lung opacity chest radiography for further characterization. Left lung opacities also noted. Altered mental status. EXAM: CT CHEST, ABDOMEN AND PELVIS WITHOUT CONTRAST  TECHNIQUE: Multidetector CT imaging of the chest, abdomen and pelvis was performed following the standard protocol without IV contrast. COMPARISON:  Multiple exams, including chest radiograph from 12/26/2019 and CT abdomen from 01/27/2019 FINDINGS: CT CHEST FINDINGS Cardiovascular: Coronary, aortic arch, and branch vessel atherosclerotic vascular disease. Mild cardiomegaly. Mediastinum/Nodes: Unremarkable Lungs/Pleura: Scattered scarring in the lungs. Confluent ground-glass opacities inferiorly in the right upper lobe and in the right middle lobe. Less degree of hazy ground-glass opacity posteriorly in the left upper lobe. Old granulomatous disease. Small left and trace right pleural effusion. Musculoskeletal: Small ossicles in the bicipital groove likely represent free osteochondral fragments in the right biceps recess of the glenohumeral joint. Similar findings on the left. Prior vertebral augmentations at T7 and T9. Age indeterminate endplate compression fractures at T8, T11, T12, and L1. CT ABDOMEN PELVIS FINDINGS Hepatobiliary: Mild lobularity of the a hepatic contour could reflect early cirrhosis. Cholecystectomy. No appreciable significant degree of biliary dilatation. Pancreas: Unremarkable Spleen: Unremarkable Adrenals/Urinary Tract: Both adrenal glands appear normal. Low-density lobularity from the left kidney upper pole, probably a cyst although technically nonspecific, measuring about 3.1 by 1.5 cm, previously measuring about 2.3 by 1.4 cm on 01/27/2019. No urinary tract calculi, hydronephrosis, or hydroureter. Urinary bladder unremarkable, without wall thickening. Stomach/Bowel: Periampullary duodenal diverticulum. Descending and sigmoid colon diverticulosis. There is some stranding along the margin of the proximal sigmoid colon on image 87/4 suggesting mild acute diverticulitis. Vascular/Lymphatic: Aortoiliac atherosclerotic vascular disease. Reproductive: Uterus absent.  Adnexa unremarkable. Other:  Trace free pelvic fluid, etiology uncertain. Musculoskeletal: Postoperative findings in the lower lumbar spine. Grade 1 degenerative anterolisthesis at L4-5. Probably late subacute superior endplate compression fractures at L1, L2, and L4 (the L1 and L2 compressions were not present on 01/27/2019 and the L4 compression of the superior endplate was less striking on that exam). Posterior decompression at L4-L5-S1. IMPRESSION: 1. Confluent ground-glass opacities inferiorly in the right upper lobe and in the right middle lobe, with lesser degree of hazy ground-glass opacity posteriorly in the left upper lobe. Appearance favors atypical pneumonia over asymmetric edema. 2. Descending and sigmoid colon diverticulosis with mild proximal sigmoid colon diverticulitis. No extraluminal gas or abscess. 3. Small left and trace right pleural effusions. 4. Other imaging findings of potential clinical significance: Coronary atherosclerosis with mild cardiomegaly. Mild lobularity of the a hepatic contour could reflect early cirrhosis. Periampullary duodenal diverticulum. Trace free pelvic fluid, etiology uncertain. Descending and sigmoid colon diverticulosis. Multilevel compression fractures in the spine including T7, T8, T9, T11, T12, L1, L2, and L4. Posterior decompression at L4-L5-S1. Small free osteochondral fragments in the glenohumeral joints. Cardiomegaly. 5. Aortic atherosclerosis. Aortic Atherosclerosis (ICD10-I70.0). Electronically Signed   By: Van Clines M.D.   On: 12/26/2019 16:01   DG Chest Port 1 View  Result Date: 12/26/2019 CLINICAL DATA:  Generalized weakness EXAM: PORTABLE CHEST 1 VIEW COMPARISON:  Chest radiograph 01/27/2019 FINDINGS: Monitoring leads overlie the patient. Stable enlarged cardiac and mediastinal contours. Heterogeneous opacities left lung base. Possible small left pleural effusion. No pneumothorax. Irregular opacity within the right upper lung. IMPRESSION: 1. Heterogeneous opacities left  lung base may represent atelectasis or infection. 2. Irregular opacity within the right upper lung is nonspecific. Underlying pulmonary nodule/mass is not excluded. This needs dedicated evaluation with chest CT. Electronically Signed   By: Lovey Newcomer M.D.   On: 12/26/2019 14:52   CT Renal Stone Study  Result Date: 12/26/2019 CLINICAL DATA:  Right upper lung opacity chest radiography for further characterization. Left lung opacities also noted. Altered mental status. EXAM: CT CHEST, ABDOMEN AND PELVIS WITHOUT CONTRAST TECHNIQUE: Multidetector CT imaging of the chest, abdomen and pelvis was  performed following the standard protocol without IV contrast. COMPARISON:  Multiple exams, including chest radiograph from 12/26/2019 and CT abdomen from 01/27/2019 FINDINGS: CT CHEST FINDINGS Cardiovascular: Coronary, aortic arch, and branch vessel atherosclerotic vascular disease. Mild cardiomegaly. Mediastinum/Nodes: Unremarkable Lungs/Pleura: Scattered scarring in the lungs. Confluent ground-glass opacities inferiorly in the right upper lobe and in the right middle lobe. Less degree of hazy ground-glass opacity posteriorly in the left upper lobe. Old granulomatous disease. Small left and trace right pleural effusion. Musculoskeletal: Small ossicles in the bicipital groove likely represent free osteochondral fragments in the right biceps recess of the glenohumeral joint. Similar findings on the left. Prior vertebral augmentations at T7 and T9. Age indeterminate endplate compression fractures at T8, T11, T12, and L1. CT ABDOMEN PELVIS FINDINGS Hepatobiliary: Mild lobularity of the a hepatic contour could reflect early cirrhosis. Cholecystectomy. No appreciable significant degree of biliary dilatation. Pancreas: Unremarkable Spleen: Unremarkable Adrenals/Urinary Tract: Both adrenal glands appear normal. Low-density lobularity from the left kidney upper pole, probably a cyst although technically nonspecific, measuring about  3.1 by 1.5 cm, previously measuring about 2.3 by 1.4 cm on 01/27/2019. No urinary tract calculi, hydronephrosis, or hydroureter. Urinary bladder unremarkable, without wall thickening. Stomach/Bowel: Periampullary duodenal diverticulum. Descending and sigmoid colon diverticulosis. There is some stranding along the margin of the proximal sigmoid colon on image 87/4 suggesting mild acute diverticulitis. Vascular/Lymphatic: Aortoiliac atherosclerotic vascular disease. Reproductive: Uterus absent.  Adnexa unremarkable. Other: Trace free pelvic fluid, etiology uncertain. Musculoskeletal: Postoperative findings in the lower lumbar spine. Grade 1 degenerative anterolisthesis at L4-5. Probably late subacute superior endplate compression fractures at L1, L2, and L4 (the L1 and L2 compressions were not present on 01/27/2019 and the L4 compression of the superior endplate was less striking on that exam). Posterior decompression at L4-L5-S1. IMPRESSION: 1. Confluent ground-glass opacities inferiorly in the right upper lobe and in the right middle lobe, with lesser degree of hazy ground-glass opacity posteriorly in the left upper lobe. Appearance favors atypical pneumonia over asymmetric edema. 2. Descending and sigmoid colon diverticulosis with mild proximal sigmoid colon diverticulitis. No extraluminal gas or abscess. 3. Small left and trace right pleural effusions. 4. Other imaging findings of potential clinical significance: Coronary atherosclerosis with mild cardiomegaly. Mild lobularity of the a hepatic contour could reflect early cirrhosis. Periampullary duodenal diverticulum. Trace free pelvic fluid, etiology uncertain. Descending and sigmoid colon diverticulosis. Multilevel compression fractures in the spine including T7, T8, T9, T11, T12, L1, L2, and L4. Posterior decompression at L4-L5-S1. Small free osteochondral fragments in the glenohumeral joints. Cardiomegaly. 5. Aortic atherosclerosis. Aortic Atherosclerosis  (ICD10-I70.0). Electronically Signed   By: Van Clines M.D.   On: 12/26/2019 16:01   EKG: Independently reviewed.  Normal sinus rhythm, borderline QTC  Little Ishikawa DO [facility] For contact please use secure messenger on Epic  If 7PM-7AM, please contact night-coverage located on [email or web address]   12/26/2019, 4:32 PM

## 2019-12-26 NOTE — ED Provider Notes (Signed)
[facility] DEPT Provider Note   CSN: [id number] Arrival date & time: 12/26/19  1309     History No chief complaint on file. cc - weakness, fever  [name] [name] is a 79 y.o. female.  Level 5 caveat secondary to dementia.  Initial history primarily obtained from EMS.  79 year old female with history of dementia lives at home with her son.  Few days of decreased p.o. intake, decreased activity.  Some urinary incontinence.  Mental status at baseline alert and oriented x1.  Patient states she hurts all over.  Otherwise is not able to give anything reliable history.  She received 800 ibuprofen and 4 of Zofran by EMS.  The history is provided by the patient and the EMS personnel.  Fever Max temp prior to arrival:  101 Severity:  Moderate Onset quality:  Unable to specify Timing:  Unable to specify Progression:  Unchanged Chronicity:  New Relieved by:  None tried Worsened by:  Nothing Ineffective treatments:  None tried Associated symptoms: myalgias   Risk factors: immunosuppression        Past Medical History:  Diagnosis Date  . Allergy    year around allergies   . Arthritis    shoulder  . Bell's palsy   . Blood transfusion    1966  . Blood transfusion without reported diagnosis   . Cataract    bilaterally removed  . Colon polyps    adenomatous polyp June 2012  . Complication of anesthesia    slow to wake  . Crohn's colitis ([place])   . Dry eyes    left eye worst one  . GERD (gastroesophageal reflux disease)   . Headache(784.0)    hx migraines  . Hypertension   . Lumbar compression fracture, closed, initial encounter ([place]) 06/02/2019  . Protein-calorie malnutrition ([place]) 03/06/2019  . Severe major depression ([place]) 03/03/2019  . Shortness of breath     Patient Active Problem List   Diagnosis Date Noted  . Intractable back pain 06/03/2019  . Lumbar compression fracture, closed, initial encounter ([place]) 06/02/2019  . Thoracic compression  fracture ([place]) 06/02/2019  . CKD (chronic kidney disease) 06/02/2019  . 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 Severe major depression ([place]) 03/03/2019  . 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 Venous insufficiency 01/28/2018  . Routine general medical examination at a health care facility 01/10/2018  . Iron deficiency anemia due to chronic blood loss 12/18/2017  . Thrombocytosis ([place]) 12/11/2017  . Dementia ([place]) 11/01/2017  . Lumbar radiculopathy 12/31/2016  . Neck pain 03/05/2016  . Cervical paraspinal muscle spasm 03/01/2016  . H/O Bell's palsy 01/09/2016  . 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 Cough variant asthma vs uacs vs bronchiectasis 01/14/2015  . Hyperglycemia 10/11/2014  . Esophageal reflux 09/22/2014  . Long term (current) use of systemic steroids-prednisone 08/25/2012  . 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 Immunosuppression - on chronic prednisone, hx of Remicade and azathioprine 07/09/2011  . Crohn's disease of colon with rectal bleeding ([place])   . Hypertension   . Arthritis     Past Surgical History:  Procedure Laterality Date  . ABDOMINAL HYSTERECTOMY    . APPENDECTOMY    . CATARACT EXTRACTION Bilateral    Separate dates  . CHOLECYSTECTOMY    . COLONOSCOPY  multiple  . flex sigmoidoscopy with biospy  01/17/12  . FLEXIBLE SIGMOIDOSCOPY  06/08/2011   severe Crohn's colitis to descending colon at least  . FOOT GANGLION EXCISION     left  .  SHOULDER SURGERY     right  . TONSILLECTOMY       OB History   No obstetric history on file.     Family History  Problem Relation Age of Onset  . Leukemia Mother   . Heart disease Mother   . Heart disease Father   . Heart attack Father   . Ovarian cancer Paternal Aunt        1/2 aunt  . Breast cancer Paternal Aunt        1/2 aunt  . Throat cancer Paternal Aunt        1/2 aunt  . Parkinsonism Brother   . Heart disease Brother   . Leukemia Maternal Grandfather   . Anesthesia  problems Neg Hx   . Hypotension Neg Hx   . Malignant hyperthermia Neg Hx   . Pseudochol deficiency Neg Hx   . Colon cancer Neg Hx   . Diabetes Neg Hx   . Esophageal cancer Neg Hx   . Rectal cancer Neg Hx   . Stomach cancer Neg Hx     Social History   Tobacco Use  . Smoking status: Former Smoker    Packs/day: 0.25    Years: 10.00    Pack years: 2.50    Types: Cigarettes    Quit date: 09/26/1968    Years since quitting: 51.2  . Smokeless tobacco: Never Used  Substance Use Topics  . Alcohol use: Not Currently    Alcohol/week: 0.0 standard drinks  . Drug use: No    Home Medications Prior to Admission medications   Medication Sig Start Date End Date Taking? Authorizing Provider  acetaminophen (TYLENOL) 325 MG tablet Take 650 mg by mouth every 6 (six) hours as needed for moderate pain.    [provider]  amLODipine (NORVASC) 10 MG tablet Take 1 tablet (10 mg total) by mouth daily. 04/24/19   Hoyt Koch, MD  B Complex-Folic Acid (B COMPLEX FORMULA 1) TABS Take 1 tablet by mouth daily.    [provider]  calcitonin, salmon, (MIACALCIN/FORTICAL) 200 UNIT/ACT nasal spray PLACE 1 SPRAY INTO ALTERNATE NOSTRILS DAILY. 12/22/19   Hoyt Koch, MD  FLUoxetine (PROZAC) 20 MG capsule Take 1 capsule (20 mg total) by mouth daily. 04/21/19   [name], MD  hydrochlorothiazide (HYDRODIURIL) 25 MG tablet Take 1 tablet (25 mg total) by mouth daily. 04/21/19   [name], MD  memantine (NAMENDA) 10 MG tablet TAKE 1 TABLET EVERY NIGHT FOR 2 WEEKS, THEN INCREASE TO 1 TABLET TWICE A DAY 08/13/19   Cameron Sprang, MD  mirtazapine (REMERON) 15 MG tablet Take 1 tablet (15 mg total) by mouth at bedtime. 10/27/19   [name], MD  oxyCODONE (OXY IR/ROXICODONE) 5 MG immediate release tablet Take 1 tablet (5 mg total) by mouth every 4 (four) hours as needed for moderate pain. 06/03/19   Lorella Nimrod, MD  predniSONE (DELTASONE) 10 MG tablet TAKE 2 TABLETS (20 MG  TOTAL) BY MOUTH DAILY WITH BREAKFAST. 10/12/19   [name], MD  simvastatin (ZOCOR) 10 MG tablet Take 1 tablet (10 mg total) by mouth daily at 6 PM. 10/19/19   Hoyt Koch, MD  sodium chloride (OCEAN) 0.65 % SOLN nasal spray Place 1 spray into both nostrils as needed for congestion.    [provider]  ursodiol (ACTIGALL) 300 MG capsule Take 1 capsule (300 mg total) by mouth 2 (two) times daily. 04/03/19   [name], MD  lovastatin (MEVACOR) 20 MG tablet Please specify directions, refills and quantity 10/19/19 10/19/19  Hoyt Koch, MD  pravastatin (PRAVACHOL) 20 MG tablet Take 1 tablet (20 mg total) by mouth daily. 04/29/19 10/19/19  Hoyt Koch, MD    Allergies    Azathioprine, Entyvio [vedolizumab], Adhesive [tape], and Sulfa antibiotics  Review of Systems   Review of Systems  Unable to perform ROS: Dementia  Constitutional: Positive for fever.  Musculoskeletal: Positive for myalgias.    Physical Exam Updated Vital Signs BP (!) 183/105   Pulse 84   Temp 98.6 F (37 C) (Oral)   Resp 18   SpO2 97%   Physical Exam Vitals and nursing note reviewed.  Constitutional:      General: She is not in acute distress.    Appearance: Normal appearance. She is well-developed.  HENT:     Head: Normocephalic and atraumatic.  Eyes:     Conjunctiva/sclera: Conjunctivae normal.  Cardiovascular:     Rate and Rhythm: Normal rate and regular rhythm.     Heart sounds: No murmur.  Pulmonary:     Effort: Pulmonary effort is normal. No respiratory distress.     Breath sounds: Normal breath sounds.  Abdominal:     Palpations: Abdomen is soft.     Tenderness: There is no abdominal tenderness.  Musculoskeletal:        General: Normal range of motion.     Cervical back: Neck supple.     Right lower leg: Edema present.     Left lower leg: Edema present.  Skin:    General: Skin is warm and dry.     Capillary Refill: Capillary refill takes less than 2  seconds.     Findings: No rash.  Neurological:     General: No focal deficit present.     Mental Status: She is alert. Mental status is at baseline. She is disoriented.     ED Results / Procedures / Treatments   Labs (all labs ordered are listed, but only abnormal results are displayed) Labs Reviewed  CBC WITH DIFFERENTIAL/PLATELET - Abnormal; Notable for the following components:      Result Value   WBC 20.8 (*)    RBC 3.21 (*)    Hemoglobin 10.5 (*)    HCT 31.8 (*)    Platelets 422 (*)    Neutro Abs 19.9 (*)    Lymphs Abs 0.4 (*)    Abs Immature Granulocytes 0.24 (*)    All other components within normal limits  COMPREHENSIVE METABOLIC PANEL - Abnormal; Notable for the following components:   Potassium 2.9 (*)    Glucose, Bld 138 (*)    BUN 69 (*)    Creatinine, Ser 3.06 (*)    Calcium 7.9 (*)    Albumin 3.3 (*)    GFR calc non Af Amer 14 (*)    GFR calc Af Amer 16 (*)    All other components within normal limits  URINALYSIS, ROUTINE W REFLEX MICROSCOPIC - Abnormal; Notable for the following components:   Hgb urine dipstick LARGE (*)    Protein, ur 100 (*)    Bacteria, UA RARE (*)    All other components within normal limits  CBC - Abnormal; Notable for the following components:   WBC 15.5 (*)    RBC 2.85 (*)    Hemoglobin 9.3 (*)    HCT 28.5 (*)    All other components within normal limits  CREATININE, SERUM - Abnormal; Notable for the following  components:   Creatinine, Ser 2.99 (*)    GFR calc non Af Amer 14 (*)    GFR calc Af Amer 17 (*)    All other components within normal limits  COMPREHENSIVE METABOLIC PANEL - Abnormal; Notable for the following components:   BUN 60 (*)    Creatinine, Ser 2.76 (*)    Calcium 7.8 (*)    Total Protein 6.1 (*)    Albumin 3.0 (*)    AST 14 (*)    GFR calc non Af Amer 16 (*)    GFR calc Af Amer 18 (*)    All other components within normal limits  CBC - Abnormal; Notable for the following components:   WBC 13.1 (*)     RBC 2.76 (*)    Hemoglobin 9.0 (*)    HCT 27.5 (*)    All other components within normal limits  POC OCCULT BLOOD, ED - Abnormal; Notable for the following components:   Fecal Occult Bld POSITIVE (*)    All other components within normal limits  TROPONIN I (HIGH SENSITIVITY) - Abnormal; Notable for the following components:   Troponin I (High Sensitivity) 130 (*)    All other components within normal limits  TROPONIN I (HIGH SENSITIVITY) - Abnormal; Notable for the following components:   Troponin I (High Sensitivity) 126 (*)    All other components within normal limits  SARS CORONAVIRUS 2 BY RT PCR (HOSPITAL ORDER, [facility])  URINE CULTURE  CULTURE, BLOOD (ROUTINE X 2)  CULTURE, BLOOD (ROUTINE X 2)  PROTIME-INR  LACTIC ACID, PLASMA  LACTIC ACID, PLASMA  HIV ANTIBODY (ROUTINE TESTING W REFLEX)    EKG EKG Interpretation  Date/Time:  Saturday December 26 2019 13:34:28 EDT Ventricular Rate:  82 PR Interval:    QRS Duration: 104 QT Interval:  411 QTC Calculation: 480 R Axis:   -30 Text Interpretation: Sinus rhythm Left axis deviation RSR' in V1 or V2, probably normal variant Consider anterior infarct Nonspecific repol abnormality, lateral leads No significant change since prior 7/20 Confirmed by Aletta Edouard [phone]) on 12/26/2019 1:52:23 PM   Radiology CT Head Wo Contrast  Result Date: 12/26/2019 CLINICAL DATA:  Encephalopathy. Unable to ambulate today. Reduced oral intake. Urinary incontinence. Malodorous urine. EXAM: CT HEAD WITHOUT CONTRAST TECHNIQUE: Contiguous axial images were obtained from the base of the skull through the vertex without intravenous contrast. COMPARISON:  08/31/2009 FINDINGS: Brain: The brainstem, cerebellum, cerebral peduncles, thalami, basal ganglia, basilar cisterns, and ventricular system appear within normal limits. No intracranial hemorrhage, mass lesion, or acute CVA. Periventricular white matter and corona radiata  hypodensities favor chronic ischemic microvascular white matter disease. Vascular: Unremarkable Skull: Unremarkable Sinuses/Orbits: Unremarkable Other: No supplemental non-categorized findings. IMPRESSION: 1. No acute intracranial findings. 2. Periventricular white matter and corona radiata hypodensities favor chronic ischemic microvascular white matter disease. Electronically Signed   By: Van Clines M.D.   On: 12/26/2019 15:48   CT Chest Wo Contrast  Result Date: 12/26/2019 CLINICAL DATA:  Right upper lung opacity chest radiography for further characterization. Left lung opacities also noted. Altered mental status. EXAM: CT CHEST, ABDOMEN AND PELVIS WITHOUT CONTRAST TECHNIQUE: Multidetector CT imaging of the chest, abdomen and pelvis was performed following the standard protocol without IV contrast. COMPARISON:  Multiple exams, including chest radiograph from 12/26/2019 and CT abdomen from 01/27/2019 FINDINGS: CT CHEST FINDINGS Cardiovascular: Coronary, aortic arch, and branch vessel atherosclerotic vascular disease. Mild cardiomegaly. Mediastinum/Nodes: Unremarkable Lungs/Pleura: Scattered scarring in the lungs. Confluent ground-glass  opacities inferiorly in the right upper lobe and in the right middle lobe. Less degree of hazy ground-glass opacity posteriorly in the left upper lobe. Old granulomatous disease. Small left and trace right pleural effusion. Musculoskeletal: Small ossicles in the bicipital groove likely represent free osteochondral fragments in the right biceps recess of the glenohumeral joint. Similar findings on the left. Prior vertebral augmentations at T7 and T9. Age indeterminate endplate compression fractures at T8, T11, T12, and L1. CT ABDOMEN PELVIS FINDINGS Hepatobiliary: Mild lobularity of the a hepatic contour could reflect early cirrhosis. Cholecystectomy. No appreciable significant degree of biliary dilatation. Pancreas: Unremarkable Spleen: Unremarkable Adrenals/Urinary Tract:  Both adrenal glands appear normal. Low-density lobularity from the left kidney upper pole, probably a cyst although technically nonspecific, measuring about 3.1 by 1.5 cm, previously measuring about 2.3 by 1.4 cm on 01/27/2019. No urinary tract calculi, hydronephrosis, or hydroureter. Urinary bladder unremarkable, without wall thickening. Stomach/Bowel: Periampullary duodenal diverticulum. Descending and sigmoid colon diverticulosis. There is some stranding along the margin of the proximal sigmoid colon on image 87/4 suggesting mild acute diverticulitis. Vascular/Lymphatic: Aortoiliac atherosclerotic vascular disease. Reproductive: Uterus absent.  Adnexa unremarkable. Other: Trace free pelvic fluid, etiology uncertain. Musculoskeletal: Postoperative findings in the lower lumbar spine. Grade 1 degenerative anterolisthesis at L4-5. Probably late subacute superior endplate compression fractures at L1, L2, and L4 (the L1 and L2 compressions were not present on 01/27/2019 and the L4 compression of the superior endplate was less striking on that exam). Posterior decompression at L4-L5-S1. IMPRESSION: 1. Confluent ground-glass opacities inferiorly in the right upper lobe and in the right middle lobe, with lesser degree of hazy ground-glass opacity posteriorly in the left upper lobe. Appearance favors atypical pneumonia over asymmetric edema. 2. Descending and sigmoid colon diverticulosis with mild proximal sigmoid colon diverticulitis. No extraluminal gas or abscess. 3. Small left and trace right pleural effusions. 4. Other imaging findings of potential clinical significance: Coronary atherosclerosis with mild cardiomegaly. Mild lobularity of the a hepatic contour could reflect early cirrhosis. Periampullary duodenal diverticulum. Trace free pelvic fluid, etiology uncertain. Descending and sigmoid colon diverticulosis. Multilevel compression fractures in the spine including T7, T8, T9, T11, T12, L1, L2, and L4. Posterior  decompression at L4-L5-S1. Small free osteochondral fragments in the glenohumeral joints. Cardiomegaly. 5. Aortic atherosclerosis. Aortic Atherosclerosis (ICD10-I70.0). Electronically Signed   By: Van Clines M.D.   On: 12/26/2019 16:01   DG Chest Port 1 View  Result Date: 12/26/2019 CLINICAL DATA:  Generalized weakness EXAM: PORTABLE CHEST 1 VIEW COMPARISON:  Chest radiograph 01/27/2019 FINDINGS: Monitoring leads overlie the patient. Stable enlarged cardiac and mediastinal contours. Heterogeneous opacities left lung base. Possible small left pleural effusion. No pneumothorax. Irregular opacity within the right upper lung. IMPRESSION: 1. Heterogeneous opacities left lung base may represent atelectasis or infection. 2. Irregular opacity within the right upper lung is nonspecific. Underlying pulmonary nodule/mass is not excluded. This needs dedicated evaluation with chest CT. Electronically Signed   By: Lovey Newcomer M.D.   On: 12/26/2019 14:52   CT Renal Stone Study  Result Date: 12/26/2019 CLINICAL DATA:  Right upper lung opacity chest radiography for further characterization. Left lung opacities also noted. Altered mental status. EXAM: CT CHEST, ABDOMEN AND PELVIS WITHOUT CONTRAST TECHNIQUE: Multidetector CT imaging of the chest, abdomen and pelvis was performed following the standard protocol without IV contrast. COMPARISON:  Multiple exams, including chest radiograph from 12/26/2019 and CT abdomen from 01/27/2019 FINDINGS: CT CHEST FINDINGS Cardiovascular: Coronary, aortic arch, and branch vessel atherosclerotic vascular disease. Mild cardiomegaly.  Mediastinum/Nodes: Unremarkable Lungs/Pleura: Scattered scarring in the lungs. Confluent ground-glass opacities inferiorly in the right upper lobe and in the right middle lobe. Less degree of hazy ground-glass opacity posteriorly in the left upper lobe. Old granulomatous disease. Small left and trace right pleural effusion. Musculoskeletal: Small ossicles in  the bicipital groove likely represent free osteochondral fragments in the right biceps recess of the glenohumeral joint. Similar findings on the left. Prior vertebral augmentations at T7 and T9. Age indeterminate endplate compression fractures at T8, T11, T12, and L1. CT ABDOMEN PELVIS FINDINGS Hepatobiliary: Mild lobularity of the a hepatic contour could reflect early cirrhosis. Cholecystectomy. No appreciable significant degree of biliary dilatation. Pancreas: Unremarkable Spleen: Unremarkable Adrenals/Urinary Tract: Both adrenal glands appear normal. Low-density lobularity from the left kidney upper pole, probably a cyst although technically nonspecific, measuring about 3.1 by 1.5 cm, previously measuring about 2.3 by 1.4 cm on 01/27/2019. No urinary tract calculi, hydronephrosis, or hydroureter. Urinary bladder unremarkable, without wall thickening. Stomach/Bowel: Periampullary duodenal diverticulum. Descending and sigmoid colon diverticulosis. There is some stranding along the margin of the proximal sigmoid colon on image 87/4 suggesting mild acute diverticulitis. Vascular/Lymphatic: Aortoiliac atherosclerotic vascular disease. Reproductive: Uterus absent.  Adnexa unremarkable. Other: Trace free pelvic fluid, etiology uncertain. Musculoskeletal: Postoperative findings in the lower lumbar spine. Grade 1 degenerative anterolisthesis at L4-5. Probably late subacute superior endplate compression fractures at L1, L2, and L4 (the L1 and L2 compressions were not present on 01/27/2019 and the L4 compression of the superior endplate was less striking on that exam). Posterior decompression at L4-L5-S1. IMPRESSION: 1. Confluent ground-glass opacities inferiorly in the right upper lobe and in the right middle lobe, with lesser degree of hazy ground-glass opacity posteriorly in the left upper lobe. Appearance favors atypical pneumonia over asymmetric edema. 2. Descending and sigmoid colon diverticulosis with mild proximal  sigmoid colon diverticulitis. No extraluminal gas or abscess. 3. Small left and trace right pleural effusions. 4. Other imaging findings of potential clinical significance: Coronary atherosclerosis with mild cardiomegaly. Mild lobularity of the a hepatic contour could reflect early cirrhosis. Periampullary duodenal diverticulum. Trace free pelvic fluid, etiology uncertain. Descending and sigmoid colon diverticulosis. Multilevel compression fractures in the spine including T7, T8, T9, T11, T12, L1, L2, and L4. Posterior decompression at L4-L5-S1. Small free osteochondral fragments in the glenohumeral joints. Cardiomegaly. 5. Aortic atherosclerosis. Aortic Atherosclerosis (ICD10-I70.0). Electronically Signed   By: Van Clines M.D.   On: 12/26/2019 16:01    Procedures .Critical Care Performed by: Hayden Rasmussen, MD Authorized by: Hayden Rasmussen, MD   Critical care provider statement:    Critical care time (minutes):  45   Critical care time was exclusive of:  Separately billable procedures and treating other patients   Critical care was necessary to treat or prevent imminent or life-threatening deterioration of the following conditions:  CNS failure or compromise, metabolic crisis and sepsis   Critical care was time spent personally by me on the following activities:  Discussions with consultants, evaluation of patient's response to treatment, examination of patient, ordering and performing treatments and interventions, ordering and review of laboratory studies, ordering and review of radiographic studies, pulse oximetry, re-evaluation of patient's condition, obtaining history from patient or surrogate, review of old charts and development of treatment plan with patient or surrogate   I assumed direction of critical care for this patient from another provider in my specialty: no     (including critical care time)  Medications Ordered in ED Medications  acetaminophen (TYLENOL) tablet 650  mg (has no administration in time range)    Or  acetaminophen (TYLENOL) suppository 650 mg (has no administration in time range)  senna-docusate (Senokot-S) tablet 1 tablet (has no administration in time range)  ondansetron (ZOFRAN) tablet 4 mg (has no administration in time range)    Or  ondansetron (ZOFRAN) injection 4 mg (has no administration in time range)  cefTRIAXone (ROCEPHIN) 2 g in sodium chloride 0.9 % 100 mL IVPB (has no administration in time range)  azithromycin (ZITHROMAX) 500 mg in sodium chloride 0.9 % 250 mL IVPB (has no administration in time range)  metroNIDAZOLE (FLAGYL) IVPB 500 mg (500 mg Intravenous New Bag/Given 12/27/19 0525)  ursodiol (ACTIGALL) capsule 300 mg (300 mg Oral Given 12/26/19 2305)  FLUoxetine (PROZAC) capsule 20 mg (has no administration in time range)  amLODipine (NORVASC) tablet 10 mg (has no administration in time range)  B-complex with vitamin C tablet 1 tablet (has no administration in time range)  oxyCODONE (Oxy IR/ROXICODONE) immediate release tablet 5 mg (5 mg Oral Given 12/27/19 0605)  sodium chloride (OCEAN) 0.65 % nasal spray 1 spray (has no administration in time range)  memantine (NAMENDA) tablet 10 mg (10 mg Oral Given 12/26/19 2305)  predniSONE (DELTASONE) tablet 20 mg (20 mg Oral Given 12/27/19 0701)  simvastatin (ZOCOR) tablet 10 mg (has no administration in time range)  mirtazapine (REMERON) tablet 15 mg (15 mg Oral Given 12/26/19 2121)  calcitonin (salmon) (MIACALCIN/FORTICAL) nasal spray 1 spray (0 sprays Alternating Nares Hold 12/26/19 2124)  pantoprazole (PROTONIX) injection 40 mg (40 mg Intravenous Given 12/26/19 2120)  potassium chloride SA (KLOR-CON) CR tablet 40 mEq (40 mEq Oral Given 12/26/19 2120)  lactated ringers 1,000 mL with potassium chloride 20 mEq infusion ( Intravenous New Bag/Given 12/26/19 2306)  sodium chloride 0.9 % bolus 1,000 mL (0 mLs Intravenous Stopped 12/26/19 1851)  cefTRIAXone (ROCEPHIN) 1 g in sodium chloride 0.9 % 100 mL  IVPB (0 g Intravenous Stopped 12/26/19 1710)  azithromycin (ZITHROMAX) 500 mg in sodium chloride 0.9 % 250 mL IVPB (0 mg Intravenous Stopped 12/26/19 1851)  potassium chloride 10 mEq in 100 mL IVPB (0 mEq Intravenous Stopped 12/26/19 1852)  metroNIDAZOLE (FLAGYL) IVPB 500 mg (0 mg Intravenous Stopped 12/26/19 1853)    ED Course  I have reviewed the triage vital signs and the nursing notes.  Pertinent labs & imaging results that were available during my care of the patient were reviewed by me and considered in my medical decision making (see chart for details).  Clinical Course as of Dec 26 933  Sat Dec 26, 2019  1425 Chest x-ray interpreted by me as cardiomegaly no gross infiltrates.   [MB]  5726 Rectal exam done with nurse Altha Harm as chaperone.  Normal tone minimal stool in vault.  Sample sent for guaiac to lab.   [MB]    Clinical Course User Index [MB] Hayden Rasmussen, MD   MDM Rules/Calculators/A&P                     This patient complains of weakness poor p.o. intake fever; this involves an extensive number of treatment Options and is a complaint that carries with it a high risk of complications and Morbidity. The differential includes sepsis, Sirs, pneumonia, UTI, intra-abdominal infection, stroke metabolic derangement, ACS  I ordered, reviewed and interpreted labs, which included CBC showing elevated white count, hemoglobin down from priors.  Chemistries markedly abnormal with new AKI.  Troponins elevated but delta troponin not rising.  Fecal  occult positive.  Urinalysis with 21-50 reds but no white cells. I ordered medication IV fluids IV antibiotics I ordered imaging studies which included chest x-ray, CT head chest abdomen pelvis and I independently    visualized and interpreted imaging which showed signs of pneumonia in the chest, possible diverticulitis in the abdomen. Additional history obtained from patient's sister Previous records obtained and reviewed in epic I  consulted [facility] hospitalist Dr. Avon Gully and discussed lab and imaging findings  Critical Interventions: Identification of and management of her metabolic derangement, infectious sources  After the interventions stated above, I reevaluated the patient and found patient to remain afebrile here with good blood pressure.  Will need admission for management of her hypokalemia, pneumonia, diverticulitis, elevated troponin, with concerns of her being on chronic steroids with infectious she would be at high risk for sepsis.   Final Clinical Impression(s) / ED Diagnoses Final diagnoses:  Acute metabolic encephalopathy  Community acquired pneumonia, unspecified laterality  Acute diverticulitis  Guaiac positive stools  Hypokalemia  AKI (acute kidney injury) ([place])    Rx / DC Orders ED Discharge Orders    None       Hayden Rasmussen, MD 12/27/19 [id number]

## 2019-12-26 NOTE — ED Triage Notes (Signed)
79 yo female from home with generalized weakness for the past 2 days. EMS noted pt to be febrile at 101.2 today. Pt ambulates at baseline and is A/O to name. Unable to ambulate today. Decreased PO intake. Pt also incontinent of urine with foul smell per EMS. +edema to feet also noted.   Hx of Crohn's, HTN, GERD.   Pt received Zofran 82m ODT and Ibuprofen 8084mPO en route by EMS.   EMS vitals  160/100 HR 96 Sat 96%RA CBG=143  Son StDorothyann Pengould like to be called with updates [phone]

## 2019-12-27 ENCOUNTER — Encounter (HOSPITAL_COMMUNITY): Payer: Self-pay | Admitting: Internal Medicine

## 2019-12-27 DIAGNOSIS — I10 Essential (primary) hypertension: Secondary | ICD-10-CM

## 2019-12-27 DIAGNOSIS — G309 Alzheimer's disease, unspecified: Secondary | ICD-10-CM

## 2019-12-27 DIAGNOSIS — A419 Sepsis, unspecified organism: Principal | ICD-10-CM

## 2019-12-27 DIAGNOSIS — D72829 Elevated white blood cell count, unspecified: Secondary | ICD-10-CM

## 2019-12-27 DIAGNOSIS — N1832 Chronic kidney disease, stage 3b: Secondary | ICD-10-CM

## 2019-12-27 DIAGNOSIS — K5792 Diverticulitis of intestine, part unspecified, without perforation or abscess without bleeding: Secondary | ICD-10-CM

## 2019-12-27 DIAGNOSIS — N179 Acute kidney failure, unspecified: Secondary | ICD-10-CM

## 2019-12-27 DIAGNOSIS — F0280 Dementia in other diseases classified elsewhere without behavioral disturbance: Secondary | ICD-10-CM

## 2019-12-27 DIAGNOSIS — J189 Pneumonia, unspecified organism: Secondary | ICD-10-CM

## 2019-12-27 LAB — URINE CULTURE: Culture: NO GROWTH

## 2019-12-27 LAB — COMPREHENSIVE METABOLIC PANEL
ALT: 10 U/L (ref 0–44)
AST: 14 U/L — ABNORMAL LOW (ref 15–41)
Albumin: 3 g/dL — ABNORMAL LOW (ref 3.5–5.0)
Alkaline Phosphatase: 55 U/L (ref 38–126)
Anion gap: 10 (ref 5–15)
BUN: 60 mg/dL — ABNORMAL HIGH (ref 8–23)
CO2: 22 mmol/L (ref 22–32)
Calcium: 7.8 mg/dL — ABNORMAL LOW (ref 8.9–10.3)
Chloride: 110 mmol/L (ref 98–111)
Creatinine, Ser: 2.76 mg/dL — ABNORMAL HIGH (ref 0.44–1.00)
GFR calc Af Amer: 18 mL/min — ABNORMAL LOW (ref >60)
GFR calc non Af Amer: 16 mL/min — ABNORMAL LOW (ref >60)
Glucose, Bld: 79 mg/dL (ref 70–99)
Potassium: 3.7 mmol/L (ref 3.5–5.1)
Sodium: 142 mmol/L (ref 135–145)
Total Bilirubin: 0.4 mg/dL (ref 0.3–1.2)
Total Protein: 6.1 g/dL — ABNORMAL LOW (ref 6.5–8.1)

## 2019-12-27 LAB — CBC
HCT: 27.5 % — ABNORMAL LOW (ref 36.0–46.0)
Hemoglobin: 9 g/dL — ABNORMAL LOW (ref 12.0–15.0)
MCH: 32.6 pg (ref 26.0–34.0)
MCHC: 32.7 g/dL (ref 30.0–36.0)
MCV: 99.6 fL (ref 80.0–100.0)
Platelets: 359 10*3/uL (ref 150–400)
RBC: 2.76 MIL/uL — ABNORMAL LOW (ref 3.87–5.11)
RDW: 12.8 % (ref 11.5–15.5)
WBC: 13.1 10*3/uL — ABNORMAL HIGH (ref 4.0–10.5)
nRBC: 0 % (ref 0.0–0.2)

## 2019-12-27 LAB — HIV ANTIBODY (ROUTINE TESTING W REFLEX): HIV Screen 4th Generation wRfx: NONREACTIVE

## 2019-12-27 MED ORDER — METOPROLOL TARTRATE 5 MG/5ML IV SOLN
2.5000 mg | Freq: Four times a day (QID) | INTRAVENOUS | Status: DC | PRN
  Administered 2019-12-28: 2.5 mg via INTRAVENOUS
  Filled 2019-12-27 (×2): qty 5

## 2019-12-27 MED ORDER — SODIUM CHLORIDE 0.9 % IV SOLN
2.0000 g | INTRAVENOUS | Status: DC
  Filled 2019-12-27: qty 20

## 2019-12-27 NOTE — Progress Notes (Addendum)
Patient ID: [place], female   DOB: 05/28/1941, 79 y.o.   MRN: [id number]  PROGRESS NOTE    [name] [name]  MLY:[id number] DOB: 08-13-1940 DOA: 12/26/2019 PCP: Hoyt Koch, MD   Brief Narrative:  79 year old female with history of advanced dementia, Crohn's colitis, GERD, hypertension, chronic kidney disease stage III, unspecified depression, arthritis presented with generalized weakness, poor oral intake and pain all over.  In route, EMS noted fever of 101.2.  In the ED, patient had leukocytosis with slightly elevated creatinine from baseline along with positive fecal occult blood.  Imaging was notable for possible sigmoid diverticulitis along with pneumonia.  She was started on intravenous antibiotics.  Assessment & Plan:   Acute metabolic encephalopathy in a patient with advanced dementia -Probably multifactorial secondary to sepsis from pneumonia/colitis -Monitor mental status.  Fall precaution.  Delirium precautions.  Mental status is probably back to baseline.  Sepsis: Present on admission Community-acquired multilobar pneumonia with concern for aspiration as well Probable sigmoid diverticulitis -Currently on broad-spectrum antibiotics Rocephin and Zithromax along with Flagyl -Follow cultures. Currently hemodynamically stable. -Continue IV fluids  Leukocytosis -Improving.  Monitor  History of Crohn's disease -Patient is on chronic steroids.  Continue.  No signs of flare on CT.  Outpatient follow-up with GI.  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  Creatinine 2.76 this morning.  Continue IV fluids.  Multilevel compression fractures in the thoracolumbar spine -Probably chronic.  Fall precautions.  Pain management.  PT eval.  Anemia of chronic disease -From chronic kidney disease.  Hemoglobin stable.  Monitor -Fecal occult blood test is positive but there is no evidence of overt GI bleeding.  Doubt that patient has GI  bleed.  Advanced dementia Depression -Fall precautions. -Delirium precautions.  Monitor mental status -Continue Prozac, Namenda, Remeron  Minimally elevated troponin -Troponins did not trend up.  EKG without ST depression or elevation.  No further work-up needed.  Outpatient follow-up  Hypertension  -Monitor blood pressure.  Continue amlodipine.  Hypokalemia -Improving.  Monitor  GERD -Continue PPI  Generalized deconditioning -PT eval   DVT prophylaxis: SCDs.  Holding chemical prophylaxis because of positive occult blood and anemia Code Status: DNR.  Confirmed by son/Stanley on phone on 12/27/2019 Family Communication: Spoke to son/family on phone on 12/27/2019 Disposition Plan: Status is: Inpatient  Remains inpatient appropriate because:IV treatments appropriate due to intensity of illness or inability to take PO.  Currently on IV antibiotics with significant lower abdominal pain and acute kidney injury.   Dispo: The patient is from: Home              Anticipated d/c is to: Home              Anticipated d/c date is: 3 days              Patient currently is not medically stable to d/c.    Consultants: None  Procedures: None  Antimicrobials: Rocephin, Zithromax and Flagyl from 12/26/2019 onwards   Subjective: Patient seen and examined at bedside.  She is awake but confused to time.  Does not feel well.  Complains of lower abdominal pain.  No overnight fever or vomiting reported.  Objective: Vitals:   12/27/19 0500 12/27/19 0530 12/27/19 0700 12/27/19 0700  BP: (!) 164/93 (!) 167/92 (!) 157/95 (!) 157/95  Pulse: 81 82 83 85  Resp: 15 15  16   Temp:      TempSrc:      SpO2:  93% 93% (!) 89% 90%    Intake/Output Summary (Last 24 hours) at 12/27/2019 1130 Last data filed at 12/26/2019 1853 Gross per 24 hour  Intake 1710 ml  Output 400 ml  Net 1310 ml   There were no vitals filed for this visit.  Examination:  General exam: Appears calm and comfortable.  Looks  chronically ill. Respiratory system: Bilateral decreased breath sounds at bases with some scattered crackles Cardiovascular system: S1 & S2 heard, Rate controlled Gastrointestinal system: Abdomen is nondistended, soft and tender in the lower quadrant. Normal bowel sounds heard. Extremities: No cyanosis, clubbing; trace lower extremity edema present Central nervous system: Awake but confused to time.  Poor historian. No focal neurological deficits. Moving extremities Skin: No rashes, lesions or ulcers Psychiatry: Could not be assessed because of mental status  Data Reviewed: I have personally reviewed following labs and imaging studies  CBC: Recent Labs  Lab 12/26/19 1324 12/26/19 1908 12/27/19 0406  WBC 20.8* 15.5* 13.1*  NEUTROABS 19.9*  --   --   HGB 10.5* 9.3* 9.0*  HCT 31.8* 28.5* 27.5*  MCV 99.1 100.0 99.6  PLT 422* 364 761   Basic Metabolic Panel: Recent Labs  Lab 12/26/19 1324 12/26/19 1908 12/27/19 0406  NA 141  --  142  K 2.9*  --  3.7  CL 104  --  110  CO2 23  --  22  GLUCOSE 138*  --  79  BUN 69*  --  60*  CREATININE 3.06* 2.99* 2.76*  CALCIUM 7.9*  --  7.8*   GFR: CrCl cannot be calculated (Unknown ideal weight.). Liver Function Tests: Recent Labs  Lab 12/26/19 1324 12/27/19 0406  AST 17 14*  ALT 11 10  ALKPHOS 65 55  BILITOT 0.7 0.4  PROT 6.9 6.1*  ALBUMIN 3.3* 3.0*   No results for input(s): LIPASE, AMYLASE in the last 168 hours. No results for input(s): AMMONIA in the last 168 hours. Coagulation Profile: Recent Labs  Lab 12/26/19 1324  INR 0.9   Cardiac Enzymes: No results for input(s): CKTOTAL, CKMB, CKMBINDEX, TROPONINI in the last 168 hours. BNP (last 3 results) No results for input(s): PROBNP in the last 8760 hours. HbA1C: No results for input(s): HGBA1C in the last 72 hours. CBG: No results for input(s): GLUCAP in the last 168 hours. Lipid Profile: No results for input(s): CHOL, HDL, LDLCALC, TRIG, CHOLHDL, LDLDIRECT in the  last 72 hours. Thyroid Function Tests: No results for input(s): TSH, T4TOTAL, FREET4, T3FREE, THYROIDAB in the last 72 hours. Anemia Panel: No results for input(s): VITAMINB12, FOLATE, FERRITIN, TIBC, IRON, RETICCTPCT in the last 72 hours. Sepsis Labs: Recent Labs  Lab 12/26/19 1324 12/26/19 1548  LATICACIDVEN 1.1 1.0    Recent Results (from the past 240 hour(s))  SARS Coronavirus 2 by RT PCR (hospital order, performed in [facility] hospital lab) Nasopharyngeal Nasopharyngeal Swab     Status: None   Collection Time: 12/26/19  1:28 PM   Specimen: Nasopharyngeal Swab  Result Value Ref Range Status   SARS Coronavirus 2 NEGATIVE NEGATIVE Final    Comment: (NOTE) SARS-CoV-2 target nucleic acids are NOT DETECTED. The SARS-CoV-2 RNA is generally detectable in upper and lower respiratory specimens during the acute phase of infection. The lowest concentration of SARS-CoV-2 viral copies this assay can detect is 250 copies / mL. A negative result does not preclude SARS-CoV-2 infection and should not be used as the sole basis for treatment or other patient management decisions.  A negative result may  occur with improper specimen collection / handling, submission of specimen other than nasopharyngeal swab, presence of viral mutation(s) within the areas targeted by this assay, and inadequate number of viral copies (<250 copies / mL). A negative result must be combined with clinical observations, patient history, and epidemiological information. Fact Sheet for Patients:   StrictlyIdeas.no Fact Sheet for Healthcare Providers: BankingDealers.co.za This test is not yet approved or cleared  by the [place] FDA and has been authorized for detection and/or diagnosis of SARS-CoV-2 by FDA under an Emergency Use Authorization (EUA).  This EUA will remain in effect (meaning this test can be used) for the duration of the COVID-19 declaration under  Section 564(b)(1) of the Act, 21 U.S.C. section 360bbb-3(b)(1), unless the authorization is terminated or revoked sooner. Performed at [facility], [place] [address], [place], [place] [address]          Radiology Studies: CT Head Wo Contrast  Result Date: 12/26/2019 CLINICAL DATA:  Encephalopathy. Unable to ambulate today. Reduced oral intake. Urinary incontinence. Malodorous urine. EXAM: CT HEAD WITHOUT CONTRAST TECHNIQUE: Contiguous axial images were obtained from the base of the skull through the vertex without intravenous contrast. COMPARISON:  08/31/2009 FINDINGS: Brain: The brainstem, cerebellum, cerebral peduncles, thalami, basal ganglia, basilar cisterns, and ventricular system appear within normal limits. No intracranial hemorrhage, mass lesion, or acute CVA. Periventricular white matter and corona radiata hypodensities favor chronic ischemic microvascular white matter disease. Vascular: Unremarkable Skull: Unremarkable Sinuses/Orbits: Unremarkable Other: No supplemental non-categorized findings. IMPRESSION: 1. No acute intracranial findings. 2. Periventricular white matter and corona radiata hypodensities favor chronic ischemic microvascular white matter disease. Electronically Signed   By: Van Clines M.D.   On: 12/26/2019 15:48   CT Chest Wo Contrast  Result Date: 12/26/2019 CLINICAL DATA:  Right upper lung opacity chest radiography for further characterization. Left lung opacities also noted. Altered mental status. EXAM: CT CHEST, ABDOMEN AND PELVIS WITHOUT CONTRAST TECHNIQUE: Multidetector CT imaging of the chest, abdomen and pelvis was performed following the standard protocol without IV contrast. COMPARISON:  Multiple exams, including chest radiograph from 12/26/2019 and CT abdomen from 01/27/2019 FINDINGS: CT CHEST FINDINGS Cardiovascular: Coronary, aortic arch, and branch vessel atherosclerotic vascular disease. Mild cardiomegaly. Mediastinum/Nodes:  Unremarkable Lungs/Pleura: Scattered scarring in the lungs. Confluent ground-glass opacities inferiorly in the right upper lobe and in the right middle lobe. Less degree of hazy ground-glass opacity posteriorly in the left upper lobe. Old granulomatous disease. Small left and trace right pleural effusion. Musculoskeletal: Small ossicles in the bicipital groove likely represent free osteochondral fragments in the right biceps recess of the glenohumeral joint. Similar findings on the left. Prior vertebral augmentations at T7 and T9. Age indeterminate endplate compression fractures at T8, T11, T12, and L1. CT ABDOMEN PELVIS FINDINGS Hepatobiliary: Mild lobularity of the a hepatic contour could reflect early cirrhosis. Cholecystectomy. No appreciable significant degree of biliary dilatation. Pancreas: Unremarkable Spleen: Unremarkable Adrenals/Urinary Tract: Both adrenal glands appear normal. Low-density lobularity from the left kidney upper pole, probably a cyst although technically nonspecific, measuring about 3.1 by 1.5 cm, previously measuring about 2.3 by 1.4 cm on 01/27/2019. No urinary tract calculi, hydronephrosis, or hydroureter. Urinary bladder unremarkable, without wall thickening. Stomach/Bowel: Periampullary duodenal diverticulum. Descending and sigmoid colon diverticulosis. There is some stranding along the margin of the proximal sigmoid colon on image 87/4 suggesting mild acute diverticulitis. Vascular/Lymphatic: Aortoiliac atherosclerotic vascular disease. Reproductive: Uterus absent.  Adnexa unremarkable. Other: Trace free pelvic fluid, etiology uncertain. Musculoskeletal: Postoperative findings in the lower lumbar  spine. Grade 1 degenerative anterolisthesis at L4-5. Probably late subacute superior endplate compression fractures at L1, L2, and L4 (the L1 and L2 compressions were not present on 01/27/2019 and the L4 compression of the superior endplate was less striking on that exam). Posterior  decompression at L4-L5-S1. IMPRESSION: 1. Confluent ground-glass opacities inferiorly in the right upper lobe and in the right middle lobe, with lesser degree of hazy ground-glass opacity posteriorly in the left upper lobe. Appearance favors atypical pneumonia over asymmetric edema. 2. Descending and sigmoid colon diverticulosis with mild proximal sigmoid colon diverticulitis. No extraluminal gas or abscess. 3. Small left and trace right pleural effusions. 4. Other imaging findings of potential clinical significance: Coronary atherosclerosis with mild cardiomegaly. Mild lobularity of the a hepatic contour could reflect early cirrhosis. Periampullary duodenal diverticulum. Trace free pelvic fluid, etiology uncertain. Descending and sigmoid colon diverticulosis. Multilevel compression fractures in the spine including T7, T8, T9, T11, T12, L1, L2, and L4. Posterior decompression at L4-L5-S1. Small free osteochondral fragments in the glenohumeral joints. Cardiomegaly. 5. Aortic atherosclerosis. Aortic Atherosclerosis (ICD10-I70.0). Electronically Signed   By: Van Clines M.D.   On: 12/26/2019 16:01   DG Chest Port 1 View  Result Date: 12/26/2019 CLINICAL DATA:  Generalized weakness EXAM: PORTABLE CHEST 1 VIEW COMPARISON:  Chest radiograph 01/27/2019 FINDINGS: Monitoring leads overlie the patient. Stable enlarged cardiac and mediastinal contours. Heterogeneous opacities left lung base. Possible small left pleural effusion. No pneumothorax. Irregular opacity within the right upper lung. IMPRESSION: 1. Heterogeneous opacities left lung base may represent atelectasis or infection. 2. Irregular opacity within the right upper lung is nonspecific. Underlying pulmonary nodule/mass is not excluded. This needs dedicated evaluation with chest CT. Electronically Signed   By: Lovey Newcomer M.D.   On: 12/26/2019 14:52   CT Renal Stone Study  Result Date: 12/26/2019 CLINICAL DATA:  Right upper lung opacity chest radiography  for further characterization. Left lung opacities also noted. Altered mental status. EXAM: CT CHEST, ABDOMEN AND PELVIS WITHOUT CONTRAST TECHNIQUE: Multidetector CT imaging of the chest, abdomen and pelvis was performed following the standard protocol without IV contrast. COMPARISON:  Multiple exams, including chest radiograph from 12/26/2019 and CT abdomen from 01/27/2019 FINDINGS: CT CHEST FINDINGS Cardiovascular: Coronary, aortic arch, and branch vessel atherosclerotic vascular disease. Mild cardiomegaly. Mediastinum/Nodes: Unremarkable Lungs/Pleura: Scattered scarring in the lungs. Confluent ground-glass opacities inferiorly in the right upper lobe and in the right middle lobe. Less degree of hazy ground-glass opacity posteriorly in the left upper lobe. Old granulomatous disease. Small left and trace right pleural effusion. Musculoskeletal: Small ossicles in the bicipital groove likely represent free osteochondral fragments in the right biceps recess of the glenohumeral joint. Similar findings on the left. Prior vertebral augmentations at T7 and T9. Age indeterminate endplate compression fractures at T8, T11, T12, and L1. CT ABDOMEN PELVIS FINDINGS Hepatobiliary: Mild lobularity of the a hepatic contour could reflect early cirrhosis. Cholecystectomy. No appreciable significant degree of biliary dilatation. Pancreas: Unremarkable Spleen: Unremarkable Adrenals/Urinary Tract: Both adrenal glands appear normal. Low-density lobularity from the left kidney upper pole, probably a cyst although technically nonspecific, measuring about 3.1 by 1.5 cm, previously measuring about 2.3 by 1.4 cm on 01/27/2019. No urinary tract calculi, hydronephrosis, or hydroureter. Urinary bladder unremarkable, without wall thickening. Stomach/Bowel: Periampullary duodenal diverticulum. Descending and sigmoid colon diverticulosis. There is some stranding along the margin of the proximal sigmoid colon on image 87/4 suggesting mild acute  diverticulitis. Vascular/Lymphatic: Aortoiliac atherosclerotic vascular disease. Reproductive: Uterus absent.  Adnexa unremarkable. Other: Trace free  pelvic fluid, etiology uncertain. Musculoskeletal: Postoperative findings in the lower lumbar spine. Grade 1 degenerative anterolisthesis at L4-5. Probably late subacute superior endplate compression fractures at L1, L2, and L4 (the L1 and L2 compressions were not present on 01/27/2019 and the L4 compression of the superior endplate was less striking on that exam). Posterior decompression at L4-L5-S1. IMPRESSION: 1. Confluent ground-glass opacities inferiorly in the right upper lobe and in the right middle lobe, with lesser degree of hazy ground-glass opacity posteriorly in the left upper lobe. Appearance favors atypical pneumonia over asymmetric edema. 2. Descending and sigmoid colon diverticulosis with mild proximal sigmoid colon diverticulitis. No extraluminal gas or abscess. 3. Small left and trace right pleural effusions. 4. Other imaging findings of potential clinical significance: Coronary atherosclerosis with mild cardiomegaly. Mild lobularity of the a hepatic contour could reflect early cirrhosis. Periampullary duodenal diverticulum. Trace free pelvic fluid, etiology uncertain. Descending and sigmoid colon diverticulosis. Multilevel compression fractures in the spine including T7, T8, T9, T11, T12, L1, L2, and L4. Posterior decompression at L4-L5-S1. Small free osteochondral fragments in the glenohumeral joints. Cardiomegaly. 5. Aortic atherosclerosis. Aortic Atherosclerosis (ICD10-I70.0). Electronically Signed   By: Van Clines M.D.   On: 12/26/2019 16:01        Scheduled Meds: . amLODipine  10 mg Oral Daily  . B-complex with vitamin C  1 tablet Oral Daily  . calcitonin (salmon)  1 spray Alternating Nares Daily  . FLUoxetine  20 mg Oral Daily  . memantine  10 mg Oral BID  . mirtazapine  15 mg Oral QHS  . pantoprazole (PROTONIX) IV  40 mg  Intravenous QHS  . potassium chloride  40 mEq Oral BID  . predniSONE  20 mg Oral Q breakfast  . simvastatin  10 mg Oral q1800  . ursodiol  300 mg Oral BID   Continuous Infusions: . azithromycin    . cefTRIAXone (ROCEPHIN)  IV    . lactated ringers 1,000 mL with potassium chloride 20 mEq infusion 100 mL/hr at 12/26/19 2306  . metronidazole 500 mg (12/27/19 0525)          Aline August, MD [facility] 12/27/2019, 11:30 AM

## 2019-12-27 NOTE — ED Notes (Signed)
Son notified patient transferred to room.

## 2019-12-28 ENCOUNTER — Inpatient Hospital Stay (HOSPITAL_COMMUNITY): Payer: Medicare Other

## 2019-12-28 ENCOUNTER — Telehealth: Payer: Self-pay | Admitting: Internal Medicine

## 2019-12-28 DIAGNOSIS — K50919 Crohn's disease, unspecified, with unspecified complications: Secondary | ICD-10-CM

## 2019-12-28 DIAGNOSIS — K5732 Diverticulitis of large intestine without perforation or abscess without bleeding: Secondary | ICD-10-CM

## 2019-12-28 DIAGNOSIS — Z7952 Long term (current) use of systemic steroids: Secondary | ICD-10-CM

## 2019-12-28 LAB — BLOOD GAS, ARTERIAL
Acid-base deficit: 1.2 mmol/L (ref 0.0–2.0)
Bicarbonate: 21.4 mmol/L (ref 20.0–28.0)
O2 Saturation: 88.1 %
Patient temperature: 98.5
pCO2 arterial: 29 mmHg — ABNORMAL LOW (ref 32.0–48.0)
pH, Arterial: 7.481 — ABNORMAL HIGH (ref 7.350–7.450)
pO2, Arterial: 56.9 mmHg — ABNORMAL LOW (ref 83.0–108.0)

## 2019-12-28 LAB — BASIC METABOLIC PANEL
Anion gap: 13 (ref 5–15)
BUN: 59 mg/dL — ABNORMAL HIGH (ref 8–23)
CO2: 20 mmol/L — ABNORMAL LOW (ref 22–32)
Calcium: 8 mg/dL — ABNORMAL LOW (ref 8.9–10.3)
Chloride: 109 mmol/L (ref 98–111)
Creatinine, Ser: 3.02 mg/dL — ABNORMAL HIGH (ref 0.44–1.00)
GFR calc Af Amer: 16 mL/min — ABNORMAL LOW (ref >60)
GFR calc non Af Amer: 14 mL/min — ABNORMAL LOW (ref >60)
Glucose, Bld: 131 mg/dL — ABNORMAL HIGH (ref 70–99)
Potassium: 4.5 mmol/L (ref 3.5–5.1)
Sodium: 142 mmol/L (ref 135–145)

## 2019-12-28 LAB — CBC WITH DIFFERENTIAL/PLATELET
Abs Immature Granulocytes: 0.33 10*3/uL — ABNORMAL HIGH (ref 0.00–0.07)
Basophils Absolute: 0.1 10*3/uL (ref 0.0–0.1)
Basophils Relative: 0 %
Eosinophils Absolute: 0 10*3/uL (ref 0.0–0.5)
Eosinophils Relative: 0 %
HCT: 28.9 % — ABNORMAL LOW (ref 36.0–46.0)
Hemoglobin: 9.3 g/dL — ABNORMAL LOW (ref 12.0–15.0)
Immature Granulocytes: 2 %
Lymphocytes Relative: 11 %
Lymphs Abs: 2.4 10*3/uL (ref 0.7–4.0)
MCH: 32.6 pg (ref 26.0–34.0)
MCHC: 32.2 g/dL (ref 30.0–36.0)
MCV: 101.4 fL — ABNORMAL HIGH (ref 80.0–100.0)
Monocytes Absolute: 1.4 10*3/uL — ABNORMAL HIGH (ref 0.1–1.0)
Monocytes Relative: 6 %
Neutro Abs: 18.1 10*3/uL — ABNORMAL HIGH (ref 1.7–7.7)
Neutrophils Relative %: 81 %
Platelets: 404 10*3/uL — ABNORMAL HIGH (ref 150–400)
RBC: 2.85 MIL/uL — ABNORMAL LOW (ref 3.87–5.11)
RDW: 12.8 % (ref 11.5–15.5)
WBC: 22.3 10*3/uL — ABNORMAL HIGH (ref 4.0–10.5)
nRBC: 0 % (ref 0.0–0.2)

## 2019-12-28 LAB — COMPREHENSIVE METABOLIC PANEL
ALT: 10 U/L (ref 0–44)
AST: 16 U/L (ref 15–41)
Albumin: 3.1 g/dL — ABNORMAL LOW (ref 3.5–5.0)
Alkaline Phosphatase: 59 U/L (ref 38–126)
Anion gap: 11 (ref 5–15)
BUN: 61 mg/dL — ABNORMAL HIGH (ref 8–23)
CO2: 21 mmol/L — ABNORMAL LOW (ref 22–32)
Calcium: 8 mg/dL — ABNORMAL LOW (ref 8.9–10.3)
Chloride: 110 mmol/L (ref 98–111)
Creatinine, Ser: 2.89 mg/dL — ABNORMAL HIGH (ref 0.44–1.00)
GFR calc Af Amer: 17 mL/min — ABNORMAL LOW (ref >60)
GFR calc non Af Amer: 15 mL/min — ABNORMAL LOW (ref >60)
Glucose, Bld: 94 mg/dL (ref 70–99)
Potassium: 3.8 mmol/L (ref 3.5–5.1)
Sodium: 142 mmol/L (ref 135–145)
Total Bilirubin: 0.6 mg/dL (ref 0.3–1.2)
Total Protein: 6.5 g/dL (ref 6.5–8.1)

## 2019-12-28 LAB — MAGNESIUM: Magnesium: 1.5 mg/dL — ABNORMAL LOW (ref 1.7–2.4)

## 2019-12-28 LAB — BRAIN NATRIURETIC PEPTIDE: B Natriuretic Peptide: 816.1 pg/mL — ABNORMAL HIGH (ref 0.0–100.0)

## 2019-12-28 LAB — C-REACTIVE PROTEIN: CRP: 3.7 mg/dL — ABNORMAL HIGH (ref <1.0)

## 2019-12-28 MED ORDER — FUROSEMIDE 10 MG/ML IJ SOLN
INTRAMUSCULAR | Status: AC
  Filled 2019-12-28: qty 8

## 2019-12-28 MED ORDER — FUROSEMIDE 10 MG/ML IJ SOLN
80.0000 mg | Freq: Once | INTRAMUSCULAR | Status: AC
  Administered 2019-12-28: 80 mg via INTRAVENOUS
  Filled 2019-12-28: qty 8

## 2019-12-28 MED ORDER — FUROSEMIDE 10 MG/ML IJ SOLN
20.0000 mg | Freq: Once | INTRAMUSCULAR | Status: AC
  Administered 2019-12-28: 20 mg via INTRAVENOUS
  Filled 2019-12-28: qty 2

## 2019-12-28 MED ORDER — SODIUM CHLORIDE 0.9 % IV SOLN
INTRAVENOUS | Status: DC | PRN
  Administered 2019-12-28 – 2019-12-30 (×3): 250 mL via INTRAVENOUS

## 2019-12-28 MED ORDER — FUROSEMIDE 10 MG/ML IJ SOLN
80.0000 mg | Freq: Once | INTRAMUSCULAR | Status: AC
  Administered 2019-12-28: 80 mg via INTRAVENOUS

## 2019-12-28 MED ORDER — SODIUM CHLORIDE 0.9 % IV SOLN
3.0000 g | Freq: Two times a day (BID) | INTRAVENOUS | Status: DC
  Administered 2019-12-28 – 2019-12-30 (×4): 3 g via INTRAVENOUS
  Filled 2019-12-28 (×4): qty 3

## 2019-12-28 NOTE — Evaluation (Signed)
SLP Cancellation Note  Patient Details Name: [place] MRN: [id number] DOB: 08-12-1940   Cancelled treatment:       Reason Eval/Treat Not Completed: Other (comment)(pt currently npo x ice and medications, will see early in am for swallow eval, note pt for possible palliative referral)  Kathleen Lime, MS [facility] SLP Acute Rehab Services Office [phone]  Macario Golds 12/28/2019, 3:18 PM

## 2019-12-28 NOTE — Significant Event (Signed)
Rapid Response Event Note  Overview: Time Called: 8563 Arrival Time: 1497 Event Type: Respiratory  Initial Focused Assessment:  Called to bedside due to new onset respiratory distress and hypoxia. SPO2 77% on 7L HFNC. Patient in bed with increased work of breathing but able to speak and tell full stories. No accessory muscle use. No cyanosis noted. Patient placed on non-re breather with recovery of SPO2 at 100%. Patient does have 3+ pitting edema on lower extremities. Per chart patient 2+ litre's positive.  Breath sounds diminished, no wheezing, slight crackles. Patient had received 80 of Lasix this morning. Reported 750 ml out. MD paged and notified of change in status. 80 of lasix given as ordered. Patient remaining normotensive and normal sinus rhythm on monitor. Repeat chest Xray ordered. O2 titrated down, patient now on 13L HFNC. Throughout event patient remained alert and reflected on her life. Patient states her breathing feels much easier.  Patient to remain on progressive unit, night shift RRT RN to round as well. Please call with any additional concerns.   Interventions:  Titration of O2 35m Lasix IV Chest Xray  Plan of Care (if not transferred):  Repeat Chest Xray Strict I & O  Event Summary: Name of Physician Notified: ACyndi BenderMD at [phone]   at    Outcome: Stayed in room and stabalized  Event End Time: 1[place]

## 2019-12-28 NOTE — Progress Notes (Signed)
When rounding, this nurse noted patient to be in respiratory distress. O2 74-75% on 7L HFNC. O2 bumped to 15L, patient slow to respond. Placed on NRB. Rapid and respiratory called to bedside and MD Alekh paged and made aware with no new orders. Pt alert with appropriate conversation during event. Verbal orders given to rapid for 80 IV Lasix and CXR. Orders carried out. Pt now 93% on 13L HFNC. Will continue to monitor patient closely.

## 2019-12-28 NOTE — Evaluation (Signed)
Physical Therapy Evaluation Patient Details Name: [name] [name] MRN: [id number] DOB: [birth year] Today's Date: 12/28/2019   History of Present Illness  79 yo female admitted with acute encephalopathy, sepsis. Hx of T11/L1 comp fx, Crohns, OA, migraines, CKD  Clinical Impression  On eval, pt required Min assist for mobility. She was able to stand and pivot to recliner with use of a RW. Pt presents with general weakness, decreased activity tolerance, and impaired gait and balance. Dyspnea 3/4 with activity. O2: 94% on 5L at rest, 88% on 5L with a small amount of activity. Discussed d/c plan with pt and family, all are agreeable with ST SNF to regain independence.     Follow Up Recommendations SNF    Equipment Recommendations  None recommended by PT    Recommendations for Other Services       Precautions / Restrictions Precautions Precautions: Fall Precaution Comments: monitor O2 Restrictions Weight Bearing Restrictions: No      Mobility  Bed Mobility Overal bed mobility: Needs Assistance Bed Mobility: Supine to Sit     Supine to sit: Min assist;HOB elevated     General bed mobility comments: Assist for trunk. Incresed time. Cues for safety, technique.  Transfers Overall transfer level: Needs assistance Equipment used: Rolling walker (2 wheeled) Transfers: Sit to/from [facility] Sit to Stand: Min assist Stand pivot transfers: Min assist       General transfer comment: Assist to power up, stabilize, control descent. VCs safety, hand placement. Stand pivot, bed to recliner, using RW. Dyspnea 3/4. O2 88% on 5L  Ambulation/Gait             General Gait Details: NT on today-too fatigued and sats drop with minimal activity  Stairs            Wheelchair Mobility    Modified Rankin (Stroke Patients Only)       Balance Overall balance assessment: Needs assistance         Standing balance support: Bilateral upper extremity  supported Standing balance-Leahy Scale: Poor                               Pertinent Vitals/Pain Pain Assessment: No/denies pain    Home Living Family/patient expects to be discharged to:: Unsure Living Arrangements: Children Available Help at Discharge: Family;Available PRN/intermittently Type of Home: House Home Access: Ramped entrance     Home Layout: One level Home Equipment: Cane - single point;Walker - 4 wheels;Walker - 2 wheels;Shower seat;Bedside commode Additional Comments: lives with son and DIL who work, pt is alone 12*/day, pt stated neighbors check on her    Prior Function Level of Independence: Independent with assistive device(s)         Comments: using rollator-household distances-rarely leaves home     Hand Dominance        Extremity/Trunk Assessment   Upper Extremity Assessment Upper Extremity Assessment: Generalized weakness    Lower Extremity Assessment Lower Extremity Assessment: Generalized weakness    Cervical / Trunk Assessment Cervical / Trunk Assessment: Kyphotic  Communication   Communication: No difficulties  Cognition Arousal/Alertness: Awake/alert Behavior During Therapy: WFL for tasks assessed/performed Overall Cognitive Status: Within Functional Limits for tasks assessed                                        General Comments  Exercises     Assessment/Plan    PT Assessment Patient needs continued PT services  PT Problem List Decreased strength;Decreased mobility;Decreased activity tolerance;Decreased balance       PT Treatment Interventions DME instruction;Gait training;Therapeutic activities;Therapeutic exercise;Patient/family education;Balance training;Functional mobility training    PT Goals (Current goals can be found in the Care Plan section)  Acute Rehab PT Goals Patient Stated Goal: to get better. to be able to get out of the house more. PT Goal Formulation: With  patient/family Time For Goal Achievement: 01/11/20 Potential to Achieve Goals: Good    Frequency Min 2X/week   Barriers to discharge        Co-evaluation               AM-PAC PT "6 Clicks" Mobility  Outcome Measure Help needed turning from your back to your side while in a flat bed without using bedrails?: A Little Help needed moving from lying on your back to sitting on the side of a flat bed without using bedrails?: A Little Help needed moving to and from a bed to a chair (including a wheelchair)?: A Little Help needed standing up from a chair using your arms (e.g., wheelchair or bedside chair)?: A Little Help needed to walk in hospital room?: A Lot Help needed climbing 3-5 steps with a railing? : A Lot 6 Click Score: 16    End of Session Equipment Utilized During Treatment: Gait belt;Oxygen Activity Tolerance: Patient limited by fatigue Patient left: in chair;with call bell/phone within reach;with chair alarm set;with family/visitor present   PT Visit Diagnosis: Muscle weakness (generalized) (M62.81);Difficulty in walking, not elsewhere classified (R26.2);Unsteadiness on feet (R26.81)    Time: 9728-2060 PT Time Calculation (min) (ACUTE ONLY): 16 min   Charges:   PT Evaluation $PT Eval Low Complexity: 1 Low         Domani Bakos P, PT Acute Rehabilitation

## 2019-12-28 NOTE — Progress Notes (Signed)
Pt noted to have low o2 sats. Placed on [facility] with minimal improvement.   0315: O2 sats noted to drop again to 86-88%. Placed pt on 5L HFNC. Reassessment done and noted to have developed crackles with new onset of a cough. Pt expresses some difficulty with breathing, but in no distress.   MD paged of such. Orders to stop fluids, along with CXR ordered.   Following review, 18m of lasix ordered,  Pt now on 6L HFNC O2 sats 94%.   Will continue to assess and monitor pt.  Resting in bed in NAD.

## 2019-12-28 NOTE — Progress Notes (Signed)
Patient ID: [name], female   DOB: 1940/10/18, 79 y.o.   MRN: [id number]  PROGRESS NOTE    [name] [name]  VXB:[id number] DOB: Dec 06, 1940 DOA: 12/26/2019 PCP: Hoyt Koch, MD   Brief Narrative:  79 year old female with history of advanced dementia, Crohn's colitis, GERD, hypertension, chronic kidney disease stage III, unspecified depression, arthritis presented with generalized weakness, poor oral intake and pain all over.  In route, EMS noted fever of 101.2.  In the ED, patient had leukocytosis with slightly elevated creatinine from baseline along with positive fecal occult blood.  Imaging was notable for possible sigmoid diverticulitis along with pneumonia.  She was started on intravenous antibiotics.  Assessment & Plan:   Acute hypoxic respiratory failure -Patient states her status worsened overnight of 12/27/2019 and is currently requiring 8-9 L high flow nasal cannula.  IV fluids was discontinued.  She was given 1 dose of intravenous Lasix 20 mg.  Chest x-ray overnight showed probable pulmonary edema.  Not much urine output since intravenous Lasix.  Will give 1 more dose of Lasix 80 mg.  Strict input output.  Daily weights.  Acute metabolic encephalopathy in a patient with advanced dementia -Probably multifactorial secondary to sepsis from pneumonia/colitis -Monitor mental status.  Fall precaution.  Delirium precautions.  Mental status is probably back to baseline.  Sepsis: Present on admission Community-acquired multilobar pneumonia with concern for aspiration as well Probable sigmoid diverticulitis -Currently on broad-spectrum antibiotics Rocephin and Zithromax along with Flagyl -Follow cultures. Currently hemodynamically stable. -IV fluids discontinued.  Leukocytosis -Worsening at 22.3 today.  Monitor  History of Crohn's disease -Patient is on chronic steroids.  Continue.  No signs of flare on CT.  Outpatient follow-up with GI.  Acute kidney injury on chronic  disease stage IIIb -Presented with creatinine of 3.06.  Creatinine 2.89 this morning.  Lasix plan as above.  Monitor -Repeat BMP later this afternoon.  If creatinine is worsening, might need renal ultrasound and nephrology input.  Multilevel compression fractures in the thoracolumbar spine -Probably chronic.  Fall precautions.  Pain management.  PT eval.  Anemia of chronic disease -From chronic kidney disease.  Hemoglobin stable, 9.3 today.  Monitor -Fecal occult blood test is positive but there is no evidence of overt GI bleeding.  Doubt that patient has GI bleed.  Advanced dementia Depression -Fall precautions. -Delirium precautions.  Monitor mental status -Continue Prozac, Namenda, Remeron  Minimally elevated troponin -Troponins did not trend up.  EKG without ST depression or elevation.  No further work-up needed.  Outpatient follow-up  Hypertension  -Monitor blood pressure.  Continue amlodipine.  Hypokalemia -Improved  GERD -Continue PPI  Generalized deconditioning -PT eval -Overall condition has worsened.  Will request palliative care evaluation for goals of care discussion.  If condition does not improve, consider hospice/comfort measures   DVT prophylaxis: SCDs.  Holding chemical prophylaxis because of positive occult blood and anemia Code Status: DNR.   Family Communication: Spoke to son/Stanley on phone on 12/28/2019 Disposition Plan: Status is: Inpatient  Remains inpatient appropriate because:IV treatments appropriate due to intensity of illness or inability to take PO.  Overall condition has worsened.  Patient requiring high flow nasal cannula oxygen with worsening leukocytosis and kidney function is not improving.   Dispo: The patient is from: Home              Anticipated d/c is to: Unclear              Anticipated d/c date is: Unclear  Patient currently is not medically stable to d/c.    Consultants: None  Procedures: None  Antimicrobials:  Rocephin, Zithromax and Flagyl from 12/26/2019 onwards   Subjective: Patient seen and examined at bedside.  Poor historian.  Confused.  Nursing staff reports that patient's mental status worsened overnight and is currently on hyponasal nasal oxygen.  No overnight fever or vomiting reported. Objective: Vitals:   12/28/19 0229 12/28/19 0235 12/28/19 0526 12/28/19 0633  BP:   (!) 163/129 (!) 166/98  Pulse:   88 83  Resp:   20   Temp:   97.9 F (36.6 C)   TempSrc:   Oral   SpO2: 92% 94% 92%   Weight:      Height:        Intake/Output Summary (Last 24 hours) at 12/28/2019 0732 Last data filed at 12/28/2019 0600 Gross per 24 hour  Intake 1331.74 ml  Output --  Net 1331.74 ml   Filed Weights   12/27/19 1426  Weight: 74.6 kg    Examination:  General exam: Appears to be in mild distress secondary to dyspnea.  Currently on 5 L high flow nasal cannula.  Looks chronically ill. Respiratory system: Bilateral decreased breath sounds at bases with crackles.  No wheezing  cardiovascular system: Rate controlled, S1-S2 heard Gastrointestinal system: Abdomen is nondistended, soft and mildly tender in the lower quadrant.  Bowel sounds heard Extremities: Trace lower extremity edema present.  No clubbing Central nervous system: Awake but more confused today.  No focal neurological deficits. Moving extremities Skin: No obvious ulcers or ecchymosis  psychiatry: Cannot assess because of mental status.  Intermittently gets angry.  Data Reviewed: I have personally reviewed following labs and imaging studies  CBC: Recent Labs  Lab 12/26/19 1324 12/26/19 1908 12/27/19 0406 12/28/19 0536  WBC 20.8* 15.5* 13.1* 22.3*  NEUTROABS 19.9*  --   --  18.1*  HGB 10.5* 9.3* 9.0* 9.3*  HCT 31.8* 28.5* 27.5* 28.9*  MCV 99.1 100.0 99.6 101.4*  PLT 422* 364 359 650*   Basic Metabolic Panel: Recent Labs  Lab 12/26/19 1324 12/26/19 1908 12/27/19 0406 12/28/19 0536  NA 141  --  142 142  K 2.9*  --  3.7  3.8  CL 104  --  110 110  CO2 23  --  22 21*  GLUCOSE 138*  --  79 94  BUN 69*  --  60* 61*  CREATININE 3.06* 2.99* 2.76* 2.89*  CALCIUM 7.9*  --  7.8* 8.0*  MG  --   --   --  1.5*   GFR: Estimated Creatinine Clearance: 16.6 mL/min (A) (by C-G formula based on SCr of 2.89 mg/dL (H)). Liver Function Tests: Recent Labs  Lab 12/26/19 1324 12/27/19 0406 12/28/19 0536  AST 17 14* 16  ALT 11 10 10   ALKPHOS 65 55 59  BILITOT 0.7 0.4 0.6  PROT 6.9 6.1* 6.5  ALBUMIN 3.3* 3.0* 3.1*   No results for input(s): LIPASE, AMYLASE in the last 168 hours. No results for input(s): AMMONIA in the last 168 hours. Coagulation Profile: Recent Labs  Lab 12/26/19 1324  INR 0.9   Cardiac Enzymes: No results for input(s): CKTOTAL, CKMB, CKMBINDEX, TROPONINI in the last 168 hours. BNP (last 3 results) No results for input(s): PROBNP in the last 8760 hours. HbA1C: No results for input(s): HGBA1C in the last 72 hours. CBG: No results for input(s): GLUCAP in the last 168 hours. Lipid Profile: No results for input(s): CHOL, HDL, LDLCALC, TRIG, CHOLHDL, LDLDIRECT  in the last 72 hours. Thyroid Function Tests: No results for input(s): TSH, T4TOTAL, FREET4, T3FREE, THYROIDAB in the last 72 hours. Anemia Panel: No results for input(s): VITAMINB12, FOLATE, FERRITIN, TIBC, IRON, RETICCTPCT in the last 72 hours. Sepsis Labs: Recent Labs  Lab 12/26/19 1324 12/26/19 1548  LATICACIDVEN 1.1 1.0    Recent Results (from the past 240 hour(s))  Urine culture     Status: None   Collection Time: 12/26/19  1:25 PM   Specimen: Urine, Random  Result Value Ref Range Status   Specimen Description   Final    URINE, RANDOM Performed at [facility] [address], [place], [place] [address]    Special Requests   Final    NONE Performed at [facility], [place] [address] [place], [place] [address]    Culture   Final    NO GROWTH Performed at [facility],  [place] [address], [place], [place] [address]    Report Status 12/27/2019 FINAL  Final  Culture, blood (routine x 2)     Status: None (Preliminary result)   Collection Time: 12/26/19  1:25 PM   Specimen: BLOOD RIGHT FOREARM  Result Value Ref Range Status   Specimen Description   Final    BLOOD RIGHT FOREARM Performed at [facility] [address], [place], [place] [address]    Special Requests   Final    BOTTLES DRAWN AEROBIC AND ANAEROBIC Blood Culture results may not be optimal due to an inadequate volume of blood received in culture bottles Performed at [facility] [address], [place], [place] [address]    Culture   Final    NO GROWTH 2 DAYS Performed at [facility] [address], [place], [place] [address]    Report Status PENDING  Incomplete  SARS Coronavirus 2 by RT PCR (hospital order, performed in [facility] hospital lab) Nasopharyngeal Nasopharyngeal Swab     Status: None   Collection Time: 12/26/19  1:28 PM   Specimen: Nasopharyngeal Swab  Result Value Ref Range Status   SARS Coronavirus 2 NEGATIVE NEGATIVE Final    Comment: (NOTE) SARS-CoV-2 target nucleic acids are NOT DETECTED. The SARS-CoV-2 RNA is generally detectable in upper and lower respiratory specimens during the acute phase of infection. The lowest concentration of SARS-CoV-2 viral copies this assay can detect is 250 copies / mL. A negative result does not preclude SARS-CoV-2 infection and should not be used as the sole basis for treatment or other patient management decisions.  A negative result may occur with improper specimen collection / handling, submission of specimen other than nasopharyngeal swab, presence of viral mutation(s) within the areas targeted by this assay, and inadequate number of viral copies (<250 copies / mL). A negative result must be combined with clinical observations, patient history, and epidemiological information. Fact Sheet for  Patients:   StrictlyIdeas.no Fact Sheet for Healthcare Providers: BankingDealers.co.za This test is not yet approved or cleared  by the [place] FDA and has been authorized for detection and/or diagnosis of SARS-CoV-2 by FDA under an Emergency Use Authorization (EUA).  This EUA will remain in effect (meaning this test can be used) for the duration of the COVID-19 declaration under Section 564(b)(1) of the Act, 21 U.S.C. section 360bbb-3(b)(1), unless the authorization is terminated or revoked sooner. Performed at [facility], [place] [address], [place], [place] [address]   Culture, blood (routine x 2)  Status: None (Preliminary result)   Collection Time: 12/26/19  1:30 PM   Specimen: BLOOD LEFT FOREARM  Result Value Ref Range Status   Specimen Description   Final    BLOOD LEFT FOREARM Performed at [facility] [address] [place], [place] [address]    Special Requests   Final    BOTTLES DRAWN AEROBIC AND ANAEROBIC Blood Culture adequate volume Performed at [facility] [address], [place], [place] [address]    Culture   Final    NO GROWTH 2 DAYS Performed at [facility] [address], [place],  [address]    Report Status PENDING  Incomplete         Radiology Studies: CT Head Wo Contrast  Result Date: 12/26/2019 CLINICAL DATA:  Encephalopathy. Unable to ambulate today. Reduced oral intake. Urinary incontinence. Malodorous urine. EXAM: CT HEAD WITHOUT CONTRAST TECHNIQUE: Contiguous axial images were obtained from the base of the skull through the vertex without intravenous contrast. COMPARISON:  08/31/2009 FINDINGS: Brain: The brainstem, cerebellum, cerebral peduncles, thalami, basal ganglia, basilar cisterns, and ventricular system appear within normal limits. No intracranial hemorrhage, mass lesion, or acute CVA. Periventricular white matter and  corona radiata hypodensities favor chronic ischemic microvascular white matter disease. Vascular: Unremarkable Skull: Unremarkable Sinuses/Orbits: Unremarkable Other: No supplemental non-categorized findings. IMPRESSION: 1. No acute intracranial findings. 2. Periventricular white matter and corona radiata hypodensities favor chronic ischemic microvascular white matter disease. Electronically Signed   By: Van Clines M.D.   On: 12/26/2019 15:48   CT Chest Wo Contrast  Result Date: 12/26/2019 CLINICAL DATA:  Right upper lung opacity chest radiography for further characterization. Left lung opacities also noted. Altered mental status. EXAM: CT CHEST, ABDOMEN AND PELVIS WITHOUT CONTRAST TECHNIQUE: Multidetector CT imaging of the chest, abdomen and pelvis was performed following the standard protocol without IV contrast. COMPARISON:  Multiple exams, including chest radiograph from 12/26/2019 and CT abdomen from 01/27/2019 FINDINGS: CT CHEST FINDINGS Cardiovascular: Coronary, aortic arch, and branch vessel atherosclerotic vascular disease. Mild cardiomegaly. Mediastinum/Nodes: Unremarkable Lungs/Pleura: Scattered scarring in the lungs. Confluent ground-glass opacities inferiorly in the right upper lobe and in the right middle lobe. Less degree of hazy ground-glass opacity posteriorly in the left upper lobe. Old granulomatous disease. Small left and trace right pleural effusion. Musculoskeletal: Small ossicles in the bicipital groove likely represent free osteochondral fragments in the right biceps recess of the glenohumeral joint. Similar findings on the left. Prior vertebral augmentations at T7 and T9. Age indeterminate endplate compression fractures at T8, T11, T12, and L1. CT ABDOMEN PELVIS FINDINGS Hepatobiliary: Mild lobularity of the a hepatic contour could reflect early cirrhosis. Cholecystectomy. No appreciable significant degree of biliary dilatation. Pancreas: Unremarkable Spleen: Unremarkable  Adrenals/Urinary Tract: Both adrenal glands appear normal. Low-density lobularity from the left kidney upper pole, probably a cyst although technically nonspecific, measuring about 3.1 by 1.5 cm, previously measuring about 2.3 by 1.4 cm on 01/27/2019. No urinary tract calculi, hydronephrosis, or hydroureter. Urinary bladder unremarkable, without wall thickening. Stomach/Bowel: Periampullary duodenal diverticulum. Descending and sigmoid colon diverticulosis. There is some stranding along the margin of the proximal sigmoid colon on image 87/4 suggesting mild acute diverticulitis. Vascular/Lymphatic: Aortoiliac atherosclerotic vascular disease. Reproductive: Uterus absent.  Adnexa unremarkable. Other: Trace free pelvic fluid, etiology uncertain. Musculoskeletal: Postoperative findings in the lower lumbar spine. Grade 1 degenerative anterolisthesis at L4-5. Probably late subacute superior endplate compression fractures at L1, L2, and L4 (the L1 and L2 compressions were not present on  01/27/2019 and the L4 compression of the superior endplate was less striking on that exam). Posterior decompression at L4-L5-S1. IMPRESSION: 1. Confluent ground-glass opacities inferiorly in the right upper lobe and in the right middle lobe, with lesser degree of hazy ground-glass opacity posteriorly in the left upper lobe. Appearance favors atypical pneumonia over asymmetric edema. 2. Descending and sigmoid colon diverticulosis with mild proximal sigmoid colon diverticulitis. No extraluminal gas or abscess. 3. Small left and trace right pleural effusions. 4. Other imaging findings of potential clinical significance: Coronary atherosclerosis with mild cardiomegaly. Mild lobularity of the a hepatic contour could reflect early cirrhosis. Periampullary duodenal diverticulum. Trace free pelvic fluid, etiology uncertain. Descending and sigmoid colon diverticulosis. Multilevel compression fractures in the spine including T7, T8, T9, T11, T12, L1,  L2, and L4. Posterior decompression at L4-L5-S1. Small free osteochondral fragments in the glenohumeral joints. Cardiomegaly. 5. Aortic atherosclerosis. Aortic Atherosclerosis (ICD10-I70.0). Electronically Signed   By: Van Clines M.D.   On: 12/26/2019 16:01   DG CHEST PORT 1 VIEW  Result Date: 12/28/2019 CLINICAL DATA:  Shortness of breath. EXAM: PORTABLE CHEST 1 VIEW COMPARISON:  Radiograph and CT 2 days ago FINDINGS: Stable cardiomegaly. Progression and right greater than left interstitial perihilar opacities suspicious for pulmonary edema. Increased retrocardiac opacity likely combination of pleural fluid and atelectasis/airspace disease. Small right pleural effusion. No pneumothorax. Vertebroplasty in the midthoracic spine. IMPRESSION: 1. Progression of right greater than left perihilar interstitial opacities, pulmonary edema versus atypical pneumonia. 2. Increased retrocardiac opacity likely combination of pleural fluid and atelectasis/airspace disease. Cardiomegaly is stable. Electronically Signed   By: Keith Rake M.D.   On: 12/28/2019 03:21   DG Chest Port 1 View  Result Date: 12/26/2019 CLINICAL DATA:  Generalized weakness EXAM: PORTABLE CHEST 1 VIEW COMPARISON:  Chest radiograph 01/27/2019 FINDINGS: Monitoring leads overlie the patient. Stable enlarged cardiac and mediastinal contours. Heterogeneous opacities left lung base. Possible small left pleural effusion. No pneumothorax. Irregular opacity within the right upper lung. IMPRESSION: 1. Heterogeneous opacities left lung base may represent atelectasis or infection. 2. Irregular opacity within the right upper lung is nonspecific. Underlying pulmonary nodule/mass is not excluded. This needs dedicated evaluation with chest CT. Electronically Signed   By: Lovey Newcomer M.D.   On: 12/26/2019 14:52   CT Renal Stone Study  Result Date: 12/26/2019 CLINICAL DATA:  Right upper lung opacity chest radiography for further characterization. Left  lung opacities also noted. Altered mental status. EXAM: CT CHEST, ABDOMEN AND PELVIS WITHOUT CONTRAST TECHNIQUE: Multidetector CT imaging of the chest, abdomen and pelvis was performed following the standard protocol without IV contrast. COMPARISON:  Multiple exams, including chest radiograph from 12/26/2019 and CT abdomen from 01/27/2019 FINDINGS: CT CHEST FINDINGS Cardiovascular: Coronary, aortic arch, and branch vessel atherosclerotic vascular disease. Mild cardiomegaly. Mediastinum/Nodes: Unremarkable Lungs/Pleura: Scattered scarring in the lungs. Confluent ground-glass opacities inferiorly in the right upper lobe and in the right middle lobe. Less degree of hazy ground-glass opacity posteriorly in the left upper lobe. Old granulomatous disease. Small left and trace right pleural effusion. Musculoskeletal: Small ossicles in the bicipital groove likely represent free osteochondral fragments in the right biceps recess of the glenohumeral joint. Similar findings on the left. Prior vertebral augmentations at T7 and T9. Age indeterminate endplate compression fractures at T8, T11, T12, and L1. CT ABDOMEN PELVIS FINDINGS Hepatobiliary: Mild lobularity of the a hepatic contour could reflect early cirrhosis. Cholecystectomy. No appreciable significant degree of biliary dilatation. Pancreas: Unremarkable Spleen: Unremarkable Adrenals/Urinary Tract: Both adrenal glands appear normal.  Low-density lobularity from the left kidney upper pole, probably a cyst although technically nonspecific, measuring about 3.1 by 1.5 cm, previously measuring about 2.3 by 1.4 cm on 01/27/2019. No urinary tract calculi, hydronephrosis, or hydroureter. Urinary bladder unremarkable, without wall thickening. Stomach/Bowel: Periampullary duodenal diverticulum. Descending and sigmoid colon diverticulosis. There is some stranding along the margin of the proximal sigmoid colon on image 87/4 suggesting mild acute diverticulitis. Vascular/Lymphatic:  Aortoiliac atherosclerotic vascular disease. Reproductive: Uterus absent.  Adnexa unremarkable. Other: Trace free pelvic fluid, etiology uncertain. Musculoskeletal: Postoperative findings in the lower lumbar spine. Grade 1 degenerative anterolisthesis at L4-5. Probably late subacute superior endplate compression fractures at L1, L2, and L4 (the L1 and L2 compressions were not present on 01/27/2019 and the L4 compression of the superior endplate was less striking on that exam). Posterior decompression at L4-L5-S1. IMPRESSION: 1. Confluent ground-glass opacities inferiorly in the right upper lobe and in the right middle lobe, with lesser degree of hazy ground-glass opacity posteriorly in the left upper lobe. Appearance favors atypical pneumonia over asymmetric edema. 2. Descending and sigmoid colon diverticulosis with mild proximal sigmoid colon diverticulitis. No extraluminal gas or abscess. 3. Small left and trace right pleural effusions. 4. Other imaging findings of potential clinical significance: Coronary atherosclerosis with mild cardiomegaly. Mild lobularity of the a hepatic contour could reflect early cirrhosis. Periampullary duodenal diverticulum. Trace free pelvic fluid, etiology uncertain. Descending and sigmoid colon diverticulosis. Multilevel compression fractures in the spine including T7, T8, T9, T11, T12, L1, L2, and L4. Posterior decompression at L4-L5-S1. Small free osteochondral fragments in the glenohumeral joints. Cardiomegaly. 5. Aortic atherosclerosis. Aortic Atherosclerosis (ICD10-I70.0). Electronically Signed   By: Van Clines M.D.   On: 12/26/2019 16:01        Scheduled Meds: . amLODipine  10 mg Oral Daily  . B-complex with vitamin C  1 tablet Oral Daily  . calcitonin (salmon)  1 spray Alternating Nares Daily  . FLUoxetine  20 mg Oral Daily  . furosemide  80 mg Intravenous Once  . memantine  10 mg Oral BID  . mirtazapine  15 mg Oral QHS  . pantoprazole (PROTONIX) IV  40  mg Intravenous QHS  . predniSONE  20 mg Oral Q breakfast  . simvastatin  10 mg Oral q1800  . ursodiol  300 mg Oral BID   Continuous Infusions: . sodium chloride 250 mL (12/28/19 0530)  . azithromycin 500 mg (12/27/19 1526)  . cefTRIAXone (ROCEPHIN)  IV    . metronidazole 500 mg (12/28/19 0531)          Aline August, MD [facility] 12/28/2019, 7:32 AM

## 2019-12-29 ENCOUNTER — Inpatient Hospital Stay (HOSPITAL_COMMUNITY): Payer: Medicare Other

## 2019-12-29 DIAGNOSIS — Z66 Do not resuscitate: Secondary | ICD-10-CM

## 2019-12-29 DIAGNOSIS — K529 Noninfective gastroenteritis and colitis, unspecified: Secondary | ICD-10-CM

## 2019-12-29 DIAGNOSIS — Z7189 Other specified counseling: Secondary | ICD-10-CM

## 2019-12-29 DIAGNOSIS — J9601 Acute respiratory failure with hypoxia: Secondary | ICD-10-CM

## 2019-12-29 DIAGNOSIS — Z515 Encounter for palliative care: Secondary | ICD-10-CM

## 2019-12-29 LAB — COMPREHENSIVE METABOLIC PANEL
ALT: 10 U/L (ref 0–44)
AST: 16 U/L (ref 15–41)
Albumin: 2.9 g/dL — ABNORMAL LOW (ref 3.5–5.0)
Alkaline Phosphatase: 53 U/L (ref 38–126)
Anion gap: 15 (ref 5–15)
BUN: 56 mg/dL — ABNORMAL HIGH (ref 8–23)
CO2: 21 mmol/L — ABNORMAL LOW (ref 22–32)
Calcium: 8.2 mg/dL — ABNORMAL LOW (ref 8.9–10.3)
Chloride: 106 mmol/L (ref 98–111)
Creatinine, Ser: 2.94 mg/dL — ABNORMAL HIGH (ref 0.44–1.00)
GFR calc Af Amer: 17 mL/min — ABNORMAL LOW (ref >60)
GFR calc non Af Amer: 15 mL/min — ABNORMAL LOW (ref >60)
Glucose, Bld: 74 mg/dL (ref 70–99)
Potassium: 3.4 mmol/L — ABNORMAL LOW (ref 3.5–5.1)
Sodium: 142 mmol/L (ref 135–145)
Total Bilirubin: 0.6 mg/dL (ref 0.3–1.2)
Total Protein: 5.9 g/dL — ABNORMAL LOW (ref 6.5–8.1)

## 2019-12-29 LAB — CBC WITH DIFFERENTIAL/PLATELET
Abs Immature Granulocytes: 0.27 10*3/uL — ABNORMAL HIGH (ref 0.00–0.07)
Basophils Absolute: 0 10*3/uL (ref 0.0–0.1)
Basophils Relative: 0 %
Eosinophils Absolute: 0 10*3/uL (ref 0.0–0.5)
Eosinophils Relative: 0 %
HCT: 26.7 % — ABNORMAL LOW (ref 36.0–46.0)
Hemoglobin: 8.4 g/dL — ABNORMAL LOW (ref 12.0–15.0)
Immature Granulocytes: 1 %
Lymphocytes Relative: 12 %
Lymphs Abs: 2.2 10*3/uL (ref 0.7–4.0)
MCH: 31.9 pg (ref 26.0–34.0)
MCHC: 31.5 g/dL (ref 30.0–36.0)
MCV: 101.5 fL — ABNORMAL HIGH (ref 80.0–100.0)
Monocytes Absolute: 1.2 10*3/uL — ABNORMAL HIGH (ref 0.1–1.0)
Monocytes Relative: 6 %
Neutro Abs: 15.4 10*3/uL — ABNORMAL HIGH (ref 1.7–7.7)
Neutrophils Relative %: 81 %
Platelets: 348 10*3/uL (ref 150–400)
RBC: 2.63 MIL/uL — ABNORMAL LOW (ref 3.87–5.11)
RDW: 12.7 % (ref 11.5–15.5)
WBC: 19.1 10*3/uL — ABNORMAL HIGH (ref 4.0–10.5)
nRBC: 0 % (ref 0.0–0.2)

## 2019-12-29 LAB — ECHOCARDIOGRAM COMPLETE
Height: 66 in
Weight: 2606.72 oz

## 2019-12-29 LAB — MAGNESIUM: Magnesium: 1.4 mg/dL — ABNORMAL LOW (ref 1.7–2.4)

## 2019-12-29 MED ORDER — POTASSIUM CHLORIDE 10 MEQ/100ML IV SOLN
10.0000 meq | INTRAVENOUS | Status: AC
  Administered 2019-12-29 (×3): 10 meq via INTRAVENOUS
  Filled 2019-12-29 (×3): qty 100

## 2019-12-29 MED ORDER — FUROSEMIDE 10 MG/ML IJ SOLN: 80.0000 mg | Freq: Two times a day (BID) | INTRAMUSCULAR | Status: DC

## 2019-12-29 MED ORDER — POTASSIUM CHLORIDE 10 MEQ/100ML IV SOLN
INTRAVENOUS | Status: AC
  Administered 2019-12-29: 10 meq
  Filled 2019-12-29: qty 100

## 2019-12-29 MED ORDER — MAGNESIUM SULFATE 2 GM/50ML IV SOLN
2.0000 g | Freq: Once | INTRAVENOUS | Status: AC
  Administered 2019-12-29: 2 g via INTRAVENOUS
  Filled 2019-12-29: qty 50

## 2019-12-29 MED ORDER — FUROSEMIDE 10 MG/ML IJ SOLN
80.0000 mg | Freq: Three times a day (TID) | INTRAMUSCULAR | Status: DC
  Administered 2019-12-29 – 2019-12-30 (×4): 80 mg via INTRAVENOUS
  Filled 2019-12-29 (×4): qty 8

## 2019-12-29 MED ORDER — FUROSEMIDE 10 MG/ML IJ SOLN
80.0000 mg | Freq: Two times a day (BID) | INTRAMUSCULAR | Status: DC
  Administered 2019-12-29: 80 mg via INTRAVENOUS
  Filled 2019-12-29: qty 8

## 2019-12-29 NOTE — Evaluation (Signed)
Clinical/Bedside Swallow Evaluation Patient Details  Name: [name] [name] MRN: [id number] Date of Birth: Dec 03, 1940  Today's Date: 12/29/2019 Time: SLP Start Time (ACUTE ONLY): 0750 SLP Stop Time (ACUTE ONLY): 0826 SLP Time Calculation (min) (ACUTE ONLY): 36 min  Past Medical History:  Past Medical History:  Diagnosis Date  . Allergy    year around allergies   . Arthritis    shoulder  . Bell's palsy   . Blood transfusion    1966  . Blood transfusion without reported diagnosis   . Cataract    bilaterally removed  . Colon polyps    adenomatous polyp June 2012  . Complication of anesthesia    slow to wake  . Crohn's colitis ([place])   . Dry eyes    left eye worst one  . GERD (gastroesophageal reflux disease)   . Headache(784.0)    hx migraines  . Hypertension   . Lumbar compression fracture, closed, initial encounter ([place]) 06/02/2019  . Protein-calorie malnutrition ([place]) 03/06/2019  . 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 Shortness of breath    Past Surgical History:  Past Surgical History:  Procedure Laterality Date  . ABDOMINAL HYSTERECTOMY    . APPENDECTOMY    . CATARACT EXTRACTION Bilateral    Separate dates  . CHOLECYSTECTOMY    . COLONOSCOPY  multiple  . flex sigmoidoscopy with biospy  01/17/12  . FLEXIBLE SIGMOIDOSCOPY  06/08/2011   severe Crohn's colitis to descending colon at least  . FOOT GANGLION EXCISION     left  . SHOULDER SURGERY     right  . TONSILLECTOMY     HPI:  79 yo female adm to [facility] with acute metabolic encephalopathy, has h/o dementia and had respiratory difficulties.  Per md notes, pt for palliative referral.  Swallow eval ordered.  Pt CXR 6/7 showed unchanged cardiomegaly and left consolidation with moderate pulmonary edema.  She required increased oxygen due to desaturation.   Assessment / Plan / Recommendation Clinical Impression  No focal CN deficits observed regarding swallow musculature and pt denies swallowing difficulties or  coughing with intake.  Pt did not pass 3 ounce Yale water test due to overtly coughing with swallow attempt - suspect inhalation of water after 1.5 ounces swallowed due to breath hunger.  SLP advised she stop water intake if needed prior to trial.  Suspect pt aspirated water due to inhalation but silent aspiration can not be ruled out due failing 3 ounce water test. No indication of aspiration or dysphagia with small boluses of thin via straw/cup, eggs/potatoes nor grits.  Swallow clinically appeared timely with observation of laryngeal closure.   Contacted MD regarding pt's care plan - note palliative referral pending.  At this time, plan is to hold on instrumental evaluation *MBS* until after palliative meeting to establish pt's [place].  In the interim, recommend continue diet - advise medications with puree to mitigate aspiration. SLP Visit Diagnosis: Dysphagia, unspecified (R13.10)    Aspiration Risk  Mild aspiration risk    Diet Recommendation Regular;Thin liquid   Liquid Administration via: Cup;Straw Medication Administration: Whole meds with puree Supervision: Patient able to self feed Compensations: Slow rate;Small sips/bites Postural Changes: Remain upright for at least 30 minutes after po intake;Seated upright at 90 degrees    Other  Recommendations Oral Care Recommendations: Oral care BID   Follow up Recommendations None      Frequency and Duration min 1 x/week  1 week       Prognosis Prognosis for Safe  Diet Advancement: Fair Barriers to Reach Goals: Cognitive deficits      Swallow Study   General HPI: 79 yo female adm to [facility] with acute metabolic encephalopathy, has h/o dementia and had respiratory difficulties.  Per md notes, pt for palliative referral.  Swallow eval ordered.  Pt CXR 6/7 showed unchanged cardiomegaly and left consolidation with moderate pulmonary edema.  She required increased oxygen due to desaturation. Type of Study: Bedside Swallow Evaluation Diet Prior to  this Study: Regular;Thin liquids Temperature Spikes Noted: No Respiratory Status: Nasal cannula(HFNC) History of Recent Intubation: No Behavior/Cognition: Alert;Cooperative;Pleasant mood Oral Cavity Assessment: Within Functional Limits Oral Care Completed by SLP: No Oral Cavity - Dentition: Dentures, top(partial for lower at home per pt) Vision: Functional for self-feeding Self-Feeding Abilities: Able to feed self Patient Positioning: Upright in bed Baseline Vocal Quality: Normal Volitional Cough: Strong Volitional Swallow: Able to elicit    Oral/Motor/Sensory Function Overall Oral Motor/Sensory Function: Within functional limits   Ice Chips Ice chips: Not tested   Thin Liquid Thin Liquid: Impaired Presentation: Straw;Cup;Self Fed Pharyngeal  Phase Impairments: Cough - Immediate Other Comments: Pt did not pass 3 ounce yale water test but tolerated small boluses of liquids without cough    Nectar Thick Nectar Thick Liquid: Not tested   Honey Thick Honey Thick Liquid: Not tested   Puree Puree: Within functional limits Presentation: Self Fed;Spoon   Solid     Solid: Within functional limits Presentation: Self Fed;Spoon      Macario Golds 12/29/2019,10:42 AM  Kathleen Lime, MS [place] Office [phone]

## 2019-12-29 NOTE — Progress Notes (Signed)
Patient ID: [place], female   DOB: 12-22-1940, 79 y.o.   MRN: [id number]  PROGRESS NOTE    Cristella Stiver Machia  YYT:[id number] DOB: 1940-07-27 DOA: 12/26/2019 PCP: Hoyt Koch, MD   Brief Narrative:  79 year old female with history of advanced dementia, Crohn's colitis, GERD, hypertension, chronic kidney disease stage III, unspecified depression, arthritis presented with generalized weakness, poor oral intake and pain all over.  In route, EMS noted fever of 101.2.  In the ED, patient had leukocytosis with slightly elevated creatinine from baseline along with positive fecal occult blood.  Imaging was notable for possible sigmoid diverticulitis along with pneumonia.  She was started on intravenous antibiotics.  Assessment & Plan:   Acute hypoxic respiratory failure -Patient respiratory status worsened overnight of 12/27/2019 and has continued to worsen.  She is currently on nonrebreather.  -She received few doses of intravenous Lasix on 12/28/2019 and is making some urine.  -Increase Lasix to 80 mg IV every 8 hours. -Strict input output.  Daily weights.  Chest x-ray from 12/28/2019 evening showed left basilar consolidation and moderate interstitial pulmonary edema.    Acute metabolic encephalopathy in a patient with advanced dementia -Probably multifactorial secondary to sepsis from pneumonia/colitis -Monitor mental status.  Fall precaution.  Delirium precautions.  Mental status is probably back to baseline.  Sepsis: Present on admission Community-acquired multilobar pneumonia with concern for aspiration as well Probable sigmoid diverticulitis -Rocephin and Flagyl were substituted with Unasyn on 12/28/2019 because of increased risk of gallbladder sludging with Rocephin.  Continue Unasyn and Zithromax -Cultures negative so far  Leukocytosis -Slightly improving at 19.1 today.  Monitor  History of Crohn's disease -Patient is on chronic steroids.  Continue.  No signs of flare on CT.   Outpatient follow-up with GI.  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  Creatinine 2.94 this morning.  Lasix plan as above.  Monitor -Renal ultrasound was negative for hydronephrosis.  Hypokalemia -Replace.  Repeat a.m. labs  Hypomagnesemia -Replace.  Repeat a.m. labs   Multilevel compression fractures in the thoracolumbar spine -Probably chronic.  Fall precautions.  Pain management.  PT recommended SNF placement.  Anemia of chronic disease -From chronic kidney disease.  Hemoglobin stable, 9.3 today.  Monitor -Fecal occult blood test is positive but there is no evidence of overt GI bleeding.  Doubt that patient has GI bleed.  Advanced dementia Depression -Fall precautions. -Delirium precautions.  Monitor mental status -Continue Prozac, Namenda, Remeron  Minimally elevated troponin -Troponins did not trend up.  EKG without ST depression or elevation.  No further work-up needed.  Outpatient follow-up  Hypertension  -Monitor blood pressure.  Continue amlodipine.  GERD -Continue PPI  Generalized deconditioning -Overall condition has worsened.  palliative care evaluation for goals of care discussion is still pending.  If condition does not improve in the next 24/48 hours, consider hospice/comfort measures   DVT prophylaxis: SCDs.  Holding chemical prophylaxis because of positive occult blood and anemia Code Status: DNR.   Family Communication: Spoke to son/Stanley on phone on 12/29/2019 Disposition Plan: Status is: Inpatient  Remains inpatient appropriate because:IV treatments appropriate due to intensity of illness or inability to take PO.  Overall condition has worsened.  Patient requiring nonrebreather at home.   Dispo: The patient is from: Home              Anticipated d/c is to: Unclear              Anticipated d/c  date is: Unclear              Patient currently is not medically stable to d/c.    Consultants:  Palliative care Procedures: None  Antimicrobials: Zithromax  from 12/26/2019 onwards Rocephin and Flagyl 12/26/2019-12/28/2019 Unasyn from 12/28/2019 onwards  Subjective: Patient seen and examined at bedside.  Poor historian.  Confused.  Currently on nonrebreather oxygen.  No overnight fever or vomiting reported.   Objective: Vitals:   12/28/19 2050 12/28/19 2140 12/29/19 0500 12/29/19 0504  BP:  (!) 156/91  (!) 163/89  Pulse:  73  80  Resp:  14  20  Temp:  98 F (36.7 C)  98.3 F (36.8 C)  TempSrc:  Oral  Oral  SpO2: 92% 95%  96%  Weight:   73.9 kg   Height:        Intake/Output Summary (Last 24 hours) at 12/29/2019 0733 Last data filed at 12/29/2019 0511 Gross per 24 hour  Intake 676.56 ml  Output 2000 ml  Net -1323.44 ml   Filed Weights   12/27/19 1426 12/29/19 0500  Weight: 74.6 kg 73.9 kg    Examination:  General exam: In mild distress secondary to dyspnea.  Currently on nonrebreather.  Looks chronically ill.   Respiratory system: Bilateral decreased breath sounds at bases with scattered crackles.  Intermittently tachypneic  cardiovascular system: S1-S2 heard, rate controlled  gastrointestinal system: Abdomen is nondistended, soft and slightly tender in the lower quadrant.  Bowel sounds are heard  extremities: Bilateral lower extremity edema present.  No cyanosis or clubbing. Central nervous system: Awake, confused no focal neurological deficits. Moving extremities Skin: No obvious petechiae or ulcers psychiatry: Could not be assessed because of mental status  Data Reviewed: I have personally reviewed following labs and imaging studies  CBC: Recent Labs  Lab 12/26/19 1324 12/26/19 1908 12/27/19 0406 12/28/19 0536 12/29/19 0602  WBC 20.8* 15.5* 13.1* 22.3* 19.1*  NEUTROABS 19.9*  --   --  18.1* 15.4*  HGB 10.5* 9.3* 9.0* 9.3* 8.4*  HCT 31.8* 28.5* 27.5* 28.9* 26.7*  MCV 99.1 100.0 99.6 101.4* 101.5*  PLT 422* 364 359 404* 734   Basic Metabolic Panel: Recent  Labs  Lab 12/26/19 1324 12/26/19 1324 12/26/19 1908 12/27/19 0406 12/28/19 0536 12/28/19 1335 12/29/19 0602  NA 141  --   --  142 142 142 142  K 2.9*  --   --  3.7 3.8 4.5 3.4*  CL 104  --   --  110 110 109 106  CO2 23  --   --  22 21* 20* 21*  GLUCOSE 138*  --   --  79 94 131* 74  BUN 69*  --   --  60* 61* 59* 56*  CREATININE 3.06*   < > 2.99* 2.76* 2.89* 3.02* 2.94*  CALCIUM 7.9*  --   --  7.8* 8.0* 8.0* 8.2*  MG  --   --   --   --  1.5*  --  1.4*   < > = values in this interval not displayed.   GFR: Estimated Creatinine Clearance: 16.2 mL/min (A) (by C-G formula based on SCr of 2.94 mg/dL (H)). Liver Function Tests: Recent Labs  Lab 12/26/19 1324 12/27/19 0406 12/28/19 0536 12/29/19 0602  AST 17 14* 16 16  ALT 11 10 10 10   ALKPHOS 65 55 59 53  BILITOT 0.7 0.4 0.6 0.6  PROT 6.9 6.1* 6.5 5.9*  ALBUMIN 3.3* 3.0* 3.1* 2.9*   No results for  input(s): LIPASE, AMYLASE in the last 168 hours. No results for input(s): AMMONIA in the last 168 hours. Coagulation Profile: Recent Labs  Lab 12/26/19 1324  INR 0.9   Cardiac Enzymes: No results for input(s): CKTOTAL, CKMB, CKMBINDEX, TROPONINI in the last 168 hours. BNP (last 3 results) No results for input(s): PROBNP in the last 8760 hours. HbA1C: No results for input(s): HGBA1C in the last 72 hours. CBG: No results for input(s): GLUCAP in the last 168 hours. Lipid Profile: No results for input(s): CHOL, HDL, LDLCALC, TRIG, CHOLHDL, LDLDIRECT in the last 72 hours. Thyroid Function Tests: No results for input(s): TSH, T4TOTAL, FREET4, T3FREE, THYROIDAB in the last 72 hours. Anemia Panel: No results for input(s): VITAMINB12, FOLATE, FERRITIN, TIBC, IRON, RETICCTPCT in the last 72 hours. Sepsis Labs: Recent Labs  Lab 12/26/19 1324 12/26/19 1548  LATICACIDVEN 1.1 1.0    Recent Results (from the past 240 hour(s))  Urine culture     Status: None   Collection Time: 12/26/19  1:25 PM   Specimen: Urine, Random  Result  Value Ref Range Status   Specimen Description   Final    URINE, RANDOM Performed at [facility] [address] [place], [place] [address]    Special Requests   Final    NONE Performed at [facility], [place] [address], [place], [place] [address]    Culture   Final    NO GROWTH Performed at [facility], [place] [address], [place], [place] [address]    Report Status 12/27/2019 FINAL  Final  Culture, blood (routine x 2)     Status: None (Preliminary result)   Collection Time: 12/26/19  1:25 PM   Specimen: BLOOD RIGHT FOREARM  Result Value Ref Range Status   Specimen Description   Final    BLOOD RIGHT FOREARM Performed at [facility] [place] [address], [place], [place] [address]    Special Requests   Final    BOTTLES DRAWN AEROBIC AND ANAEROBIC Blood Culture results may not be optimal due to an inadequate volume of blood received in culture bottles Performed at [place] [address], [place], [place] [address]    Culture   Final    NO GROWTH 3 DAYS Performed at [facility], [place] [address], [place], [place] [address]    Report Status PENDING  Incomplete  SARS Coronavirus 2 by RT PCR (hospital order, performed in [facility] hospital lab) Nasopharyngeal Nasopharyngeal Swab     Status: None   Collection Time: 12/26/19  1:28 PM   Specimen: Nasopharyngeal Swab  Result Value Ref Range Status   SARS Coronavirus 2 NEGATIVE NEGATIVE Final    Comment: (NOTE) SARS-CoV-2 target nucleic acids are NOT DETECTED. The SARS-CoV-2 RNA is generally detectable in upper and lower respiratory specimens during the acute phase of infection. The lowest concentration of SARS-CoV-2 viral copies this assay can detect is 250 copies / mL. A negative result does not preclude SARS-CoV-2 infection and should not be used as the sole basis for treatment or other patient management decisions.  A negative result may occur  with improper specimen collection / handling, submission of specimen other than nasopharyngeal swab, presence of viral mutation(s) within the areas targeted by this assay, and inadequate number of viral copies (<250 copies / mL). A negative result must be combined with clinical observations, patient history, and epidemiological information. Fact Sheet for Patients:   StrictlyIdeas.no Fact Sheet for Healthcare Providers:  BankingDealers.co.za This test is not yet approved or cleared  by the [place] and has been authorized for detection and/or diagnosis of SARS-CoV-2 by FDA under an Emergency Use Authorization (EUA).  This EUA will remain in effect (meaning this test can be used) for the duration of the COVID-19 declaration under Section 564(b)(1) of the Act, 21 U.S.C. section 360bbb-3(b)(1), unless the authorization is terminated or revoked sooner. Performed at [facility], [place] [address] [place], [place] [address]   Culture, blood (routine x 2)     Status: None (Preliminary result)   Collection Time: 12/26/19  1:30 PM   Specimen: BLOOD LEFT FOREARM  Result Value Ref Range Status   Specimen Description   Final    BLOOD LEFT FOREARM Performed at [place] [address] [place], [place] [address]    Special Requests   Final    BOTTLES DRAWN AEROBIC AND ANAEROBIC Blood Culture adequate volume Performed at [place] [address], [place], [place] [address]    Culture   Final    NO GROWTH 3 DAYS Performed at [facility], [place] [address], [place], [place] [address]    Report Status PENDING  Incomplete         Radiology Studies: DG Chest 1 View  Result Date: 12/28/2019 CLINICAL DATA:  Respiratory distress EXAM: CHEST  1 VIEW COMPARISON:  12/28/2019 at 2:52 a.m. FINDINGS: Unchanged cardiomegaly with moderate interstitial pulmonary edema. Left basilar  consolidation is unchanged. IMPRESSION: Unchanged cardiomegaly, left basilar consolidation and moderate interstitial pulmonary edema. Electronically Signed   By: Ulyses Jarred M.D.   On: 12/28/2019 19:49   US RENAL  Result Date: 12/28/2019 CLINICAL DATA:  Acute renal failure. EXAM: RENAL / URINARY TRACT ULTRASOUND COMPLETE COMPARISON:  None. FINDINGS: Right Kidney: Renal measurements: 11.1 x 4.4 x 4.9 cm = volume: 125.7 mL. Mild increased renal parenchymal echogenicity. Mild renal cortical thinning. Mild dilation of the renal pelvis without caliectasis. No masses or stones. Left Kidney: Renal measurements: 9.8 x 4.5 x 4.4 cm = volume: 102.0 mL. Mild increased renal parenchymal echogenicity. Upper pole cyst measuring 2.1 x 2.3 x 2.5 cm. No other masses, no stones and no hydronephrosis. Bladder: Appears normal for degree of bladder distention. Other: Right pleural effusion. IMPRESSION: 1. Mild dilation of the right renal pelvis without caliectasis. No convincing hydronephrosis. 2. Mild bilateral increased renal parenchymal echogenicity consistent with medical renal disease. 3. Left renal cyst. Electronically Signed   By: Lajean Manes M.D.   On: 12/28/2019 15:19   DG CHEST PORT 1 VIEW  Result Date: 12/28/2019 CLINICAL DATA:  Shortness of breath. EXAM: PORTABLE CHEST 1 VIEW COMPARISON:  Radiograph and CT 2 days ago FINDINGS: Stable cardiomegaly. Progression and right greater than left interstitial perihilar opacities suspicious for pulmonary edema. Increased retrocardiac opacity likely combination of pleural fluid and atelectasis/airspace disease. Small right pleural effusion. No pneumothorax. Vertebroplasty in the midthoracic spine. IMPRESSION: 1. Progression of right greater than left perihilar interstitial opacities, pulmonary edema versus atypical pneumonia. 2. Increased retrocardiac opacity likely combination of pleural fluid and atelectasis/airspace disease. Cardiomegaly is stable. Electronically Signed    By: Keith Rake M.D.   On: 12/28/2019 03:21   DG Abd Portable 2V  Result Date: 12/28/2019 CLINICAL DATA:  Abdominal pain EXAM: PORTABLE ABDOMEN - 2 VIEW COMPARISON:  CT from 12/26/2019 FINDINGS: Scattered large and small bowel gas is noted. Changes of diverticulosis are noted. No free air is seen. No abnormal  mass or abnormal calcifications are noted. Postsurgical changes are seen. IMPRESSION: No acute abnormality noted. Electronically Signed   By: Inez Catalina M.D.   On: 12/28/2019 10:26        Scheduled Meds: . amLODipine  10 mg Oral Daily  . B-complex with vitamin C  1 tablet Oral Daily  . calcitonin (salmon)  1 spray Alternating Nares Daily  . FLUoxetine  20 mg Oral Daily  . memantine  10 mg Oral BID  . mirtazapine  15 mg Oral QHS  . pantoprazole (PROTONIX) IV  40 mg Intravenous QHS  . predniSONE  20 mg Oral Q breakfast  . simvastatin  10 mg Oral q1800  . ursodiol  300 mg Oral BID   Continuous Infusions: . sodium chloride 250 mL (12/29/19 0526)  . ampicillin-sulbactam (UNASYN) IV 3 g (12/29/19 0527)  . azithromycin 500 mg (12/28/19 1646)          Aline August, MD [facility] 12/29/2019, 7:33 AM

## 2019-12-29 NOTE — Progress Notes (Signed)
Swallow evaluation completed.  Full report to follow. No focal CN deficits observed regarding swallow musculature and pt denies swallowing difficulties or coughing with intake.  Pt did not pass 3 ounce Yale water test due to overtly coughing with swallow attempt - suspect inhalation of water after 1.5 ounces swallowed due to breath hunger.  SLP advised she stop water intake if needed prior to trial.  Suspect pt aspirated water due to inhalation but silent aspiration can not be ruled out due failing 3 ounce water test.  No indication of aspiration or dysphagia with small boluses of thin via straw/cup, eggs/potatoes nor grits.  Swallow clinically appeared timely with observation of laryngeal closure.   Contacted MD regarding pt's care plan - note palliative referral pending.  At this time, plan is to hold on instrumental evaluation *MBS* until after palliative meeting to establish pt's [place].  In the interim, recommend continue diet - advise medications with puree to mitigate aspiration.  Will follow up next date. Thanks.  Kathleen Lime, MS [place] Office [phone]

## 2019-12-29 NOTE — Consult Note (Signed)
Consultation Note Date: 12/29/2019   Patient Name: [name] [name]  DOB: [birth year]  MRN: [id number]  Age / Sex: 79 y.o., female  PCP: Hoyt Koch, MD Referring Physician: Aline August, MD  Reason for Consultation: Establishing goals of care  HPI/Patient Profile: 79 y.o. female  with past medical history of dementia, Chron's, GERD, HTN, CKD, depression, and arthritis admitted on 12/26/2019 with weakness and poor PO intake. Patient treated for sepsis d/t CAP vs sigmoid diverticulitis. Patient also with acute respiratory failure - CXR reveals left basilar consolidation and pulmonary edema. Condition has worsened throughout hospitalization and PMT consulted to discuss [place].   Clinical Assessment and Goals of Care: I have reviewed medical records including EPIC notes, labs and imaging, received report RN, assessed the patient and then spoke with patient's son, [name] [name], to discuss diagnosis prognosis, [place], EOL wishes, disposition and options.  I first met with patient - she is interactive but confused. Tells me she is so sick because of her Crohn's and just wants to go home and take care of herself. She tells me she lives with her son [name] [name] and he makes her decisions for her. She tells me it is okay to call [name] [name] and discuss her care with him.   I called [name] [name] and had the following conversation:  I introduced Palliative Medicine as specialized medical care for people living with serious illness. It focuses on providing relief from the symptoms and stress of a serious illness. The goal is to improve quality of life for both the patient and the family.  As far as functional and nutritional status, [name] [name] tells me that patient has been using a walker but she ambulates well. He tells me she has a very poor appetite. He also tells me of worsening confusion and forgetfulness. She usually focuses on the past but does not remember current  situations.   We discussed patient's current illness and what it means in the larger context of patient's on-going co-morbidities.  Natural disease trajectory and expectations at EOL were discussed. [name] [name] has a good understanding of the situation and tells me he knows she is declining. We discussed Ms. Bales's dependence on high amounts of oxygen and decline throughout hospitalization.   I attempted to elicit values and goals of care important to the patient.  [name] [name] tells me about Ms. Kalmar's desire to not be resuscitated. We discussed that this is appropriate given her baseline frailty. [name] [name] also tells me that Ms. Bai has been telling him over the past few weeks that she was ready to pass away.   The difference between aggressive medical intervention and comfort care was considered in light of the patient's goals of care. We discuss continuing current care but if Ms. Dasher declines, shifting focus to full comfort measures - [name] [name] agrees to this. We also discuss that if Ms. Wirthlin does not improve despite current interventions it may be most appropriate to shift to comfort care - [name] [name] agrees with this as well.   Discussed with [name] [name] the importance of continued conversation with family and the medical providers regarding overall plan of care and treatment options, ensuring decisions are within the context of the patient's values and GOCs.    Hospice and Palliative Care services outpatient were explained and offered. Discussed hospice care at home vs hospice facility if Ms. Holloman does not improve. [name] [name] does not feel that he is able to provide Ms. Zwick's care at home any longer, even with the support of hospice. If Ms. Fernholz  improves, [name] [name] is interested in SNF placement.   Questions and concerns were addressed. The family was encouraged to call with questions or concerns.   Primary Decision Maker NEXT OF KIN - son [employer]   SUMMARY OF  RECOMMENDATIONS    - continue current measures but family would agree to full comfort measures if patient declines further - would NOT want transfer to ICU - if no improvement or continued decline in coming days - family would agree to transition full comfort measures - discussed hospice facility vs SNF if stabilizes  Code Status/Advance Care Planning:  DNR  Discharge Planning: To Be Determined      Primary Diagnoses: Present on Admission: . Acute metabolic encephalopathy . CKD (chronic kidney disease) . Crohn's disease of colon with rectal bleeding ([place]) . Dementia ([place]) . Hyperglycemia . Esophageal reflux . Hypertension . Severe major depression ([place])   I have reviewed the medical record, interviewed the patient and family, and examined the patient. The following aspects are pertinent.  Past Medical History:  Diagnosis Date  . Allergy    year around allergies   . Arthritis    shoulder  . Bell's palsy   . Blood transfusion    1966  . Blood transfusion without reported diagnosis   . Cataract    bilaterally removed  . Colon polyps    adenomatous polyp June 2012  . Complication of anesthesia    slow to wake  . Crohn's colitis ([place])   . Dry eyes    left eye worst one  . GERD (gastroesophageal reflux disease)   . Headache(784.0)    hx migraines  . Hypertension   . Lumbar compression fracture, closed, initial encounter ([place]) 06/02/2019  . Protein-calorie malnutrition ([place]) 03/06/2019  . Severe major depression ([place]) 03/03/2019  . Shortness of breath    Social History   Socioeconomic History  . Marital status: Widowed    Spouse name: Not on file  . Number of children: 3  . Years of education: Not on file  . Highest education level: Not on file  Occupational History  . Occupation: retired     Fish farm manager: RETIRED  Tobacco Use  . Smoking status: Former Smoker    Packs/day: 0.25    Years: 10.00    Pack years: 2.50    Types: Cigarettes    Quit date: 09/26/1968     Years since quitting: 51.2  . Smokeless tobacco: Never Used  Substance and Sexual Activity  . Alcohol use: Not Currently    Alcohol/week: 0.0 standard drinks  . Drug use: No  . Sexual activity: Not Currently  Other Topics Concern  . Not on file  Social History Narrative   The patient is retired and widowed.  She has 3 children and grandchildren.   Second home in [place] she does not go there much anymore.   She is a former smoker but does not use alcohol or tobacco or drugs.   4 dogs   Lives with her son and his wife   Right handed   12th grade education   Social Determinants of Health   Financial Resource Strain: Low Risk   . Difficulty of Paying Living Expenses: Not hard at all  Food Insecurity: No Food Insecurity  . Worried About Charity fundraiser in the Last Year: Never true  . Ran Out of Food in the Last Year: Never true  Transportation Needs: No Transportation Needs  . Lack of Transportation (Medical): No  .  Lack of Transportation (Non-Medical): No  Physical Activity: Inactive  . Days of Exercise per Week: 0 days  . Minutes of Exercise per Session: 0 min  Stress: No Stress Concern Present  . Feeling of Stress : Not at all  Social Connections: Unknown  . Frequency of Communication with Friends and Family: More than three times a week  . Frequency of Social Gatherings with Friends and Family: More than three times a week  . Attends Religious Services: 1 to 4 times per year  . Active Member of Clubs or Organizations: Not on file  . Attends Archivist Meetings: Not on file  . Marital Status: Widowed   Family History  Problem Relation Age of Onset  . Leukemia Mother   . Heart disease Mother   . Heart disease Father   . Heart attack Father   . Ovarian cancer Paternal Aunt        1/2 aunt  . Breast cancer Paternal Aunt        1/2 aunt  . Throat cancer Paternal Aunt        1/2 aunt  . Parkinsonism Brother   . Heart disease Brother   . Leukemia  Maternal Grandfather   . Anesthesia problems Neg Hx   . Hypotension Neg Hx   . Malignant hyperthermia Neg Hx   . Pseudochol deficiency Neg Hx   . Colon cancer Neg Hx   . Diabetes Neg Hx   . Esophageal cancer Neg Hx   . Rectal cancer Neg Hx   . Stomach cancer Neg Hx    Scheduled Meds: . amLODipine  10 mg Oral Daily  . B-complex with vitamin C  1 tablet Oral Daily  . calcitonin ([place])  1 spray Alternating Nares Daily  . FLUoxetine  20 mg Oral Daily  . furosemide  80 mg Intravenous Q8H  . memantine  10 mg Oral BID  . mirtazapine  15 mg Oral QHS  . pantoprazole (PROTONIX) IV  40 mg Intravenous QHS  . predniSONE  20 mg Oral Q breakfast  . simvastatin  10 mg Oral q1800  . ursodiol  300 mg Oral BID   Continuous Infusions: . sodium chloride 250 mL (12/29/19 0526)  . ampicillin-sulbactam (UNASYN) IV 3 g (12/29/19 0527)  . azithromycin 500 mg (12/28/19 1646)   PRN Meds:.sodium chloride, acetaminophen **OR** acetaminophen, metoprolol tartrate, ondansetron **OR** ondansetron (ZOFRAN) IV, oxyCODONE, senna-docusate, sodium chloride Allergies  Allergen Reactions  . Azathioprine Nausea Only    Elevated lipase also - ? Pancreatitis   . Entyvio [Vedolizumab] Shortness Of Breath  . Adhesive [Tape] Rash    Taking skin off  . Sulfa Antibiotics Itching and Rash   Review of Systems  Unable to perform ROS: Dementia    Physical Exam Constitutional:      General: She is not in acute distress. Pulmonary:     Comments: On NRB and [place], no tachypnea or accessory muscle use Musculoskeletal:     Right lower leg: Edema present.     Left lower leg: Edema present.  Skin:    General: Skin is warm and dry.  Neurological:     Mental Status: She is alert. She is disoriented.     Vital Signs: BP 140/75 (BP Location: Left Arm)   Pulse 82   Temp 98.9 F (37.2 C) (Oral)   Resp (!) 24   Ht 5' 6" (1.676 m)   Wt 73.9 kg   SpO2 (!) 88% Comment: RN notified  BMI  26.30 kg/m  Pain Scale: 0-10    Pain Score: 0-No pain   SpO2: SpO2: (!) 88 %(RN notified) O2 Device:SpO2: (!) 88 %(RN notified) O2 Flow Rate: .O2 Flow Rate (L/min): 15 L/min  IO: Intake/output summary:   Intake/Output Summary (Last 24 hours) at 12/29/2019 1306 Last data filed at 12/29/2019 1132 Gross per 24 hour  Intake 1016.56 ml  Output 1700 ml  Net -683.44 ml    LBM: Last BM Date: 12/28/19 Baseline Weight: Weight: 74.6 kg Most recent weight: Weight: 73.9 kg     Palliative Assessment/Data: PPS 20%    Time Total: 50 minutes Greater than 50%  of this time was spent counseling and coordinating care related to the above assessment and plan.  Juel Burrow, DNP, AGNP-C Palliative Medicine Team [phone] Pager: [phone]

## 2019-12-29 NOTE — Plan of Care (Signed)
  Problem: Pain Managment: Goal: General experience of comfort will improve Outcome: Progressing  Patient received tylenol which relieved her pain. Problem: Safety: Goal: Ability to remain free from injury will improve Outcome: Progressing  Patient in bed with side rails x 4 and bed alarm on.

## 2019-12-29 NOTE — Progress Notes (Signed)
Patient requiring non-rebreather when sleeping to maintain sats 88%-92%.  Patient reports she "feel heavy."  Dr. Starla Link notified via text page.

## 2019-12-29 NOTE — Care Management Important Message (Signed)
Important Message  Patient Details IM Letter given to Evette Cristal SW Case Manager to present to the Patient Name: [name] [name] MRN: [id number] Date of Birth: [birth year]   Medicare Important Message Given:  Yes     Kerin Salen 12/29/2019, 1:15 PM

## 2019-12-29 NOTE — Telephone Encounter (Signed)
Spoke to son and reviewed her situation. Agree that she may need hospice if clinical status fails to start improving.

## 2019-12-29 NOTE — Progress Notes (Signed)
  Echocardiogram 2D Echocardiogram has been performed.  Jennette Dubin 12/29/2019, 9:09 AM

## 2019-12-30 DIAGNOSIS — Z515 Encounter for palliative care: Secondary | ICD-10-CM

## 2019-12-30 LAB — COMPREHENSIVE METABOLIC PANEL
ALT: 10 U/L (ref 0–44)
AST: 18 U/L (ref 15–41)
Albumin: 3.1 g/dL — ABNORMAL LOW (ref 3.5–5.0)
Alkaline Phosphatase: 54 U/L (ref 38–126)
Anion gap: 12 (ref 5–15)
BUN: 53 mg/dL — ABNORMAL HIGH (ref 8–23)
CO2: 22 mmol/L (ref 22–32)
Calcium: 8.5 mg/dL — ABNORMAL LOW (ref 8.9–10.3)
Chloride: 104 mmol/L (ref 98–111)
Creatinine, Ser: 2.95 mg/dL — ABNORMAL HIGH (ref 0.44–1.00)
GFR calc Af Amer: 17 mL/min — ABNORMAL LOW (ref >60)
GFR calc non Af Amer: 15 mL/min — ABNORMAL LOW (ref >60)
Glucose, Bld: 99 mg/dL (ref 70–99)
Potassium: 3.4 mmol/L — ABNORMAL LOW (ref 3.5–5.1)
Sodium: 138 mmol/L (ref 135–145)
Total Bilirubin: 0.5 mg/dL (ref 0.3–1.2)
Total Protein: 6.5 g/dL (ref 6.5–8.1)

## 2019-12-30 LAB — CBC WITH DIFFERENTIAL/PLATELET
Abs Immature Granulocytes: 0.57 10*3/uL — ABNORMAL HIGH (ref 0.00–0.07)
Basophils Absolute: 0.1 10*3/uL (ref 0.0–0.1)
Basophils Relative: 0 %
Eosinophils Absolute: 0 10*3/uL (ref 0.0–0.5)
Eosinophils Relative: 0 %
HCT: 27.7 % — ABNORMAL LOW (ref 36.0–46.0)
Hemoglobin: 9 g/dL — ABNORMAL LOW (ref 12.0–15.0)
Immature Granulocytes: 2 %
Lymphocytes Relative: 5 %
Lymphs Abs: 1.2 10*3/uL (ref 0.7–4.0)
MCH: 32.7 pg (ref 26.0–34.0)
MCHC: 32.5 g/dL (ref 30.0–36.0)
MCV: 100.7 fL — ABNORMAL HIGH (ref 80.0–100.0)
Monocytes Absolute: 1.4 10*3/uL — ABNORMAL HIGH (ref 0.1–1.0)
Monocytes Relative: 6 %
Neutro Abs: 21.7 10*3/uL — ABNORMAL HIGH (ref 1.7–7.7)
Neutrophils Relative %: 87 %
Platelets: 379 10*3/uL (ref 150–400)
RBC: 2.75 MIL/uL — ABNORMAL LOW (ref 3.87–5.11)
RDW: 12.5 % (ref 11.5–15.5)
WBC: 24.9 10*3/uL — ABNORMAL HIGH (ref 4.0–10.5)
nRBC: 0 % (ref 0.0–0.2)

## 2019-12-30 LAB — MAGNESIUM: Magnesium: 2 mg/dL (ref 1.7–2.4)

## 2019-12-30 MED ORDER — HYDROMORPHONE HCL 1 MG/ML IJ SOLN
0.2500 mg | INTRAMUSCULAR | Status: DC | PRN
  Administered 2019-12-30: 0.25 mg via INTRAVENOUS
  Filled 2019-12-30 (×2): qty 0.5

## 2019-12-30 MED ORDER — HALOPERIDOL 0.5 MG PO TABS
0.5000 mg | ORAL_TABLET | ORAL | Status: DC | PRN
  Filled 2019-12-30: qty 1

## 2019-12-30 MED ORDER — PROMETHAZINE HCL 25 MG/ML IJ SOLN
12.5000 mg | Freq: Once | INTRAMUSCULAR | Status: AC
  Administered 2019-12-30: 12.5 mg via INTRAVENOUS
  Filled 2019-12-30: qty 1

## 2019-12-30 MED ORDER — HALOPERIDOL LACTATE 2 MG/ML PO CONC
0.5000 mg | ORAL | Status: DC | PRN
  Filled 2019-12-30: qty 0.3

## 2019-12-30 MED ORDER — POTASSIUM CHLORIDE 10 MEQ/100ML IV SOLN
10.0000 meq | INTRAVENOUS | Status: DC
  Administered 2019-12-30 (×2): 10 meq via INTRAVENOUS
  Filled 2019-12-30 (×2): qty 100

## 2019-12-30 MED ORDER — HALOPERIDOL LACTATE 5 MG/ML IJ SOLN: 0.5000 mg | INTRAMUSCULAR | Status: DC | PRN

## 2019-12-30 MED ORDER — BIOTENE DRY MOUTH MT LIQD: 15.0000 mL | OROMUCOSAL | Status: DC | PRN

## 2019-12-30 NOTE — TOC Transition Note (Addendum)
Transition of Care [facility]) - CM/SW Discharge Note   Patient Details  Name: [name] [name] MRN: [id number] Date of Birth: [birth year]  [facility] CM/SW Contact:  Ross Ludwig, LCSW Phone Number: 12/30/2019, 12:40 PM   Clinical Narrative:     Patient to be d/c'ed today to [place].  Patient and family agreeable to plans will transport via ems RN to call report to [phone].  CSW was informed that [facility] can accept patient anytime after 2:30pm, CSW scheduled EMS for around 3pm.  Patient's son is aware that patient is discharging today.  Final next level of care: [place] Barriers to Discharge: Barriers Resolved   Patient Goals and CMS Choice Patient states their goals for this hospitalization and ongoing recovery are:: To go to [facility] for end of life care. CMS Medicare.gov Compare Post Acute Care list provided to:: Patient Represenative (must comment) Choice offered to / list presented to : Adult Children  Discharge Placement  Patient will be going to [place] for end of life care.            Patient chooses bed at: Other - please specify in the comment section below:([facility] [address] [place], [place], [address]) Patient to be transferred to facility by: [facility] Name of family member notified: Son [name] Patient and family notified of of transfer: 12/30/19  Discharge Plan and Services In-house Referral: Clinical Social Work, Hospice / Palliative Care   Post Acute Care Choice: Hospice                               Social Determinants of Health (SDOH) Interventions     Readmission Risk Interventions Readmission Risk Prevention Plan 12/30/2019  PCP or Specialist Appt within 3-5 Days Complete  HRI or [place] Not Complete  HRI or Home Care Consult comments Patient going to Hospice facility for end of life care.  Social Work Consult for [place] Planning/Counseling  Not Complete  SW consult not completed comments Patient going to Hospice facility for end of life care.  Palliative Care Screening Complete  Medication Review (RN Care Manager) Referral to Pharmacy  Some recent data might be hidden

## 2019-12-30 NOTE — Progress Notes (Signed)
Chaplain visited when patient was alert and oriented, and conversant.  Son [name] [name] was bedside with her.  She has been living with him, and loves his dogs. [name] [name] has two brothers but has been the primary caregiver for his mother since his father's death 7 years ago.  Speaking of her going into hospice, he said "we knew this was coming.  She understands."  [name] [name] himself is still recovering from stents placed in heart at [facility] last week.  "This isn't our first rodeo."  "It's gonna be a change having at hospice."  He speaks with admiration and love of his mother, as well as acceptance of her medical issues over the years.  [place] of her. It's in the Lord's hands." Chaplain offered ministry of presence and prayer. Rev. Tamsen Snider Pager [phone]

## 2019-12-30 NOTE — Discharge Summary (Signed)
Physician Discharge Summary  [place] QQP:[id number] DOB: 07/24/1940 DOA: 12/26/2019  PCP: Hoyt Koch, MD  Admit date: 12/26/2019 Discharge date: 12/30/2019  Admitted From: Home Disposition:  Home  Discharge Condition:Guarded CODE STATUS:Comfort Care  Brief/Interim Summary:  79 year old female with history of advanced dementia, Crohn's colitis, GERD, hypertension, chronic kidney disease stage III, unspecified depression, arthritis presented with generalized weakness, poor oral intake and pain all over.  In route, EMS noted fever of 101.2.  In the ED, patient had leukocytosis with slightly elevated creatinine from baseline along with positive fecal occult blood.  Imaging was notable for possible sigmoid diverticulitis along with pneumonia.  She was started on intravenous antibiotics.  Patient continued to require increased oxygen supplementation for maintenance of saturation.  Due to her advanced age and comorbidities, palliative care was consulted for goals of care.  Since her respiratory status is worsening, family discussion was held and the plan is to transfer to residential hospice.  Following problems were addressed during her hospitalization:   Acute hypoxic respiratory failure -Patient respiratory status worsened overnight of 12/27/2019 and has continued to worsen.  She is currently on nonrebreather.  -She was on  intravenous Lasix Chest x-ray from 12/28/2019 evening showed left basilar consolidation and moderate interstitial pulmonary edema.    Acute metabolic encephalopathy in a patient with advanced dementia -Probably multifactorial secondary to sepsis from pneumonia/colitis  Sepsis: Present on admission Community-acquired multilobar pneumonia with concern for aspiration as well Probable sigmoid diverticulitis -Rocephin and Flagyl were substituted with Unasyn on 12/28/2019 because of increased risk of gallbladder sludging with Rocephin.  -Cultures negative so  far  History of Crohn's disease -Patient is on chronic steroids. No signs of flare on CT.  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   -Renal ultrasound was negative for hydronephrosis.  Multilevel compression fractures in the thoracolumbar spine -Probably chronic.   Anemia of chronic disease -From chronic kidney disease.  Hemoglobin stable -Fecal occult blood test is positive but there is no evidence of overt GI bleeding.  Doubt that patient has GI bleed.  Advanced dementia Depression -Fall precautions. -Delirium precautions -On Prozac, Namenda, Remeron  Minimally elevated troponin -Troponins did not trend up.  EKG without ST depression or elevation.  No further work-up needed.  Hypertension  -Monitor blood pressure.  On amlodipine.   Discharge Diagnoses:  Principal Problem:   Acute metabolic encephalopathy Active Problems:   Crohn's disease of colon with rectal bleeding ([place])   Hypertension   Long term (current) use of systemic steroids-prednisone   Esophageal reflux   Hyperglycemia   Dementia (HCC)   Severe major depression (HCC)   CKD (chronic kidney disease)   Acute respiratory failure with hypoxia (HCC)   Goals of care, counseling/discussion   Palliative care by specialist   DNR (do not resuscitate)   Comfort measures only status    Discharge Instructions   Allergies as of 12/30/2019      Reactions   Azathioprine Nausea Only   Elevated lipase also - ? Pancreatitis   Entyvio [vedolizumab] Shortness Of Breath   Adhesive [tape] Rash   Taking skin off   Sulfa Antibiotics Itching, Rash      Medication List    STOP taking these medications   acetaminophen 500 MG tablet Commonly known as: TYLENOL   amLODipine 10 MG tablet Commonly known as: NORVASC   calcitonin (salmon) 200 UNIT/ACT nasal spray Commonly known as: MIACALCIN/FORTICAL   FLUoxetine 20 MG capsule Commonly  known as: PROZAC    hydrochlorothiazide 25 MG tablet Commonly known as: HYDRODIURIL   memantine 10 MG tablet Commonly known as: NAMENDA   mirtazapine 15 MG tablet Commonly known as: Remeron   predniSONE 10 MG tablet Commonly known as: DELTASONE   simvastatin 10 MG tablet Commonly known as: ZOCOR   sodium chloride 0.65 % Soln nasal spray Commonly known as: OCEAN   ursodiol 300 MG capsule Commonly known as: ACTIGALL       Allergies  Allergen Reactions  . Azathioprine Nausea Only    Elevated lipase also - ? Pancreatitis   . Entyvio [Vedolizumab] Shortness Of Breath  . Adhesive [Tape] Rash    Taking skin off  . Sulfa Antibiotics Itching and Rash    Consultations:  Palliative care,GI   Procedures/Studies: DG Chest 1 View  Result Date: 12/28/2019 CLINICAL DATA:  Respiratory distress EXAM: CHEST  1 VIEW COMPARISON:  12/28/2019 at 2:52 a.m. FINDINGS: Unchanged cardiomegaly with moderate interstitial pulmonary edema. Left basilar consolidation is unchanged. IMPRESSION: Unchanged cardiomegaly, left basilar consolidation and moderate interstitial pulmonary edema. Electronically Signed   By: Ulyses Jarred M.D.   On: 12/28/2019 19:49   CT Head Wo Contrast  Result Date: 12/26/2019 CLINICAL DATA:  Encephalopathy. Unable to ambulate today. Reduced oral intake. Urinary incontinence. Malodorous urine. EXAM: CT HEAD WITHOUT CONTRAST TECHNIQUE: Contiguous axial images were obtained from the base of the skull through the vertex without intravenous contrast. COMPARISON:  08/31/2009 FINDINGS: Brain: The brainstem, cerebellum, cerebral peduncles, thalami, basal ganglia, basilar cisterns, and ventricular system appear within normal limits. No intracranial hemorrhage, mass lesion, or acute CVA. Periventricular white matter and corona radiata hypodensities favor chronic ischemic microvascular white matter disease. Vascular: Unremarkable Skull: Unremarkable Sinuses/Orbits: Unremarkable Other: No supplemental  non-categorized findings. IMPRESSION: 1. No acute intracranial findings. 2. Periventricular white matter and corona radiata hypodensities favor chronic ischemic microvascular white matter disease. Electronically Signed   By: Van Clines M.D.   On: 12/26/2019 15:48   CT Chest Wo Contrast  Result Date: 12/26/2019 CLINICAL DATA:  Right upper lung opacity chest radiography for further characterization. Left lung opacities also noted. Altered mental status. EXAM: CT CHEST, ABDOMEN AND PELVIS WITHOUT CONTRAST TECHNIQUE: Multidetector CT imaging of the chest, abdomen and pelvis was performed following the standard protocol without IV contrast. COMPARISON:  Multiple exams, including chest radiograph from 12/26/2019 and CT abdomen from 01/27/2019 FINDINGS: CT CHEST FINDINGS Cardiovascular: Coronary, aortic arch, and branch vessel atherosclerotic vascular disease. Mild cardiomegaly. Mediastinum/Nodes: Unremarkable Lungs/Pleura: Scattered scarring in the lungs. Confluent ground-glass opacities inferiorly in the right upper lobe and in the right middle lobe. Less degree of hazy ground-glass opacity posteriorly in the left upper lobe. Old granulomatous disease. Small left and trace right pleural effusion. Musculoskeletal: Small ossicles in the bicipital groove likely represent free osteochondral fragments in the right biceps recess of the glenohumeral joint. Similar findings on the left. Prior vertebral augmentations at T7 and T9. Age indeterminate endplate compression fractures at T8, T11, T12, and L1. CT ABDOMEN PELVIS FINDINGS Hepatobiliary: Mild lobularity of the a hepatic contour could reflect early cirrhosis. Cholecystectomy. No appreciable significant degree of biliary dilatation. Pancreas: Unremarkable Spleen: Unremarkable Adrenals/Urinary Tract: Both adrenal glands appear normal. Low-density lobularity from the left kidney upper pole, probably a cyst although technically nonspecific, measuring about 3.1 by 1.5  cm, previously measuring about 2.3 by 1.4 cm on 01/27/2019. No urinary tract calculi, hydronephrosis, or hydroureter. Urinary bladder unremarkable, without wall thickening. Stomach/Bowel: Periampullary duodenal diverticulum. Descending and sigmoid colon  diverticulosis. There is some stranding along the margin of the proximal sigmoid colon on image 87/4 suggesting mild acute diverticulitis. Vascular/Lymphatic: Aortoiliac atherosclerotic vascular disease. Reproductive: Uterus absent.  Adnexa unremarkable. Other: Trace free pelvic fluid, etiology uncertain. Musculoskeletal: Postoperative findings in the lower lumbar spine. Grade 1 degenerative anterolisthesis at L4-5. Probably late subacute superior endplate compression fractures at L1, L2, and L4 (the L1 and L2 compressions were not present on 01/27/2019 and the L4 compression of the superior endplate was less striking on that exam). Posterior decompression at L4-L5-S1. IMPRESSION: 1. Confluent ground-glass opacities inferiorly in the right upper lobe and in the right middle lobe, with lesser degree of hazy ground-glass opacity posteriorly in the left upper lobe. Appearance favors atypical pneumonia over asymmetric edema. 2. Descending and sigmoid colon diverticulosis with mild proximal sigmoid colon diverticulitis. No extraluminal gas or abscess. 3. Small left and trace right pleural effusions. 4. Other imaging findings of potential clinical significance: Coronary atherosclerosis with mild cardiomegaly. Mild lobularity of the a hepatic contour could reflect early cirrhosis. Periampullary duodenal diverticulum. Trace free pelvic fluid, etiology uncertain. Descending and sigmoid colon diverticulosis. Multilevel compression fractures in the spine including T7, T8, T9, T11, T12, L1, L2, and L4. Posterior decompression at L4-L5-S1. Small free osteochondral fragments in the glenohumeral joints. Cardiomegaly. 5. Aortic atherosclerosis. Aortic Atherosclerosis (ICD10-I70.0).  Electronically Signed   By: Van Clines M.D.   On: 12/26/2019 16:01   US RENAL  Result Date: 12/28/2019 CLINICAL DATA:  Acute renal failure. EXAM: RENAL / URINARY TRACT ULTRASOUND COMPLETE COMPARISON:  None. FINDINGS: Right Kidney: Renal measurements: 11.1 x 4.4 x 4.9 cm = volume: 125.7 mL. Mild increased renal parenchymal echogenicity. Mild renal cortical thinning. Mild dilation of the renal pelvis without caliectasis. No masses or stones. Left Kidney: Renal measurements: 9.8 x 4.5 x 4.4 cm = volume: 102.0 mL. Mild increased renal parenchymal echogenicity. Upper pole cyst measuring 2.1 x 2.3 x 2.5 cm. No other masses, no stones and no hydronephrosis. Bladder: Appears normal for degree of bladder distention. Other: Right pleural effusion. IMPRESSION: 1. Mild dilation of the right renal pelvis without caliectasis. No convincing hydronephrosis. 2. Mild bilateral increased renal parenchymal echogenicity consistent with medical renal disease. 3. Left renal cyst. Electronically Signed   By: Lajean Manes M.D.   On: 12/28/2019 15:19   DG CHEST PORT 1 VIEW  Result Date: 12/28/2019 CLINICAL DATA:  Shortness of breath. EXAM: PORTABLE CHEST 1 VIEW COMPARISON:  Radiograph and CT 2 days ago FINDINGS: Stable cardiomegaly. Progression and right greater than left interstitial perihilar opacities suspicious for pulmonary edema. Increased retrocardiac opacity likely combination of pleural fluid and atelectasis/airspace disease. Small right pleural effusion. No pneumothorax. Vertebroplasty in the midthoracic spine. IMPRESSION: 1. Progression of right greater than left perihilar interstitial opacities, pulmonary edema versus atypical pneumonia. 2. Increased retrocardiac opacity likely combination of pleural fluid and atelectasis/airspace disease. Cardiomegaly is stable. Electronically Signed   By: Keith Rake M.D.   On: 12/28/2019 03:21   DG Chest Port 1 View  Result Date: 12/26/2019 CLINICAL DATA:  Generalized  weakness EXAM: PORTABLE CHEST 1 VIEW COMPARISON:  Chest radiograph 01/27/2019 FINDINGS: Monitoring leads overlie the patient. Stable enlarged cardiac and mediastinal contours. Heterogeneous opacities left lung base. Possible small left pleural effusion. No pneumothorax. Irregular opacity within the right upper lung. IMPRESSION: 1. Heterogeneous opacities left lung base may represent atelectasis or infection. 2. Irregular opacity within the right upper lung is nonspecific. Underlying pulmonary nodule/mass is not excluded. This needs dedicated evaluation with chest  CT. Electronically Signed   By: Lovey Newcomer M.D.   On: 12/26/2019 14:52   DG Abd Portable 2V  Result Date: 12/28/2019 CLINICAL DATA:  Abdominal pain EXAM: PORTABLE ABDOMEN - 2 VIEW COMPARISON:  CT from 12/26/2019 FINDINGS: Scattered large and small bowel gas is noted. Changes of diverticulosis are noted. No free air is seen. No abnormal mass or abnormal calcifications are noted. Postsurgical changes are seen. IMPRESSION: No acute abnormality noted. Electronically Signed   By: Inez Catalina M.D.   On: 12/28/2019 10:26   ECHOCARDIOGRAM COMPLETE  Result Date: 12/29/2019    ECHOCARDIOGRAM REPORT   Patient Name:   [name] [name] Grivas Date of Exam: 12/29/2019 Medical Rec #:  [id number]       Height:       66.0 in Accession #:    [id number]      Weight:       162.9 lb Date of Birth:  1941-05-17        BSA:          1.833 m Patient Age:    28 years        BP:           163/89 mmHg Patient Gender: F               HR:           80 bpm. Exam Location:  Inpatient Procedure: 2D Echo Indications:    Dyspnea R06.00  History:        Patient has prior history of Echocardiogram examinations, most                 recent 01/07/2015. Risk Factors:Hypertension and Former Smoker.  Sonographer:    Mikki Santee RDCS (AE) Referring Phys: [id number] [place]  1. Left ventricular ejection fraction, by estimation, is 60 to 65%. The left ventricle has normal function. The  left ventricle has no regional wall motion abnormalities. There is moderate left ventricular hypertrophy. Left ventricular diastolic parameters are consistent with Grade I diastolic dysfunction (impaired relaxation).  2. Right ventricular systolic function is normal. The right ventricular size is normal. Tricuspid regurgitation signal is inadequate for assessing PA pressure.  3. The mitral valve is normal in structure. Trivial mitral valve regurgitation.  4. The aortic valve was not well visualized. Aortic valve regurgitation is not visualized. No aortic stenosis is present.  5. Aortic dilatation noted. There is mild dilatation of the ascending aorta measuring 39 mm. FINDINGS  Left Ventricle: Left ventricular ejection fraction, by estimation, is 60 to 65%. The left ventricle has normal function. The left ventricle has no regional wall motion abnormalities. The left ventricular internal cavity size was normal in size. There is  moderate left ventricular hypertrophy. Left ventricular diastolic parameters are consistent with Grade I diastolic dysfunction (impaired relaxation). Right Ventricle: The right ventricular size is normal. Right vetricular wall thickness was not assessed. Right ventricular systolic function is normal. Tricuspid regurgitation signal is inadequate for assessing PA pressure. Left Atrium: Left atrial size was normal in size. Right Atrium: Right atrial size was normal in size. Pericardium: A small pericardial effusion is present. Presence of pericardial fat pad. Mitral Valve: The mitral valve is normal in structure. Trivial mitral valve regurgitation. Tricuspid Valve: The tricuspid valve is normal in structure. Tricuspid valve regurgitation is trivial. Aortic Valve: The aortic valve was not well visualized. Aortic valve regurgitation is not visualized. No aortic stenosis is present. Pulmonic Valve: The pulmonic valve was not well visualized.  Pulmonic valve regurgitation is trivial. Aorta: Aortic  dilatation noted. There is mild dilatation of the ascending aorta measuring 39 mm. IAS/Shunts: The interatrial septum was not well visualized. Additional Comments: There is pleural effusion in the left lateral region.  LEFT VENTRICLE PLAX 2D LVIDd:         4.60 cm  Diastology LVIDs:         2.90 cm  LV e' lateral:   5.44 cm/s LV PW:         1.30 cm  LV E/e' lateral: 11.9 LV IVS:        1.40 cm  LV e' medial:    5.11 cm/s LVOT diam:     2.30 cm  LV E/e' medial:  12.6 LV SV:         83 LV SV Index:   45 LVOT Area:     4.15 cm  RIGHT VENTRICLE RV S prime:     16.50 cm/s TAPSE (M-mode): 1.5 cm LEFT ATRIUM           Index       RIGHT ATRIUM           Index LA diam:      3.40 cm 1.85 cm/m  RA Area:     14.30 cm LA Vol (A2C): 40.3 ml 21.99 ml/m RA Volume:   30.80 ml  16.80 ml/m LA Vol (A4C): 65.9 ml 35.95 ml/m  AORTIC VALVE LVOT Vmax:   94.20 cm/s LVOT Vmean:  60.400 cm/s LVOT VTI:    0.199 m  AORTA Ao Root diam: 3.20 cm MITRAL VALVE MV Area (PHT): 4.71 cm     SHUNTS MV Decel Time: 161 msec     Systemic VTI:  0.20 m MV E velocity: 64.60 cm/s   Systemic Diam: 2.30 cm MV A velocity: 142.00 cm/s MV E/A ratio:  0.45 Oswaldo Milian MD Electronically signed by Oswaldo Milian MD Signature Date/Time: 12/29/2019/10:51:25 AM    Final    CT Renal Stone Study  Result Date: 12/26/2019 CLINICAL DATA:  Right upper lung opacity chest radiography for further characterization. Left lung opacities also noted. Altered mental status. EXAM: CT CHEST, ABDOMEN AND PELVIS WITHOUT CONTRAST TECHNIQUE: Multidetector CT imaging of the chest, abdomen and pelvis was performed following the standard protocol without IV contrast. COMPARISON:  Multiple exams, including chest radiograph from 12/26/2019 and CT abdomen from 01/27/2019 FINDINGS: CT CHEST FINDINGS Cardiovascular: Coronary, aortic arch, and branch vessel atherosclerotic vascular disease. Mild cardiomegaly. Mediastinum/Nodes: Unremarkable Lungs/Pleura: Scattered scarring in  the lungs. Confluent ground-glass opacities inferiorly in the right upper lobe and in the right middle lobe. Less degree of hazy ground-glass opacity posteriorly in the left upper lobe. Old granulomatous disease. Small left and trace right pleural effusion. Musculoskeletal: Small ossicles in the bicipital groove likely represent free osteochondral fragments in the right biceps recess of the glenohumeral joint. Similar findings on the left. Prior vertebral augmentations at T7 and T9. Age indeterminate endplate compression fractures at T8, T11, T12, and L1. CT ABDOMEN PELVIS FINDINGS Hepatobiliary: Mild lobularity of the a hepatic contour could reflect early cirrhosis. Cholecystectomy. No appreciable significant degree of biliary dilatation. Pancreas: Unremarkable Spleen: Unremarkable Adrenals/Urinary Tract: Both adrenal glands appear normal. Low-density lobularity from the left kidney upper pole, probably a cyst although technically nonspecific, measuring about 3.1 by 1.5 cm, previously measuring about 2.3 by 1.4 cm on 01/27/2019. No urinary tract calculi, hydronephrosis, or hydroureter. Urinary bladder unremarkable, without wall thickening. Stomach/Bowel: Periampullary duodenal diverticulum. Descending and sigmoid colon diverticulosis.  There is some stranding along the margin of the proximal sigmoid colon on image 87/4 suggesting mild acute diverticulitis. Vascular/Lymphatic: Aortoiliac atherosclerotic vascular disease. Reproductive: Uterus absent.  Adnexa unremarkable. Other: Trace free pelvic fluid, etiology uncertain. Musculoskeletal: Postoperative findings in the lower lumbar spine. Grade 1 degenerative anterolisthesis at L4-5. Probably late subacute superior endplate compression fractures at L1, L2, and L4 (the L1 and L2 compressions were not present on 01/27/2019 and the L4 compression of the superior endplate was less striking on that exam). Posterior decompression at L4-L5-S1. IMPRESSION: 1. Confluent  ground-glass opacities inferiorly in the right upper lobe and in the right middle lobe, with lesser degree of hazy ground-glass opacity posteriorly in the left upper lobe. Appearance favors atypical pneumonia over asymmetric edema. 2. Descending and sigmoid colon diverticulosis with mild proximal sigmoid colon diverticulitis. No extraluminal gas or abscess. 3. Small left and trace right pleural effusions. 4. Other imaging findings of potential clinical significance: Coronary atherosclerosis with mild cardiomegaly. Mild lobularity of the a hepatic contour could reflect early cirrhosis. Periampullary duodenal diverticulum. Trace free pelvic fluid, etiology uncertain. Descending and sigmoid colon diverticulosis. Multilevel compression fractures in the spine including T7, T8, T9, T11, T12, L1, L2, and L4. Posterior decompression at L4-L5-S1. Small free osteochondral fragments in the glenohumeral joints. Cardiomegaly. 5. Aortic atherosclerosis. Aortic Atherosclerosis (ICD10-I70.0). Electronically Signed   By: Van Clines M.D.   On: 12/26/2019 16:01       Subjective: Patient seen and examined at the bedside this morning.  Plan is to move her to residential hospice  Discharge Exam: Vitals:   12/30/19 0427 12/30/19 0828  BP: (!) 165/88 (!) 161/89  Pulse: 81 81  Resp: (!) 22 (!) 23  Temp: 98.2 F (36.8 C) 98.1 F (36.7 C)  SpO2: 95% 93%   Vitals:   12/30/19 0028 12/30/19 0427 12/30/19 0500 12/30/19 0828  BP: (!) 152/89 (!) 165/88  (!) 161/89  Pulse: 78 81  81  Resp: (!) 24 (!) 22  (!) 23  Temp: 97.6 F (36.4 C) 98.2 F (36.8 C)  98.1 F (36.7 C)  TempSrc: Oral Oral  Oral  SpO2: 94% 95%  93%  Weight:   74.1 kg   Height:        General: Pt is alert, awake,in moderate respiratory distress Cardiovascular: RRR, S1/S2 +, no rubs, no gallops Respiratory:Decreased air entry bilaterally Abdominal: Soft, NT, ND, bowel sounds + Extremities: B/L lower extremity edema    The results of  significant diagnostics from this hospitalization (including imaging, microbiology, ancillary and laboratory) are listed below for reference.     Microbiology: Recent Results (from the past 240 hour(s))  Urine culture     Status: None   Collection Time: 12/26/19  1:25 PM   Specimen: Urine, Random  Result Value Ref Range Status   Specimen Description   Final    URINE, RANDOM Performed at [facility] [address], [place], [place] [address]    Special Requests   Final    NONE Performed at [facility], [place] [address] [place], [place] [address]    Culture   Final    NO GROWTH Performed at [facility], [place] [address] [place], [place] [address]    Report Status 12/27/2019 FINAL  Final  Culture, blood (routine x 2)     Status: None (Preliminary result)   Collection Time: 12/26/19  1:25 PM   Specimen: BLOOD RIGHT FOREARM  Result Value Ref Range Status  Specimen Description   Final    BLOOD RIGHT FOREARM Performed at [facility] [address] [place], [place] [address]    Special Requests   Final    BOTTLES DRAWN AEROBIC AND ANAEROBIC Blood Culture results may not be optimal due to an inadequate volume of blood received in culture bottles Performed at [facility] [address], [place], [place] [address]    Culture   Final    NO GROWTH 4 DAYS Performed at [facility], [place] [address] [place], [place] [address]    Report Status PENDING  Incomplete  SARS Coronavirus 2 by RT PCR (hospital order, performed in [facility] hospital lab) Nasopharyngeal Nasopharyngeal Swab     Status: None   Collection Time: 12/26/19  1:28 PM   Specimen: Nasopharyngeal Swab  Result Value Ref Range Status   SARS Coronavirus 2 NEGATIVE NEGATIVE Final    Comment: (NOTE) SARS-CoV-2 target nucleic acids are NOT DETECTED. The SARS-CoV-2 RNA is generally detectable in upper and lower respiratory specimens  during the acute phase of infection. The lowest concentration of SARS-CoV-2 viral copies this assay can detect is 250 copies / mL. A negative result does not preclude SARS-CoV-2 infection and should not be used as the sole basis for treatment or other patient management decisions.  A negative result may occur with improper specimen collection / handling, submission of specimen other than nasopharyngeal swab, presence of viral mutation(s) within the areas targeted by this assay, and inadequate number of viral copies (<250 copies / mL). A negative result must be combined with clinical observations, patient history, and epidemiological information. Fact Sheet for Patients:   StrictlyIdeas.no Fact Sheet for Healthcare Providers: BankingDealers.co.za This test is not yet approved or cleared  by the [place] FDA and has been authorized for detection and/or diagnosis of SARS-CoV-2 by FDA under an Emergency Use Authorization (EUA).  This EUA will remain in effect (meaning this test can be used) for the duration of the COVID-19 declaration under Section 564(b)(1) of the Act, 21 U.S.C. section 360bbb-3(b)(1), unless the authorization is terminated or revoked sooner. Performed at [facility], [place] [address], [place], [place] [address]   Culture, blood (routine x 2)     Status: None (Preliminary result)   Collection Time: 12/26/19  1:30 PM   Specimen: BLOOD LEFT FOREARM  Result Value Ref Range Status   Specimen Description   Final    BLOOD LEFT FOREARM Performed at [place] [address] [place], [place] [address]    Special Requests   Final    BOTTLES DRAWN AEROBIC AND ANAEROBIC Blood Culture adequate volume Performed at [place] [address], [place], [place] [address]    Culture   Final    NO GROWTH 4 DAYS Performed at [facility], [place] [address],  [place], [place] [address]    Report Status PENDING  Incomplete     Labs: BNP (last 3 results) Recent Labs    12/28/19 0536  BNP 681.1*   Basic Metabolic Panel: Recent Labs  Lab 12/27/19 0406 12/28/19 0536 12/28/19 1335 12/29/19 0602 12/30/19 0546  NA 142 142 142 142 138  K 3.7 3.8 4.5 3.4* 3.4*  CL 110 110 109 106 104  CO2 22 21* 20* 21* 22  GLUCOSE 79 94 131* 74 99  BUN 60* 61* 59* 56* 53*  CREATININE 2.76* 2.89* 3.02* 2.94* 2.95*  CALCIUM 7.8* 8.0* 8.0*  8.2* 8.5*  MG  --  1.5*  --  1.4* 2.0   Liver Function Tests: Recent Labs  Lab 12/26/19 1324 12/27/19 0406 12/28/19 0536 12/29/19 0602 12/30/19 0546  AST 17 14* 16 16 18   ALT 11 10 10 10 10   ALKPHOS 65 55 59 53 54  BILITOT 0.7 0.4 0.6 0.6 0.5  PROT 6.9 6.1* 6.5 5.9* 6.5  ALBUMIN 3.3* 3.0* 3.1* 2.9* 3.1*   No results for input(s): LIPASE, AMYLASE in the last 168 hours. No results for input(s): AMMONIA in the last 168 hours. CBC: Recent Labs  Lab 12/26/19 1324 12/26/19 1324 12/26/19 1908 12/27/19 0406 12/28/19 0536 12/29/19 0602 12/30/19 0546  WBC 20.8*   < > 15.5* 13.1* 22.3* 19.1* 24.9*  NEUTROABS 19.9*  --   --   --  18.1* 15.4* 21.7*  HGB 10.5*   < > 9.3* 9.0* 9.3* 8.4* 9.0*  HCT 31.8*   < > 28.5* 27.5* 28.9* 26.7* 27.7*  MCV 99.1   < > 100.0 99.6 101.4* 101.5* 100.7*  PLT 422*   < > 364 359 404* 348 379   < > = values in this interval not displayed.   Cardiac Enzymes: No results for input(s): CKTOTAL, CKMB, CKMBINDEX, TROPONINI in the last 168 hours. BNP: Invalid input(s): POCBNP CBG: No results for input(s): GLUCAP in the last 168 hours. D-Dimer No results for input(s): DDIMER in the last 72 hours. Hgb A1c No results for input(s): HGBA1C in the last 72 hours. Lipid Profile No results for input(s): CHOL, HDL, LDLCALC, TRIG, CHOLHDL, LDLDIRECT in the last 72 hours. Thyroid function studies No results for input(s): TSH, T4TOTAL, T3FREE, THYROIDAB in the last 72 hours.  Invalid input(s):  FREET3 Anemia work up No results for input(s): VITAMINB12, FOLATE, FERRITIN, TIBC, IRON, RETICCTPCT in the last 72 hours. Urinalysis    Component Value Date/Time   COLORURINE YELLOW 12/26/2019 1325   APPEARANCEUR CLEAR 12/26/2019 1325   LABSPEC 1.009 12/26/2019 1325   PHURINE 6.0 12/26/2019 1325   GLUCOSEU NEGATIVE 12/26/2019 1325   HGBUR LARGE (A) 12/26/2019 1325   BILIRUBINUR NEGATIVE 12/26/2019 1325   KETONESUR NEGATIVE 12/26/2019 1325   PROTEINUR 100 (A) 12/26/2019 1325   UROBILINOGEN 0.2 12/13/2014 1506   NITRITE NEGATIVE 12/26/2019 1325   LEUKOCYTESUR NEGATIVE 12/26/2019 1325   Sepsis Labs Invalid input(s): PROCALCITONIN,  WBC,  LACTICIDVEN Microbiology Recent Results (from the past 240 hour(s))  Urine culture     Status: None   Collection Time: 12/26/19  1:25 PM   Specimen: Urine, Random  Result Value Ref Range Status   Specimen Description   Final    URINE, RANDOM Performed at [facility], [place] [address] [place], [place] [address]    Special Requests   Final    NONE Performed at [facility], [place] [address], [place], [place] [address]    Culture   Final    NO GROWTH Performed at [facility], [place] [address], [place], [place] [address]    Report Status 12/27/2019 FINAL  Final  Culture, blood (routine x 2)     Status: None (Preliminary result)   Collection Time: 12/26/19  1:25 PM   Specimen: BLOOD RIGHT FOREARM  Result Value Ref Range Status   Specimen Description   Final    BLOOD RIGHT FOREARM Performed at [place] [facility] [address], [place], [place] [address]    Special Requests   Final    BOTTLES DRAWN AEROBIC AND  ANAEROBIC Blood Culture results may not be optimal due to an inadequate volume of blood received in culture bottles Performed at [facility], [place] [address] [place], [place] [address]    Culture   Final    NO GROWTH 4 DAYS Performed at [place], [place] [address] [place], [place] [address]    Report Status PENDING  Incomplete  SARS Coronavirus 2 by RT PCR (hospital order, performed in [facility] hospital lab) Nasopharyngeal Nasopharyngeal Swab     Status: None   Collection Time: 12/26/19  1:28 PM   Specimen: Nasopharyngeal Swab  Result Value Ref Range Status   SARS Coronavirus 2 NEGATIVE NEGATIVE Final    Comment: (NOTE) SARS-CoV-2 target nucleic acids are NOT DETECTED. The SARS-CoV-2 RNA is generally detectable in upper and lower respiratory specimens during the acute phase of infection. The lowest concentration of SARS-CoV-2 viral copies this assay can detect is 250 copies / mL. A negative result does not preclude SARS-CoV-2 infection and should not be used as the sole basis for treatment or other patient management decisions.  A negative result may occur with improper specimen collection / handling, submission of specimen other than nasopharyngeal swab, presence of viral mutation(s) within the areas targeted by this assay, and inadequate number of viral copies (<250 copies / mL). A negative result must be combined with clinical observations, patient history, and epidemiological information. Fact Sheet for Patients:   StrictlyIdeas.no Fact Sheet for Healthcare Providers: BankingDealers.co.za This test is not yet approved or cleared  by the [place] FDA and has been authorized for detection and/or diagnosis of SARS-CoV-2 by FDA under an Emergency Use Authorization (EUA).  This EUA will remain in effect (meaning this test can be used) for the duration of the COVID-19 declaration under Section 564(b)(1) of the Act, 21 U.S.C. section 360bbb-3(b)(1), unless the authorization is terminated or revoked sooner. Performed at [facility], [place] [address] [place], [place] [address]   Culture, blood (routine x 2)     Status: None (Preliminary result)   Collection  Time: 12/26/19  1:30 PM   Specimen: BLOOD LEFT FOREARM  Result Value Ref Range Status   Specimen Description   Final    BLOOD LEFT FOREARM Performed at [facility] [address] [place], [place] [address]    Special Requests   Final    BOTTLES DRAWN AEROBIC AND ANAEROBIC Blood Culture adequate volume Performed at [facility] [address] [place], [place] [address]    Culture   Final    NO GROWTH 4 DAYS Performed at [facility], [place] [address] [place],  [address]    Report Status PENDING  Incomplete    Please note: You were cared for by a hospitalist during your hospital stay. Once you are discharged, your primary care physician will handle any further medical issues. Please note that NO REFILLS for any discharge medications will be authorized once you are discharged, as it is imperative that you return to your primary care physician (or establish a relationship with a primary care physician if you do not have one) for your post hospital discharge needs so that they can reassess your need for medications and monitor your lab values.    Time coordinating discharge: 40 minutes  SIGNED:   Shelly Coss, MD  [facility] 12/30/2019, 11:25 AM Pager [phone]  If 7PM-7AM, please contact night-coverage [email or web address] Password [email or web address]

## 2019-12-30 NOTE — Progress Notes (Signed)
Daily Progress Note   Patient Name: [name] [name]       Date: 12/30/2019 DOB: [birth year]  Age: 79 y.o. MRN#: [id number] Attending Physician: Shelly Coss, MD Primary Care Physician: Hoyt Koch, MD Admit Date: 12/26/2019  Reason for Consultation/Follow-up: Establishing goals of care  Subjective: Patient awake but confused, tells me she feels awful, having shortness of breath, asking to go home. Breakfast at bedside untouched  Length of Stay: 4  Current Medications: Scheduled Meds:  . furosemide  80 mg Intravenous Q8H  . mirtazapine  15 mg Oral QHS  . predniSONE  20 mg Oral Q breakfast  . ursodiol  300 mg Oral BID    Continuous Infusions: . sodium chloride 250 mL (12/30/19 0611)    PRN Meds: sodium chloride, acetaminophen **OR** acetaminophen, antiseptic oral rinse, haloperidol **OR** haloperidol **OR** haloperidol lactate, HYDROmorphone (DILAUDID) injection, ondansetron **OR** ondansetron (ZOFRAN) IV, oxyCODONE, senna-docusate, sodium chloride  Physical Exam Constitutional:      Comments: confused  Pulmonary:     Comments: Increased work of breathing, tachypnea Skin:    General: Skin is warm and dry.  Neurological:     Mental Status: She is alert. She is disoriented.             Vital Signs: BP (!) 161/89 (BP Location: Right Arm)   Pulse 81   Temp 98.1 F (36.7 C) (Oral)   Resp (!) 23   Ht 5' 6"  (1.676 m)   Wt 74.1 kg   SpO2 93%   BMI 26.37 kg/m  SpO2: SpO2: 93 % O2 Device: O2 Device: NRB O2 Flow Rate: O2 Flow Rate (L/min): 15 L/min  Intake/output summary:   Intake/Output Summary (Last 24 hours) at 12/30/2019 1015 Last data filed at 12/29/2019 1622 Gross per 24 hour  Intake 200 ml  Output 700 ml  Net -500 ml   LBM: Last BM Date: 12/29/19 Baseline Weight:  Weight: 74.6 kg Most recent weight: Weight: 74.1 kg       Palliative Assessment/Data: PPS 20%    Flowsheet Rows     Most Recent Value  Intake Tab  Referral Department  Hospitalist  Unit at Time of Referral  Cardiac/Telemetry Unit  Palliative Care Primary Diagnosis  Pulmonary  Date Notified  12/28/19  Palliative Care Type  New Palliative care  Reason for referral  Clarify Goals of Care  Date of Admission  12/26/19  Date first seen by Palliative Care  12/29/19  # of days Palliative referral response time  1 Day(s)  # of days IP prior to Palliative referral  2  Clinical Assessment  Palliative Performance Scale Score  20%  Psychosocial & Spiritual Assessment  Palliative Care Outcomes  Patient/Family meeting held?  Yes  Who was at the meeting?  son  Palliative Care Outcomes  Clarified goals of care, Counseled regarding hospice, Provided psychosocial or spiritual support      Patient Active Problem List   Diagnosis Date Noted  . Acute respiratory failure with hypoxia ([place])   . Goals of care, counseling/discussion   . Palliative care by specialist   . DNR (do not resuscitate)   . Acute metabolic encephalopathy 40/98/1191  . Intractable back pain 06/03/2019  .  Lumbar compression fracture, closed, initial encounter ([place]) 06/02/2019  . Thoracic compression fracture ([place]) 06/02/2019  . CKD (chronic kidney disease) 06/02/2019  . 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 Severe major depression ([place]) 03/03/2019  . 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 Venous insufficiency 01/28/2018  . Routine general medical examination at a health care facility 01/10/2018  . Iron deficiency anemia due to chronic blood loss 12/18/2017  . Thrombocytosis ([place]) 12/11/2017  . Dementia ([place]) 11/01/2017  . Lumbar radiculopathy 12/31/2016  . Neck pain 03/05/2016  . Cervical paraspinal muscle spasm 03/01/2016  . H/O Bell's palsy 01/09/2016  . 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    . Cough variant asthma vs uacs vs bronchiectasis 01/14/2015  . Hyperglycemia 10/11/2014  . Esophageal reflux 09/22/2014  . Long term (current) use of systemic steroids-prednisone 08/25/2012  . 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 Immunosuppression - on chronic prednisone, hx of Remicade and azathioprine 07/09/2011  . Crohn's disease of colon with rectal bleeding ([place])   . Hypertension   . Arthritis     Palliative Care Assessment & Plan   HPI: 79 y.o. female  with past medical history of dementia, Chron's, GERD, HTN, CKD, depression, and arthritis admitted on 12/26/2019 with weakness and poor PO intake. Patient treated for sepsis d/t CAP vs sigmoid diverticulitis. Patient also with acute respiratory failure - CXR reveals left basilar consolidation and pulmonary edema. Condition has worsened throughout hospitalization and PMT consulted to discuss Venturia.   Assessment: Follow up call to patient's son [name] - discussed that patient has not improved, possibly worse. Discussed options of continued care vs comfort care/hospice facility. [name] agrees that focusing on comfort is in line with goals of care. We discussed utilizing small dose of opioids for dyspnea and he agrees. Discussed option of transport to hospice home - [facility] requests hospice in [place]. Discussed maintaining oxygen mask until she goes to hospice as I fear she will not tolerate transport without it - [place] agrees.   Discussed above with Randall Hiss, CSW and Dr. Tawanna Solo who agree.   Recommendations/Plan:  Comfort measures- maintain oxygen mask for transfer to hospice home  Low dose dilaudid added for dyspnea - avoid morphine in renal failure  Signed DNR placed on chart  Goals of Care and Additional Recommendations:  Limitations on Scope of Treatment: Full Comfort Care  Code Status:  DNR  Prognosis:   < 2 weeks  Discharge Planning:  Hospice facility  Care plan was discussed with patient's son, RN, CSW, Dr.  Tawanna Solo  Thank you for allowing the Palliative Medicine Team to assist in the care of this patient.   Total Time 25 minutes Prolonged Time Billed  no       Greater than 50%  of this time was spent counseling and coordinating care related to the above assessment and plan.  Juel Burrow, DNP, [facility] Palliative Medicine Team Team Phone # [phone]  Pager [phone]

## 2019-12-30 NOTE — TOC Initial Note (Addendum)
Transition of Care [facility]) - Initial/Assessment Note    Patient Details  Name: [name] [name] MRN: [id number] Date of Birth: November 09, 1940  Transition of Care [facility]) CM/SW Contact:    Ross Ludwig, LCSW Phone Number: 12/30/2019, 10:37 AM  Clinical Narrative:                  CSW was informed by Ok Edwards in Palliative that patient's family is interested in hospice facility in [place].  CSW contacted patient's sons [name] and Beatris Si is the main contact and asked where their preference is son said Hospice facility in [name].  CSW contaced Cheri from [facility], she said they do have beds available, and she will review patient to see if she is eligible for the hospice facility.  CSW awaiting for call back from [facility], to find out if patient is accepted.  12:30pm  CSW received phone call from [facility] that patient can be accepted to [facility] hospice facility per family request.  Expected Discharge Plan: [place] Barriers to Discharge: Continued Medical Work up   Patient Goals and CMS Choice Patient states their goals for this hospitalization and ongoing recovery are:: Patient's son would like patient to [facility]. CMS Medicare.gov Compare Post Acute Care list provided to:: Patient Represenative (must comment) Choice offered to / list presented to : Adult Children  Expected Discharge Plan and Services Expected Discharge Plan: [place] In-house Referral: Clinical Social Work, Hospice / [place] Acute Care Choice: Hospice Living arrangements for the past 2 months: Single Family Home                                      Prior Living Arrangements/Services Living arrangements for the past 2 months: Single Family Home Lives with:: Adult Children Patient language and need for interpreter reviewed:: Yes Do you feel safe going back to the place where you live?: No    Patient's son does not feel like he can continue to take care of patient.  Need for Family Participation in Patient Care: Yes (Comment) Care giver support system in place?: No (comment) Current home services: Home OT, Home PT, Home RN, Homehealth aide Criminal Activity/Legal Involvement Pertinent to Current Situation/Hospitalization: No - Comment as needed  Activities of Daily Living Home Assistive Devices/Equipment: Cane (specify quad or straight), Built-in shower seat, Dentures (specify type), Eyeglasses, Grab bars in shower(straight cane, front wheel walker) ADL Screening (condition at time of admission) Patient's cognitive ability adequate to safely complete daily activities?: Yes Is the patient deaf or have difficulty hearing?: No Does the patient have difficulty seeing, even when wearing glasses/contacts?: No Does the patient have difficulty concentrating, remembering, or making decisions?: Yes Patient able to express need for assistance with ADLs?: Yes Does the patient have difficulty dressing or bathing?: Yes Independently performs ADLs?: Yes (appropriate for developmental age) Does the patient have difficulty walking or climbing stairs?: Yes Weakness of Legs: Both Weakness of Arms/Hands: Both  Permission Sought/Granted Permission sought to share information with : Facility Sport and exercise psychologist, Family Supports Permission granted to share information with : Yes, Release of Information Signed  Share Information with NAME: [place], [place] [phone]  [phone]  Permission granted to share info w AGENCY: Hospice Agency        Emotional Assessment Appearance:: Appears stated age Attitude/Demeanor/Rapport: Unresponsive Affect (typically observed):  Accepting, Appropriate, Calm Orientation: : Oriented to Self, Oriented to Place, Oriented to  Time Alcohol / Substance Use: Not Applicable Psych Involvement: No (comment)  Admission diagnosis:  Hypokalemia [E87.6] Guaiac  positive stools [R19.5] AKI (acute kidney injury) ([place]) [N17.9] Acute diverticulitis [F09.32] Acute metabolic encephalopathy [T55.73] Community acquired pneumonia, unspecified laterality [J18.9] Patient Active Problem List   Diagnosis Date Noted  . Comfort measures only status   . Acute respiratory failure with hypoxia ([place])   . Goals of care, counseling/discussion   . Palliative care by specialist   . DNR (do not resuscitate)   . Acute metabolic encephalopathy 22/08/5425  . Intractable back pain 06/03/2019  . Lumbar compression fracture, closed, initial encounter ([place]) 06/02/2019  . Thoracic compression fracture ([place]) 06/02/2019  . CKD (chronic kidney disease) 06/02/2019  . 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 Severe major depression ([place]) 03/03/2019  . 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 Venous insufficiency 01/28/2018  . Routine general medical examination at a health care facility 01/10/2018  . Iron deficiency anemia due to chronic blood loss 12/18/2017  . Thrombocytosis ([place]) 12/11/2017  . Dementia ([place]) 11/01/2017  . Lumbar radiculopathy 12/31/2016  . Neck pain 03/05/2016  . Cervical paraspinal muscle spasm 03/01/2016  . H/O Bell's palsy 01/09/2016  . 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 Cough variant asthma vs uacs vs bronchiectasis 01/14/2015  . Hyperglycemia 10/11/2014  . Esophageal reflux 09/22/2014  . Long term (current) use of systemic steroids-prednisone 08/25/2012  . 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 Immunosuppression - on chronic prednisone, hx of Remicade and azathioprine 07/09/2011  . Crohn's disease of colon with rectal bleeding ([place])   . Hypertension   . Arthritis    PCP:  Hoyt Koch, MD Pharmacy:   [id number] [place], [place] - [address] [facility] [address]Phone: [phone]: [phone]    Social Determinants of Health (SDOH) Interventions    Readmission  Risk Interventions No flowsheet data found.

## 2019-12-30 NOTE — Consult Note (Signed)
   [facility] Inpatient Consult   12/30/2019  [name] [name] 02-Nov-1940 [id number]   Patient chart has been reviewed noting active status with [facility] outpatient services. Per Charity fundraiser, patient not currently active for chronic disease management follow up. Per chart review, current disposition plan is for hospice services. No [facility] CM needs.  Netta Cedars, MSN, [facility] Liaison Nurse Mobile Phone [phone]  Toll free office [phone]

## 2019-12-30 NOTE — Progress Notes (Signed)
Patient transported to hospice facility via ambulance, alert, in no distress. Report given to facility earlier prior to transportation.

## 2019-12-31 ENCOUNTER — Telehealth: Payer: Self-pay | Admitting: *Deleted

## 2019-12-31 LAB — CULTURE, BLOOD (ROUTINE X 2)
Culture: NO GROWTH
Culture: NO GROWTH
Special Requests: ADEQUATE

## 2019-12-31 NOTE — Telephone Encounter (Signed)
Pt was on TCM report admitted 12/26/19 for generalized weakness, poor oral intake and pain all over. Pt had fever of 101.2. In the ED, patient had leukocytosis with slightly elevated creatinine from baseline along with positive fecal occult blood. Imaging was notable for possible sigmoid diverticulitis along with pneumonia. Due to her advanced age and comorbidities, palliative care was consulted for goals of care.  Since her respiratory status is worsening, family discussion was held and the plan is to transfer to residential hospice. Pt D/C 12/30/19 to hospice.Marland KitchenJohny Chess

## 2020-01-08 ENCOUNTER — Telehealth: Payer: Self-pay | Admitting: Internal Medicine

## 2020-01-08 NOTE — Telephone Encounter (Signed)
    Misty from [facility] reporting patient died  January 15, 2020

## 2020-01-11 ENCOUNTER — Telehealth: Payer: Self-pay | Admitting: Internal Medicine

## 2020-01-11 NOTE — Telephone Encounter (Signed)
FYI Dr Carlean Purl

## 2020-01-12 NOTE — Telephone Encounter (Signed)
I called [name] [name] and expressed my sympathy for loss of his mother

## 2020-01-21 DEATH — deceased

## 2020-02-18 ENCOUNTER — Ambulatory Visit: Payer: Medicare Other | Admitting: Neurology

## 2021-01-12 IMAGING — CR THORACIC SPINE 2 VIEWS
5 series · 5 of 5 positions shown · non-contrast
Comparison: Thoracic MRI, 04/26/2017

CLINICAL DATA: Patient reports a fall yesterday when her "legs gave
out." No complaint of back pain, c/o abdomen pain.

EXAM:
THORACIC SPINE 2 VIEWS

[t thoracic spine ap]
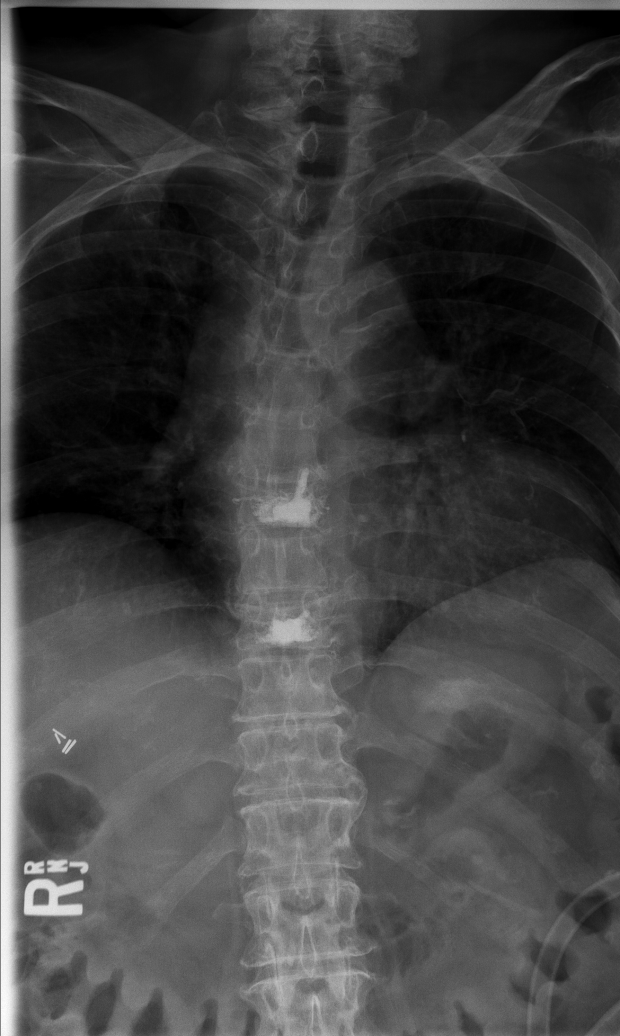

[t thoracic spine lat (1 of 2)]
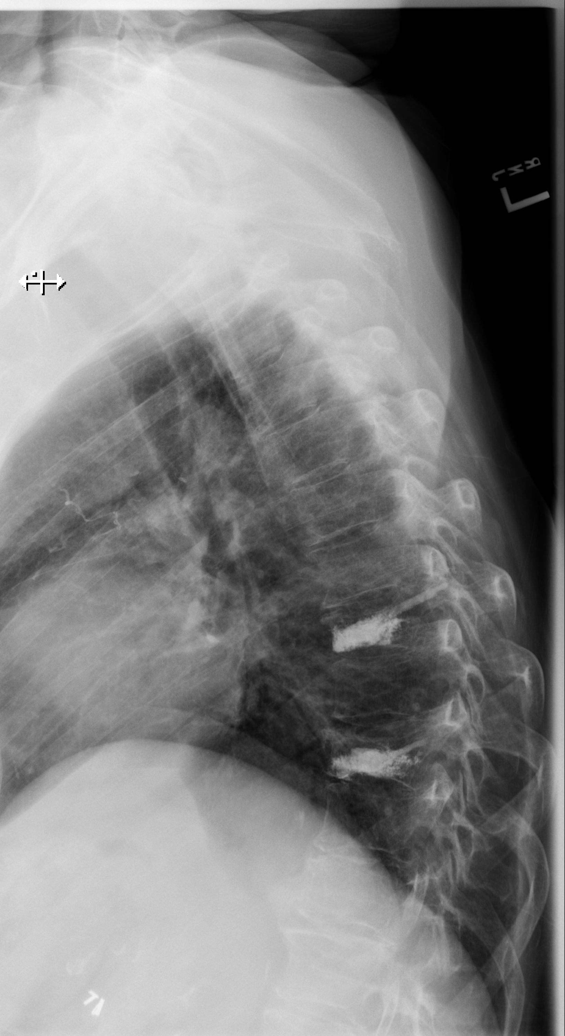

[t thoracic spine lat (2 of 2)]
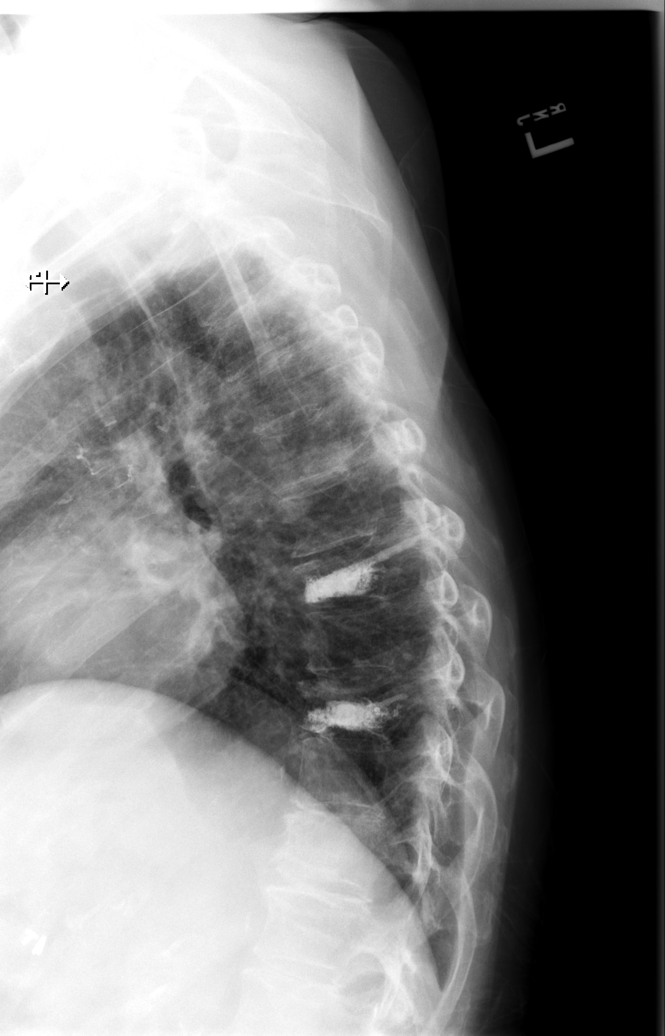

[t thoracic swimmers]
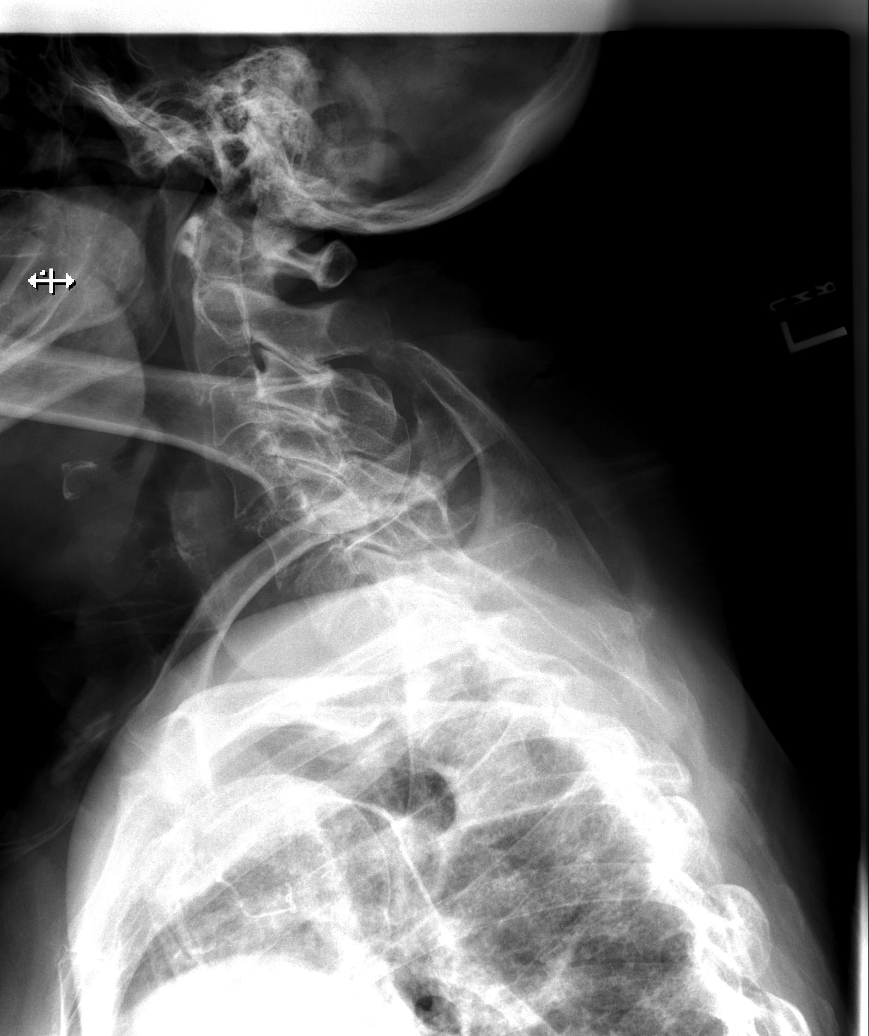

[t thoracic breathing lat]
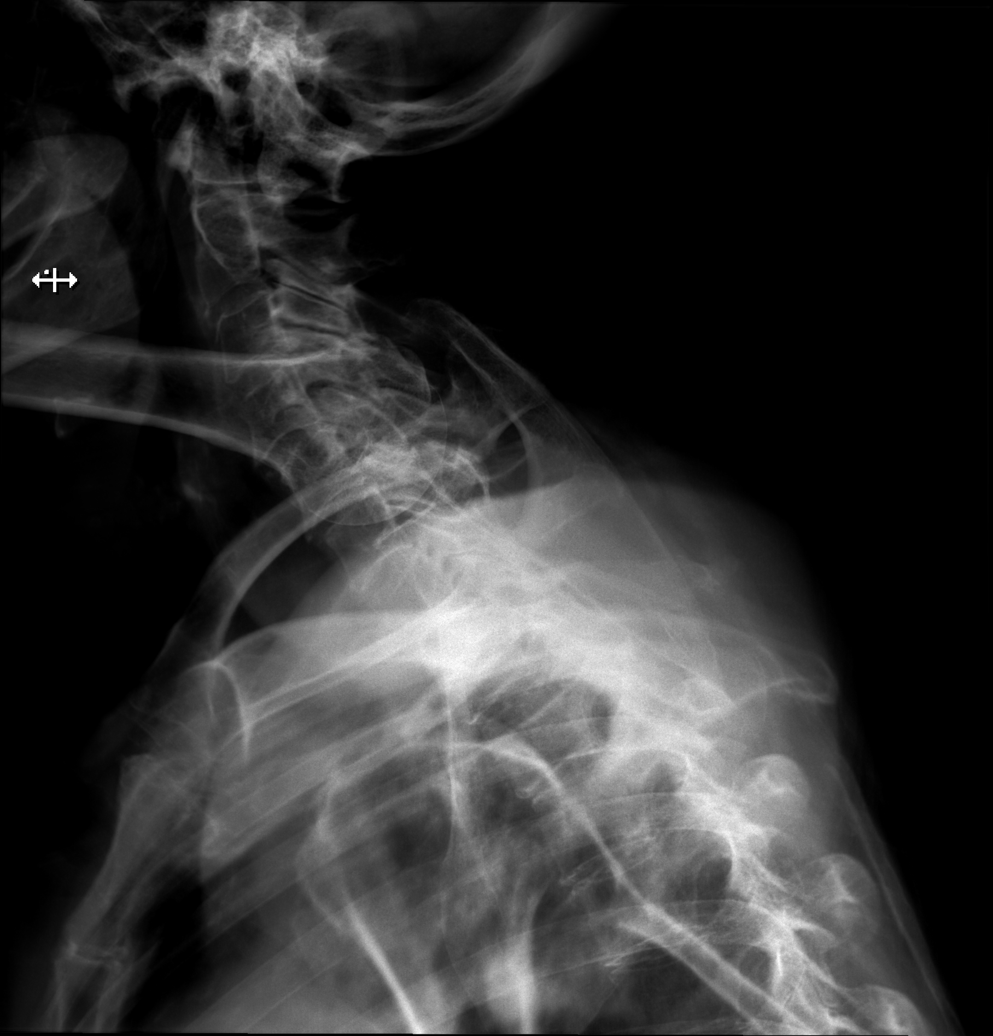

[5 of 5 positions shown; findings below may reference images not displayed]

FINDINGS: No acute fracture. There are fractures of T7 and T9, treated with
vertebroplasty, performed since the prior MRI. The fractures are
stable in severity from the prior MRI. No other fractures.

No bone lesions.  No spondylolisthesis.

There are mild disc degenerative changes most evident along the mid
to lower thoracic spine reflected by mild loss of disc height and
small endplate osteophytes.

The skeletal structures are diffusely demineralized.

Soft tissues are unremarkable.
IMPRESSION: 1. No acute findings.
2. Old treated fractures of T7 and T9.  No other fractures.
3. Mild degenerative changes.

## 2021-01-12 IMAGING — CT CT ABDOMEN AND PELVIS WITHOUT CONTRAST
2 of 4 series · 16 of 46 positions shown, 18 images · non-contrast
Comparison: None.

CLINICAL DATA: Lower abdominal pain for 4-5 days.

EXAM:
CT ABDOMEN AND PELVIS WITHOUT CONTRAST
TECHNIQUE: Multidetector CT imaging of the abdomen and pelvis was performed
following the standard protocol without IV contrast.

[Series 2: axial st · axial · 0.86mm/px · z∈[-207,+238]mm · 13 of 99 slices shown, 15 images]
[im 5/99  soft-tissue]
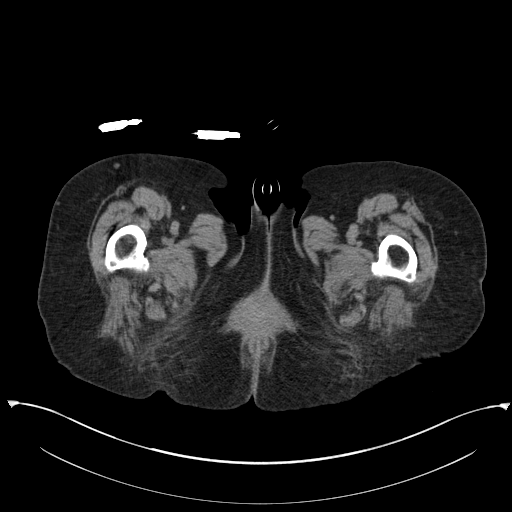
[im 5/99  bone]
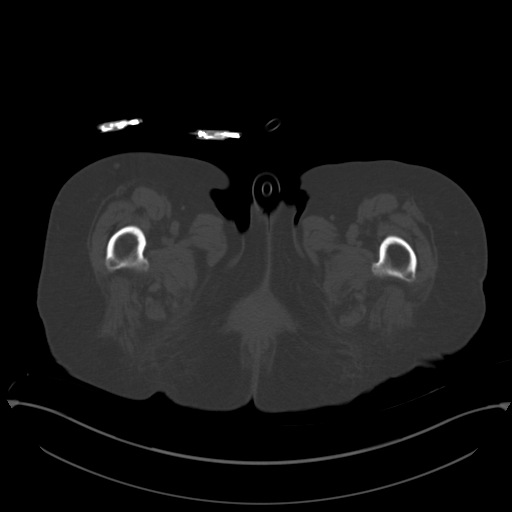
[im 15/99  soft-tissue]
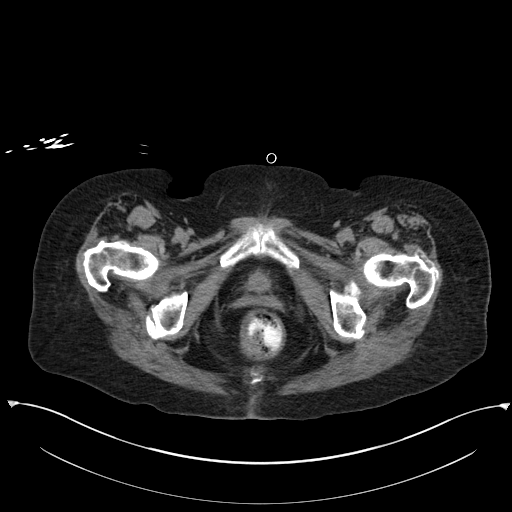
[im 20/99  soft-tissue]
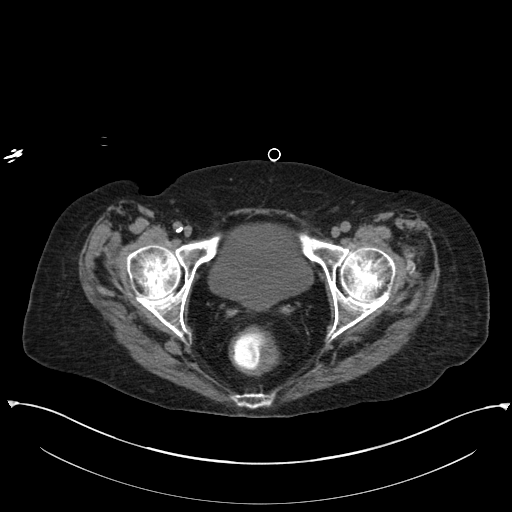
[im 30/99  soft-tissue]
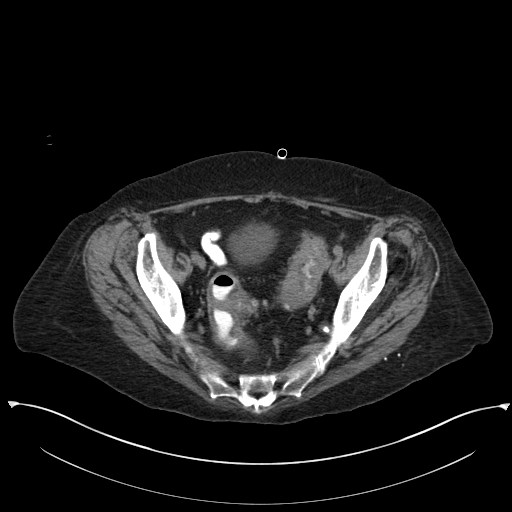
[im 35/99  soft-tissue]
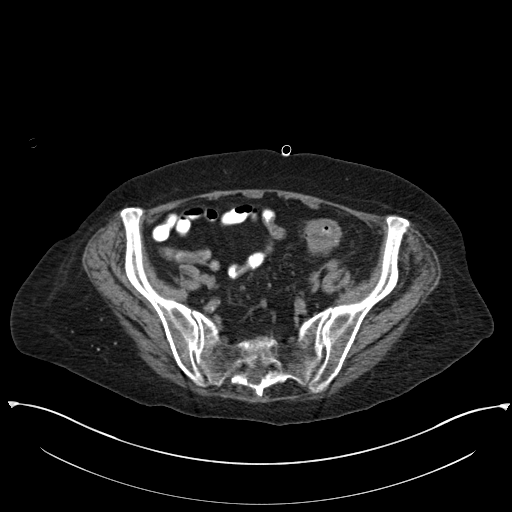
[im 45/99  soft-tissue]
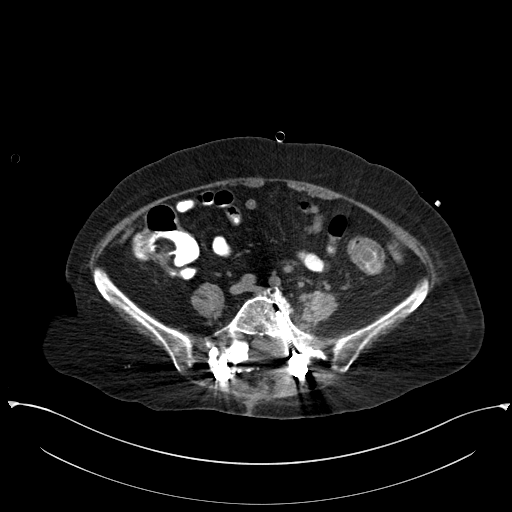
[im 50/99  soft-tissue]
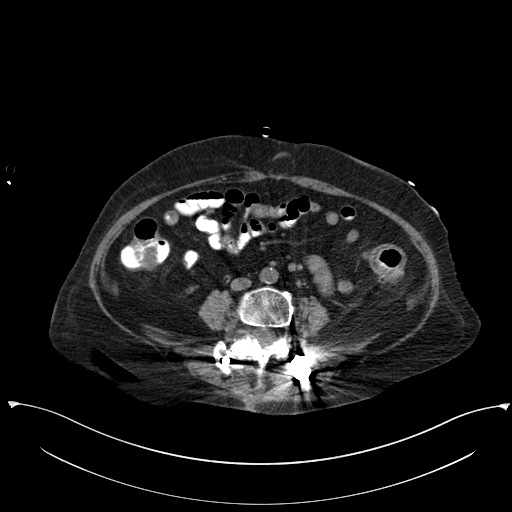
[im 54/99  soft-tissue]
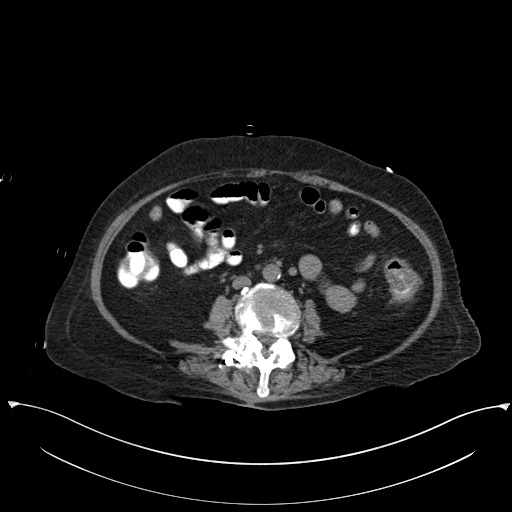
[im 64/99  soft-tissue]
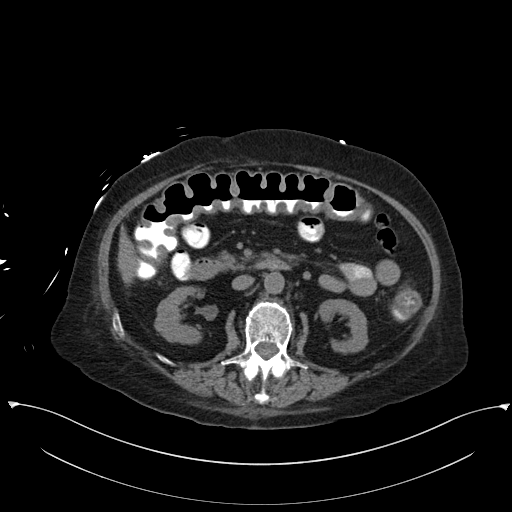
[im 64/99  bone]
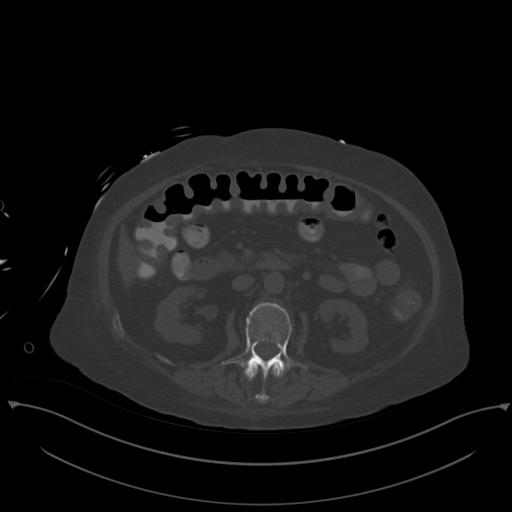
[im 69/99  soft-tissue]
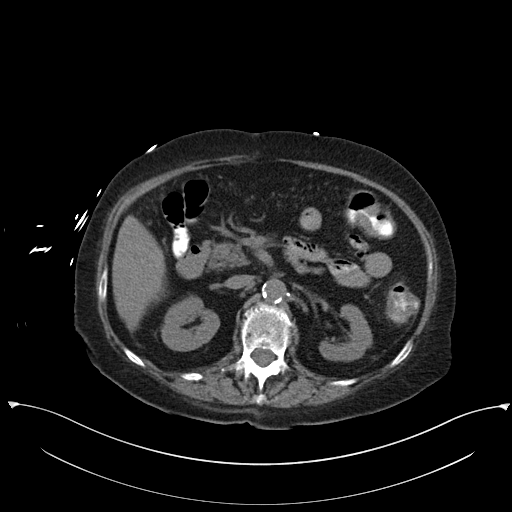
[im 79/99  soft-tissue]
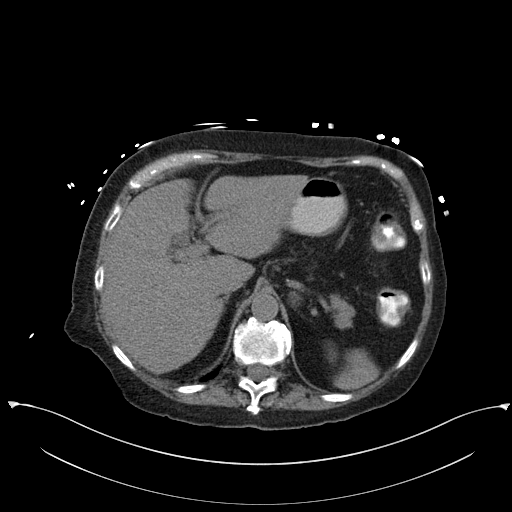
[im 84/99  soft-tissue]
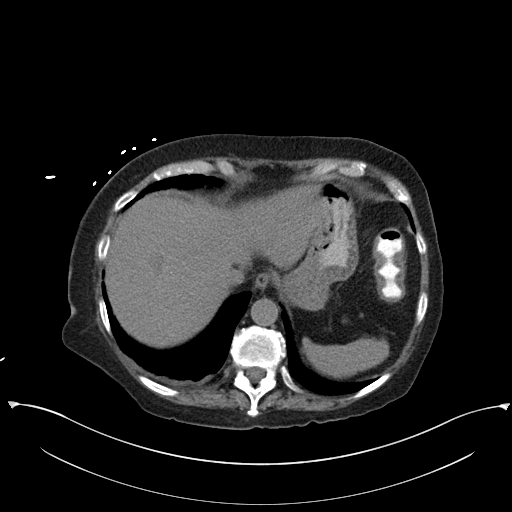
[im 94/99  soft-tissue]
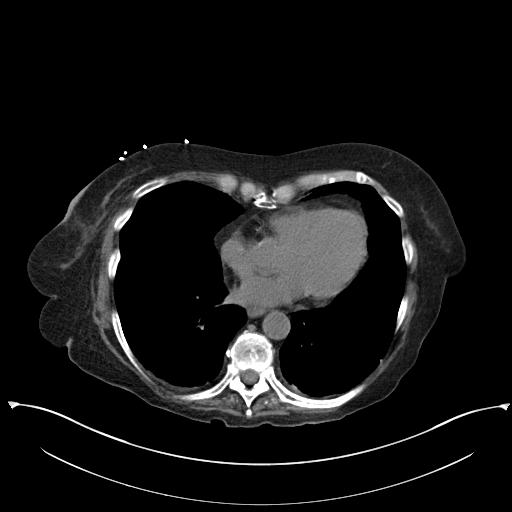

[Series 5: coronal st · coronal · 0.76mm/px · 3 of 135 slices shown]
[im 45/135  soft-tissue]
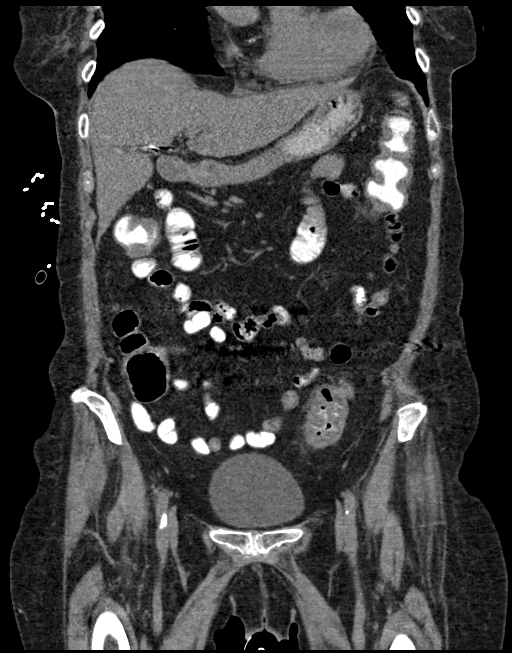
[im 60/135  soft-tissue]
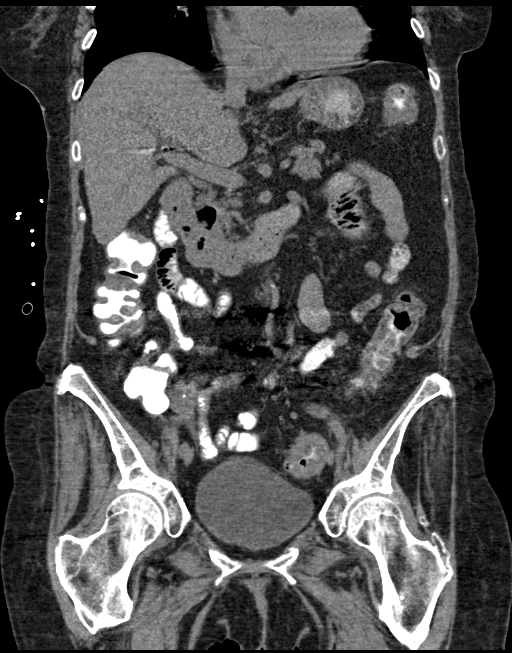
[im 75/135  soft-tissue]
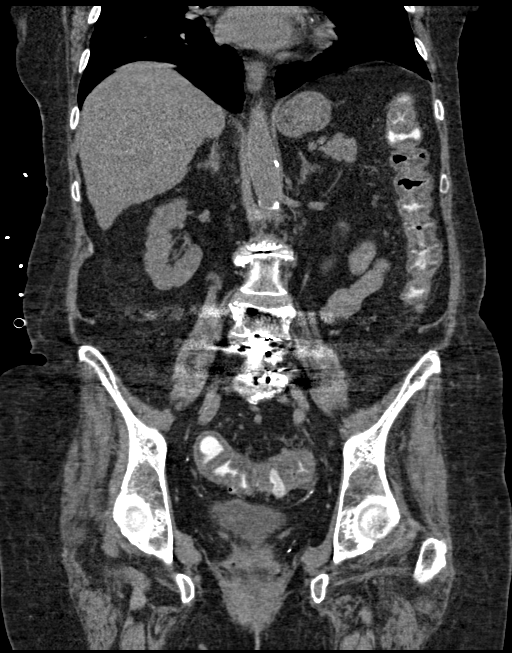

[16 of 46 positions shown; findings below may reference images not displayed]

FINDINGS: Lower chest: No acute abnormality. Calcific atherosclerotic disease
of the coronary arteries and aorta.

Hepatobiliary: No focal liver abnormality is seen. Status post
cholecystectomy. No biliary dilatation.

Pancreas: Unremarkable. No pancreatic ductal dilatation or
surrounding inflammatory changes.

Spleen: Normal in size without focal abnormality.

Adrenals/Urinary Tract: Adrenal glands are unremarkable. Kidneys are
normal, without renal calculi, focal lesion, or hydronephrosis.
Bladder is unremarkable.

Stomach/Bowel: Stomach is within normal limits. Appendix appears
normal. No evidence of small bowel wall thickening, distention, or
inflammatory changes. Circumferential mucosal thickening of the
descending and sigmoid colon with mild pericolonic inflammatory
changes. Scatter left colonic diverticula. Milder mucosal thickening
of the transverse colon.

Vascular/Lymphatic: Aortic atherosclerosis. No enlarged abdominal or
pelvic lymph nodes.

Reproductive: Status post hysterectomy. No adnexal masses.

Other: No abdominal wall hernia or abnormality. No abdominopelvic
ascites.

Musculoskeletal: Lower lumbosacral spine fusion with intact
hardware. Spondylosis the lumbosacral spine. Prior T9 kyphoplasty.
IMPRESSION: Descending and sigmoid colon colitis, either infectious or
inflammatory.

Milder inflammatory changes of the transverse colon.

Calcific atherosclerotic disease of the aorta.
